# Patient Record
Sex: Male | Born: 1940 | Race: White | Hispanic: No | Smoking: Former smoker
Health system: Southern US, Community
[De-identification: ages and names within clinical notes are randomized; demographics above are authoritative.]

## PROBLEM LIST (undated history)

## (undated) DIAGNOSIS — I4510 Unspecified right bundle-branch block: Secondary | ICD-10-CM

## (undated) DIAGNOSIS — N200 Calculus of kidney: Secondary | ICD-10-CM

## (undated) DIAGNOSIS — I480 Paroxysmal atrial fibrillation: Secondary | ICD-10-CM

## (undated) DIAGNOSIS — F0390 Unspecified dementia without behavioral disturbance: Secondary | ICD-10-CM

## (undated) DIAGNOSIS — N183 Chronic kidney disease, stage 3 unspecified: Secondary | ICD-10-CM

## (undated) DIAGNOSIS — I615 Nontraumatic intracerebral hemorrhage, intraventricular: Secondary | ICD-10-CM

## (undated) DIAGNOSIS — I10 Essential (primary) hypertension: Secondary | ICD-10-CM

## (undated) DIAGNOSIS — R296 Repeated falls: Secondary | ICD-10-CM

## (undated) DIAGNOSIS — E119 Type 2 diabetes mellitus without complications: Secondary | ICD-10-CM

## (undated) DIAGNOSIS — N400 Enlarged prostate without lower urinary tract symptoms: Secondary | ICD-10-CM

## (undated) DIAGNOSIS — A419 Sepsis, unspecified organism: Secondary | ICD-10-CM

## (undated) DIAGNOSIS — E785 Hyperlipidemia, unspecified: Secondary | ICD-10-CM

## (undated) DIAGNOSIS — R011 Cardiac murmur, unspecified: Secondary | ICD-10-CM

## (undated) DIAGNOSIS — I2510 Atherosclerotic heart disease of native coronary artery without angina pectoris: Secondary | ICD-10-CM

## (undated) DIAGNOSIS — J45909 Unspecified asthma, uncomplicated: Secondary | ICD-10-CM

## (undated) DIAGNOSIS — N189 Chronic kidney disease, unspecified: Secondary | ICD-10-CM

## (undated) DIAGNOSIS — I219 Acute myocardial infarction, unspecified: Secondary | ICD-10-CM

## (undated) DIAGNOSIS — D860 Sarcoidosis of lung: Secondary | ICD-10-CM

## (undated) DIAGNOSIS — I5032 Chronic diastolic (congestive) heart failure: Secondary | ICD-10-CM

## (undated) HISTORY — PX: CORONARY ARTERY BYPASS GRAFT: SHX141

---

## 2004-07-30 ENCOUNTER — Emergency Department: Payer: Self-pay | Admitting: Emergency Medicine

## 2005-04-26 ENCOUNTER — Other Ambulatory Visit: Payer: Self-pay

## 2005-04-26 ENCOUNTER — Emergency Department: Payer: Self-pay | Admitting: Emergency Medicine

## 2005-04-27 ENCOUNTER — Other Ambulatory Visit: Payer: Self-pay

## 2005-04-27 ENCOUNTER — Inpatient Hospital Stay: Payer: Self-pay | Admitting: Internal Medicine

## 2005-04-28 ENCOUNTER — Other Ambulatory Visit: Payer: Self-pay

## 2005-04-29 ENCOUNTER — Other Ambulatory Visit: Payer: Self-pay

## 2006-03-04 ENCOUNTER — Emergency Department: Payer: Self-pay | Admitting: Unknown Physician Specialty

## 2006-04-15 ENCOUNTER — Other Ambulatory Visit: Payer: Self-pay

## 2006-04-15 ENCOUNTER — Inpatient Hospital Stay: Payer: Self-pay | Admitting: Internal Medicine

## 2006-10-21 ENCOUNTER — Emergency Department: Payer: Self-pay | Admitting: General Practice

## 2006-12-03 ENCOUNTER — Emergency Department: Payer: Self-pay | Admitting: Emergency Medicine

## 2006-12-03 ENCOUNTER — Other Ambulatory Visit: Payer: Self-pay

## 2006-12-06 ENCOUNTER — Ambulatory Visit: Payer: Self-pay | Admitting: Urology

## 2006-12-07 ENCOUNTER — Ambulatory Visit: Payer: Self-pay | Admitting: Urology

## 2006-12-18 ENCOUNTER — Ambulatory Visit: Payer: Self-pay | Admitting: Oncology

## 2006-12-28 ENCOUNTER — Ambulatory Visit: Payer: Self-pay | Admitting: Urology

## 2007-01-10 ENCOUNTER — Ambulatory Visit: Payer: Self-pay | Admitting: Oncology

## 2007-01-18 ENCOUNTER — Ambulatory Visit: Payer: Self-pay | Admitting: Oncology

## 2007-02-01 ENCOUNTER — Ambulatory Visit: Payer: Self-pay | Admitting: Oncology

## 2007-02-18 ENCOUNTER — Ambulatory Visit: Payer: Self-pay | Admitting: Oncology

## 2007-02-28 ENCOUNTER — Ambulatory Visit: Payer: Self-pay | Admitting: General Surgery

## 2007-03-06 ENCOUNTER — Inpatient Hospital Stay: Payer: Self-pay | Admitting: General Surgery

## 2007-03-20 ENCOUNTER — Ambulatory Visit: Payer: Self-pay | Admitting: Oncology

## 2007-05-12 ENCOUNTER — Emergency Department: Payer: Self-pay | Admitting: Internal Medicine

## 2008-08-03 ENCOUNTER — Ambulatory Visit: Payer: Self-pay | Admitting: Internal Medicine

## 2010-02-06 ENCOUNTER — Inpatient Hospital Stay: Payer: Self-pay | Admitting: Internal Medicine

## 2010-02-17 ENCOUNTER — Ambulatory Visit: Payer: Self-pay | Admitting: Internal Medicine

## 2010-02-18 ENCOUNTER — Ambulatory Visit: Payer: Self-pay | Admitting: Internal Medicine

## 2010-05-24 ENCOUNTER — Ambulatory Visit: Payer: Self-pay | Admitting: Internal Medicine

## 2011-01-26 ENCOUNTER — Emergency Department: Payer: Self-pay | Admitting: Emergency Medicine

## 2012-01-29 ENCOUNTER — Ambulatory Visit: Payer: Self-pay | Admitting: Internal Medicine

## 2012-02-03 ENCOUNTER — Emergency Department: Payer: Self-pay | Admitting: Emergency Medicine

## 2012-07-25 ENCOUNTER — Ambulatory Visit: Payer: Self-pay | Admitting: Internal Medicine

## 2013-09-26 ENCOUNTER — Observation Stay: Payer: Self-pay | Admitting: Student

## 2013-09-26 LAB — BASIC METABOLIC PANEL
Anion Gap: 4 — ABNORMAL LOW (ref 7–16)
BUN: 24 mg/dL — ABNORMAL HIGH (ref 7–18)
Calcium, Total: 9.1 mg/dL (ref 8.5–10.1)
Chloride: 102 mmol/L (ref 98–107)
Co2: 26 mmol/L (ref 21–32)
Creatinine: 1.2 mg/dL (ref 0.60–1.30)
EGFR (African American): >60
EGFR (Non-African Amer.): 60 — ABNORMAL LOW
Glucose: 450 mg/dL — ABNORMAL HIGH (ref 65–99)
Osmolality: 288 (ref 275–301)
Potassium: 4.3 mmol/L (ref 3.5–5.1)
Sodium: 132 mmol/L — ABNORMAL LOW (ref 136–145)

## 2013-09-26 LAB — CK TOTAL AND CKMB (NOT AT ARMC)
CK, Total: 85 U/L
CK, Total: 68 U/L
CK, Total: 58 U/L
CK-MB: 2.3 ng/mL (ref 0.5–3.6)
CK-MB: 2 ng/mL (ref 0.5–3.6)
CK-MB: 1.5 ng/mL (ref 0.5–3.6)

## 2013-09-26 LAB — CBC
HCT: 43.2 % (ref 40.0–52.0)
HGB: 14.7 g/dL (ref 13.0–18.0)
MCH: 28.1 pg (ref 26.0–34.0)
MCHC: 34 g/dL (ref 32.0–36.0)
MCV: 83 fL (ref 80–100)
Platelet: 121 10*3/uL — ABNORMAL LOW (ref 150–440)
RBC: 5.21 10*6/uL (ref 4.40–5.90)
RDW: 15.1 % — ABNORMAL HIGH (ref 11.5–14.5)
WBC: 6.1 10*3/uL (ref 3.8–10.6)

## 2013-09-26 LAB — TROPONIN I
Troponin-I: <0.02 ng/mL
Troponin-I: <0.02 ng/mL
Troponin-I: <0.02 ng/mL

## 2013-09-27 LAB — BASIC METABOLIC PANEL
Anion Gap: 3 — ABNORMAL LOW (ref 7–16)
BUN: 20 mg/dL — ABNORMAL HIGH (ref 7–18)
Calcium, Total: 9.6 mg/dL (ref 8.5–10.1)
Chloride: 102 mmol/L (ref 98–107)
Co2: 30 mmol/L (ref 21–32)
Creatinine: 1.08 mg/dL (ref 0.60–1.30)
EGFR (African American): >60
EGFR (Non-African Amer.): >60
Glucose: 211 mg/dL — ABNORMAL HIGH (ref 65–99)
Osmolality: 279 (ref 275–301)
Potassium: 5 mmol/L (ref 3.5–5.1)
Sodium: 135 mmol/L — ABNORMAL LOW (ref 136–145)

## 2013-09-27 LAB — CBC WITH DIFFERENTIAL/PLATELET
Basophil #: 0.1 10*3/uL (ref 0.0–0.1)
Basophil %: 0.9 %
Eosinophil #: 0.2 10*3/uL (ref 0.0–0.7)
Eosinophil %: 3.6 %
HCT: 42.8 % (ref 40.0–52.0)
HGB: 14.4 g/dL (ref 13.0–18.0)
Lymphocyte #: 0.8 10*3/uL — ABNORMAL LOW (ref 1.0–3.6)
Lymphocyte %: 13.6 %
MCH: 28.2 pg (ref 26.0–34.0)
MCHC: 33.7 g/dL (ref 32.0–36.0)
MCV: 84 fL (ref 80–100)
Monocyte #: 0.6 x10 3/mm (ref 0.2–1.0)
Monocyte %: 10.4 %
Neutrophil #: 4 10*3/uL (ref 1.4–6.5)
Neutrophil %: 71.5 %
Platelet: 118 10*3/uL — ABNORMAL LOW (ref 150–440)
RBC: 5.13 10*6/uL (ref 4.40–5.90)
RDW: 15.2 % — ABNORMAL HIGH (ref 11.5–14.5)
WBC: 5.6 10*3/uL (ref 3.8–10.6)

## 2013-09-27 LAB — LIPID PANEL
Cholesterol: 131 mg/dL (ref 0–200)
HDL Cholesterol: 22 mg/dL — ABNORMAL LOW (ref 40–60)
Ldl Cholesterol, Calc: 51 mg/dL (ref 0–100)
Triglycerides: 291 mg/dL — ABNORMAL HIGH (ref 0–200)
VLDL Cholesterol, Calc: 58 mg/dL — ABNORMAL HIGH (ref 5–40)

## 2013-09-27 LAB — HEMOGLOBIN A1C: Hemoglobin A1C: 8.4 % — ABNORMAL HIGH (ref 4.2–6.3)

## 2013-09-27 LAB — TSH: Thyroid Stimulating Horm: 3.84 u[IU]/mL

## 2013-09-27 LAB — MAGNESIUM: Magnesium: 1.8 mg/dL

## 2013-09-29 LAB — CBC WITH DIFFERENTIAL/PLATELET
Basophil #: 0.1 10*3/uL (ref 0.0–0.1)
Basophil %: 0.7 %
Eosinophil #: 0.2 10*3/uL (ref 0.0–0.7)
Eosinophil %: 2.1 %
HCT: 42.9 % (ref 40.0–52.0)
HGB: 15.1 g/dL (ref 13.0–18.0)
Lymphocyte #: 1 10*3/uL (ref 1.0–3.6)
Lymphocyte %: 14 %
MCH: 29 pg (ref 26.0–34.0)
MCHC: 35.1 g/dL (ref 32.0–36.0)
MCV: 83 fL (ref 80–100)
Monocyte #: 0.8 x10 3/mm (ref 0.2–1.0)
Monocyte %: 10.6 %
Neutrophil #: 5.4 10*3/uL (ref 1.4–6.5)
Neutrophil %: 72.6 %
Platelet: 127 10*3/uL — ABNORMAL LOW (ref 150–440)
RBC: 5.2 10*6/uL (ref 4.40–5.90)
RDW: 15.2 % — ABNORMAL HIGH (ref 11.5–14.5)
WBC: 7.4 10*3/uL (ref 3.8–10.6)

## 2013-09-29 LAB — BASIC METABOLIC PANEL
Anion Gap: 4 — ABNORMAL LOW (ref 7–16)
BUN: 19 mg/dL — ABNORMAL HIGH (ref 7–18)
Calcium, Total: 9.2 mg/dL (ref 8.5–10.1)
Chloride: 101 mmol/L (ref 98–107)
Co2: 30 mmol/L (ref 21–32)
Creatinine: 1.19 mg/dL (ref 0.60–1.30)
EGFR (African American): >60
EGFR (Non-African Amer.): >60
Glucose: 201 mg/dL — ABNORMAL HIGH (ref 65–99)
Osmolality: 278 (ref 275–301)
Potassium: 4.7 mmol/L (ref 3.5–5.1)
Sodium: 135 mmol/L — ABNORMAL LOW (ref 136–145)

## 2014-02-06 ENCOUNTER — Ambulatory Visit: Payer: Self-pay | Admitting: Internal Medicine

## 2014-05-21 ENCOUNTER — Ambulatory Visit: Payer: Self-pay | Admitting: Specialist

## 2014-10-10 NOTE — Discharge Summary (Signed)
PATIENT NAME:  Peter Robertson, Peter Robertson MR#:  [id number] DATE OF BIRTH:  [birth year]  DATE OF ADMISSION:  09/26/2013 DATE OF DISCHARGE: 09/29/2013.   CONSULTANTS: Dr. Nehemiah Massed from cardiology.   PRIMARY CARE PHYSICIAN: Dr. Benita Stabile.   CHIEF COMPLAINT: Chest pain.   DISCHARGE DIAGNOSES: 1. Chest pain with coronary artery disease but now appears to be noncardiac, resolved.  2. Uncontrolled diabetes.  3. Hypertension.  4. History of coronary artery disease.  5. History of myocardial infarction with stent placement in the past.  6. Hyperlipidemia.  7. Gastroesophageal reflux disease.  8. Benign prostatic hypertrophy.   DISCHARGE MEDICATIONS: Imdur 30 mg once a day, metoprolol for heart rate 25 mg 1/2 tab 2 times a day, atorvastatin 20 mg daily, aspirin 81 mg daily, metformin 500  mg 2 tabs 2 times a day, please start in 48 hours, glipizide 2.5 mg once a day after a meal in the mornings.   DIET: Low-sodium, low-fat, low-cholesterol ADA diet.   ACTIVITY: As tolerated.   FOLLOWUP: Please follow with PCP within 1 to 2 weeks. Please follow with cardiology within a week, an appointment will be made for you.   DISPOSITION: Home.   SIGNIFICANT LABS AND IMAGING: Troponins were negative x 3. CK-MB within normal limits x 3. Initial sugars were 450. Hemoglobin A1c of 8.4, triglycerides of 291. TSH 3.84. Echocardiogram showing ejection fraction of 55% to 60%.   HISTORY OF PRESENT ILLNESS AND HOSPITAL COURSE: For full details of H and P, please see the dictation on 04/10 by Dr. Bridgette Habermann, but briefly this is a pleasant 74 year old with history of coronary artery disease status post myocardial infarction and CABG in the past, who was sent in by Dr. Clayborn Bigness for further evaluation to the ER after he had complained of some chest pain off and on for the last couple of days prior to admission. He had occasional dizziness and shortness of breath with this pain. He was admitted to telemetry and was ruled out for acute  coronary syndrome. There was an atypical element to his chest pain as he was tender to palpation around the heart area without evidence for cellulitis, trauma and the pain was reproducible, but given the history of coronary artery disease, myocardial infarction and CABG and uncontrolled diabetes he underwent a cardiac catheter on 04/13. Although no official cath report is up,  per Dr. Alveria Apley note, ejection fraction appears to be about 55% and patent LIMA to LAD and no significant stenosis of RCA or circ. Graft to marginal artery appears to be occluded but likely not a need to do anything for it due to a patent left circumflex artery per his notes. He will be followed as an outpatient with Dr. Nehemiah Massed. Would discharge him on Imdur and aspirin. In regards to his uncontrolled diabetes, he will be discharged on glipizide as he had cath he was instructed not to take metformin for 48 hours. I suspect that there might be an element of musculoskeletal component given the reproducibility of this chest pain, but I do not think it is cardiac at this point.   PHYSICAL EXAMINATION: VITAL SIGNS: On the day of discharge he did not have any shortness of breath or pains in the chest. His vitals are as follows: Temperature 98.3, pulse rate 80, respiratory rate 16, blood pressure 133/78, oxygen saturation 97%.  GENERAL: The patient well-developed male lying in bed, no obvious distress. Normal S1, S2.  LUNGS: Clear.  ABDOMEN: Soft, nontender.  EXTREMITIES: He does not appear to  have any lower extremity edema.   ____________________________ Vivien Presto, MD sa:sg D: 09/29/2013 11:15:00 ET T: 09/29/2013 11:47:01 ET JOB#: [id number]  cc: Vivien Presto, MD, <Dictator> Leona Carry. Hall Busing, MD Dwayne D. Clayborn Bigness, MD Corey Skains, MD  Vivien Presto MD ELECTRONICALLY SIGNED 10/16/2013 14:24

## 2014-10-10 NOTE — H&P (Signed)
PATIENT NAME:  Peter Robertson, Peter Robertson MR#:  [id number] DATE OF BIRTH:  06/01/1941  DATE OF ADMISSION:  09/26/2013  REFERRING PHYSICIAN: Dr. Joni Fears  PRIMARY CARDIOLOGIST: Dr. Nehemiah Massed  PRIMARY CARE PHYSICIAN: Dr. Benita Stabile  CHIEF COMPLAINT: Chest pain.   HISTORY OF PRESENT ILLNESS: The patient is a pleasant 74 year old male with history of CAD status post CABG, previous MI in the past, who has been having chest pain off and on for the last couple of days. Was seen by Dr. Clayborn Bigness today who referred the patient to come here for further evaluation. The patient states the pain has no significant radiation and it is around the chest area. He has occasional dizziness and shortness of breath with this pain. Sitting makes the patient's pain worse. Currently the pain is improved. He has had episodes of diaphoresis as well. Here in the ER he had initial work-up showing glucose of 450 and a troponin being negative and hospitalist services were contacted for further evaluation and management.   PAST MEDICAL HISTORY: CAD status post MI and stent placement in the past and also CABG a few years ago, hyperlipidemia, hypertension, diabetes, GERD, BPH.  ALLERGIES: No known drug allergies, but he has allergy to TAPE.  SOCIAL HISTORY: Remote tobacco abuse history in the past. No alcohol or drug use. He is married.   FAMILY HISTORY: Diabetes and CAD running in the family.   OUTPATIENT MEDICATIONS: He is on 5 medications per him and the patient medications have not been reconciled, but there is a list from 2011 and he states that the medications are exactly the same as before. At that time he was on Plavix 75 mg daily, omeprazole 20 mg b.i.d., Toprol-XL 50 mg daily, Zestril 5 mg daily, Imdur 30 mg daily, Lipitor 20 mg daily. He does take a baby aspirin as well. At that time he was on Actos plus metformin. He thinks he is on Metformin, does not know about Actos component at this time.   REVIEW OF SYSTEMS: CONSTITUTIONAL:  No fever, fatigue, weight changes.  EYES: No blurry vision or double vision.  ENT: No tinnitus or hearing loss.  RESPIRATORY: No cough, wheezing, or painful respirations. No COPD. CARDIOVASCULAR: Chest pain as above. No orthopnea. He does have chronic dyspnea on exertion. No arrhythmia. GASTROINTESTINAL: No nausea, vomiting, diarrhea, abdominal pain, black or tarry stools.  GENITOURINARY: Denies dysuria or hematuria.  HEMATOLOGIC AND LYMPHATIC: No anemia or easy bruising.  SKIN: No rashes.  MUSCULOSKELETAL: He does have left-sided chest pain on palpation, per him. No trauma or falls.  NEUROLOGIC: No focal weakness or numbness.  PSYCHIATRIC: No anxiety or depression.   PHYSICAL EXAMINATION: VITAL SIGNS: Temperature 98.1, pulse rate 87, respiratory rate 18, blood pressure 144/70, O2 sat 96% on room air.  GENERAL: The patient is a well-developed male lying in bed, no obvious distress.  HEENT: Normocephalic, atraumatic. Pupils are equal and reactive. Anicteric sclerae. Moist mucous membranes.  NECK: Supple. No thyroid tenderness. No cervical lymphadenopathy.  HEART: S1 and S2 regular. No significant murmurs are appreciated. He has healed mid sternal scar from previous surgery. He does have significant tenderness to palpation on left anterior chest. There is no evidence for redness, drainage, evidence of cellulitis.  ABDOMEN: Soft, nontender, nondistended. Positive bowel sounds in all quadrants.  EXTREMITIES: No pitting edema.  NEUROLOGIC: Cranial nerves II through XII grossly intact. Strength is 5/5 in all extremities. Sensation is intact to light touch.  PSYCHIATRIC: Awake, alert and oriented x3.   DIAGNOSTIC DATA:  Glucose 450, BUN 24, creatinine 1.2, sodium 132, potassium 4.3. Troponin negative. White count 6.1, hemoglobin 14.7, platelets 121,000.   EKG: Normal sinus rhythm, rate is 84. There is a right bundle branch block. No acute ST elevations or depressions compared to EKG in 2011. There  are some T wave inversions in V1 to V3 that appear similar to prior.   X-ray of the chest, PA and lateral, is showing chronic changes of sarcoid. No superimposed acute abnormalities.   ASSESSMENT AND PLAN: We have a pleasant 74 year old with history of coronary artery disease status post myocardial infarction and CABG, diabetes, hypertension, and gastroesophageal reflux disease who comes in with off-and-on chest pain with typical and atypical features. He has some dizziness, shortness of breath and episodes of diaphoresis with this; however, when I press on the anterolateral chest on the left, corresponding to the area of chest pain, he says this is exactly the same chest pain. As this is reproducible, it is pretty much atypical and not compatible with acute coronary syndrome; however, the patient does have episodes of dizziness, shortness of breath and diaphoresis per him, and he was referred here by Dr. Clayborn Bigness. At this point would admit him to the hospital and rule out acute coronary syndrome. Would start him on his aspirin, Plavix, beta blocker, statin. We would get a full med rec from his pharmacy. Would put on a nitro patch as well as morphine and obtain an echocardiogram as there is no recent echocardiogram on file. Would also obtain a cardiology consult. He will be admitted to telemetry with cyclic troponins, TSH, as well as hemoglobin A1c and lipid profile. In regards to his diabetes medications, I would put him on a sliding scale insulin for now, check a Hemoglobin A1c.  I will also put him on heparin for deep vein thrombosis prophylaxis and proton pump inhibitor for gastrointestinal prophylaxis.   CODE STATUS: FULL.  TOTAL TIME SPENT: 45 minutes.   ____________________________ Vivien Presto, MD sa:sb D: 09/26/2013 16:14:29 ET T: 09/26/2013 16:51:00 ET JOB#: [id number]  cc: Vivien Presto, MD, <Dictator> Leona Carry. Hall Busing, MD Corey Skains, MD Vivien Presto MD ELECTRONICALLY SIGNED  10/16/2013 14:24

## 2014-10-10 NOTE — Consult Note (Signed)
PATIENT NAME:  Peter Robertson, Peter Robertson MR#:  [id number] DATE OF BIRTH:  Jul 07, 1940  DATE OF CONSULTATION:  09/27/2013  CONSULTING PHYSICIAN:  Corey Skains, MD  REASON FOR CONSULTATION: Unstable angina with known coronary artery disease, diabetes, hypertension, and hyperlipidemia.   CHIEF COMPLAINT: "I chest pain."   HISTORY OF PRESENT ILLNESS:  74 year old male with known CAD, status post coronary artery bypass graft, diabetes, hypertension and hyperlipidemia on appropriate medication management with acute onset of  left upper chest discomfort consistent with softball type pressure. This is associated with shortness of breath and physical activity, relieved by rest. This is slowly and significantly increasing in frequency and/or intensity at this time over the last several weeks. He was seen in the Emergency Room with continued chest pain but no evidence of new EKG changes. The patient did have a normal troponin and CK-MB. Currently, he is still having some mild amount of chest discomfort.   REMAINDER OF REVIEW OF SYSTEMS:  Negative for vision change, ringing in the ears, hearing loss, cough, congestion, heartburn, nausea, vomiting, diarrhea, bloody stools, stomach pain, extremity pain, leg weakness, cramping of the buttocks, known blood clots, headaches, blackouts and dizziness, nosebleeds, congestion, trouble swallowing, frequent urination, urination at night, muscle weakness, numbness, anxiety, depression, skin lesions or skin rashes.   PAST MEDICAL HISTORY:  1.  Diabetes.  2.  Hypertension.  3.  Hyperlipidemia.   FAMILY HISTORY: No family members with early onset of cardiovascular disease or hypertension.   SOCIAL HISTORY: Currently denies alcohol or tobacco use.   ALLERGIES: As listed.   MEDICATIONS: As listed.   PHYSICAL EXAMINATION:  VITAL SIGNS: Blood pressure 126/68 bilaterally, heart rate 72 upright, reclining, and regular.  GENERAL: He is a well-appearing male in no acute distress.   HEENT: No icterus, thyromegaly, ulcers, hemorrhage, or xanthelasma.  CARDIOVASCULAR: Regular rate and rhythm. Normal S1 and S2 without murmur, gallop, or rub. PMI is normal size and placement. Carotid upstroke normal without bruit. Jugular venous pressure is normal.  LUNGS: Have few basilar crackles with normal respirations.  ABDOMEN: Soft, nontender, without hepatosplenomegaly or masses. Abdominal aorta is normal size without bruit.  EXTREMITIES: 2+ bilateral pulses in dorsal, pedal, radial and femoral arteries without lower extremity edema, signs of clubbing, or ulcers.  NEUROLOGIC: He is oriented to time, place, and person, with normal mood and affect.   ASSESSMENT: 74 year old male with known CAD, hypertension, hyperlipidemia with acute unstable angina without evidence of myocardial infarction or EKG changes.   RECOMMENDATIONS:  1.  Continue serial ECG and enzymes to assess for possible myocardial infarction.  2.  Continue nitrates and possible convert to isosorbide for chest discomfort.  3.  Continue current medical regimen for risk factors for risk reduction in cardiovascular disease including diabetic medication management, and hypertension management and lipid management.  4.  Ambulation and follow for any further significant symptoms and possible discharge to home with possible cardiac catheterization as outpatient versus if the patient continues to have chest discomfort would proceed to cardiac catheterization to assess coronary anatomy. The patient understands the risks and benefits of cardiac catheterization. This includes the possibility of death, stroke, heart attack, infection, bleeding, or blood clot. He is at low risk for conscious sedation.     ____________________________ Corey Skains, MD bjk:tc D: 09/27/2013 11:03:25 ET T: 09/27/2013 11:28:10 ET JOB#: [id number]  cc: Corey Skains, MD, <Dictator> Corey Skains MD ELECTRONICALLY SIGNED 09/30/2013 7:50

## 2014-12-02 ENCOUNTER — Encounter: Payer: Self-pay | Admitting: *Deleted

## 2014-12-03 ENCOUNTER — Ambulatory Visit: Payer: Medicare PPO | Admitting: Anesthesiology

## 2014-12-03 ENCOUNTER — Encounter
Admission: RE | Disposition: A | Discharge status: Home / Self Care | Payer: Self-pay | Source: Ambulatory Visit | Attending: Unknown Physician Specialty

## 2014-12-03 ENCOUNTER — Ambulatory Visit
Admission: RE | Admit: 2014-12-03 | Discharge: 2014-12-03 | Disposition: A | Discharge status: Home / Self Care | Payer: Medicare PPO | Source: Ambulatory Visit | Attending: Unknown Physician Specialty | Admitting: Unknown Physician Specialty

## 2014-12-03 ENCOUNTER — Encounter: Payer: Self-pay | Admitting: *Deleted

## 2014-12-03 DIAGNOSIS — I2510 Atherosclerotic heart disease of native coronary artery without angina pectoris: Secondary | ICD-10-CM | POA: Diagnosis not present

## 2014-12-03 DIAGNOSIS — Z1211 Encounter for screening for malignant neoplasm of colon: Secondary | ICD-10-CM | POA: Diagnosis not present

## 2014-12-03 DIAGNOSIS — E119 Type 2 diabetes mellitus without complications: Secondary | ICD-10-CM | POA: Diagnosis not present

## 2014-12-03 DIAGNOSIS — I129 Hypertensive chronic kidney disease with stage 1 through stage 4 chronic kidney disease, or unspecified chronic kidney disease: Secondary | ICD-10-CM | POA: Diagnosis not present

## 2014-12-03 DIAGNOSIS — Z79899 Other long term (current) drug therapy: Secondary | ICD-10-CM | POA: Insufficient documentation

## 2014-12-03 DIAGNOSIS — Z955 Presence of coronary angioplasty implant and graft: Secondary | ICD-10-CM | POA: Insufficient documentation

## 2014-12-03 DIAGNOSIS — N400 Enlarged prostate without lower urinary tract symptoms: Secondary | ICD-10-CM | POA: Diagnosis not present

## 2014-12-03 DIAGNOSIS — I252 Old myocardial infarction: Secondary | ICD-10-CM | POA: Diagnosis not present

## 2014-12-03 DIAGNOSIS — D125 Benign neoplasm of sigmoid colon: Secondary | ICD-10-CM | POA: Diagnosis not present

## 2014-12-03 DIAGNOSIS — Z87442 Personal history of urinary calculi: Secondary | ICD-10-CM | POA: Diagnosis not present

## 2014-12-03 DIAGNOSIS — D124 Benign neoplasm of descending colon: Secondary | ICD-10-CM | POA: Diagnosis not present

## 2014-12-03 DIAGNOSIS — D123 Benign neoplasm of transverse colon: Secondary | ICD-10-CM | POA: Diagnosis not present

## 2014-12-03 DIAGNOSIS — J45909 Unspecified asthma, uncomplicated: Secondary | ICD-10-CM | POA: Insufficient documentation

## 2014-12-03 DIAGNOSIS — Z7982 Long term (current) use of aspirin: Secondary | ICD-10-CM | POA: Insufficient documentation

## 2014-12-03 DIAGNOSIS — Z87891 Personal history of nicotine dependence: Secondary | ICD-10-CM | POA: Diagnosis not present

## 2014-12-03 DIAGNOSIS — E785 Hyperlipidemia, unspecified: Secondary | ICD-10-CM | POA: Diagnosis not present

## 2014-12-03 DIAGNOSIS — N189 Chronic kidney disease, unspecified: Secondary | ICD-10-CM | POA: Insufficient documentation

## 2014-12-03 HISTORY — DX: Acute myocardial infarction, unspecified: I21.9

## 2014-12-03 HISTORY — DX: Benign prostatic hyperplasia without lower urinary tract symptoms: N40.0

## 2014-12-03 HISTORY — DX: Atherosclerotic heart disease of native coronary artery without angina pectoris: I25.10

## 2014-12-03 HISTORY — PX: COLONOSCOPY WITH PROPOFOL: SHX5780

## 2014-12-03 HISTORY — DX: Essential (primary) hypertension: I10

## 2014-12-03 HISTORY — DX: Unspecified asthma, uncomplicated: J45.909

## 2014-12-03 HISTORY — DX: Hyperlipidemia, unspecified: E78.5

## 2014-12-03 HISTORY — DX: Type 2 diabetes mellitus without complications: E11.9

## 2014-12-03 HISTORY — DX: Cardiac murmur, unspecified: R01.1

## 2014-12-03 HISTORY — DX: Chronic kidney disease, unspecified: N18.9

## 2014-12-03 LAB — GLUCOSE, CAPILLARY: Glucose-Capillary: 282 mg/dL — ABNORMAL HIGH (ref 65–99)

## 2014-12-03 SURGERY — COLONOSCOPY WITH PROPOFOL
Anesthesia: General

## 2014-12-03 MED ORDER — SODIUM CHLORIDE 0.9 % IV SOLN
INTRAVENOUS | Status: DC
  Administered 2014-12-03: 09:00:00 via INTRAVENOUS

## 2014-12-03 MED ORDER — PROPOFOL INFUSION 10 MG/ML OPTIME
INTRAVENOUS | Status: DC | PRN
  Administered 2014-12-03: 75 ug/kg/min via INTRAVENOUS

## 2014-12-03 MED ORDER — PROPOFOL 10 MG/ML IV BOLUS
INTRAVENOUS | Status: DC | PRN
  Administered 2014-12-03: 50 mg via INTRAVENOUS

## 2014-12-03 MED ORDER — LACTATED RINGERS IV SOLN
INTRAVENOUS | Status: DC | PRN
Start: 2014-12-03 — End: 2014-12-03
  Administered 2014-12-03: 10:00:00 via INTRAVENOUS

## 2014-12-03 NOTE — H&P (Signed)
Primary Care Physician:  Albina Billet, MD Primary Gastroenterologist:  Dr. Vira Agar  Pre-Procedure History & Physical: HPI:  Peter Robertson is a 74 y.o. male is here for an colonoscopy.   Past Medical History  Diagnosis Date  . Hypertension   . Diabetes mellitus without complication   . Hyperlipidemia   . Coronary artery disease   . Asthma   . Heart murmur   . Myocardial infarction   . BPH (benign prostatic hyperplasia)   . Chronic kidney disease     Kidney Stones    Past Surgical History  Procedure Laterality Date  . Coronary artery bypass graft      Prior to Admission medications   Medication Sig Start Date End Date Taking? Authorizing Provider  acetaminophen (TYLENOL) 325 MG tablet Take 650 mg by mouth every 6 (six) hours as needed for headache.   Yes Historical Provider, MD  aspirin 81 MG tablet Take 81 mg by mouth daily.   Yes Historical Provider, MD  atorvastatin (LIPITOR) 20 MG tablet Take 20 mg by mouth daily.   Yes Historical Provider, MD  glimepiride (AMARYL) 2 MG tablet Take 2 mg by mouth daily with breakfast.   Yes Historical Provider, MD  isosorbide mononitrate (IMDUR) 30 MG 24 hr tablet Take 30 mg by mouth daily.   Yes Historical Provider, MD  lisinopril (PRINIVIL,ZESTRIL) 5 MG tablet Take 5 mg by mouth daily.   Yes Historical Provider, MD  metFORMIN (GLUCOPHAGE) 500 MG tablet Take 1,000 mg by mouth 2 (two) times daily with a meal.   Yes Historical Provider, MD  metoprolol tartrate (LOPRESSOR) 25 MG tablet Take 12.5 mg by mouth 2 (two) times daily.   Yes Historical Provider, MD  albuterol (PROVENTIL HFA;VENTOLIN HFA) 108 (90 BASE) MCG/ACT inhaler Inhale 2 puffs into the lungs every 6 (six) hours as needed for wheezing or shortness of breath.    Historical Provider, MD    Allergies as of 10/21/2014  . (Not on File)    History reviewed. No pertinent family history.  History   Social History  . Marital Status: Married    Spouse Name: N/A  . Number of  Children: N/A  . Years of Education: N/A   Occupational History  . Not on file.   Social History Main Topics  . Smoking status: Former Smoker    Quit date: 06/19/1984  . Smokeless tobacco: Never Used  . Alcohol Use: No  . Drug Use: No  . Sexual Activity: Not on file   Other Topics Concern  . Not on file   Social History Narrative    Review of Systems: See HPI, otherwise negative ROS  Physical Exam: BP 100/80 mmHg  Pulse 82  Temp(Src) 97.9 F (36.6 C) (Tympanic)  Resp 16  Ht 5\' 6"  (1.676 m)  Wt 104.327 kg (230 lb)  BMI 37.14 kg/m2  SpO2 97% General:   Alert,  pleasant and cooperative in NAD Head:  Normocephalic and atraumatic. Neck:  Supple; no masses or thyromegaly. Lungs:  Clear throughout to auscultation.    Heart:  Regular rate and rhythm. Abdomen:  Soft, nontender and nondistended. Normal bowel sounds, without guarding, and without rebound.   Neurologic:  Alert and  oriented x4;  grossly normal neurologically.  Impression/Plan: Peter Robertson is here for an colonoscopy to be performed for screening colonoscopy  Risks, benefits, limitations, and alternatives regarding  colonoscopy have been reviewed with the patient.  Questions have been answered.  All parties agreeable.  Gaylyn Cheers, MD  12/03/2014, 10:14 AM   Primary Care Physician:  Albina Billet, MD Primary Gastroenterologist:  Dr. Vira Agar  Pre-Procedure History & Physical: HPI:  Peter Robertson is a 74 y.o. male is here for an colonoscopy.   Past Medical History  Diagnosis Date  . Hypertension   . Diabetes mellitus without complication   . Hyperlipidemia   . Coronary artery disease   . Asthma   . Heart murmur   . Myocardial infarction   . BPH (benign prostatic hyperplasia)   . Chronic kidney disease     Kidney Stones    Past Surgical History  Procedure Laterality Date  . Coronary artery bypass graft      Prior to Admission medications   Medication Sig Start Date End Date Taking?  Authorizing Provider  acetaminophen (TYLENOL) 325 MG tablet Take 650 mg by mouth every 6 (six) hours as needed for headache.   Yes Historical Provider, MD  aspirin 81 MG tablet Take 81 mg by mouth daily.   Yes Historical Provider, MD  atorvastatin (LIPITOR) 20 MG tablet Take 20 mg by mouth daily.   Yes Historical Provider, MD  glimepiride (AMARYL) 2 MG tablet Take 2 mg by mouth daily with breakfast.   Yes Historical Provider, MD  isosorbide mononitrate (IMDUR) 30 MG 24 hr tablet Take 30 mg by mouth daily.   Yes Historical Provider, MD  lisinopril (PRINIVIL,ZESTRIL) 5 MG tablet Take 5 mg by mouth daily.   Yes Historical Provider, MD  metFORMIN (GLUCOPHAGE) 500 MG tablet Take 1,000 mg by mouth 2 (two) times daily with a meal.   Yes Historical Provider, MD  metoprolol tartrate (LOPRESSOR) 25 MG tablet Take 12.5 mg by mouth 2 (two) times daily.   Yes Historical Provider, MD  albuterol (PROVENTIL HFA;VENTOLIN HFA) 108 (90 BASE) MCG/ACT inhaler Inhale 2 puffs into the lungs every 6 (six) hours as needed for wheezing or shortness of breath.    Historical Provider, MD    Allergies as of 10/21/2014  . (Not on File)    History reviewed. No pertinent family history.  History   Social History  . Marital Status: Married    Spouse Name: N/A  . Number of Children: N/A  . Years of Education: N/A   Occupational History  . Not on file.   Social History Main Topics  . Smoking status: Former Smoker    Quit date: 06/19/1984  . Smokeless tobacco: Never Used  . Alcohol Use: No  . Drug Use: No  . Sexual Activity: Not on file   Other Topics Concern  . Not on file   Social History Narrative    Review of Systems: See HPI, otherwise negative ROS  Physical Exam: BP 100/80 mmHg  Pulse 82  Temp(Src) 97.9 F (36.6 C) (Tympanic)  Resp 16  Ht 5\' 6"  (1.676 m)  Wt 104.327 kg (230 lb)  BMI 37.14 kg/m2  SpO2 97% General:   Alert,  pleasant and cooperative in NAD Head:  Normocephalic and  atraumatic. Neck:  Supple; no masses or thyromegaly. Lungs:  Clear throughout to auscultation.    Heart:  Regular rate and rhythm. Abdomen:  Soft, nontender and nondistended. Normal bowel sounds, without guarding, and without rebound.   Neurologic:  Alert and  oriented x4;  grossly normal neurologically.  Impression/Plan: Peter Robertson is here for an colonoscopy to be performed for screening  Risks, benefits, limitations, and alternatives regarding  colonoscopy have been reviewed with the patient.  Questions have  been answered.  All parties agreeable.   Gaylyn Cheers, MD  12/03/2014, 10:14 AM

## 2014-12-03 NOTE — Anesthesia Preprocedure Evaluation (Addendum)
Anesthesia Evaluation  Patient identified by MRN, date of birth, ID band Patient awake    Reviewed: Allergy & Precautions, NPO status , Patient's Chart, lab work & pertinent test results, reviewed documented beta blocker date and time   Airway Mallampati: II  TM Distance: >3 FB     Dental  (+) Partial Lower, Chipped, Poor Dentition   Pulmonary asthma , former smoker,          Cardiovascular hypertension, + CAD and + Past MI + Valvular Problems/Murmurs     Neuro/Psych    GI/Hepatic   Endo/Other  diabetes  Renal/GU Renal InsufficiencyRenal disease     Musculoskeletal   Abdominal   Peds  Hematology   Anesthesia Other Findings Only 4 teeth on top. CABG.  Reproductive/Obstetrics                            Anesthesia Physical Anesthesia Plan  ASA: III  Anesthesia Plan: General   Post-op Pain Management:    Induction: Intravenous  Airway Management Planned: Nasal Cannula  Additional Equipment:   Intra-op Plan:   Post-operative Plan:   Informed Consent: I have reviewed the patients History and Physical, chart, labs and discussed the procedure including the risks, benefits and alternatives for the proposed anesthesia with the patient or authorized representative who has indicated his/her understanding and acceptance.     Plan Discussed with: CRNA  Anesthesia Plan Comments:         Anesthesia Quick Evaluation

## 2014-12-03 NOTE — Transfer of Care (Signed)
Immediate Anesthesia Transfer of Care Note  Patient: Peter Robertson  Procedure(s) Performed: Procedure(s): COLONOSCOPY WITH PROPOFOL (N/A)  Patient Location: PACU and Endoscopy Unit  Anesthesia Type:General  Level of Consciousness: awake  Airway & Oxygen Therapy: Patient Spontanous Breathing and Patient connected to nasal cannula oxygen  Post-op Assessment: Report given to RN  Post vital signs: stable  Last Vitals:  Filed Vitals:   12/03/14 0829  BP: 100/80  Pulse: 82  Temp: 36.6 C  Resp: 16    Complications: No apparent anesthesia complications

## 2014-12-03 NOTE — Anesthesia Postprocedure Evaluation (Incomplete)
  Anesthesia Post-op Note  Patient: Peter Robertson  Procedure(s) Performed: Procedure(s): COLONOSCOPY WITH PROPOFOL (N/A)  Anesthesia type:General  Patient location: PACU  Post pain: Pain level controlled  Post assessment: Post-op Vital signs reviewed, Patient's Cardiovascular Status Stable, Respiratory Function Stable, Patent Airway and No signs of Nausea or vomiting  Post vital signs: Reviewed and stable  Last Vitals:  Filed Vitals:   12/03/14 0829  BP: 100/80  Pulse: 82  Temp: 36.6 C  Resp: 16    Level of consciousness: awake, alert  and patient cooperative  Complications: No apparent anesthesia complications

## 2014-12-03 NOTE — Anesthesia Postprocedure Evaluation (Signed)
  Anesthesia Post-op Note  Patient: Peter Robertson  Procedure(s) Performed: Procedure(s): COLONOSCOPY WITH PROPOFOL (N/A)  Anesthesia type:General  Patient location: PACU  Post pain: Pain level controlled  Post assessment: Post-op Vital signs reviewed, Patient's Cardiovascular Status Stable, Respiratory Function Stable, Patent Airway and No signs of Nausea or vomiting  Post vital signs: Reviewed and stable  Last Vitals:  Filed Vitals:   12/03/14 1140  BP: 139/76  Pulse: 67  Temp:   Resp:     Level of consciousness: awake, alert  and patient cooperative  Complications: No apparent anesthesia complications

## 2014-12-03 NOTE — Op Note (Signed)
[facility] Patient Name: Peter Robertson Procedure Date: 12/03/2014 10:12 AM MRN: [id number] Account #: [id number] Date of Birth: 07-24-1940 Admit Type: Outpatient Age: 74 Room: [facility] ENDO ROOM 1 Gender: Male Note Status: Finalized Procedure:         Colonoscopy Indications:       Screening for colorectal malignant neoplasm Providers:         Manya Silvas, MD Referring MD:      Leona Carry. Hall Busing, MD (Referring MD) Medicines:         Propofol per Anesthesia Complications:     No immediate complications. Procedure:         Pre-Anesthesia Assessment:                    - After reviewing the risks and benefits, the patient was                     deemed in satisfactory condition to undergo the procedure.                    After obtaining informed consent, the colonoscope was                     passed under direct vision. Throughout the procedure, the                     patient's blood pressure, pulse, and oxygen saturations                     were monitored continuously. The Olympus PCF-160AL                     colonoscope (S#. [id number]) was introduced through the anus                     and advanced to the the cecum, identified by appendiceal                     orifice and ileocecal valve. The colonoscopy was performed                     without difficulty. The patient tolerated the procedure                     well. The quality of the bowel preparation was adequate to                     identify polyps. Findings:      A small polyp was found in the recto-sigmoid colon. The polyp was       sessile. The polyp was removed with a cold snare. Resection and       retrieval were complete.      Four sessile polyps were found in the transverse colon. The polyps were       diminutive in size. These polyps were removed with a jumbo cold forceps.       Resection and retrieval were complete.      A diminutive polyp was found in the descending colon. The  polyp was       sessile. The polyp was removed with a jumbo cold forceps. Resection and       retrieval were complete.      A small polyp was found in the sigmoid colon. The polyp was sessile. The  polyp was removed with a jumbo cold forceps. Resection and retrieval       were complete. Impression:        - One small polyp at the recto-sigmoid colon. Resected and                     retrieved.                    - Four diminutive polyps in the transverse colon. Resected                     and retrieved.                    - One diminutive polyp in the descending colon. Resected                     and retrieved.                    - One small polyp in the sigmoid colon. Resected and                     retrieved. Recommendation:    - Await pathology results. Manya Silvas, MD 12/03/2014 10:54:31 AM This report has been signed electronically. Number of Addenda: 0 Note Initiated On: 12/03/2014 10:12 AM Scope Withdrawal Time: 0 hours 13 minutes 59 seconds  Total Procedure Duration: 0 hours 26 minutes 1 second       [facility]

## 2014-12-04 LAB — SURGICAL PATHOLOGY

## 2014-12-14 ENCOUNTER — Encounter: Payer: Self-pay | Admitting: Unknown Physician Specialty

## 2014-12-15 NOTE — Addendum Note (Signed)
Addendum  created 12/15/14 1615 by Gunnar Bulla, MD   Modules edited: Anesthesia Attestations

## 2015-12-01 ENCOUNTER — Encounter: Payer: Self-pay | Admitting: *Deleted

## 2015-12-01 ENCOUNTER — Emergency Department
Admission: EM | Admit: 2015-12-01 | Discharge: 2015-12-01 | Disposition: A | Discharge status: Home / Self Care | Payer: Medicare Other | Attending: Emergency Medicine | Admitting: Emergency Medicine

## 2015-12-01 ENCOUNTER — Emergency Department: Payer: Medicare Other

## 2015-12-01 DIAGNOSIS — E119 Type 2 diabetes mellitus without complications: Secondary | ICD-10-CM | POA: Diagnosis not present

## 2015-12-01 DIAGNOSIS — Z87891 Personal history of nicotine dependence: Secondary | ICD-10-CM | POA: Diagnosis not present

## 2015-12-01 DIAGNOSIS — Z7984 Long term (current) use of oral hypoglycemic drugs: Secondary | ICD-10-CM | POA: Insufficient documentation

## 2015-12-01 DIAGNOSIS — R1032 Left lower quadrant pain: Secondary | ICD-10-CM

## 2015-12-01 DIAGNOSIS — I129 Hypertensive chronic kidney disease with stage 1 through stage 4 chronic kidney disease, or unspecified chronic kidney disease: Secondary | ICD-10-CM | POA: Diagnosis not present

## 2015-12-01 DIAGNOSIS — K862 Cyst of pancreas: Secondary | ICD-10-CM | POA: Diagnosis not present

## 2015-12-01 DIAGNOSIS — Z79899 Other long term (current) drug therapy: Secondary | ICD-10-CM | POA: Insufficient documentation

## 2015-12-01 DIAGNOSIS — J45909 Unspecified asthma, uncomplicated: Secondary | ICD-10-CM | POA: Diagnosis not present

## 2015-12-01 DIAGNOSIS — I2510 Atherosclerotic heart disease of native coronary artery without angina pectoris: Secondary | ICD-10-CM | POA: Insufficient documentation

## 2015-12-01 DIAGNOSIS — R590 Localized enlarged lymph nodes: Secondary | ICD-10-CM | POA: Insufficient documentation

## 2015-12-01 DIAGNOSIS — N189 Chronic kidney disease, unspecified: Secondary | ICD-10-CM | POA: Diagnosis not present

## 2015-12-01 DIAGNOSIS — I252 Old myocardial infarction: Secondary | ICD-10-CM | POA: Insufficient documentation

## 2015-12-01 DIAGNOSIS — Z7982 Long term (current) use of aspirin: Secondary | ICD-10-CM | POA: Diagnosis not present

## 2015-12-01 LAB — GLUCOSE, CAPILLARY: Glucose-Capillary: 320 mg/dL — ABNORMAL HIGH (ref 65–99)

## 2015-12-01 LAB — CBC
HCT: 38.2 % — ABNORMAL LOW (ref 40.0–52.0)
Hemoglobin: 13.4 g/dL (ref 13.0–18.0)
MCH: 29.4 pg (ref 26.0–34.0)
MCHC: 35 g/dL (ref 32.0–36.0)
MCV: 84 fL (ref 80.0–100.0)
Platelets: 165 10*3/uL (ref 150–440)
RBC: 4.55 MIL/uL (ref 4.40–5.90)
RDW: 15.1 % — ABNORMAL HIGH (ref 11.5–14.5)
WBC: 7.7 10*3/uL (ref 3.8–10.6)

## 2015-12-01 LAB — BASIC METABOLIC PANEL
Anion gap: 10 (ref 5–15)
BUN: 29 mg/dL — ABNORMAL HIGH (ref 6–20)
CO2: 21 mmol/L — ABNORMAL LOW (ref 22–32)
Calcium: 9.2 mg/dL (ref 8.9–10.3)
Chloride: 101 mmol/L (ref 101–111)
Creatinine, Ser: 1.41 mg/dL — ABNORMAL HIGH (ref 0.61–1.24)
GFR calc Af Amer: 54 mL/min — ABNORMAL LOW (ref >60)
GFR calc non Af Amer: 47 mL/min — ABNORMAL LOW (ref >60)
Glucose, Bld: 408 mg/dL — ABNORMAL HIGH (ref 65–99)
Potassium: 4.5 mmol/L (ref 3.5–5.1)
Sodium: 132 mmol/L — ABNORMAL LOW (ref 135–145)

## 2015-12-01 LAB — URINALYSIS COMPLETE WITH MICROSCOPIC (ARMC ONLY)
Bacteria, UA: NONE SEEN
Bilirubin Urine: NEGATIVE
Glucose, UA: >500 mg/dL — AB
Ketones, ur: NEGATIVE mg/dL
Leukocytes, UA: NEGATIVE
Nitrite: NEGATIVE
Protein, ur: NEGATIVE mg/dL
Specific Gravity, Urine: 1.022 (ref 1.005–1.030)
Squamous Epithelial / LPF: NONE SEEN
pH: 5 (ref 5.0–8.0)

## 2015-12-01 MED ORDER — ONDANSETRON HCL 4 MG/2ML IJ SOLN
4.0000 mg | Freq: Once | INTRAMUSCULAR | Status: AC
  Administered 2015-12-01: 4 mg via INTRAVENOUS
  Filled 2015-12-01: qty 2

## 2015-12-01 MED ORDER — MORPHINE SULFATE (PF) 4 MG/ML IV SOLN
4.0000 mg | Freq: Once | INTRAVENOUS | Status: AC
  Administered 2015-12-01: 4 mg via INTRAVENOUS
  Filled 2015-12-01: qty 1

## 2015-12-01 MED ORDER — SODIUM CHLORIDE 0.9 % IV BOLUS (SEPSIS)
1000.0000 mL | Freq: Once | INTRAVENOUS | Status: AC
  Administered 2015-12-01: 1000 mL via INTRAVENOUS

## 2015-12-01 MED ORDER — TRAMADOL HCL 50 MG PO TABS
ORAL_TABLET | ORAL | Status: AC
  Administered 2015-12-01: 50 mg via ORAL
  Filled 2015-12-01: qty 1

## 2015-12-01 MED ORDER — IOPAMIDOL (ISOVUE-300) INJECTION 61%
80.0000 mL | Freq: Once | INTRAVENOUS | Status: AC | PRN
  Administered 2015-12-01: 80 mL via INTRAVENOUS
  Filled 2015-12-01: qty 90

## 2015-12-01 MED ORDER — DIATRIZOATE MEGLUMINE & SODIUM 66-10 % PO SOLN
15.0000 mL | Freq: Once | ORAL | Status: AC
  Administered 2015-12-01: 15 mL via ORAL

## 2015-12-01 MED ORDER — TRAMADOL HCL 50 MG PO TABS
50.0000 mg | ORAL_TABLET | ORAL | Status: AC
  Administered 2015-12-01: 50 mg via ORAL

## 2015-12-01 MED ORDER — TRAMADOL HCL 50 MG PO TABS: 50.0000 mg | ORAL_TABLET | Freq: Four times a day (QID) | ORAL | Status: DC | PRN

## 2015-12-01 NOTE — ED Notes (Signed)
States left flank pain for 2 days, denies any blood in his urine but hx of kidney stones

## 2015-12-01 NOTE — ED Notes (Signed)
Patient presents to ED with c/o left flank pain radiating to left groin for 2 weeks. Patient reports was seen by PCP to evaluate "knot on left chest." Patient states "I feel like the knot pain has moved down to my groin." Patient reports hx of kidney stones. Denies N/V/D, denies urinary symptoms. Pt alert and oriented x 4, respirations even and unlabored. Skin warm and dry.

## 2015-12-01 NOTE — ED Notes (Signed)

## 2015-12-01 NOTE — ED Notes (Addendum)
Pt states he has finished with his bottle of contrast. CT aware.

## 2015-12-01 NOTE — Discharge Instructions (Signed)
You were seen in the emergency room for abdominal pain. It is important that you follow up closely with your primary care doctor (Dr. Hall Busing). Please call his office in the morning to set up close follow-up. You will need further evaluation for your abdominal pain's cause and "enlarged lymph nodes" in the abdomen as well as a "cyst" noted on the pancreas today.  Please return to the emergency room right away if you are to develop a fever, severe nausea, your pain becomes severe or worsens, you are unable to keep food down, begin vomiting any dark or bloody fluid, you develop any dark or bloody stools, feel dehydrated, or other new concerns or symptoms arise.   Abdominal Pain, Adult Many things can cause abdominal pain. Usually, abdominal pain is not caused by a disease and will improve without treatment. It can often be observed and treated at home. Your health care provider will do a physical exam and possibly order blood tests and X-rays to help determine the seriousness of your pain. However, in many cases, more time must pass before a clear cause of the pain can be found. Before that point, your health care provider may not know if you need more testing or further treatment. HOME CARE INSTRUCTIONS Monitor your abdominal pain for any changes. The following actions may help to alleviate any discomfort you are experiencing:  Only take over-the-counter or prescription medicines as directed by your health care provider.  Do not take laxatives unless directed to do so by your health care provider.  Try a clear liquid diet (broth, tea, or water) as directed by your health care provider. Slowly move to a bland diet as tolerated. SEEK MEDICAL CARE IF:  You have unexplained abdominal pain.  You have abdominal pain associated with nausea or diarrhea.  You have pain when you urinate or have a bowel movement.  You experience abdominal pain that wakes you in the night.  You have abdominal pain that is  worsened or improved by eating food.  You have abdominal pain that is worsened with eating fatty foods.  You have a fever. SEEK IMMEDIATE MEDICAL CARE IF:  Your pain does not go away within 2 hours.  You keep throwing up (vomiting).  Your pain is felt only in portions of the abdomen, such as the right side or the left lower portion of the abdomen.  You pass bloody or black tarry stools. MAKE SURE YOU:  Understand these instructions.  Will watch your condition.  Will get help right away if you are not doing well or get worse.   This information is not intended to replace advice given to you by your health care provider. Make sure you discuss any questions you have with your health care provider.   Document Released: 03/15/2005 Document Revised: 02/24/2015 Document Reviewed: 02/12/2013 Elsevier Interactive Patient Education [employer].

## 2015-12-01 NOTE — ED Notes (Signed)
MD at bedside. 

## 2015-12-01 NOTE — ED Notes (Signed)
Patient transported to CT via stretcher.

## 2015-12-01 NOTE — ED Provider Notes (Signed)
[facility] Emergency Department Provider Note  ____________________________________________  Time seen: Approximately 8:15 PM  I have reviewed the triage vital signs and the nursing notes.   HISTORY  Chief Complaint Flank Pain    HPI NYCHOLAS PEOT is a 75 y.o. male presents for evaluation of left lower abdominal pain. Patient reports he has pain in his left lower abdomen for about the last week, it radiates towards his left lower back. He also reports that he feels like he needs to urinate frequently, and has a history of kidney stones that caused a partial blockage on the left in the past.  No fevers or chills. Some mild nausea but no vomiting. Normal bowel movement today. Does not feel constipated or swollen.  Denies chest pain, states that a little over a week ago he was having a little discomfort in the left chest but saw and was cleared by Dr. Clayborn Bigness his cardiologist. Not had any further pain in his chest.  He reports that he did not take his diabetes medicines this morning, some days he just doesn't do it.   Past Medical History  Diagnosis Date  . Hypertension   . Diabetes mellitus without complication ([place])   . Hyperlipidemia   . Coronary artery disease   . Asthma   . Heart murmur   . Myocardial infarction ([place])   . BPH (benign prostatic hyperplasia)   . Chronic kidney disease     Kidney Stones    There are no active problems to display for this patient.   Past Surgical History  Procedure Laterality Date  . Coronary artery bypass graft    . Colonoscopy with propofol N/A 12/03/2014    Procedure: COLONOSCOPY WITH PROPOFOL;  Surgeon: Manya Silvas, MD;  Location: [facility] ENDOSCOPY;  Service: Endoscopy;  Laterality: N/A;    Current Outpatient Rx  Name  Route  Sig  Dispense  Refill  . acetaminophen (TYLENOL) 325 MG tablet   Oral   Take 650 mg by mouth every 6 (six) hours as needed for headache.         . albuterol (PROVENTIL  HFA;VENTOLIN HFA) 108 (90 BASE) MCG/ACT inhaler   Inhalation   Inhale 2 puffs into the lungs every 6 (six) hours as needed for wheezing or shortness of breath.         Marland Kitchen aspirin 81 MG tablet   Oral   Take 81 mg by mouth daily.         Marland Kitchen atorvastatin (LIPITOR) 20 MG tablet   Oral   Take 20 mg by mouth daily.         Marland Kitchen glimepiride (AMARYL) 2 MG tablet   Oral   Take 2 mg by mouth daily with breakfast.         . isosorbide mononitrate (IMDUR) 30 MG 24 hr tablet   Oral   Take 30 mg by mouth daily.         Marland Kitchen lisinopril (PRINIVIL,ZESTRIL) 5 MG tablet   Oral   Take 5 mg by mouth daily.         . metFORMIN (GLUCOPHAGE) 500 MG tablet   Oral   Take 1,000 mg by mouth 2 (two) times daily with a meal.         . metoprolol tartrate (LOPRESSOR) 25 MG tablet   Oral   Take 12.5 mg by mouth 2 (two) times daily.         . traMADol (ULTRAM) 50 MG tablet  Oral   Take 1 tablet (50 mg total) by mouth every 6 (six) hours as needed.   20 tablet   0     Allergies Review of patient's allergies indicates no known allergies.  History reviewed. No pertinent family history.  Social History Social History  Substance Use Topics  . Smoking status: Former Smoker    Quit date: 06/19/1984  . Smokeless tobacco: Never Used  . Alcohol Use: No    Review of Systems Constitutional: No fever/chills Eyes: No visual changes. ENT: No sore throat. Cardiovascular: Denies chest pain. Respiratory: Denies shortness of breath. Gastrointestinal: No vomiting.  No diarrhea.  No constipation. Genitourinary: Negative for dysuria. Musculoskeletal: Negative for back pain. Skin: Negative for rash. Neurological: Negative for headaches, focal weakness or numbness.  10-point ROS otherwise negative.  ____________________________________________   PHYSICAL EXAM:  VITAL SIGNS: ED Triage Vitals  Enc Vitals Group     BP 12/01/15 1853 100/64 mmHg     Pulse Rate 12/01/15 1853 86     Resp  12/01/15 1853 18     Temp 12/01/15 1853 98.1 F (36.7 C)     Temp Source 12/01/15 1853 Oral     SpO2 12/01/15 1853 97 %     Weight 12/01/15 1853 210 lb (95.255 kg)     Height 12/01/15 1853 5\' 6"  (1.676 m)     Head Cir --      Peak Flow --      Pain Score 12/01/15 1854 9     Pain Loc --      Pain Edu? --      Excl. in [place]? --    Constitutional: Alert and oriented. Well appearing and in no acute distress. Eyes: Conjunctivae are normal. PERRL. EOMI. Head: Atraumatic. Nose: No congestion/rhinnorhea. Mouth/Throat: Mucous membranes are moist.  Oropharynx non-erythematous. Neck: No stridor.   Cardiovascular: Normal rate, regular rhythm. Grossly normal heart sounds.  Good peripheral circulation. Respiratory: Normal respiratory effort.  No retractions. Lungs CTAB. Gastrointestinal: Soft and nontender on the right, but the patient has moderate tenderness with some mild rebound peritonitis noted over the left lower quadrant. He also has moderate to severe left lower CVA tenderness. No CVA tenderness. Musculoskeletal: No lower extremity tenderness nor edema.  No joint effusions. Neurologic:  Normal speech and language. No gross focal neurologic deficits are appreciated. No gait instability. Skin:  Skin is warm, dry and intact. No rash noted. Psychiatric: Mood and affect are normal. Speech and behavior are normal.  ____________________________________________   LABS (all labs ordered are listed, but only abnormal results are displayed)  Labs Reviewed  URINALYSIS COMPLETEWITH MICROSCOPIC ([facility] ONLY) - Abnormal; Notable for the following:    Color, Urine YELLOW (*)    APPearance CLEAR (*)    Glucose, UA >500 (*)    Hgb urine dipstick 1+ (*)    All other components within normal limits  CBC - Abnormal; Notable for the following:    HCT 38.2 (*)    RDW 15.1 (*)    All other components within normal limits  BASIC METABOLIC PANEL - Abnormal; Notable for the following:    Sodium 132 (*)     CO2 21 (*)    Glucose, Bld 408 (*)    BUN 29 (*)    Creatinine, Ser 1.41 (*)    GFR calc non Af Amer 47 (*)    GFR calc Af Amer 54 (*)    All other components within normal limits  GLUCOSE, CAPILLARY - Abnormal; Notable  for the following:    Glucose-Capillary 320 (*)    All other components within normal limits  CBG MONITORING, ED   ____________________________________________  EKG   ____________________________________________  RADIOLOGY   CT Abdomen Pelvis W Contrast (Final result) Result time: 12/01/15 21:57:50   Final result by Rad Results In Interface (12/01/15 21:57:50)   Narrative:   CLINICAL DATA: Left lower quadrant left flank pain for 2 days. Personal history of kidney stones.  EXAM: CT ABDOMEN AND PELVIS WITH CONTRAST  TECHNIQUE: Multidetector CT imaging of the abdomen and pelvis was performed using the standard protocol following bolus administration of intravenous contrast.  CONTRAST: 44mL ISOVUE-300 IOPAMIDOL (ISOVUE-300) INJECTION 61%  COMPARISON: CT abdomen pelvis 12/03/2006.  FINDINGS: Lower chest: Scarring is present at the lung bases. Extensive pleural calcifications are again noted. There is a calcified nodule along the right major fissure. No other discrete nodule is present. The heart size is normal. No significant pleural or pericardial effusion is present.  Hepatobiliary: The liver, common bile duct, and gallbladder are normal.  Pancreas: A 10 mm cystic lesion with calcification is noted in the midbody of the pancreas. This is best seen on the coronal images (image 67 of series 5) an additional more proximal calcification is present as well. There is mild dilation of the duct at the pancreatic head.  Spleen: The spleen is mildly enlarged. No discrete mass lesion is present.  Adrenals/Urinary Tract: Adrenal glands are within normal limits bilaterally. A 2.2 cm simple cyst is present in the posterior medial aspect of the right  kidney. A 10 mm nonobstructing stone is present at the lower pole of the left kidney. There is mild dilation of the renal pelvis bilaterally. There is no hydronephrosis. Ureter is of normal size bilaterally be on the UPJ the urinary bladder is within normal limits.  Stomach/Bowel: Stomach and duodenum are within normal limits. The small bowel is within normal limits. The distal small bowel is relatively collapsed. The appendix is visualized and normal. The cecum and ascending colon are within normal limits. The transverse and descending colon are unremarkable. There is minimal diverticular change within the sigmoid colon without focal inflammation. The rectum is within normal limits.  Vascular/Lymphatic: Extensive vascular calcifications are present. Sub cm para-aortic lymph nodes are similar to the prior study.  Reproductive: Prostate is unremarkable.  Other: Fat herniates into the inguinal canals bilaterally. No significant free fluid is present.  Musculoskeletal: Bone windows demonstrate no focal lytic or blastic lesions. The soft tissues are otherwise unremarkable.  IMPRESSION: 1. No acute or focal lesion to explain the patient's left lower quadrant abdominal pain. 2. Minimal diverticulosis without diverticulitis. 3. 10 mm nonobstructing stone at the lower pole of the left kidney. 4. Mild dilation of the renal pelvis bilaterally without significant stenosis. 5. 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 6. Multiple small para-aortic retro peritoneal lymph nodes. This may reflect treated malignancy or lymphoproliferative disease. Splenomegaly also suggests lymphoproliferative disease. 7. 10 mm cystic area within the body of the pancreas. Recommend MRI of the abdomen without and with contrast. MRCP is also recommended. 8. 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   Electronically Signed By: San Morelle M.D. On: 12/01/2015 21:57        ____________________________________________   PROCEDURES  Procedure(s) performed: None  Critical Care performed: No  ____________________________________________   INITIAL IMPRESSION / ASSESSMENT AND PLAN / ED COURSE  Pertinent labs & imaging results that were available during my care of the patient were reviewed by me and considered  in my medical decision making (see chart for details).  Differential diagnosis includes but is not limited to, abdominal perforation, aortic dissection, cholecystitis, appendicitis, diverticulitis, colitis, esophagitis/gastritis, kidney stone, pyelonephritis, urinary tract infection, aortic aneurysm. All are considered in decision and treatment plan. Based upon the patient's presentation and risk factors, we'll proceed with CT with contrast. I am concerned about the possibility of diverticulitis, kidney stone, or felt less likely other etiology such as dissection/aneurysm. Based on the patient's mild peritonitis noted in the left lower quadrant, no proceed with imaging. He is overall nontoxic appearing, stable hemodynamics though borderline hypotension for age.  In addition the patient's glucose is elevated, we will hydrate and reassess his blood sugar. He does report noncompliance with his diabetes medications today.  ----------------------------------------- 10:14 PM on 12/01/2015 -----------------------------------------  Patient reports feeling improved. After hydration his blood sugar has improved. Discussed with the patient, and also a friend who is with him his CT findings. In particular we discussed that I do not see any cause for antibiotic, or emergent concern on his CT tonight however I am concerned that he has several enlarged lymph nodes, and he will need close follow-up with his primary care doctor, and also have given him follow-up with gastroentrology for further evaluation. Also discussed that he has an enlarged area within part of his  pancreas, and he will need close follow-up with gastroenterology. I suggested that he needs to follow up with his primary care to help coordinate these follow-ups with we further evaluate.  The patient and his friend are agreeable. He is awake alert in no distress. We did discuss the importance of continuing his Amaryl, and also that he must wait 48 hours before resuming his metformin. The patient is agreeable. I gave him very careful with abdominal pain return precautions for which he is agreeable.  Patient tells me that he will go to Dr. Juanell Fairly office tomorrow, and personally deliver discharge papers with recommendations for follow-up.  Believe the patient is stable for outpatient evaluation. He is awake alert, no distress. Discussed risks and benefits of mild pain medication including tramadol as needed. Patient is agreeable to this and close return precautions and follow-up with Dr. Hall Busing and gastroenterology.  Patient's friend and neighbor's driving him home. Patient agrees not to drive while taking tramadol. ____________________________________________   FINAL CLINICAL IMPRESSION(S) / ED DIAGNOSES  Final diagnoses:  Pancreas cyst  Retroperitoneal lymphadenopathy  Abdominal pain, left lower quadrant      Delman Kitten, MD 12/01/15 2251

## 2015-12-02 ENCOUNTER — Ambulatory Visit (INDEPENDENT_AMBULATORY_CARE_PROVIDER_SITE_OTHER): Payer: Medicare Other | Admitting: Family Medicine

## 2015-12-02 VITALS — BP 106/60 | HR 76 | Temp 98.0°F | Resp 16 | Wt 210.0 lb

## 2015-12-02 DIAGNOSIS — E119 Type 2 diabetes mellitus without complications: Secondary | ICD-10-CM

## 2015-12-02 DIAGNOSIS — R935 Abnormal findings on diagnostic imaging of other abdominal regions, including retroperitoneum: Secondary | ICD-10-CM

## 2015-12-02 DIAGNOSIS — K862 Cyst of pancreas: Secondary | ICD-10-CM

## 2015-12-02 DIAGNOSIS — N200 Calculus of kidney: Secondary | ICD-10-CM | POA: Diagnosis not present

## 2015-12-02 DIAGNOSIS — R590 Localized enlarged lymph nodes: Secondary | ICD-10-CM | POA: Diagnosis not present

## 2015-12-02 DIAGNOSIS — R1032 Left lower quadrant pain: Secondary | ICD-10-CM | POA: Diagnosis not present

## 2015-12-02 LAB — GLUCOSE, POCT (MANUAL RESULT ENTRY): POC Glucose: 349 mg/dl — AB (ref 70–99)

## 2015-12-02 MED ORDER — HYDROCODONE-ACETAMINOPHEN 5-325 MG PO TABS: 1.0000 | ORAL_TABLET | Freq: Four times a day (QID) | ORAL | Status: DC | PRN

## 2015-12-02 NOTE — Progress Notes (Signed)
Patient ID: Peter Robertson, male   DOB: [birth year], 75 y.o.   MRN: [id number]   ISAIHA DRECHSLER  MRN: [id number] DOB: [birth year]  Subjective:  HPI   The patient is a 75 year old patient of Dr. Sharlet Salina Tate's.  He presents today while Dr. Hall Busing is out of town to follow up on his ER visit from yesterday.  He went to the ER with left lower quadrant pain.  He had CT done and it revealed a 10 mm cystic area within the body of the pancreas that they recommended getting an MRI and referral to GI.  They also found a non obstructing 10 mm stone in the lower pole of the left kidney.  At the time of the visit to the ER the patient had blood sugars of 400 and 500.  He states yesterday that he had not taken his diabetic medication.  He states that when he checked it this morning it was 220 fasting.  The patient states that he is off balance when he walks because of the pain and dizziness.   There are no active problems to display for this patient.   Past Medical History  Diagnosis Date  . Hypertension   . Diabetes mellitus without complication ([place])   . Hyperlipidemia   . Coronary artery disease   . Asthma   . Heart murmur   . Myocardial infarction ([place])   . BPH (benign prostatic hyperplasia)   . Chronic kidney disease     Kidney Stones    Social History   Social History  . Marital Status: Married    Spouse Name: N/A  . Number of Children: N/A  . Years of Education: N/A   Occupational History  . Not on file.   Social History Main Topics  . Smoking status: Former Smoker    Quit date: 06/19/1984  . Smokeless tobacco: Never Used  . Alcohol Use: No  . Drug Use: No  . Sexual Activity: Not on file   Other Topics Concern  . Not on file   Social History Narrative    Outpatient Prescriptions Prior to Visit  Medication Sig Dispense Refill  . acetaminophen (TYLENOL) 325 MG tablet Take 650 mg by mouth every 6 (six) hours as needed for headache.    . albuterol (PROVENTIL HFA;VENTOLIN HFA) 108  (90 BASE) MCG/ACT inhaler Inhale 2 puffs into the lungs every 6 (six) hours as needed for wheezing or shortness of breath.    Marland Kitchen aspirin 81 MG tablet Take 81 mg by mouth daily.    Marland Kitchen atorvastatin (LIPITOR) 20 MG tablet Take 20 mg by mouth daily.    Marland Kitchen glimepiride (AMARYL) 2 MG tablet Take 2 mg by mouth daily with breakfast.    . isosorbide mononitrate (IMDUR) 30 MG 24 hr tablet Take 30 mg by mouth daily.    Marland Kitchen lisinopril (PRINIVIL,ZESTRIL) 5 MG tablet Take 5 mg by mouth daily.    . metFORMIN (GLUCOPHAGE) 500 MG tablet Take 1,000 mg by mouth 2 (two) times daily with a meal.    . metoprolol tartrate (LOPRESSOR) 25 MG tablet Take 12.5 mg by mouth 2 (two) times daily.    . traMADol (ULTRAM) 50 MG tablet Take 1 tablet (50 mg total) by mouth every 6 (six) hours as needed. 20 tablet 0   No facility-administered medications prior to visit.    Allergies  Allergen Reactions  . Tape     Review of Systems  Constitutional: Positive for malaise/fatigue. Negative for fever  and chills.  Respiratory: Positive for cough and sputum production. Negative for hemoptysis, shortness of breath and wheezing.   Cardiovascular: Negative for chest pain, palpitations, orthopnea, claudication and leg swelling.  Neurological: Positive for dizziness, weakness and headaches.   Objective:  BP 106/60 mmHg  Pulse 76  Temp(Src) 98 F (36.7 C) (Oral)  Resp 16  Wt 210 lb (95.255 kg)  Physical Exam  Constitutional: He is oriented to person, place, and time and well-developed, well-nourished, and in no distress.  HENT:  Head: Normocephalic and atraumatic.  Right Ear: External ear normal.  Left Ear: External ear normal.  Nose: Nose normal.  Eyes: Conjunctivae are normal. Pupils are equal, round, and reactive to light.  Neck: Normal range of motion. Neck supple.  Cardiovascular: Normal rate, regular rhythm and normal heart sounds.   Pulmonary/Chest: Effort normal and breath sounds normal.  Abdominal: Soft. Bowel sounds  are normal. He exhibits no distension and no mass. There is tenderness. There is no rebound and no guarding.  Left lower quadrant tenderness  Neurological: He is alert and oriented to person, place, and time.  Skin: Skin is warm and dry.  Psychiatric: Mood, memory, affect and judgment normal.  Left upper quadrant tenderness on exam today. Again,very mild,certainly no guarding, rebound, or mass effect. No skin rash noted. Neurologic exam is grossly nonfocal today.   Assessment and Plan :   1. Type 2 diabetes mellitus without complication, without long-term current use of insulin (HCC) Patient instructed to take regular medications - POCT Glucose (CBG)--224 this afternoon. Patient to see Dr. Hall Busing when he is back in town in a couple of weeks. He knows that we are available to see him if he feels worse in the meantime. 2. Left lower quadrant pain Mild tenderness today is actually in the left upper quadrant - Ambulatory referral to Gastroenterology - MR Entero Abd W/O W/Cm; Future - MR Pelvis W Wo Contrast; Future - HYDROcodone-acetaminophen (NORCO/VICODIN) 5-325 MG tablet; Take 1 tablet by mouth every 6 (six) hours as needed for moderate pain.  Dispense: 35 tablet; Refill: 0  3. Kidney stone Clinically this seems to be the source of his discomfort today. The discomfort is radiating down the left flank into the left groin. He has tramadol that they gave him in the ED for the kidney stone. I have written him for some Vicodin and just in case the stone breaks loose  causes severe pain. - MR Pelvis W Wo Contrast; Future - HYDROcodone-acetaminophen (NORCO/VICODIN) 5-325 MG tablet; Take 1 tablet by mouth every 6 (six) hours as needed for moderate pain.  Dispense: 35 tablet; Refill: 0  4. Abnormal CT of the abdomen More than 50% of this visit is spent in counseling regarding these issues. - Ambulatory referral to Gastroenterology - MR Entero Abd W/O W/Cm; Future - MR Pelvis W Wo Contrast;  Future - HYDROcodone-acetaminophen (NORCO/VICODIN) 5-325 MG tablet; Take 1 tablet by mouth every 6 (six) hours as needed for moderate pain.  Dispense: 35 tablet; Refill: 0  5. Retroperitoneal lymphadenopathy May eventually require tissue diagnosis. - Ambulatory referral to Gastroenterology - MR Entero Abd W/O W/Cm; Future - MR Pelvis W Wo Contrast; Future - HYDROcodone-acetaminophen (NORCO/VICODIN) 5-325 MG tablet; Take 1 tablet by mouth every 6 (six) hours as needed for moderate pain.  Dispense: 35 tablet; Refill: 0  6. Pancreatic cyst MRI ordered and  GI referral obtained. - Ambulatory referral to Gastroenterology - MR Entero Abd W/O W/Cm; Future - MR Pelvis W Wo Contrast; Future -  HYDROcodone-acetaminophen (NORCO/VICODIN) 5-325 MG tablet; Take 1 tablet by mouth every 6 (six) hours as needed for moderate pain.  Dispense: 35 tablet; Refill: 0 7. CAD Risk factors treated in this does not appear to be a cardiac issue at all. I have done the exam and reviewed the above chart and it is accurate to the best of my knowledge.  Patient was seen and examined by Dr. Delfino Lovett L. [place] Mon and the note was scribed by Althea Charon, RMA.   Miguel Aschoff MD [facility] 12/02/2015 3:47 PM

## 2015-12-02 NOTE — Patient Instructions (Signed)
For treatment of pain start with the prescription of Tramadol.  If this doesn ot help the pain get the prescription for Hydrocodone filled.   Be sure to take all diabetic medication every day unless vomiting.  Awaiting appointments for MRI and Dr. Vira Agar

## 2015-12-06 ENCOUNTER — Other Ambulatory Visit: Payer: Self-pay | Admitting: Emergency Medicine

## 2015-12-06 DIAGNOSIS — K862 Cyst of pancreas: Secondary | ICD-10-CM

## 2015-12-06 DIAGNOSIS — R1032 Left lower quadrant pain: Secondary | ICD-10-CM

## 2015-12-06 DIAGNOSIS — R935 Abnormal findings on diagnostic imaging of other abdominal regions, including retroperitoneum: Secondary | ICD-10-CM

## 2015-12-07 ENCOUNTER — Telehealth: Payer: Self-pay | Admitting: Family Medicine

## 2015-12-07 ENCOUNTER — Encounter: Payer: Self-pay | Admitting: Emergency Medicine

## 2015-12-07 ENCOUNTER — Other Ambulatory Visit: Payer: Self-pay | Admitting: Emergency Medicine

## 2015-12-07 DIAGNOSIS — K862 Cyst of pancreas: Secondary | ICD-10-CM

## 2015-12-07 DIAGNOSIS — R935 Abnormal findings on diagnostic imaging of other abdominal regions, including retroperitoneum: Secondary | ICD-10-CM

## 2015-12-07 DIAGNOSIS — R1032 Left lower quadrant pain: Secondary | ICD-10-CM

## 2015-12-07 NOTE — Telephone Encounter (Signed)
MRI pending,not CT.

## 2015-12-07 NOTE — Telephone Encounter (Signed)
Spoke with pt. He seems very confused about all the appointments that he has. He says he has a lot on him and is getting confused. He request an appointment reminder be sent to his home so that he does not forget about the appointment, I will do so. But FYI,  He states " I am about to give up on it all, I getting tired. I don't want to give up though." I don't know if he's getting depressed or overwhelmed causing the confusing and forgetfulness and if he needed to be seen by [place] or Dr. Hall Busing for these feelings.

## 2015-12-07 NOTE — Telephone Encounter (Signed)
Info transmitted to Dr Sondra Come office.

## 2015-12-07 NOTE — Telephone Encounter (Signed)
It looks like CT results were discussed with patient that's why we saw him for an office visit. Maybe im missing something?-aa

## 2015-12-07 NOTE — Telephone Encounter (Signed)
I have to see Dr Sondra Come nurses today or tomorrow-- I will ask them to call.

## 2015-12-07 NOTE — Telephone Encounter (Signed)
Pt states that he has not gotten results back from CT.Can someone please call him,Thanks

## 2015-12-08 NOTE — Telephone Encounter (Signed)
Thanks

## 2015-12-28 ENCOUNTER — Ambulatory Visit
Admission: RE | Admit: 2015-12-28 | Discharge: 2015-12-28 | Disposition: A | Discharge status: Home / Self Care | Payer: Medicare Other | Source: Ambulatory Visit | Attending: Family Medicine | Admitting: Family Medicine

## 2015-12-28 DIAGNOSIS — R935 Abnormal findings on diagnostic imaging of other abdominal regions, including retroperitoneum: Secondary | ICD-10-CM | POA: Diagnosis present

## 2015-12-28 DIAGNOSIS — R590 Localized enlarged lymph nodes: Secondary | ICD-10-CM | POA: Insufficient documentation

## 2015-12-28 DIAGNOSIS — R1032 Left lower quadrant pain: Secondary | ICD-10-CM | POA: Diagnosis present

## 2015-12-28 DIAGNOSIS — N200 Calculus of kidney: Secondary | ICD-10-CM

## 2015-12-28 DIAGNOSIS — K862 Cyst of pancreas: Secondary | ICD-10-CM | POA: Diagnosis present

## 2015-12-28 MED ORDER — GADOBENATE DIMEGLUMINE 529 MG/ML IV SOLN
20.0000 mL | Freq: Once | INTRAVENOUS | Status: AC | PRN
  Administered 2015-12-28: 20 mL via INTRAVENOUS

## 2016-02-07 ENCOUNTER — Emergency Department
Admission: EM | Admit: 2016-02-07 | Discharge: 2016-02-07 | Disposition: A | Discharge status: Home / Self Care | Payer: Medicare Other | Attending: Emergency Medicine | Admitting: Emergency Medicine

## 2016-02-07 ENCOUNTER — Emergency Department: Payer: Medicare Other

## 2016-02-07 DIAGNOSIS — J45909 Unspecified asthma, uncomplicated: Secondary | ICD-10-CM | POA: Diagnosis not present

## 2016-02-07 DIAGNOSIS — I129 Hypertensive chronic kidney disease with stage 1 through stage 4 chronic kidney disease, or unspecified chronic kidney disease: Secondary | ICD-10-CM | POA: Diagnosis not present

## 2016-02-07 DIAGNOSIS — M79605 Pain in left leg: Secondary | ICD-10-CM | POA: Diagnosis not present

## 2016-02-07 DIAGNOSIS — E1122 Type 2 diabetes mellitus with diabetic chronic kidney disease: Secondary | ICD-10-CM | POA: Insufficient documentation

## 2016-02-07 DIAGNOSIS — I2510 Atherosclerotic heart disease of native coronary artery without angina pectoris: Secondary | ICD-10-CM | POA: Insufficient documentation

## 2016-02-07 DIAGNOSIS — Z7984 Long term (current) use of oral hypoglycemic drugs: Secondary | ICD-10-CM | POA: Diagnosis not present

## 2016-02-07 DIAGNOSIS — Z79899 Other long term (current) drug therapy: Secondary | ICD-10-CM | POA: Diagnosis not present

## 2016-02-07 DIAGNOSIS — I252 Old myocardial infarction: Secondary | ICD-10-CM | POA: Insufficient documentation

## 2016-02-07 DIAGNOSIS — Z7982 Long term (current) use of aspirin: Secondary | ICD-10-CM | POA: Diagnosis not present

## 2016-02-07 DIAGNOSIS — N189 Chronic kidney disease, unspecified: Secondary | ICD-10-CM | POA: Insufficient documentation

## 2016-02-07 DIAGNOSIS — Z87891 Personal history of nicotine dependence: Secondary | ICD-10-CM | POA: Diagnosis not present

## 2016-02-07 LAB — CBC WITH DIFFERENTIAL/PLATELET
Basophils Absolute: 0.1 10*3/uL (ref 0–0.1)
Basophils Relative: 1 %
Eosinophils Absolute: 0.1 10*3/uL (ref 0–0.7)
Eosinophils Relative: 2 %
HCT: 42.6 % (ref 40.0–52.0)
Hemoglobin: 15.1 g/dL (ref 13.0–18.0)
Lymphocytes Relative: 10 %
Lymphs Abs: 0.7 10*3/uL — ABNORMAL LOW (ref 1.0–3.6)
MCH: 28.8 pg (ref 26.0–34.0)
MCHC: 35.4 g/dL (ref 32.0–36.0)
MCV: 81.4 fL (ref 80.0–100.0)
Monocytes Absolute: 0.6 10*3/uL (ref 0.2–1.0)
Monocytes Relative: 9 %
Neutro Abs: 5.2 10*3/uL (ref 1.4–6.5)
Neutrophils Relative %: 78 %
Platelets: 117 10*3/uL — ABNORMAL LOW (ref 150–440)
RBC: 5.23 MIL/uL (ref 4.40–5.90)
RDW: 14.4 % (ref 11.5–14.5)
WBC: 6.7 10*3/uL (ref 3.8–10.6)

## 2016-02-07 LAB — URINALYSIS COMPLETE WITH MICROSCOPIC (ARMC ONLY)
Bacteria, UA: NONE SEEN
Bilirubin Urine: NEGATIVE
Glucose, UA: >500 mg/dL — AB
Leukocytes, UA: NEGATIVE
Nitrite: NEGATIVE
Protein, ur: NEGATIVE mg/dL
Specific Gravity, Urine: 1.022 (ref 1.005–1.030)
Squamous Epithelial / LPF: NONE SEEN
pH: 5 (ref 5.0–8.0)

## 2016-02-07 LAB — TROPONIN I: Troponin I: <0.03 ng/mL (ref <0.03)

## 2016-02-07 LAB — BASIC METABOLIC PANEL
Anion gap: 7 (ref 5–15)
BUN: 34 mg/dL — ABNORMAL HIGH (ref 6–20)
CO2: 24 mmol/L (ref 22–32)
Calcium: 9.5 mg/dL (ref 8.9–10.3)
Chloride: 101 mmol/L (ref 101–111)
Creatinine, Ser: 1.42 mg/dL — ABNORMAL HIGH (ref 0.61–1.24)
GFR calc Af Amer: 54 mL/min — ABNORMAL LOW (ref >60)
GFR calc non Af Amer: 46 mL/min — ABNORMAL LOW (ref >60)
Glucose, Bld: 350 mg/dL — ABNORMAL HIGH (ref 65–99)
Potassium: 4.4 mmol/L (ref 3.5–5.1)
Sodium: 132 mmol/L — ABNORMAL LOW (ref 135–145)

## 2016-02-07 MED ORDER — GABAPENTIN 100 MG PO CAPS: 100.0000 mg | ORAL_CAPSULE | Freq: Three times a day (TID) | ORAL | 0 refills | Status: DC

## 2016-02-07 MED ORDER — SODIUM CHLORIDE 0.9 % IV BOLUS (SEPSIS)
1000.0000 mL | Freq: Once | INTRAVENOUS | Status: AC
  Administered 2016-02-07: 1000 mL via INTRAVENOUS

## 2016-02-07 MED ORDER — GABAPENTIN 100 MG PO CAPS
100.0000 mg | ORAL_CAPSULE | Freq: Once | ORAL | Status: AC
  Administered 2016-02-07: 100 mg via ORAL
  Filled 2016-02-07: qty 1

## 2016-02-07 NOTE — ED Notes (Signed)
Patient transported to Ultrasound 

## 2016-02-07 NOTE — ED Triage Notes (Signed)
Pt arrived via EMS from home. Pt states having burning pain in left leg started 2 weeks ago, worsened the past 3 days. Pt also complains of foul smelling urine. Hx of HTN and DM. Blood pressure per EMS was 156/88, CBG per EMS was 260.

## 2016-02-07 NOTE — Discharge Instructions (Signed)
Take gabapentin 100 mg 3 times a day as prescribed. Follow up with your doctor in 1-2 days for reevaluation. Return to the emergency department if you have abdominal pain, back pain, chest pain, if you notice any changes in the color of your leg, if you have numbness or weakness of your leg, or any other symptoms that are concerning to you.

## 2016-02-07 NOTE — ED Provider Notes (Signed)
[facility] Emergency Department Provider Note  ____________________________________________  Time seen: Approximately 10:19 AM  I have reviewed the triage vital signs and the nursing notes.   HISTORY  Chief Complaint Leg Pain and Dysuria   HPI Peter Robertson is a 75 y.o. male the history of BPH, CAD, diabetes, hypertension, hyperlipidemia who presents for evaluation of left lower extremity pain. Patient reports 3 weeks of constant burning pain located in his left leg from his groin all the way to his knee. He denies history of DVT or any similar pain in the past, denies any trauma, denies back pain, saddle anesthesia, numbness or weakness of his bilateral lower extremities, abdominal pain, chest pain, shortness of breath. Patient reports that his leg has been giving out multiple times over the course of the last 3 weeks however he sustained no falls. Also endorses for the last 24 hours that he has had foul-smelling urine. He denies fever, flank pain. Patient reports he hasn't talked to his doctor about this issue however reports that for the last 2 days the pain has becoming more intense which prompted his visit to the emergency department.  Past Medical History:  Diagnosis Date  . Asthma   . BPH (benign prostatic hyperplasia)   . Chronic kidney disease    Kidney Stones  . Coronary artery disease   . Diabetes mellitus without complication ([place])   . Heart murmur   . Hyperlipidemia   . Hypertension   . Myocardial infarction ([place])     There are no active problems to display for this patient.   Past Surgical History:  Procedure Laterality Date  . COLONOSCOPY WITH PROPOFOL N/A 12/03/2014   Procedure: COLONOSCOPY WITH PROPOFOL;  Surgeon: Manya Silvas, MD;  [facility] ENDOSCOPY;  Service: Endoscopy;  Laterality: N/A;  . CORONARY ARTERY BYPASS GRAFT      Prior to Admission medications   Medication Sig Start Date End Date Taking? Authorizing Provider   acetaminophen (TYLENOL) 325 MG tablet Take 650 mg by mouth every 6 (six) hours as needed for headache.   Yes Historical Provider, MD  aspirin EC 81 MG tablet Take 81 mg by mouth every evening.   Yes Historical Provider, MD  atorvastatin (LIPITOR) 20 MG tablet Take 20 mg by mouth every evening.    Yes Historical Provider, MD  glimepiride (AMARYL) 2 MG tablet Take 2 mg by mouth every evening.    Yes Historical Provider, MD  isosorbide mononitrate (IMDUR) 30 MG 24 hr tablet Take 30 mg by mouth every evening.    Yes Historical Provider, MD  lisinopril (PRINIVIL,ZESTRIL) 5 MG tablet Take 5 mg by mouth every evening.    Yes Historical Provider, MD  metFORMIN (GLUCOPHAGE) 500 MG tablet Take 1,000 mg by mouth 2 (two) times daily with a meal.   Yes Historical Provider, MD  metoprolol tartrate (LOPRESSOR) 25 MG tablet Take 12.5 mg by mouth 2 (two) times daily.   Yes Historical Provider, MD  tamsulosin (FLOMAX) 0.4 MG CAPS capsule Take 0.4 mg by mouth every evening.   Yes Historical Provider, MD  gabapentin (NEURONTIN) 100 MG capsule Take 1 capsule (100 mg total) by mouth 3 (three) times daily. 02/07/16 03/08/16  Rudene Re, MD  HYDROcodone-acetaminophen (NORCO/VICODIN) 5-325 MG tablet Take 1 tablet by mouth every 6 (six) hours as needed for moderate pain. Patient not taking: Reported on 02/07/2016 12/02/15   Jerrol Banana., MD  traMADol (ULTRAM) 50 MG tablet Take 1 tablet (50 mg  total) by mouth every 6 (six) hours as needed. Patient not taking: Reported on 02/07/2016 12/01/15   Delman Kitten, MD    Allergies Tape  No family history on file.  Social History Social History  Substance Use Topics  . Smoking status: Former Smoker    Quit date: 06/19/1984  . Smokeless tobacco: Never Used  . Alcohol use No    Review of Systems  Constitutional: Negative for fever. Eyes: Negative for visual changes. ENT: Negative for sore throat. Cardiovascular: Negative for chest pain. Respiratory: Negative  for shortness of breath. Gastrointestinal: Negative for abdominal pain, vomiting or diarrhea. Genitourinary: Negative for dysuria. + foul smelling urine Musculoskeletal: Negative for back pain. + LLE pain Skin: Negative for rash. Neurological: Negative for headaches, weakness or numbness.  ____________________________________________   PHYSICAL EXAM:  VITAL SIGNS: ED Triage Vitals  Enc Vitals Group     BP 02/07/16 0859 (!) 161/76     Pulse Rate 02/07/16 0859 95     Resp 02/07/16 0859 20     Temp 02/07/16 0859 97.9 F (36.6 C)     Temp Source 02/07/16 0859 Oral     SpO2 02/07/16 0859 97 %     Weight 02/07/16 0900 200 lb 4.8 oz (90.9 kg)     Height 02/07/16 0900 5\' 7"  (1.702 m)     Head Circumference --      Peak Flow --      Pain Score 02/07/16 0901 8     Pain Loc --      Pain Edu? --      Excl. in [place]? --     Constitutional: Alert and oriented. Well appearing and in no apparent distress. HEENT:      Head: Normocephalic and atraumatic.         Eyes: Conjunctivae are normal. Sclera is non-icteric. EOMI. PERRL      Mouth/Throat: Mucous membranes are moist.       Neck: Supple with no signs of meningismus. Cardiovascular: Regular rate and rhythm. No murmurs, gallops, or rubs. 2+ symmetrical distal pulses are present in all extremities. No JVD. strong distal pulses with brisk capillary refill both extremities Respiratory: Normal respiratory effort. Lungs are clear to auscultation bilaterally. No wheezes, crackles, or rhonchi.  Gastrointestinal: Soft, non tender, and non distended with positive bowel sounds. No rebound or guarding. No pulsatile mass Genitourinary: No CVA tenderness.Descended bilateral testicles, no swelling or erythema of the scrotum, no testicular tenderness, no evidence of hernia Musculoskeletal: Nontender with normal range of motion in all extremities, no deformities or pain reproducible on palpation. full painless range of motion of bilateral hips. No edema,  cyanosis, or erythema of extremities. No midline CT and L-spine tenderness Neurologic: Normal speech and language. Face is symmetric. Moving all extremities. No gross focal neurologic deficits are appreciated. full strength and sensation in bilateral lower extremities. 1+ reflexes on bilateral lower extremities Skin: Skin is warm, dry and intact. No rash noted. Psychiatric: Mood and affect are normal. Speech and behavior are normal.  ____________________________________________   LABS (all labs ordered are listed, but only abnormal results are displayed)  Labs Reviewed  CBC WITH DIFFERENTIAL/PLATELET - Abnormal; Notable for the following:       Result Value   Platelets 117 (*)    Lymphs Abs 0.7 (*)    All other components within normal limits  BASIC METABOLIC PANEL - Abnormal; Notable for the following:    Sodium 132 (*)    Glucose, Bld 350 (*)  BUN 34 (*)    Creatinine, Ser 1.42 (*)    GFR calc non Af Amer 46 (*)    GFR calc Af Amer 54 (*)    All other components within normal limits  URINALYSIS COMPLETEWITH MICROSCOPIC ([facility] ONLY) - Abnormal; Notable for the following:    Color, Urine YELLOW (*)    APPearance CLEAR (*)    Glucose, UA >500 (*)    Ketones, ur TRACE (*)    Hgb urine dipstick 2+ (*)    All other components within normal limits  TROPONIN I   ____________________________________________  EKG  ED ECG REPORT I, Rudene Re, the attending physician, personally viewed and interpreted this ECG.  Sinus rhythm, rate of 92, right bundle branch block, normal QTC, right axis deviation, no ST elevations or depressions. Unchanged from prior.  ____________________________________________  RADIOLOGY  Doppler:  Negative ____________________________________________   PROCEDURES  Procedure(s) performed: None Procedures Critical Care performed:  None ____________________________________________   INITIAL IMPRESSION / ASSESSMENT AND PLAN / ED COURSE  75  y.o. male the history of BPH, CAD, diabetes, hypertension, hyperlipidemia who presents for evaluation of 3 weeks of left lower extremity burning pain. On exam patient is well-appearing, in no distress, has full strength and sensation in bilateral lower extremities, has strong distal pulses with brisk capillary refill both extremities, there is no deformities or pain reproducible on palpation, there is no swelling or erythema, full painless range of motion of bilateral hips. No midline CT and L-spine tenderness. His got 1+ reflexes on bilateral lower extremities. His abdomen is soft and nontender. GU exam within normal limits with no evidence of hernia or testicular pain or swelling. Differential diagnoses including neuropathic pain versus DVT versus possible urinary tract infection. Patient has no neurological deficits on that leg to make me concern for dissection. We'll check labs, UA, and have Doppler of that extremity.  Clinical Course  Comment By Time  UA negative for UTI. Glucose 350, CO2 normal, trace ketones in urine. Creatinine mildly elevated at 1.42. IVF ordered. Dopplers pending. Rudene Re, MD 08/21 1113  Ultrasound negative for DVT. Patient remains neurologically intact with full strength and sensation of the leg, no rash, no deformities, no tenderness to palpation, strong distal pulses. He has no chest pain or shortness of breath, no abdominal pain or back pain. I explained to the patient that this could be neuropathic pain and I will start him on gabapentin 900 mg 3 times a day. Recommended that he follow-up with his doctor in 1-2 days. Also recommended that he return to the emergency department if he noticed changes in the color of the leg, weakness or numbness, back pain, abdominal pain, or chest pain. Rudene Re, MD 08/21 1241    Pertinent labs & imaging results that were available during my care of the patient were reviewed by me and considered in my medical decision making  (see chart for details).    ____________________________________________   FINAL CLINICAL IMPRESSION(S) / ED DIAGNOSES  Final diagnoses:  Left leg pain      NEW MEDICATIONS STARTED DURING THIS VISIT:  New Prescriptions   GABAPENTIN (NEURONTIN) 100 MG CAPSULE    Take 1 capsule (100 mg total) by mouth 3 (three) times daily.     Note:  This document was prepared using Dragon voice recognition software and may include unintentional dictation errors.    Rudene Re, MD 02/07/16 1245

## 2016-03-01 ENCOUNTER — Other Ambulatory Visit: Payer: Self-pay | Admitting: Unknown Physician Specialty

## 2016-03-01 DIAGNOSIS — M25562 Pain in left knee: Secondary | ICD-10-CM

## 2016-03-08 ENCOUNTER — Other Ambulatory Visit: Payer: Self-pay | Admitting: Unknown Physician Specialty

## 2016-03-08 DIAGNOSIS — M79605 Pain in left leg: Secondary | ICD-10-CM

## 2016-03-09 ENCOUNTER — Ambulatory Visit
Admission: RE | Admit: 2016-03-09 | Discharge: 2016-03-09 | Disposition: A | Discharge status: Home / Self Care | Payer: Medicare Other | Source: Ambulatory Visit | Attending: Unknown Physician Specialty | Admitting: Unknown Physician Specialty

## 2016-03-09 DIAGNOSIS — M79605 Pain in left leg: Secondary | ICD-10-CM | POA: Diagnosis not present

## 2016-03-09 DIAGNOSIS — R600 Localized edema: Secondary | ICD-10-CM | POA: Diagnosis not present

## 2016-03-13 ENCOUNTER — Ambulatory Visit: Payer: Medicare PPO

## 2016-06-16 ENCOUNTER — Other Ambulatory Visit: Payer: Self-pay | Admitting: Pharmacist

## 2016-06-16 NOTE — Patient Outreach (Signed)
Called Dr. Etta Quill office to inquire about [name] atorvastatin. Chart review revealed that Dr. Clayborn Bigness saw [name] on 05/25/2016 and documented that [name] was to continue atorvastatin therapy. Spoke with a front office staff person who said that Dr. Clayborn Bigness would be out until next Tuesday 06/20/2016 but that she would leave a message with one of the other nurses.  It was explained to her that [facility] was going to be sending their office [facility] refill request. She said she would let the nurses know to be on the lookout for the refill request.  Harlow Asa, PharmD, [place] [facility] [phone]

## 2016-06-16 NOTE — Patient Outreach (Signed)
[facility]ed [facility] [place] to inquire about atorvastatin for Peter Robertson. [facility] confirmed the last fill date was 01/20/16 for a 90 day supply of atorvastatin 20 mg tablets.   The pharmacist also confirmed that the prescription was out of refills and was most recently written by Dr. Clayborn Bigness (Cardiology) [facility]. The pharmacist was asked to send a refill request to Dr. Etta Quill office and she said that she would do it right away.  Dr. Etta Quill office will be called to request a refill authorization.  Harlow Asa, PharmD, [place] [facility] [phone]

## 2016-06-16 NOTE — Patient Outreach (Signed)
Outreach call regarding atorvastatin adherence. Called and spoke with patient. HIPAA identifiers verified and verbal consent received.    Peter Robertson reports that he is taking atorvastatin but he does not remember when it was last filled. He also said that it "may have been stopped or that the pharmacy was unable to get it refilled".  [facility] [place] [place] will be called to verify the availability of refills.  Peter Robertson, PharmD, [place] [facility] [phone]

## 2017-01-10 ENCOUNTER — Encounter: Payer: Self-pay | Admitting: Emergency Medicine

## 2017-01-10 ENCOUNTER — Emergency Department
Admission: EM | Admit: 2017-01-10 | Discharge: 2017-01-10 | Disposition: A | Discharge status: Home / Self Care | Payer: Medicare Other | Attending: Student in an Organized Health Care Education/Training Program | Admitting: Student in an Organized Health Care Education/Training Program

## 2017-01-10 DIAGNOSIS — Z87891 Personal history of nicotine dependence: Secondary | ICD-10-CM | POA: Diagnosis not present

## 2017-01-10 DIAGNOSIS — I129 Hypertensive chronic kidney disease with stage 1 through stage 4 chronic kidney disease, or unspecified chronic kidney disease: Secondary | ICD-10-CM | POA: Insufficient documentation

## 2017-01-10 DIAGNOSIS — Z8679 Personal history of other diseases of the circulatory system: Secondary | ICD-10-CM | POA: Diagnosis not present

## 2017-01-10 DIAGNOSIS — E1165 Type 2 diabetes mellitus with hyperglycemia: Secondary | ICD-10-CM | POA: Diagnosis not present

## 2017-01-10 DIAGNOSIS — N189 Chronic kidney disease, unspecified: Secondary | ICD-10-CM | POA: Insufficient documentation

## 2017-01-10 DIAGNOSIS — E1122 Type 2 diabetes mellitus with diabetic chronic kidney disease: Secondary | ICD-10-CM | POA: Diagnosis not present

## 2017-01-10 DIAGNOSIS — Z7982 Long term (current) use of aspirin: Secondary | ICD-10-CM | POA: Diagnosis not present

## 2017-01-10 DIAGNOSIS — Z951 Presence of aortocoronary bypass graft: Secondary | ICD-10-CM | POA: Insufficient documentation

## 2017-01-10 DIAGNOSIS — Z794 Long term (current) use of insulin: Secondary | ICD-10-CM | POA: Insufficient documentation

## 2017-01-10 DIAGNOSIS — R739 Hyperglycemia, unspecified: Secondary | ICD-10-CM

## 2017-01-10 DIAGNOSIS — Z7984 Long term (current) use of oral hypoglycemic drugs: Secondary | ICD-10-CM | POA: Diagnosis not present

## 2017-01-10 DIAGNOSIS — Z7902 Long term (current) use of antithrombotics/antiplatelets: Secondary | ICD-10-CM | POA: Insufficient documentation

## 2017-01-10 DIAGNOSIS — Z79899 Other long term (current) drug therapy: Secondary | ICD-10-CM | POA: Insufficient documentation

## 2017-01-10 DIAGNOSIS — J45909 Unspecified asthma, uncomplicated: Secondary | ICD-10-CM | POA: Insufficient documentation

## 2017-01-10 DIAGNOSIS — I259 Chronic ischemic heart disease, unspecified: Secondary | ICD-10-CM | POA: Insufficient documentation

## 2017-01-10 LAB — BASIC METABOLIC PANEL
Anion gap: 10 (ref 5–15)
BUN: 28 mg/dL — ABNORMAL HIGH (ref 6–20)
CO2: 24 mmol/L (ref 22–32)
Calcium: 10.1 mg/dL (ref 8.9–10.3)
Chloride: 101 mmol/L (ref 101–111)
Creatinine, Ser: 1.25 mg/dL — ABNORMAL HIGH (ref 0.61–1.24)
GFR calc Af Amer: >60 mL/min (ref >60)
GFR calc non Af Amer: 54 mL/min — ABNORMAL LOW (ref >60)
Glucose, Bld: 414 mg/dL — ABNORMAL HIGH (ref 65–99)
Potassium: 4.3 mmol/L (ref 3.5–5.1)
Sodium: 135 mmol/L (ref 135–145)

## 2017-01-10 LAB — CBC
HCT: 40.6 % (ref 40.0–52.0)
Hemoglobin: 14.1 g/dL (ref 13.0–18.0)
MCH: 28.3 pg (ref 26.0–34.0)
MCHC: 34.7 g/dL (ref 32.0–36.0)
MCV: 81.5 fL (ref 80.0–100.0)
Platelets: 144 10*3/uL — ABNORMAL LOW (ref 150–440)
RBC: 4.98 MIL/uL (ref 4.40–5.90)
RDW: 14.4 % (ref 11.5–14.5)
WBC: 8.4 10*3/uL (ref 3.8–10.6)

## 2017-01-10 LAB — PROTIME-INR
INR: 1.01
Prothrombin Time: 13.3 seconds (ref 11.4–15.2)

## 2017-01-10 LAB — GLUCOSE, CAPILLARY
Glucose-Capillary: 394 mg/dL — ABNORMAL HIGH (ref 65–99)
Glucose-Capillary: 236 mg/dL — ABNORMAL HIGH (ref 65–99)

## 2017-01-10 MED ORDER — INSULIN ASPART 100 UNIT/ML ~~LOC~~ SOLN
5.0000 [IU] | Freq: Once | SUBCUTANEOUS | Status: AC
  Administered 2017-01-10: 5 [IU] via INTRAVENOUS
  Filled 2017-01-10: qty 1

## 2017-01-10 MED ORDER — SODIUM CHLORIDE 0.9 % IV BOLUS (SEPSIS)
500.0000 mL | Freq: Once | INTRAVENOUS | Status: AC
  Administered 2017-01-10: 500 mL via INTRAVENOUS

## 2017-01-10 NOTE — ED Triage Notes (Signed)
Pt with CBG 476 at home. Pt reports taking 15u of long acting insulin but family suspects not, pt has dementia.

## 2017-01-10 NOTE — ED Provider Notes (Signed)
[facility] Emergency Department Provider Note    First MD Initiated Contact with Patient 01/10/17 1947     (approximate)  I have reviewed the triage vital signs and the nursing notes.   HISTORY  Chief Complaint Hyperglycemia    HPI Peter Robertson is a 76 y.o. male history of diabetes on 15 units of long-acting insulin nightly presents with elevated blood sugar after eating pimento cheese sandwich and tomato and may assume much for lunch. Patient denies any nausea or vomiting. States he checked his blood sugar and became concerned. He called his primary physician's office and the nurse instructed EMS to come evaluate the patient. They rechecked his blood sugar there was 490. At that point he was directed to come to the ER for further evaluation. He denies any other symptoms at this time.   Past Medical History:  Diagnosis Date  . Asthma   . BPH (benign prostatic hyperplasia)   . Chronic kidney disease    Kidney Stones  . Coronary artery disease   . Diabetes mellitus without complication ([place])   . Heart murmur   . Hyperlipidemia   . Hypertension   . Myocardial infarction [facility])    No family history on file. Past Surgical History:  Procedure Laterality Date  . COLONOSCOPY WITH PROPOFOL N/A 12/03/2014   Procedure: COLONOSCOPY WITH PROPOFOL;  Surgeon: Manya Silvas, MD;  Location: [facility] ENDOSCOPY;  Service: Endoscopy;  Laterality: N/A;  . CORONARY ARTERY BYPASS GRAFT     There are no active problems to display for this patient.     Prior to Admission medications   Medication Sig Start Date End Date Taking? Authorizing Provider  acetaminophen (TYLENOL) 325 MG tablet Take 650 mg by mouth every 6 (six) hours as needed for headache.    [provider]  aspirin EC 81 MG tablet Take 81 mg by mouth every evening.    [provider]  atorvastatin (LIPITOR) 20 MG tablet Take 20 mg by mouth every evening.     [provider]    gabapentin (NEURONTIN) 100 MG capsule Take 1 capsule (100 mg total) by mouth 3 (three) times daily. 02/07/16 03/08/16  Rudene Re, MD  glimepiride (AMARYL) 2 MG tablet Take 2 mg by mouth every evening.     [provider]  HYDROcodone-acetaminophen (NORCO/VICODIN) 5-325 MG tablet Take 1 tablet by mouth every 6 (six) hours as needed for moderate pain. Patient not taking: Reported on 02/07/2016 12/02/15   Jerrol Banana., MD  isosorbide mononitrate (IMDUR) 30 MG 24 hr tablet Take 30 mg by mouth every evening.     [provider]  lisinopril (PRINIVIL,ZESTRIL) 5 MG tablet Take 5 mg by mouth every evening.     [provider]  metFORMIN (GLUCOPHAGE) 500 MG tablet Take 1,000 mg by mouth 2 (two) times daily with a meal.    [provider]  metoprolol tartrate (LOPRESSOR) 25 MG tablet Take 12.5 mg by mouth 2 (two) times daily.    [provider]  tamsulosin (FLOMAX) 0.4 MG CAPS capsule Take 0.4 mg by mouth every evening.    [provider]  traMADol (ULTRAM) 50 MG tablet Take 1 tablet (50 mg total) by mouth every 6 (six) hours as needed. Patient not taking: Reported on 02/07/2016 12/01/15   Delman Kitten, MD    Allergies Tape    Social History Social History  Substance Use Topics  . Smoking status: Former Audiological scientist  date: 06/19/1984  . Smokeless tobacco: Never Used  . Alcohol use No    Review of Systems Patient denies headaches, rhinorrhea, blurry vision, numbness, shortness of breath, chest pain, edema, cough, abdominal pain, nausea, vomiting, diarrhea, dysuria, fevers, rashes or hallucinations unless otherwise stated above in HPI. ____________________________________________   PHYSICAL EXAM:  VITAL SIGNS: Vitals:   01/10/17 1825  BP: 139/75  Pulse: 99  Resp: 16    Constitutional: Alert and oriented. Well appearing and in no acute distress. Eyes: Conjunctivae are normal.  Head: Atraumatic. Nose: No  congestion/rhinnorhea. Mouth/Throat: Mucous membranes are moist.   Neck: No stridor. Painless ROM.  Cardiovascular: Normal rate, regular rhythm. Grossly normal heart sounds.  Good peripheral circulation. Respiratory: Normal respiratory effort.  No retractions. Lungs CTAB. Gastrointestinal: Soft and nontender. No distention. No abdominal bruits. No CVA tenderness. Genitourinary:  Musculoskeletal: No lower extremity tenderness nor edema.  No joint effusions. Neurologic:  Normal speech and language. No gross focal neurologic deficits are appreciated. No facial droop Skin:  Skin is warm, dry and intact. No rash noted. Psychiatric: Mood and affect are normal. Speech and behavior are normal.  ____________________________________________   LABS (all labs ordered are listed, but only abnormal results are displayed)  Results for orders placed or performed during the hospital encounter of 01/10/17 (from the past 24 hour(s))  Glucose, capillary     Status: Abnormal   Collection Time: 01/10/17  6:32 PM  Result Value Ref Range   Glucose-Capillary 394 (H) 65 - 99 mg/dL  Basic metabolic panel     Status: Abnormal   Collection Time: 01/10/17  6:33 PM  Result Value Ref Range   Sodium 135 135 - 145 mmol/L   Potassium 4.3 3.5 - 5.1 mmol/L   Chloride 101 101 - 111 mmol/L   CO2 24 22 - 32 mmol/L   Glucose, Bld 414 (H) 65 - 99 mg/dL   BUN 28 (H) 6 - 20 mg/dL   Creatinine, Ser 1.25 (H) 0.61 - 1.24 mg/dL   Calcium 10.1 8.9 - 10.3 mg/dL   GFR calc non Af Amer 54 (L) >60 mL/min   GFR calc Af Amer >60 >60 mL/min   Anion gap 10 5 - 15  CBC     Status: Abnormal   Collection Time: 01/10/17  6:33 PM  Result Value Ref Range   WBC 8.4 3.8 - 10.6 K/uL   RBC 4.98 4.40 - 5.90 MIL/uL   Hemoglobin 14.1 13.0 - 18.0 g/dL   HCT 40.6 40.0 - 52.0 %   MCV 81.5 80.0 - 100.0 fL   MCH 28.3 26.0 - 34.0 pg   MCHC 34.7 32.0 - 36.0 g/dL   RDW 14.4 11.5 - 14.5 %   Platelets 144 (L) 150 - 440 K/uL  Protime-INR      Status: None   Collection Time: 01/10/17  6:33 PM  Result Value Ref Range   Prothrombin Time 13.3 11.4 - 15.2 seconds   INR 1.01    ____________________________________________ ____________________________________________  RADIOLOGY   ____________________________________________   PROCEDURES  Procedure(s) performed:  Procedures    Critical Care performed: no ____________________________________________   INITIAL IMPRESSION / ASSESSMENT AND PLAN / ED COURSE  Pertinent labs & imaging results that were available during my care of the patient were reviewed by me and considered in my medical decision making (see chart for details).  DDX: hyperglycemia, dka,   Peter Robertson is a 76 y.o. who presents to the ED with hyperglycemia. Patient afebrile and otherwise well  appearing. Blood work is reassuring and shows no evidence of metabolic acidosis. His blood sugar improved with IV fluids and IV insulin. He has follow-up with PCP. Have his body for the patient is stable for discharge for further workup as an outpatient.  Have discussed with the patient and available family all diagnostics and treatments performed thus far and all questions were answered to the best of my ability. The patient demonstrates understanding and agreement with plan.       ____________________________________________   FINAL CLINICAL IMPRESSION(S) / ED DIAGNOSES  Final diagnoses:  Hyperglycemia      NEW MEDICATIONS STARTED DURING THIS VISIT:  New Prescriptions   No medications on file     Note:  This document was prepared using Dragon voice recognition software and may include unintentional dictation errors.    Merlyn Lot, MD 01/10/17 2121

## 2017-01-10 NOTE — ED Notes (Signed)
Pt ambulatory at time of discharge and in NAD. Pt denies having questions but verbalized understanding of home care.

## 2017-01-30 ENCOUNTER — Observation Stay
Admission: EM | Admit: 2017-01-30 | Discharge: 2017-01-30 | Disposition: A | Discharge status: Other Acute Inpatient Hospital | Payer: Medicare Other | Attending: Emergency Medicine | Admitting: Emergency Medicine

## 2017-01-30 ENCOUNTER — Emergency Department: Payer: Medicare Other

## 2017-01-30 DIAGNOSIS — R079 Chest pain, unspecified: Secondary | ICD-10-CM | POA: Diagnosis present

## 2017-01-30 DIAGNOSIS — E785 Hyperlipidemia, unspecified: Secondary | ICD-10-CM | POA: Insufficient documentation

## 2017-01-30 DIAGNOSIS — I25110 Atherosclerotic heart disease of native coronary artery with unstable angina pectoris: Principal | ICD-10-CM | POA: Insufficient documentation

## 2017-01-30 DIAGNOSIS — I10 Essential (primary) hypertension: Secondary | ICD-10-CM | POA: Insufficient documentation

## 2017-01-30 DIAGNOSIS — Z7984 Long term (current) use of oral hypoglycemic drugs: Secondary | ICD-10-CM | POA: Insufficient documentation

## 2017-01-30 DIAGNOSIS — Z79899 Other long term (current) drug therapy: Secondary | ICD-10-CM | POA: Insufficient documentation

## 2017-01-30 DIAGNOSIS — Z951 Presence of aortocoronary bypass graft: Secondary | ICD-10-CM | POA: Diagnosis not present

## 2017-01-30 DIAGNOSIS — Z7982 Long term (current) use of aspirin: Secondary | ICD-10-CM | POA: Diagnosis not present

## 2017-01-30 DIAGNOSIS — Z91048 Other nonmedicinal substance allergy status: Secondary | ICD-10-CM | POA: Diagnosis not present

## 2017-01-30 DIAGNOSIS — I200 Unstable angina: Secondary | ICD-10-CM

## 2017-01-30 DIAGNOSIS — Z87891 Personal history of nicotine dependence: Secondary | ICD-10-CM | POA: Insufficient documentation

## 2017-01-30 DIAGNOSIS — E119 Type 2 diabetes mellitus without complications: Secondary | ICD-10-CM | POA: Diagnosis not present

## 2017-01-30 DIAGNOSIS — I252 Old myocardial infarction: Secondary | ICD-10-CM | POA: Diagnosis not present

## 2017-01-30 LAB — CBC
HCT: 38.9 % — ABNORMAL LOW (ref 40.0–52.0)
Hemoglobin: 13.5 g/dL (ref 13.0–18.0)
MCH: 28.2 pg (ref 26.0–34.0)
MCHC: 34.8 g/dL (ref 32.0–36.0)
MCV: 81.1 fL (ref 80.0–100.0)
Platelets: 167 10*3/uL (ref 150–440)
RBC: 4.79 MIL/uL (ref 4.40–5.90)
RDW: 15.1 % — ABNORMAL HIGH (ref 11.5–14.5)
WBC: 6.5 10*3/uL (ref 3.8–10.6)

## 2017-01-30 LAB — BASIC METABOLIC PANEL
Anion gap: 9 (ref 5–15)
BUN: 20 mg/dL (ref 6–20)
CO2: 17 mmol/L — ABNORMAL LOW (ref 22–32)
Calcium: 9.6 mg/dL (ref 8.9–10.3)
Chloride: 110 mmol/L (ref 101–111)
Creatinine, Ser: 1.06 mg/dL (ref 0.61–1.24)
GFR calc Af Amer: >60 mL/min (ref >60)
GFR calc non Af Amer: >60 mL/min (ref >60)
Glucose, Bld: 177 mg/dL — ABNORMAL HIGH (ref 65–99)
Potassium: 4.4 mmol/L (ref 3.5–5.1)
Sodium: 136 mmol/L (ref 135–145)

## 2017-01-30 LAB — GLUCOSE, CAPILLARY: Glucose-Capillary: 226 mg/dL — ABNORMAL HIGH (ref 65–99)

## 2017-01-30 LAB — TROPONIN I
Troponin I: <0.03 ng/mL (ref <0.03)
Troponin I: <0.03 ng/mL (ref <0.03)

## 2017-01-30 MED ORDER — SODIUM CHLORIDE 0.9 % IV BOLUS (SEPSIS): 1000.0000 mL | Freq: Once | INTRAVENOUS | Status: DC

## 2017-01-30 MED ORDER — NITROGLYCERIN 0.4 MG SL SUBL
0.4000 mg | SUBLINGUAL_TABLET | SUBLINGUAL | Status: DC | PRN
  Administered 2017-01-30: 0.4 mg via SUBLINGUAL
  Filled 2017-01-30: qty 1

## 2017-01-30 MED ORDER — ASPIRIN 81 MG PO CHEW
324.0000 mg | CHEWABLE_TABLET | Freq: Once | ORAL | Status: AC
  Administered 2017-01-30: 324 mg via ORAL
  Filled 2017-01-30: qty 4

## 2017-01-30 NOTE — ED Notes (Signed)
Went into room to check on patient and he stated "I'm getting ready to leave here." When asked where pt was going he said "I'm going to [place]" then clarified [facility]. Dr. Estanislado Pandy and Dr. Alfred Levins informed.

## 2017-01-30 NOTE — ED Provider Notes (Addendum)
[facility] Emergency Department Provider Note  ____________________________________________  Time seen: Approximately 4:21 PM  I have reviewed the triage vital signs and the nursing notes.   HISTORY  Chief Complaint Chest Pain   HPI Peter Robertson is a 76 y.o. male with the history of CAD s/p CABGwho presents for evaluation of CP. Patient reports that is his second episode of CP in the last week. Both episodes at rest. This one lasted longer and more intense than the one a week ago. Pain is burning, located in the left side of his chest, radiating down his L arm, severe, associated with shortness of breath. No N/V, h/o Gerd, dizziness. Currently 9/10 pain. Pain improved with nitroglycerin that he received per EMS however has recurred. Pain is not pleuritic. No personal or family history of blood clot, no leg pain, no hemoptysis, no exogenous hormones.  Past Medical History:  Diagnosis Date  . Asthma   . BPH (benign prostatic hyperplasia)   . Chronic kidney disease    Kidney Stones  . Coronary artery disease   . Diabetes mellitus without complication ([employer])   . Heart murmur   . Hyperlipidemia   . Hypertension   . Myocardial infarction ([place])     There are no active problems to display for this patient.   Past Surgical History:  Procedure Laterality Date  . COLONOSCOPY WITH PROPOFOL N/A 12/03/2014   Procedure: COLONOSCOPY WITH PROPOFOL;  Surgeon: Manya Silvas, MD;  Location[facility] ENDOSCOPY;  Service: Endoscopy;  Laterality: N/A;  . CORONARY ARTERY BYPASS GRAFT      Prior to Admission medications   Medication Sig Start Date End Date Taking? Authorizing Provider  acetaminophen (TYLENOL) 325 MG tablet Take 650 mg by mouth every 6 (six) hours as needed for headache.    [provider]  aspirin EC 81 MG tablet Take 81 mg by mouth every evening.    [provider]  atorvastatin (LIPITOR) 20 MG tablet Take 20 mg by mouth every  evening.     [provider]  gabapentin (NEURONTIN) 100 MG capsule Take 1 capsule (100 mg total) by mouth 3 (three) times daily. 02/07/16 03/08/16  Rudene Re, MD  glimepiride (AMARYL) 2 MG tablet Take 2 mg by mouth every evening.     [provider]  HYDROcodone-acetaminophen (NORCO/VICODIN) 5-325 MG tablet Take 1 tablet by mouth every 6 (six) hours as needed for moderate pain. Patient not taking: Reported on 02/07/2016 12/02/15   Jerrol Banana., MD  isosorbide mononitrate (IMDUR) 30 MG 24 hr tablet Take 30 mg by mouth every evening.     [provider]  lisinopril (PRINIVIL,ZESTRIL) 5 MG tablet Take 5 mg by mouth every evening.     [provider]  metFORMIN (GLUCOPHAGE) 500 MG tablet Take 1,000 mg by mouth 2 (two) times daily with a meal.    [provider]  metoprolol tartrate (LOPRESSOR) 25 MG tablet Take 12.5 mg by mouth 2 (two) times daily.    [provider]  tamsulosin (FLOMAX) 0.4 MG CAPS capsule Take 0.4 mg by mouth every evening.    [provider]  traMADol (ULTRAM) 50 MG tablet Take 1 tablet (50 mg total) by mouth every 6 (six) hours as needed. Patient not taking: Reported on 02/07/2016 12/01/15   Delman Kitten, MD    Allergies Tape  History reviewed. No pertinent family history.  Social History Social History  Substance Use Topics  . Smoking status: Former  Smoker    Quit date: 06/19/1984  . Smokeless tobacco: Never Used  . Alcohol use No    Review of Systems  Constitutional: Negative for fever. Eyes: Negative for visual changes. ENT: Negative for sore throat. Neck: No neck pain  Cardiovascular: + chest pain. Respiratory: + shortness of breath. Gastrointestinal: Negative for abdominal pain, vomiting or diarrhea. Genitourinary: Negative for dysuria. Musculoskeletal: Negative for back pain. Skin: Negative for rash. Neurological: Negative for headaches, weakness or numbness. Psych: No SI or  HI  ____________________________________________   PHYSICAL EXAM:  VITAL SIGNS: ED Triage Vitals  Enc Vitals Group     BP 01/30/17 1514 119/77     Pulse Rate 01/30/17 1514 80     Resp 01/30/17 1514 (!) 27     Temp 01/30/17 1514 98.3 F (36.8 C)     Temp Source 01/30/17 1514 Oral     SpO2 01/30/17 1514 98 %     Weight 01/30/17 1514 210 lb (95.3 kg)     Height 01/30/17 1514 5\' 6"  (1.676 m)     Head Circumference --      Peak Flow --      Pain Score 01/30/17 1512 9     Pain Loc --      Pain Edu? --      Excl. in [place]? --     Constitutional: Alert and oriented. Well appearing and in no apparent distress. HEENT:      Head: Normocephalic and atraumatic.         Eyes: Conjunctivae are normal. Sclera is non-icteric.       Mouth/Throat: Mucous membranes are moist.       Neck: Supple with no signs of meningismus. Cardiovascular: Regular rate and rhythm. No murmurs, gallops, or rubs. 2+ symmetrical distal pulses are present in all extremities. No JVD. Respiratory: Normal respiratory effort. Lungs are clear to auscultation bilaterally. No wheezes, crackles, or rhonchi.  Gastrointestinal: Soft, non tender, and non distended with positive bowel sounds. No rebound or guarding. Musculoskeletal: trace edema on bilateral lower extremities Neurologic: Normal speech and language. Face is symmetric. Moving all extremities. No gross focal neurologic deficits are appreciated. Skin: Skin is warm, dry and intact. No rash noted. Psychiatric: Mood and affect are normal. Speech and behavior are normal.  ____________________________________________   LABS (all labs ordered are listed, but only abnormal results are displayed)  Labs Reviewed  BASIC METABOLIC PANEL - Abnormal; Notable for the following:       Result Value   CO2 17 (*)    Glucose, Bld 177 (*)    All other components within normal limits  CBC - Abnormal; Notable for the following:    HCT 38.9 (*)    RDW 15.1 (*)    All other  components within normal limits  TROPONIN I   ____________________________________________  EKG  ED ECG REPORT I, Rudene Re, the attending physician, personally viewed and interpreted this ECG.  Normal sinus rhythm, rate of 86, right bundle branch block, normal QTC, right axis deviation, no ST elevations or depressions. Unchanged from prior.  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 No definite superimposed acute cardiopulmonary process. ____________________________________________   PROCEDURES  Procedure(s) performed: None Procedures Critical Care performed:  None ____________________________________________   INITIAL IMPRESSION / ASSESSMENT AND PLAN / ED COURSE  76 y.o. male with the history of CAD s/p CABGwho presents for evaluation of CP concerning for ACS. EKG unchanged from prior. Troponin x 1 negative. Patien's  pain fully after one nitro. Will admit to Hospitalist for further evaluation     _________________________ 8:58 PM on 01/30/2017 -----------------------------------------  Patient evaluated by the hospitalist for admission. Patient's family arrived and requested patient be transferred to [facility]. Discussed with Cardiology fellow at [facility] who requested 2nd troponin to determine best service to admit patient. 2nd troponin negative. Patient remains pain free. We have paged Cardiology at [facility] 3 times and still to hear from them if patient will be accepted or not for transfer. Family has been updated.   Pertinent labs & imaging results that were available during my care of the patient were reviewed by me and considered in my medical decision making (see chart for details).    ____________________________________________   FINAL CLINICAL IMPRESSION(S) / ED DIAGNOSES  Final diagnoses:  Unstable angina (HCC)      NEW MEDICATIONS STARTED DURING THIS VISIT:  New Prescriptions   No  medications on file     Note:  This document was prepared using Dragon voice recognition software and may include unintentional dictation errors.    Rudene Re, MD 01/30/17 [place], [place], MD 01/30/17 2100

## 2017-01-30 NOTE — ED Notes (Signed)
Dr. Clearnce Hasten stated that pt could take his own Lantus, wife went to get it from car.

## 2017-01-30 NOTE — ED Notes (Signed)
Lorinda Creed [phone] Daughter

## 2017-01-30 NOTE — ED Notes (Signed)
Pt informed that Dr. Alfred Levins called [facility] back once troponin resulted, informed waiting to hear back from [facility]

## 2017-01-30 NOTE — ED Provider Notes (Signed)
Patient in no acute distress at this time. Ambulance here to transfer patient to [facility].   Orbie Pyo, MD 01/30/17 [phone]

## 2017-01-30 NOTE — ED Notes (Signed)
Informed pt and family that [facility] wanted a second troponin drawn and then [facility] will call [facility] back with results.

## 2017-01-30 NOTE — ED Notes (Signed)
[employer] form checked for completion 

## 2017-01-30 NOTE — ED Notes (Signed)
Family at bedside. 

## 2017-01-30 NOTE — ED Notes (Signed)
Pt given meal tray, ok per Dr. Alfred Levins

## 2017-01-30 NOTE — ED Notes (Signed)
Pt informed of bed at [facility] and awaiting transfer at this time. Pt verbalized understanding. Resting on stretcher with side rails up. Blanket covering pt, lights dimmed for comfort with TV on.

## 2017-01-30 NOTE — ED Notes (Signed)
Pt still refusing to be admitted to [facility]. Wants to go to [facility]. Informed family members that we are waiting to hear back from [facility].

## 2017-01-30 NOTE — ED Triage Notes (Signed)
Pt arrives to ED via [facility] from home. Home health nurse from [facility] was visiting pt. EMS reports pt has been c/o of L sided CP that radiates down L arm with SOB x 3 days. Pt states he can't take a deep breath. Pt has hx of MI with multiple stents that was "years ago" pt arrives with personal cardiac monitor on from his cardiologist. Pt denies N&V&D, denies diaphoresis, denies dizziness or weakness. Pt appears tachypenic. Pt hx DM, CBG 180 with EMS. Pt takes insulin. Pt is alert and oriented. States pain is burning sensation. EMS gave 2 nitro sprays.

## 2017-01-30 NOTE — ED Notes (Signed)
Pt taken to xray via stretcher  

## 2017-06-29 ENCOUNTER — Emergency Department: Payer: Medicare Other

## 2017-06-29 ENCOUNTER — Other Ambulatory Visit: Payer: Self-pay

## 2017-06-29 ENCOUNTER — Emergency Department
Admission: EM | Admit: 2017-06-29 | Discharge: 2017-06-29 | Disposition: A | Discharge status: Home / Self Care | Payer: Medicare Other | Attending: Emergency Medicine | Admitting: Emergency Medicine

## 2017-06-29 ENCOUNTER — Encounter: Payer: Self-pay | Admitting: Emergency Medicine

## 2017-06-29 DIAGNOSIS — I5020 Unspecified systolic (congestive) heart failure: Secondary | ICD-10-CM | POA: Insufficient documentation

## 2017-06-29 DIAGNOSIS — S20302A Unspecified superficial injuries of left front wall of thorax, initial encounter: Secondary | ICD-10-CM | POA: Insufficient documentation

## 2017-06-29 DIAGNOSIS — R4182 Altered mental status, unspecified: Secondary | ICD-10-CM | POA: Diagnosis not present

## 2017-06-29 DIAGNOSIS — Y929 Unspecified place or not applicable: Secondary | ICD-10-CM | POA: Insufficient documentation

## 2017-06-29 DIAGNOSIS — J45909 Unspecified asthma, uncomplicated: Secondary | ICD-10-CM | POA: Diagnosis not present

## 2017-06-29 DIAGNOSIS — I2510 Atherosclerotic heart disease of native coronary artery without angina pectoris: Secondary | ICD-10-CM | POA: Diagnosis not present

## 2017-06-29 DIAGNOSIS — I110 Hypertensive heart disease with heart failure: Secondary | ICD-10-CM | POA: Insufficient documentation

## 2017-06-29 DIAGNOSIS — W19XXXA Unspecified fall, initial encounter: Secondary | ICD-10-CM

## 2017-06-29 DIAGNOSIS — Z7982 Long term (current) use of aspirin: Secondary | ICD-10-CM | POA: Diagnosis not present

## 2017-06-29 DIAGNOSIS — Y999 Unspecified external cause status: Secondary | ICD-10-CM | POA: Insufficient documentation

## 2017-06-29 DIAGNOSIS — Y939 Activity, unspecified: Secondary | ICD-10-CM | POA: Insufficient documentation

## 2017-06-29 DIAGNOSIS — S299XXA Unspecified injury of thorax, initial encounter: Secondary | ICD-10-CM | POA: Diagnosis present

## 2017-06-29 DIAGNOSIS — Z951 Presence of aortocoronary bypass graft: Secondary | ICD-10-CM | POA: Insufficient documentation

## 2017-06-29 DIAGNOSIS — W01198A Fall on same level from slipping, tripping and stumbling with subsequent striking against other object, initial encounter: Secondary | ICD-10-CM | POA: Insufficient documentation

## 2017-06-29 DIAGNOSIS — E119 Type 2 diabetes mellitus without complications: Secondary | ICD-10-CM | POA: Diagnosis not present

## 2017-06-29 DIAGNOSIS — I252 Old myocardial infarction: Secondary | ICD-10-CM | POA: Insufficient documentation

## 2017-06-29 LAB — URINALYSIS, COMPLETE (UACMP) WITH MICROSCOPIC
Bacteria, UA: NONE SEEN
Bilirubin Urine: NEGATIVE
Glucose, UA: >=500 mg/dL — AB
Hgb urine dipstick: NEGATIVE
Ketones, ur: NEGATIVE mg/dL
Leukocytes, UA: NEGATIVE
Nitrite: NEGATIVE
Protein, ur: NEGATIVE mg/dL
Specific Gravity, Urine: 1.012 (ref 1.005–1.030)
Squamous Epithelial / LPF: NONE SEEN
WBC, UA: NONE SEEN WBC/hpf (ref 0–5)
pH: 6 (ref 5.0–8.0)

## 2017-06-29 LAB — CBC WITH DIFFERENTIAL/PLATELET
Basophils Absolute: 0.1 10*3/uL (ref 0–0.1)
Basophils Relative: 1 %
Eosinophils Absolute: 0.2 10*3/uL (ref 0–0.7)
Eosinophils Relative: 3 %
HCT: 38.7 % — ABNORMAL LOW (ref 40.0–52.0)
Hemoglobin: 13.2 g/dL (ref 13.0–18.0)
Lymphocytes Relative: 11 %
Lymphs Abs: 0.6 10*3/uL — ABNORMAL LOW (ref 1.0–3.6)
MCH: 28.3 pg (ref 26.0–34.0)
MCHC: 34 g/dL (ref 32.0–36.0)
MCV: 83.3 fL (ref 80.0–100.0)
Monocytes Absolute: 0.6 10*3/uL (ref 0.2–1.0)
Monocytes Relative: 11 %
Neutro Abs: 4.6 10*3/uL (ref 1.4–6.5)
Neutrophils Relative %: 76 %
Platelets: 105 10*3/uL — ABNORMAL LOW (ref 150–440)
RBC: 4.65 MIL/uL (ref 4.40–5.90)
RDW: 14.6 % — ABNORMAL HIGH (ref 11.5–14.5)
WBC: 6.1 10*3/uL (ref 3.8–10.6)

## 2017-06-29 LAB — COMPREHENSIVE METABOLIC PANEL
ALT: 19 U/L (ref 17–63)
AST: 23 U/L (ref 15–41)
Albumin: 4.3 g/dL (ref 3.5–5.0)
Alkaline Phosphatase: 61 U/L (ref 38–126)
Anion gap: 9 (ref 5–15)
BUN: 23 mg/dL — ABNORMAL HIGH (ref 6–20)
CO2: 25 mmol/L (ref 22–32)
Calcium: 9.3 mg/dL (ref 8.9–10.3)
Chloride: 104 mmol/L (ref 101–111)
Creatinine, Ser: 1.2 mg/dL (ref 0.61–1.24)
GFR calc Af Amer: >60 mL/min (ref >60)
GFR calc non Af Amer: 57 mL/min — ABNORMAL LOW (ref >60)
Glucose, Bld: 232 mg/dL — ABNORMAL HIGH (ref 65–99)
Potassium: 3.8 mmol/L (ref 3.5–5.1)
Sodium: 138 mmol/L (ref 135–145)
Total Bilirubin: 1.1 mg/dL (ref 0.3–1.2)
Total Protein: 7.2 g/dL (ref 6.5–8.1)

## 2017-06-29 LAB — BRAIN NATRIURETIC PEPTIDE: B Natriuretic Peptide: 203 pg/mL — ABNORMAL HIGH (ref 0.0–100.0)

## 2017-06-29 LAB — TROPONIN I: Troponin I: <0.03 ng/mL (ref <0.03)

## 2017-06-29 MED ORDER — FUROSEMIDE 20 MG PO TABS: 20.0000 mg | ORAL_TABLET | Freq: Every day | ORAL | 11 refills | Status: DC

## 2017-06-29 MED ORDER — FUROSEMIDE 10 MG/ML IJ SOLN
20.0000 mg | Freq: Once | INTRAMUSCULAR | Status: AC
  Administered 2017-06-29: 20 mg via INTRAVENOUS
  Filled 2017-06-29: qty 4

## 2017-06-29 NOTE — ED Notes (Addendum)
MRI called to ask screening questions, but pt was on his way to CT. Informed them this RN would call MRI back upon return from CT.

## 2017-06-29 NOTE — ED Triage Notes (Addendum)
Pt arrives via [facility] from home s/p trip and fall against door frame. Pt reports difficulty taking a deep breath with a sharp burning sensation in left chest area. CBG 273 per EMS. Hx diabetes, HTN, MI. Hx open heart sx for CABG.

## 2017-06-29 NOTE — Discharge Instructions (Signed)
please return for any worse pain or numbness or weakness. Try the Lasix one pill every other day. I would like you to see Dr. Ubaldo Glassing the cardiologist. Monday. He said to show up in the office in the late morning and they will work nightly. He will check on the heart function. See your regular doctor as well on Monday or Tuesday for follow-up. Keep your feet up if you're not walking around.

## 2017-06-29 NOTE — ED Notes (Signed)
Pt ambulatory to wheel chair upon discharge. Verbalized understanding of discharge instructions, follow-up care and prescription. VSS. Skin warm and dry. A&O x4.  

## 2017-06-29 NOTE — ED Notes (Signed)
Pt on phone with MRI answering screening questions. 

## 2017-06-29 NOTE — ED Provider Notes (Addendum)
[facility] Emergency Department Provider Note   ____________________________________________   First MD Initiated Contact with Patient 06/29/17 1440     (approximate)  I have reviewed the triage vital signs and the nursing notes.   HISTORY  Chief Complaint Fall   HPI Peter Robertson is a 77 y.o. male He reports he's been falling a lot for the last several months. He thinks maybe he is losing his balance. he says is just falling. He is not particularly weak or unsteady. He fell today and hit his left side of the chest against the door frame. He complains of a burning pain coming down into his left shoulder and down the front of his arm and also down the lateral side of his left chest.he has full range of motion of his arm and strength. He reports he had shingles in his eye and his pupils somewhat dilated chronic.   Past Medical History:  Diagnosis Date  . Asthma   . BPH (benign prostatic hyperplasia)   . Chronic kidney disease    Kidney Stones  . Coronary artery disease   . Diabetes mellitus without complication ([place])   . Heart murmur   . Hyperlipidemia   . Hypertension   . Myocardial infarction [facility])     Patient Active Problem List   Diagnosis Date Noted  . Chest pain 01/30/2017    Past Surgical History:  Procedure Laterality Date  . COLONOSCOPY WITH PROPOFOL N/A 12/03/2014   Procedure: COLONOSCOPY WITH PROPOFOL;  Surgeon: Manya Silvas, MD;  Location: [facility] ENDOSCOPY;  Service: Endoscopy;  Laterality: N/A;  . CORONARY ARTERY BYPASS GRAFT      Prior to Admission medications   Medication Sig Start Date End Date Taking? Authorizing Provider  acetaminophen (TYLENOL) 325 MG tablet Take 650 mg by mouth every 6 (six) hours as needed for headache.    [provider]  aspirin EC 81 MG tablet Take 81 mg by mouth every evening.    [provider]  atorvastatin (LIPITOR) 20 MG tablet Take 20 mg by mouth every evening.     [provider]  furosemide (LASIX) 20 MG tablet Take 1 tablet (20 mg total) by mouth daily. do not take the Lasix every day take 1 pill every other day. 06/29/17 06/29/18  Nena Polio, MD  gabapentin (NEURONTIN) 100 MG capsule Take 1 capsule (100 mg total) by mouth 3 (three) times daily. Patient not taking: Reported on 01/30/2017 02/07/16 03/08/16  Rudene Re, MD  HYDROcodone-acetaminophen (NORCO/VICODIN) 5-325 MG tablet Take 1 tablet by mouth every 6 (six) hours as needed for moderate pain. Patient not taking: Reported on 02/07/2016 12/02/15   Jerrol Banana., MD  insulin glargine (LANTUS) 100 UNIT/ML injection Inject 15 Units into the skin at bedtime.    [provider]  lisinopril (PRINIVIL,ZESTRIL) 5 MG tablet Take 5 mg by mouth every evening.     [provider]  metFORMIN (GLUCOPHAGE) 500 MG tablet Take 1,000 mg by mouth 2 (two) times daily with a meal.    [provider]  sertraline (ZOLOFT) 50 MG tablet Take 50 mg by mouth daily.    [provider]  traMADol (ULTRAM) 50 MG tablet Take 1 tablet (50 mg total) by mouth every 6 (six) hours as needed. Patient not taking: Reported on 02/07/2016 12/01/15   Delman Kitten, MD    Allergies Tape  No family history on file.  Social History Social History   Tobacco Use  .  Smoking status: Former Smoker    Last attempt to quit: 06/19/1984    Years since quitting: 33.0  . Smokeless tobacco: Never Used  Substance Use Topics  . Alcohol use: No  . Drug use: No    Review of Systems  Constitutional: No fever/chills Eyes: No visual changes. ENT: No sore throat. Cardiovascular: Denies chest pain. Respiratory: Denies shortness of breath. Gastrointestinal: No abdominal pain.  No nausea, no vomiting.  No diarrhea.  No constipation. Genitourinary: Negative for dysuria. Musculoskeletal: Negative for back pain. Skin: Negative for rash. Neurological: Negative for headaches, focal weakness or numbness  except as noted in history of present illness   ____________________________________________   PHYSICAL EXAM:  VITAL SIGNS: ED Triage Vitals [06/29/17 1437]  Enc Vitals Group     BP      Pulse      Resp      Temp      Temp src      SpO2      Weight      Height      Head Circumference      Peak Flow      Pain Score 9     Pain Loc      Pain Edu?      Excl. in [place]?     Constitutional: Alert and oriented. Well appearing and in no acute distress. Eyes: Conjunctivae are normal.  Head: Atraumatic. Nose: No congestion/rhinnorhea. Mouth/Throat: Mucous membranes are moist.  Oropharynx non-erythematous. Neck: No stridor.  No cervical spine tenderness to palpation Cardiovascular: Normal rate, regular rhythm. Grossly normal heart sounds.  Good peripheral circulation. Respiratory: Normal respiratory effort.  No retractions. Lungs CTAB. Gastrointestinal: Soft and nontender. No distention. No abdominal bruits. No CVA tenderness. Musculoskeletal: No lower extremity tenderness 1+ bilateral edema.  No joint effusions.there is the burning pain on the anterior part of the left arm and also in the left chest on palpation. There is a bruise on the lateral side left chest posterior axillary line.Neurologic:  Normal speech and language. radial nerves II through XII are intact except for the people as noted in history of present illness cerebellar finger-nose rapid alternating movements and heel-to-shin are all normal motor strength is equal bilateral sensation is intact except for the numbnessand burning pain as described previously Skin: see description musculoskeletal section Psychiatric: Mood and affect are normal. Speech and behavior are normal.  ____________________________________________   LABS (all labs ordered are listed, but only abnormal results are displayed)  Labs Reviewed  COMPREHENSIVE METABOLIC PANEL - Abnormal; Notable for the following components:      Result Value   Glucose,  Bld 232 (*)    BUN 23 (*)    GFR calc non Af Amer 57 (*)    All other components within normal limits  BRAIN NATRIURETIC PEPTIDE - Abnormal; Notable for the following components:   B Natriuretic Peptide 203.0 (*)    All other components within normal limits  CBC WITH DIFFERENTIAL/PLATELET - Abnormal; Notable for the following components:   HCT 38.7 (*)    RDW 14.6 (*)    Platelets 105 (*)    Lymphs Abs 0.6 (*)    All other components within normal limits  URINALYSIS, COMPLETE (UACMP) WITH MICROSCOPIC - Abnormal; Notable for the following components:   Color, Urine YELLOW (*)    APPearance CLEAR (*)    Glucose, UA >=500 (*)    All other components within normal limits  TROPONIN I   ____________________________________________  EKG  EKG read and interpreted by me shows normal sinus rhythm rate of 77 normal axis right bundle branch block supraventricular bigeminy no acute ST-T wave changes computer is reading borderline ST elevation in lateral leads I do not see it. _ repeat EKG shows sinus bradycardia rate of 55 Normal axis no acute ST-T wave changes right bundle branch block___________________________________________  RADIOLOGY please see MRI result below. IMPRESSION: 1. Cervical spondylosis and degenerative disc disease, causing moderate impingement at C4-5 and C5-6; and mild impingement at C3-4, C6-7, and C7-T1. 2. There is an effusion of the articulation between the odontoid and the left lateral mass of C1, without associated marrow edema.   Electronically Signed   By: Van Clines M.D.   On: 06/29/2017 17:20 CT of the head is read as mild atrophy no acute abnormalities. ____________________________________________   PROCEDURES  Procedure(s) performed:   Procedures  Critical Care performed:   ____________________________________________   INITIAL IMPRESSION / ASSESSMENT AND PLAN / ED COURSE  discussed MRI results with the radiologist. He feels  that this is a chronic effusion with no marrow edema he does not feel that there is any associated bony injury.    discussed with Dr. Ubaldo Glassing  he will follow the patient up Monday in the office for his congestive heart failure   ____________________________________________   FINAL CLINICAL IMPRESSION(S) / ED DIAGNOSES  Final diagnoses:  Fall, initial encounter  Systolic congestive heart failure, unspecified HF chronicity [facility])     ED Discharge Orders        Ordered    furosemide (LASIX) 20 MG tablet  Daily     06/29/17 1816       Note:  This document was prepared using Dragon voice recognition software and may include unintentional dictation errors.    Nena Polio, MD 06/29/17 1834 patient's family asked for 20 of IV Lasix as before they leave. He does not live far away she feels he will make it home without trouble. We'll send him with urinal anyway.   Nena Polio, MD 06/29/17 [phone]

## 2017-09-11 ENCOUNTER — Emergency Department: Payer: Medicare Other

## 2017-09-11 ENCOUNTER — Emergency Department
Admission: EM | Admit: 2017-09-11 | Discharge: 2017-09-12 | Disposition: A | Discharge status: Home / Self Care | Payer: Medicare Other | Attending: Emergency Medicine | Admitting: Emergency Medicine

## 2017-09-11 ENCOUNTER — Encounter: Payer: Self-pay | Admitting: Emergency Medicine

## 2017-09-11 ENCOUNTER — Other Ambulatory Visit: Payer: Self-pay

## 2017-09-11 DIAGNOSIS — Z7982 Long term (current) use of aspirin: Secondary | ICD-10-CM | POA: Diagnosis not present

## 2017-09-11 DIAGNOSIS — I2510 Atherosclerotic heart disease of native coronary artery without angina pectoris: Secondary | ICD-10-CM | POA: Diagnosis not present

## 2017-09-11 DIAGNOSIS — E1122 Type 2 diabetes mellitus with diabetic chronic kidney disease: Secondary | ICD-10-CM | POA: Insufficient documentation

## 2017-09-11 DIAGNOSIS — W01190A Fall on same level from slipping, tripping and stumbling with subsequent striking against furniture, initial encounter: Secondary | ICD-10-CM | POA: Diagnosis not present

## 2017-09-11 DIAGNOSIS — Y9301 Activity, walking, marching and hiking: Secondary | ICD-10-CM | POA: Diagnosis not present

## 2017-09-11 DIAGNOSIS — N189 Chronic kidney disease, unspecified: Secondary | ICD-10-CM | POA: Insufficient documentation

## 2017-09-11 DIAGNOSIS — Z79899 Other long term (current) drug therapy: Secondary | ICD-10-CM | POA: Insufficient documentation

## 2017-09-11 DIAGNOSIS — I129 Hypertensive chronic kidney disease with stage 1 through stage 4 chronic kidney disease, or unspecified chronic kidney disease: Secondary | ICD-10-CM | POA: Insufficient documentation

## 2017-09-11 DIAGNOSIS — Y998 Other external cause status: Secondary | ICD-10-CM | POA: Diagnosis not present

## 2017-09-11 DIAGNOSIS — J45909 Unspecified asthma, uncomplicated: Secondary | ICD-10-CM | POA: Insufficient documentation

## 2017-09-11 DIAGNOSIS — Z87891 Personal history of nicotine dependence: Secondary | ICD-10-CM | POA: Insufficient documentation

## 2017-09-11 DIAGNOSIS — Y92007 Garden or yard of unspecified non-institutional (private) residence as the place of occurrence of the external cause: Secondary | ICD-10-CM | POA: Insufficient documentation

## 2017-09-11 DIAGNOSIS — S32010A Wedge compression fracture of first lumbar vertebra, initial encounter for closed fracture: Secondary | ICD-10-CM | POA: Diagnosis not present

## 2017-09-11 DIAGNOSIS — W19XXXA Unspecified fall, initial encounter: Secondary | ICD-10-CM

## 2017-09-11 DIAGNOSIS — Z794 Long term (current) use of insulin: Secondary | ICD-10-CM | POA: Diagnosis not present

## 2017-09-11 DIAGNOSIS — S3992XA Unspecified injury of lower back, initial encounter: Secondary | ICD-10-CM | POA: Diagnosis present

## 2017-09-11 LAB — URINALYSIS, COMPLETE (UACMP) WITH MICROSCOPIC
Bacteria, UA: NONE SEEN
Bilirubin Urine: NEGATIVE
Glucose, UA: NEGATIVE mg/dL
Hgb urine dipstick: NEGATIVE
Ketones, ur: NEGATIVE mg/dL
Leukocytes, UA: NEGATIVE
Nitrite: NEGATIVE
Protein, ur: NEGATIVE mg/dL
Specific Gravity, Urine: 1.009 (ref 1.005–1.030)
Squamous Epithelial / LPF: NONE SEEN
pH: 5 (ref 5.0–8.0)

## 2017-09-11 LAB — BASIC METABOLIC PANEL
Anion gap: 9 (ref 5–15)
BUN: 40 mg/dL — ABNORMAL HIGH (ref 6–20)
CO2: 19 mmol/L — ABNORMAL LOW (ref 22–32)
Calcium: 9.1 mg/dL (ref 8.9–10.3)
Chloride: 107 mmol/L (ref 101–111)
Creatinine, Ser: 1.89 mg/dL — ABNORMAL HIGH (ref 0.61–1.24)
GFR calc Af Amer: 38 mL/min — ABNORMAL LOW (ref >60)
GFR calc non Af Amer: 33 mL/min — ABNORMAL LOW (ref >60)
Glucose, Bld: 90 mg/dL (ref 65–99)
Potassium: 5.4 mmol/L — ABNORMAL HIGH (ref 3.5–5.1)
Sodium: 135 mmol/L (ref 135–145)

## 2017-09-11 LAB — CBC
HCT: 33.3 % — ABNORMAL LOW (ref 40.0–52.0)
Hemoglobin: 11.2 g/dL — ABNORMAL LOW (ref 13.0–18.0)
MCH: 28.5 pg (ref 26.0–34.0)
MCHC: 33.5 g/dL (ref 32.0–36.0)
MCV: 85.1 fL (ref 80.0–100.0)
Platelets: 134 10*3/uL — ABNORMAL LOW (ref 150–440)
RBC: 3.91 MIL/uL — ABNORMAL LOW (ref 4.40–5.90)
RDW: 17.4 % — ABNORMAL HIGH (ref 11.5–14.5)
WBC: 6.1 10*3/uL (ref 3.8–10.6)

## 2017-09-11 MED ORDER — ACETAMINOPHEN 500 MG PO TABS
1000.0000 mg | ORAL_TABLET | Freq: Once | ORAL | Status: AC
Start: 2017-09-11 — End: 2017-09-11
  Administered 2017-09-11: 1000 mg via ORAL
  Filled 2017-09-11: qty 2

## 2017-09-11 MED ORDER — FENTANYL CITRATE (PF) 100 MCG/2ML IJ SOLN
100.0000 ug | Freq: Once | INTRAMUSCULAR | Status: AC
  Administered 2017-09-11: 100 ug via INTRAVENOUS
  Filled 2017-09-11: qty 2

## 2017-09-11 NOTE — ED Provider Notes (Signed)
[facility] Emergency Department Provider Note  ____________________________________________  Time seen: Approximately 8:06 PM  I have reviewed the triage vital signs and the nursing notes.   HISTORY  Chief Complaint Fall   HPI Peter Robertson is a 77 y.o. male with a history of CAD, diabetes, CKD, hypertension, hyperlipidemia who presents for evaluation of back pain status post mechanical fall.  Patient reports that his legs have been giving out for over a year now.  He has had a few falls in the past.  He tries to avoid getting out of the house.  Today he went to his mailbox and as he was walking back his legs gave out and he fell on top of a chair on the [place].  He reports that he fell backwards onto the ground.  He did not hit his head on the ground but denies LOC.  He does not take any blood thinners.  He denies headache or neck pain.  He is complaining of severe constant sharp pain across his lower back worse with any movement.  He denies any prior history of back pain before the fall.  He denies chest pain, dizziness, headache, shortness of breath both preceding or after the fall.   Past Medical History:  Diagnosis Date  . Asthma   . BPH (benign prostatic hyperplasia)   . Chronic kidney disease    Kidney Stones  . Coronary artery disease   . Diabetes mellitus without complication ([place])   . Heart murmur   . Hyperlipidemia   . Hypertension   . Myocardial infarction [facility])     Patient Active Problem List   Diagnosis Date Noted  . Chest pain 01/30/2017    Past Surgical History:  Procedure Laterality Date  . COLONOSCOPY WITH PROPOFOL N/A 12/03/2014   Procedure: COLONOSCOPY WITH PROPOFOL;  Surgeon: Manya Silvas, MD;  Location: [facility] ENDOSCOPY;  Service: Endoscopy;  Laterality: N/A;  . CORONARY ARTERY BYPASS GRAFT      Prior to Admission medications   Medication Sig Start Date End Date Taking? Authorizing Provider  acetaminophen (TYLENOL) 325  MG tablet Take 650 mg by mouth every 6 (six) hours as needed for headache.    [provider]  aspirin EC 81 MG tablet Take 81 mg by mouth every evening.    [provider]  atorvastatin (LIPITOR) 20 MG tablet Take 20 mg by mouth every evening.     [provider]  furosemide (LASIX) 20 MG tablet Take 1 tablet (20 mg total) by mouth daily. do not take the Lasix every day take 1 pill every other day. 06/29/17 06/29/18  Nena Polio, MD  gabapentin (NEURONTIN) 100 MG capsule Take 1 capsule (100 mg total) by mouth 3 (three) times daily. Patient not taking: Reported on 01/30/2017 02/07/16 03/08/16  Rudene Re, MD  HYDROcodone-acetaminophen (NORCO/VICODIN) 5-325 MG tablet Take 1 tablet by mouth every 6 (six) hours as needed for moderate pain. Patient not taking: Reported on 02/07/2016 12/02/15   Jerrol Banana., MD  insulin glargine (LANTUS) 100 UNIT/ML injection Inject 15 Units into the skin at bedtime.    [provider]  lisinopril (PRINIVIL,ZESTRIL) 5 MG tablet Take 5 mg by mouth every evening.     [provider]  metFORMIN (GLUCOPHAGE) 500 MG tablet Take 1,000 mg by mouth 2 (two) times daily with a meal.    [provider]  sertraline (ZOLOFT) 50 MG tablet Take 50 mg by mouth daily.  [provider]  traMADol (ULTRAM) 50 MG tablet Take 1 tablet (50 mg total) by mouth every 6 (six) hours as needed. Patient not taking: Reported on 02/07/2016 12/01/15   Delman Kitten, MD    Allergies Tape  History reviewed. No pertinent family history.  Social History Social History   Tobacco Use  . Smoking status: Former Smoker    Last attempt to quit: 06/19/1984    Years since quitting: 33.2  . Smokeless tobacco: Never Used  Substance Use Topics  . Alcohol use: No  . Drug use: No    Review of Systems Constitutional: Negative for fever. Eyes: Negative for visual changes. ENT: Negative for facial injury or neck  injury Cardiovascular: Negative for chest injury. Respiratory: Negative for shortness of breath. Negative for chest wall injury. Gastrointestinal: Negative for abdominal pain or injury. Genitourinary: Negative for dysuria. Musculoskeletal: + back injury, negative for arm or leg pain. Skin: Negative for laceration/abrasions. Neurological: + head injury.   ____________________________________________   PHYSICAL EXAM:  VITAL SIGNS: ED Triage Vitals  Enc Vitals Group     BP 09/11/17 1908 126/72     Pulse Rate 09/11/17 1908 67     Resp 09/11/17 1908 18     Temp 09/11/17 1908 98 F (36.7 C)     Temp Source 09/11/17 1908 Oral     SpO2 09/11/17 1908 98 %     Weight 09/11/17 1909 175 lb (79.4 kg)     Height 09/11/17 1909 5\' 6"  (1.676 m)     Head Circumference --      Peak Flow --      Pain Score 09/11/17 1909 10     Pain Loc --      Pain Edu? --      Excl. in [place]? --    Full spinal precautions maintained throughout the trauma exam. Constitutional: Alert and oriented. No acute distress. Does not appear intoxicated. HEENT Head: Normocephalic and atraumatic. Face: No facial bony tenderness. Stable midface Ears: No hemotympanum bilaterally. No Battle sign Eyes: No eye injury. R pupil is 1 mm and L pupils is 4 mm (chronic per patient). No raccoon eyes Nose: Nontender. No epistaxis. No rhinorrhea Mouth/Throat: Mucous membranes are moist. No oropharyngeal blood. No dental injury. Airway patent without stridor. Normal voice. Neck: no C-collar in place. No midline c-spine tenderness.  Cardiovascular: Normal rate, regular rhythm. Normal and symmetric distal pulses are present in all extremities. Pulmonary/Chest: Chest wall is stable and nontender to palpation/compression. Normal respiratory effort. Breath sounds are normal. No crepitus.  Abdominal: Soft, nontender, non distended. Musculoskeletal: tender to palpation over the posterior pelvis bilaterally and midline lumbra spine, with no  stepoffs or bruises. Nontender with normal full range of motion in all extremities. No deformities. No thoracic midline spinal tenderness. Pelvis is stable. Skin: Skin is warm, dry and intact. No abrasions or contutions. Psychiatric: Speech and behavior are appropriate. Neurological: Normal speech and language. Moves all extremities to command. No gross focal neurologic deficits are appreciated.  Glascow Coma Score: 4 - Opens eyes on own 6 - Follows simple motor commands 5 - Alert and oriented GCS: 15   ____________________________________________   LABS (all labs ordered are listed, but only abnormal results are displayed)  Labs Reviewed  BASIC METABOLIC PANEL  CBC  URINALYSIS, COMPLETE (UACMP) WITH MICROSCOPIC  CBG MONITORING, ED   ____________________________________________  EKG  ED ECG REPORT I, Rudene Re, the attending physician, personally viewed and interpreted this ECG.  Normal sinus rhythm, rate  of 70, right bundle branch block, normal QTC, normal axis, no ST elevations or depressions.  Unchanged from prior from January 2019 ____________________________________________  RADIOLOGY  I have personally reviewed the images performed during this visit and I agree with the Radiologist's read.   Interpretation by Radiologist:  Ct Head Wo Contrast  Result Date: 09/11/2017 CLINICAL DATA:  Fall EXAM: CT HEAD WITHOUT CONTRAST CT CERVICAL SPINE WITHOUT CONTRAST TECHNIQUE: Multidetector CT imaging of the head and cervical spine was performed following the standard protocol without intravenous contrast. Multiplanar CT image reconstructions of the cervical spine were also generated. COMPARISON:  CT 06/29/2017, MRI 06/29/2017, CT spine 02/03/2012 FINDINGS: CT HEAD FINDINGS Brain: No acute territorial infarction, hemorrhage or intracranial mass is visualized. Mild small vessel ischemic changes of the white matter. Mild to moderate atrophy. Stable ventricle size. Vascular: No  hyperdense vessels.  Carotid vascular calcification Skull: No fracture Sinuses/Orbits: Mucosal thickening in the maxillary and ethmoid sinuses. No acute orbital abnormality Other: Mild posterior scalp soft tissue swelling CT CERVICAL SPINE FINDINGS Alignment: Straightening of the cervical spine. No subluxation. Facet alignment within normal limits. Skull base and vertebrae: No acute fracture. No primary bone lesion or focal pathologic process. Soft tissues and spinal canal: No prevertebral fluid or swelling. No visible canal hematoma. Disc levels: Moderate-to-marked degenerative changes C5-C6 and C6-C7. Mild degenerative changes at C4-C5. Upper chest: Scarring and mild emphysematous disease at the apices. Other: None IMPRESSION: 1. No CT evidence for acute intracranial abnormality. Atrophy and minimal small vessel ischemic changes of the white matter 2. Straightening of the cervical spine with degenerative changes. No definite acute osseous abnormality. Electronically Signed   By: Donavan Foil M.D.   On: 09/11/2017 20:50   Ct Cervical Spine Wo Contrast  Result Date: 09/11/2017 CLINICAL DATA:  Fall EXAM: CT HEAD WITHOUT CONTRAST CT CERVICAL SPINE WITHOUT CONTRAST TECHNIQUE: Multidetector CT imaging of the head and cervical spine was performed following the standard protocol without intravenous contrast. Multiplanar CT image reconstructions of the cervical spine were also generated. COMPARISON:  CT 06/29/2017, MRI 06/29/2017, CT spine 02/03/2012 FINDINGS: CT HEAD FINDINGS Brain: No acute territorial infarction, hemorrhage or intracranial mass is visualized. Mild small vessel ischemic changes of the white matter. Mild to moderate atrophy. Stable ventricle size. Vascular: No hyperdense vessels.  Carotid vascular calcification Skull: No fracture Sinuses/Orbits: Mucosal thickening in the maxillary and ethmoid sinuses. No acute orbital abnormality Other: Mild posterior scalp soft tissue swelling CT CERVICAL SPINE  FINDINGS Alignment: Straightening of the cervical spine. No subluxation. Facet alignment within normal limits. Skull base and vertebrae: No acute fracture. No primary bone lesion or focal pathologic process. Soft tissues and spinal canal: No prevertebral fluid or swelling. No visible canal hematoma. Disc levels: Moderate-to-marked degenerative changes C5-C6 and C6-C7. Mild degenerative changes at C4-C5. Upper chest: Scarring and mild emphysematous disease at the apices. Other: None IMPRESSION: 1. No CT evidence for acute intracranial abnormality. Atrophy and minimal small vessel ischemic changes of the white matter 2. Straightening of the cervical spine with degenerative changes. No definite acute osseous abnormality. Electronically Signed   By: Donavan Foil M.D.   On: 09/11/2017 20:50    ____________________________________________   PROCEDURES  Procedure(s) performed: None Procedures Critical Care performed:  None ____________________________________________   INITIAL IMPRESSION / ASSESSMENT AND PLAN / ED COURSE   77 y.o. male with a history of CAD, diabetes, CKD, hypertension, hyperlipidemia who presents for evaluation of back pain status post mechanical fall.  Patient is well-appearing, looks uncomfortable when moved  around but otherwise in no distress, has normal vital signs, he has severe tenderness to palpation on the midline lumbar spine and also posterior pelvis bilaterally.  No obvious bruising or deformity.  No other injuries on exam.  Patient did not hit his head on the ground.  He does have asymmetric pupils however this is his baseline according to him. Will get CT head and cspine, XR of pelvis and lumbar spine. Will check basic labs to rule out dehydration, anemia, or electrolyte abnormalities as possible cause of fall.  EKG with no dysrhythmias or ischemia.  Will check a UA to rule out UTI.  Will give 100 mcg of fentanyl and 1000 mg of Tylenol for pain control.     _________________________ 8:52 PM on 09/11/2017 -----------------------------------------  Labs and imaging pending. Care transferred to Dr. Archie Balboa.   As part of my medical decision making, I reviewed the following data within the [place] notes reviewed and incorporated, Labs reviewed , EKG interpreted , Old EKG reviewed, Notes from prior ED visits and [place] Controlled Substance Database    Pertinent labs & imaging results that were available during my care of the patient were reviewed by me and considered in my medical decision making (see chart for details).    ____________________________________________   FINAL CLINICAL IMPRESSION(S) / ED DIAGNOSES  Final diagnoses:  Fall, initial encounter      NEW MEDICATIONS STARTED DURING THIS VISIT:  ED Discharge Orders    None       Note:  This document was prepared using Dragon voice recognition software and may include unintentional dictation errors.    Rudene Re, MD 09/11/17 2056

## 2017-09-11 NOTE — ED Triage Notes (Addendum)
Pt presents to ED via [facility] c/o lower back pain following fall today while walking outside. Pt states he felt his legs give out from under him. Denies LOC, denies hitting head. EMS report CBG 85.

## 2017-09-12 MED ORDER — TRAMADOL HCL 50 MG PO TABS: 50.0000 mg | ORAL_TABLET | Freq: Four times a day (QID) | ORAL | 0 refills | Status: DC | PRN

## 2017-09-12 NOTE — ED Provider Notes (Signed)
Patient's imaging shows a L1 compression fracture.  Patient had a LSO brace fitted and did feel better with the.  He was able to stand up.  Will discharge with some tramadol.   Nance Pear, MD 09/12/17 [phone]

## 2017-09-12 NOTE — Discharge Instructions (Addendum)
Please seek medical attention for any high fevers, chest pain, shortness of breath, change in behavior, persistent vomiting, bloody stool or any other new or concerning symptoms.  

## 2017-10-22 ENCOUNTER — Emergency Department
Admission: EM | Admit: 2017-10-22 | Discharge: 2017-10-22 | Disposition: A | Discharge status: Home / Self Care | Payer: Medicare Other | Attending: Emergency Medicine | Admitting: Emergency Medicine

## 2017-10-22 ENCOUNTER — Emergency Department: Payer: Medicare Other

## 2017-10-22 ENCOUNTER — Other Ambulatory Visit: Payer: Self-pay

## 2017-10-22 DIAGNOSIS — E1122 Type 2 diabetes mellitus with diabetic chronic kidney disease: Secondary | ICD-10-CM | POA: Insufficient documentation

## 2017-10-22 DIAGNOSIS — I252 Old myocardial infarction: Secondary | ICD-10-CM | POA: Diagnosis not present

## 2017-10-22 DIAGNOSIS — Y999 Unspecified external cause status: Secondary | ICD-10-CM | POA: Insufficient documentation

## 2017-10-22 DIAGNOSIS — N189 Chronic kidney disease, unspecified: Secondary | ICD-10-CM | POA: Insufficient documentation

## 2017-10-22 DIAGNOSIS — Y929 Unspecified place or not applicable: Secondary | ICD-10-CM | POA: Insufficient documentation

## 2017-10-22 DIAGNOSIS — J45909 Unspecified asthma, uncomplicated: Secondary | ICD-10-CM | POA: Insufficient documentation

## 2017-10-22 DIAGNOSIS — W0110XA Fall on same level from slipping, tripping and stumbling with subsequent striking against unspecified object, initial encounter: Secondary | ICD-10-CM | POA: Insufficient documentation

## 2017-10-22 DIAGNOSIS — Z7982 Long term (current) use of aspirin: Secondary | ICD-10-CM | POA: Diagnosis not present

## 2017-10-22 DIAGNOSIS — W19XXXA Unspecified fall, initial encounter: Secondary | ICD-10-CM

## 2017-10-22 DIAGNOSIS — Y9301 Activity, walking, marching and hiking: Secondary | ICD-10-CM | POA: Insufficient documentation

## 2017-10-22 DIAGNOSIS — Z794 Long term (current) use of insulin: Secondary | ICD-10-CM | POA: Insufficient documentation

## 2017-10-22 DIAGNOSIS — R531 Weakness: Secondary | ICD-10-CM | POA: Diagnosis present

## 2017-10-22 DIAGNOSIS — I2510 Atherosclerotic heart disease of native coronary artery without angina pectoris: Secondary | ICD-10-CM | POA: Insufficient documentation

## 2017-10-22 DIAGNOSIS — Z87891 Personal history of nicotine dependence: Secondary | ICD-10-CM | POA: Insufficient documentation

## 2017-10-22 DIAGNOSIS — I129 Hypertensive chronic kidney disease with stage 1 through stage 4 chronic kidney disease, or unspecified chronic kidney disease: Secondary | ICD-10-CM | POA: Insufficient documentation

## 2017-10-22 LAB — BASIC METABOLIC PANEL
Anion gap: 7 (ref 5–15)
BUN: 41 mg/dL — ABNORMAL HIGH (ref 6–20)
CO2: 21 mmol/L — ABNORMAL LOW (ref 22–32)
Calcium: 9.2 mg/dL (ref 8.9–10.3)
Chloride: 108 mmol/L (ref 101–111)
Creatinine, Ser: 1.35 mg/dL — ABNORMAL HIGH (ref 0.61–1.24)
GFR calc Af Amer: 56 mL/min — ABNORMAL LOW (ref >60)
GFR calc non Af Amer: 49 mL/min — ABNORMAL LOW (ref >60)
Glucose, Bld: 174 mg/dL — ABNORMAL HIGH (ref 65–99)
Potassium: 5.4 mmol/L — ABNORMAL HIGH (ref 3.5–5.1)
Sodium: 136 mmol/L (ref 135–145)

## 2017-10-22 LAB — URINALYSIS, COMPLETE (UACMP) WITH MICROSCOPIC
Bacteria, UA: NONE SEEN
Bilirubin Urine: NEGATIVE
Glucose, UA: NEGATIVE mg/dL
Hgb urine dipstick: NEGATIVE
Ketones, ur: NEGATIVE mg/dL
Leukocytes, UA: NEGATIVE
Nitrite: NEGATIVE
Protein, ur: NEGATIVE mg/dL
Specific Gravity, Urine: 1.016 (ref 1.005–1.030)
Squamous Epithelial / LPF: NONE SEEN (ref 0–5)
pH: 5 (ref 5.0–8.0)

## 2017-10-22 LAB — CBC
HCT: 37.3 % — ABNORMAL LOW (ref 40.0–52.0)
Hemoglobin: 12.9 g/dL — ABNORMAL LOW (ref 13.0–18.0)
MCH: 29.1 pg (ref 26.0–34.0)
MCHC: 34.6 g/dL (ref 32.0–36.0)
MCV: 84.2 fL (ref 80.0–100.0)
Platelets: 129 10*3/uL — ABNORMAL LOW (ref 150–440)
RBC: 4.43 MIL/uL (ref 4.40–5.90)
RDW: 15 % — ABNORMAL HIGH (ref 11.5–14.5)
WBC: 8.2 10*3/uL (ref 3.8–10.6)

## 2017-10-22 NOTE — ED Provider Notes (Addendum)
[facility] Emergency Department Provider Note  Time seen: 12:47 PM  I have reviewed the triage vital signs and the nursing notes.   HISTORY  Chief Complaint Fall    HPI Peter Robertson is a 77 y.o. male with a past medical history of CKD, CAD, diabetes, hypertension, hyperlipidemia, presents to the emergency department after a fall.  According to the patient for the past 1 to 2 years he has been having intermittent falls in which he states his legs just give out.  Today he was walking when his legs got shaking gave out he landed on both of his knees.  Denies hitting his head denies loss of consciousness.  Patient states pain in both of his knees and weakness in his legs has not been able to ambulate since the fall presented via EMS.  Describes his knee pain is moderate dull pain worse with movement.  States weakness in his legs bilaterally.   Past Medical History:  Diagnosis Date  . Asthma   . BPH (benign prostatic hyperplasia)   . Chronic kidney disease    Kidney Stones  . Coronary artery disease   . Diabetes mellitus without complication ([place])   . Heart murmur   . Hyperlipidemia   . Hypertension   . Myocardial infarction [facility])     Patient Active Problem List   Diagnosis Date Noted  . Chest pain 01/30/2017    Past Surgical History:  Procedure Laterality Date  . COLONOSCOPY WITH PROPOFOL N/A 12/03/2014   Procedure: COLONOSCOPY WITH PROPOFOL;  Surgeon: Manya Silvas, MD;  Location: [facility] ENDOSCOPY;  Service: Endoscopy;  Laterality: N/A;  . CORONARY ARTERY BYPASS GRAFT      Prior to Admission medications   Medication Sig Start Date End Date Taking? Authorizing Provider  acetaminophen (TYLENOL) 325 MG tablet Take 650 mg by mouth every 6 (six) hours as needed for headache.    [provider]  aspirin EC 81 MG tablet Take 81 mg by mouth every evening.    [provider]  atorvastatin (LIPITOR) 20 MG tablet Take 20 mg by mouth every  evening.     [provider]  furosemide (LASIX) 20 MG tablet Take 1 tablet (20 mg total) by mouth daily. do not take the Lasix every day take 1 pill every other day. 06/29/17 06/29/18  Nena Polio, MD  gabapentin (NEURONTIN) 100 MG capsule Take 1 capsule (100 mg total) by mouth 3 (three) times daily. Patient not taking: Reported on 01/30/2017 02/07/16 03/08/16  Rudene Re, MD  HYDROcodone-acetaminophen (NORCO/VICODIN) 5-325 MG tablet Take 1 tablet by mouth every 6 (six) hours as needed for moderate pain. Patient not taking: Reported on 02/07/2016 12/02/15   Jerrol Banana., MD  insulin glargine (LANTUS) 100 UNIT/ML injection Inject 15 Units into the skin at bedtime.    [provider]  lisinopril (PRINIVIL,ZESTRIL) 5 MG tablet Take 5 mg by mouth every evening.     [provider]  metFORMIN (GLUCOPHAGE) 500 MG tablet Take 1,000 mg by mouth 2 (two) times daily with a meal.    [provider]  sertraline (ZOLOFT) 50 MG tablet Take 50 mg by mouth daily.    [provider]  traMADol (ULTRAM) 50 MG tablet Take 1 tablet (50 mg total) by mouth every 6 (six) hours as needed. Patient not taking: Reported on 02/07/2016 12/01/15   Delman Kitten, MD  traMADol (ULTRAM) 50 MG tablet Take 1 tablet (50 mg total) by mouth  every 6 (six) hours as needed. 09/12/17 09/12/18  Nance Pear, MD    Allergies  Allergen Reactions  . Tape Rash    blisters    No family history on file.  Social History Social History   Tobacco Use  . Smoking status: Former Smoker    Last attempt to quit: 06/19/1984    Years since quitting: 33.3  . Smokeless tobacco: Never Used  Substance Use Topics  . Alcohol use: No  . Drug use: No    Review of Systems Constitutional: Negative for fever.  Positive for lower extremity weakness bilaterally. Eyes: Negative for visual complaints ENT: Negative for recent illness/congestion Cardiovascular: Negative for chest  pain. Respiratory: Negative for shortness of breath. Gastrointestinal: Negative for abdominal pain, vomiting Genitourinary: Negative for urinary compaints Musculoskeletal: Bilateral knee pain Skin: Negative for skin complaints  Neurological: Negative for headache positive for lower extremity weakness bilaterally. All other ROS negative  ____________________________________________   PHYSICAL EXAM:  VITAL SIGNS: ED Triage Vitals [10/22/17 1215]  Enc Vitals Group     BP (!) 141/112     Pulse Rate 76     Resp 16     Temp 98.2 F (36.8 C)     Temp Source Oral     SpO2 97 %     Weight 150 lb (68 kg)     Height 5\' 6"  (1.676 m)     Head Circumference      Peak Flow      Pain Score 9     Pain Loc      Pain Edu?      Excl. in [place]?    Constitutional: Alert and oriented. Well appearing and in no distress. Eyes: Normal exam ENT   Head: Normocephalic and atraumatic.   Mouth/Throat: Mucous membranes are moist. Cardiovascular: Normal rate, regular rhythm. No murmur Respiratory: Normal respiratory effort without tachypnea nor retractions. Breath sounds are clear  Gastrointestinal: Soft and nontender. No distention.   Musculoskeletal: Mild knee tenderness bilaterally.  States mild pain with range of motion of both knees. Neurologic:  Normal speech and language. No gross focal neurologic deficits  Skin:  Skin is warm, dry and intact.  Psychiatric: Mood and affect are normal.   ____________________________________________    RADIOLOGY  X-rays are negative  ____________________________________________   INITIAL IMPRESSION / ASSESSMENT AND PLAN / ED COURSE  Pertinent labs & imaging results that were available during my care of the patient were reviewed by me and considered in my medical decision making (see chart for details).  She states he was walking today when his legs got weak and gave out from below.  Patient states this has been an ongoing issue over the past 2  years.  Last occurred approximately 3 weeks ago.  In reviewing the patient's records he has been to the emergency department multiple times for similar complaints.  Given the acute fall we will check labs, urinalysis, x-rays of both of his knees and continue to closely monitor.  Patient agreeable to this plan of care.  X-rays are negative.  Overall patient appears well this appears to be a recurrent issue for the patient.  Lab work is largely unchanged from baseline, potassium is slightly elevated although largely unchanged from last check and the sample is hemolyzed likely representing a false elevation.  Overall the patient appears well, answering questions and following commands very well.  We will ensure the patient is able to ambulate if so anticipate discharge home with PCP follow-up.  I discussed this plan of care with the patient.  He is agreeable.  EKG reviewed and interpreted by myself shows normal sinus rhythm at 74 bpm with a widened QRS, normal axis, largely normal intervals, nonspecific ST changes.  ____________________________________________   FINAL CLINICAL IMPRESSION(S) / ED DIAGNOSES  Fall Lower extremity weakness    Harvest Dark, MD 10/22/17 1514    Harvest Dark, MD 10/30/17 [phone]

## 2017-10-22 NOTE — ED Notes (Signed)
Pt discharged home after verbalizing understanding of discharge instructions; nad noted. 

## 2017-10-22 NOTE — ED Triage Notes (Signed)
Pt via ems from home after suffering a fall. 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 Denies LOC or hitting his head. Pt states he has been seen for the same with no resolution or diagnosis. Pt alert & oriented with NAD noted.

## 2017-10-22 NOTE — ED Notes (Signed)
Pt ambulated with walker; no difficulties noted.

## 2017-10-22 NOTE — Discharge Instructions (Addendum)
Please follow-up with your doctor within the next 1 to 2 days for recheck/reevaluation.  Please drink plenty of fluids and use a walker when ambulating.

## 2018-05-20 ENCOUNTER — Encounter: Payer: Self-pay | Admitting: Emergency Medicine

## 2018-05-20 ENCOUNTER — Emergency Department: Payer: Medicare Other

## 2018-05-20 ENCOUNTER — Other Ambulatory Visit: Payer: Self-pay

## 2018-05-20 ENCOUNTER — Inpatient Hospital Stay
Admission: EM | Admit: 2018-05-20 | Discharge: 2018-05-22 | DRG: 287 | Disposition: A | Discharge status: Home Health | Payer: Medicare Other | Attending: Internal Medicine | Admitting: Internal Medicine

## 2018-05-20 DIAGNOSIS — Z794 Long term (current) use of insulin: Secondary | ICD-10-CM | POA: Diagnosis not present

## 2018-05-20 DIAGNOSIS — Z833 Family history of diabetes mellitus: Secondary | ICD-10-CM | POA: Diagnosis not present

## 2018-05-20 DIAGNOSIS — D696 Thrombocytopenia, unspecified: Secondary | ICD-10-CM | POA: Diagnosis present

## 2018-05-20 DIAGNOSIS — J449 Chronic obstructive pulmonary disease, unspecified: Secondary | ICD-10-CM | POA: Diagnosis present

## 2018-05-20 DIAGNOSIS — I25110 Atherosclerotic heart disease of native coronary artery with unstable angina pectoris: Principal | ICD-10-CM | POA: Diagnosis present

## 2018-05-20 DIAGNOSIS — N400 Enlarged prostate without lower urinary tract symptoms: Secondary | ICD-10-CM | POA: Diagnosis present

## 2018-05-20 DIAGNOSIS — I4510 Unspecified right bundle-branch block: Secondary | ICD-10-CM | POA: Diagnosis present

## 2018-05-20 DIAGNOSIS — N183 Chronic kidney disease, stage 3 (moderate): Secondary | ICD-10-CM | POA: Diagnosis present

## 2018-05-20 DIAGNOSIS — Z7982 Long term (current) use of aspirin: Secondary | ICD-10-CM

## 2018-05-20 DIAGNOSIS — R072 Precordial pain: Secondary | ICD-10-CM

## 2018-05-20 DIAGNOSIS — Z79899 Other long term (current) drug therapy: Secondary | ICD-10-CM

## 2018-05-20 DIAGNOSIS — I129 Hypertensive chronic kidney disease with stage 1 through stage 4 chronic kidney disease, or unspecified chronic kidney disease: Secondary | ICD-10-CM | POA: Diagnosis present

## 2018-05-20 DIAGNOSIS — Z951 Presence of aortocoronary bypass graft: Secondary | ICD-10-CM | POA: Diagnosis not present

## 2018-05-20 DIAGNOSIS — Z87891 Personal history of nicotine dependence: Secondary | ICD-10-CM

## 2018-05-20 DIAGNOSIS — I252 Old myocardial infarction: Secondary | ICD-10-CM

## 2018-05-20 DIAGNOSIS — E785 Hyperlipidemia, unspecified: Secondary | ICD-10-CM | POA: Diagnosis present

## 2018-05-20 DIAGNOSIS — I25810 Atherosclerosis of coronary artery bypass graft(s) without angina pectoris: Secondary | ICD-10-CM | POA: Diagnosis present

## 2018-05-20 DIAGNOSIS — Z87442 Personal history of urinary calculi: Secondary | ICD-10-CM

## 2018-05-20 DIAGNOSIS — Z91048 Other nonmedicinal substance allergy status: Secondary | ICD-10-CM

## 2018-05-20 DIAGNOSIS — E1122 Type 2 diabetes mellitus with diabetic chronic kidney disease: Secondary | ICD-10-CM | POA: Diagnosis present

## 2018-05-20 DIAGNOSIS — I200 Unstable angina: Secondary | ICD-10-CM | POA: Diagnosis not present

## 2018-05-20 DIAGNOSIS — R079 Chest pain, unspecified: Secondary | ICD-10-CM | POA: Diagnosis not present

## 2018-05-20 LAB — CBC
HCT: 38.3 % — ABNORMAL LOW (ref 39.0–52.0)
Hemoglobin: 13 g/dL (ref 13.0–17.0)
MCH: 28.2 pg (ref 26.0–34.0)
MCHC: 33.9 g/dL (ref 30.0–36.0)
MCV: 83.1 fL (ref 80.0–100.0)
Platelets: 105 10*3/uL — ABNORMAL LOW (ref 150–400)
RBC: 4.61 MIL/uL (ref 4.22–5.81)
RDW: 14.3 % (ref 11.5–15.5)
WBC: 6.8 10*3/uL (ref 4.0–10.5)

## 2018-05-20 LAB — PROTIME-INR
INR: 1.07
Prothrombin Time: 13.8 seconds (ref 11.4–15.2)

## 2018-05-20 LAB — GLUCOSE, CAPILLARY
Glucose-Capillary: 165 mg/dL — ABNORMAL HIGH (ref 70–99)
Glucose-Capillary: 300 mg/dL — ABNORMAL HIGH (ref 70–99)
Glucose-Capillary: 334 mg/dL — ABNORMAL HIGH (ref 70–99)

## 2018-05-20 LAB — COMPREHENSIVE METABOLIC PANEL
ALT: 23 U/L (ref 0–44)
AST: 17 U/L (ref 15–41)
Albumin: 4.3 g/dL (ref 3.5–5.0)
Alkaline Phosphatase: 66 U/L (ref 38–126)
Anion gap: 8 (ref 5–15)
BUN: 33 mg/dL — ABNORMAL HIGH (ref 8–23)
CO2: 25 mmol/L (ref 22–32)
Calcium: 9.3 mg/dL (ref 8.9–10.3)
Chloride: 102 mmol/L (ref 98–111)
Creatinine, Ser: 1.26 mg/dL — ABNORMAL HIGH (ref 0.61–1.24)
GFR calc Af Amer: >60 mL/min (ref >60)
GFR calc non Af Amer: 54 mL/min — ABNORMAL LOW (ref >60)
Glucose, Bld: 344 mg/dL — ABNORMAL HIGH (ref 70–99)
Potassium: 4.8 mmol/L (ref 3.5–5.1)
Sodium: 135 mmol/L (ref 135–145)
Total Bilirubin: 0.9 mg/dL (ref 0.3–1.2)
Total Protein: 6.9 g/dL (ref 6.5–8.1)

## 2018-05-20 LAB — TROPONIN I
Troponin I: <0.03 ng/mL (ref <0.03)
Troponin I: <0.03 ng/mL (ref <0.03)

## 2018-05-20 LAB — APTT: aPTT: 31 seconds (ref 24–36)

## 2018-05-20 MED ORDER — ASPIRIN 300 MG RE SUPP: 300.0000 mg | RECTAL | Status: AC

## 2018-05-20 MED ORDER — INSULIN ASPART 100 UNIT/ML ~~LOC~~ SOLN
0.0000 [IU] | Freq: Three times a day (TID) | SUBCUTANEOUS | Status: DC
  Administered 2018-05-20: 7 [IU] via SUBCUTANEOUS
  Administered 2018-05-21: 2 [IU] via SUBCUTANEOUS
  Administered 2018-05-21: 1 [IU] via SUBCUTANEOUS
  Administered 2018-05-21: 2 [IU] via SUBCUTANEOUS
  Administered 2018-05-22: 1 [IU] via SUBCUTANEOUS
  Filled 2018-05-20 (×4): qty 1

## 2018-05-20 MED ORDER — INSULIN GLARGINE 100 UNIT/ML ~~LOC~~ SOLN
24.0000 [IU] | Freq: Every day | SUBCUTANEOUS | Status: DC
  Administered 2018-05-21: 24 [IU] via SUBCUTANEOUS
  Filled 2018-05-20 (×3): qty 0.24

## 2018-05-20 MED ORDER — INSULIN ASPART 100 UNIT/ML ~~LOC~~ SOLN
SUBCUTANEOUS | Status: AC
  Administered 2018-05-20: 7 [IU] via SUBCUTANEOUS
  Filled 2018-05-20: qty 1

## 2018-05-20 MED ORDER — CLOPIDOGREL BISULFATE 75 MG PO TABS
75.0000 mg | ORAL_TABLET | Freq: Every day | ORAL | Status: DC
  Administered 2018-05-22: 75 mg via ORAL
  Filled 2018-05-20: qty 1

## 2018-05-20 MED ORDER — ASPIRIN 81 MG PO CHEW: 324.0000 mg | CHEWABLE_TABLET | ORAL | Status: AC

## 2018-05-20 MED ORDER — ATORVASTATIN CALCIUM 20 MG PO TABS
80.0000 mg | ORAL_TABLET | Freq: Every day | ORAL | Status: DC
  Administered 2018-05-20 – 2018-05-21 (×2): 80 mg via ORAL
  Filled 2018-05-20 (×2): qty 4

## 2018-05-20 MED ORDER — ACETAMINOPHEN 325 MG PO TABS
650.0000 mg | ORAL_TABLET | ORAL | Status: DC | PRN
  Administered 2018-05-20 – 2018-05-21 (×3): 650 mg via ORAL
  Filled 2018-05-20 (×3): qty 2

## 2018-05-20 MED ORDER — ASPIRIN EC 81 MG PO TBEC: 81.0000 mg | DELAYED_RELEASE_TABLET | Freq: Every day | ORAL | Status: DC

## 2018-05-20 MED ORDER — ZOLPIDEM TARTRATE 5 MG PO TABS: 5.0000 mg | ORAL_TABLET | Freq: Every evening | ORAL | Status: DC | PRN

## 2018-05-20 MED ORDER — INSULIN ASPART 100 UNIT/ML ~~LOC~~ SOLN: 0.0000 [IU] | Freq: Every day | SUBCUTANEOUS | Status: DC

## 2018-05-20 MED ORDER — NITROGLYCERIN 0.4 MG SL SUBL
0.4000 mg | SUBLINGUAL_TABLET | SUBLINGUAL | Status: DC | PRN
Start: 2018-05-20 — End: 2018-05-22
  Administered 2018-05-20 (×3): 0.4 mg via SUBLINGUAL
  Filled 2018-05-20: qty 1

## 2018-05-20 MED ORDER — ALPRAZOLAM 0.25 MG PO TABS: 0.2500 mg | ORAL_TABLET | Freq: Two times a day (BID) | ORAL | Status: DC | PRN

## 2018-05-20 MED ORDER — TAMSULOSIN HCL 0.4 MG PO CAPS
0.4000 mg | ORAL_CAPSULE | Freq: Every day | ORAL | Status: DC
  Administered 2018-05-20 – 2018-05-21 (×2): 0.4 mg via ORAL
  Filled 2018-05-20 (×2): qty 1

## 2018-05-20 MED ORDER — ONDANSETRON HCL 4 MG/2ML IJ SOLN: 4.0000 mg | Freq: Four times a day (QID) | INTRAMUSCULAR | Status: DC | PRN

## 2018-05-20 MED ORDER — SERTRALINE HCL 50 MG PO TABS
150.0000 mg | ORAL_TABLET | Freq: Every day | ORAL | Status: DC
  Administered 2018-05-20 – 2018-05-21 (×2): 150 mg via ORAL
  Filled 2018-05-20 (×2): qty 3

## 2018-05-20 MED ORDER — NITROGLYCERIN IN D5W 200-5 MCG/ML-% IV SOLN
0.0000 ug/min | INTRAVENOUS | Status: DC
  Administered 2018-05-20: 30 ug/min via INTRAVENOUS
  Filled 2018-05-20: qty 250

## 2018-05-20 MED ORDER — VITAMIN D3 25 MCG (1000 UNIT) PO TABS
1000.0000 [IU] | ORAL_TABLET | Freq: Two times a day (BID) | ORAL | Status: DC
  Administered 2018-05-21: 1000 [IU] via ORAL
  Filled 2018-05-20 (×5): qty 1

## 2018-05-20 MED ORDER — NITROGLYCERIN 0.4 MG SL SUBL: 0.4000 mg | SUBLINGUAL_TABLET | SUBLINGUAL | Status: DC | PRN

## 2018-05-20 MED ORDER — HEPARIN BOLUS VIA INFUSION
4000.0000 [IU] | Freq: Once | INTRAVENOUS | Status: AC
  Administered 2018-05-20: 4000 [IU] via INTRAVENOUS
  Filled 2018-05-20: qty 4000

## 2018-05-20 MED ORDER — HEPARIN (PORCINE) 25000 UT/250ML-% IV SOLN
1300.0000 [IU]/h | INTRAVENOUS | Status: DC
  Administered 2018-05-20: 1100 [IU]/h via INTRAVENOUS
  Administered 2018-05-21: 1200 [IU]/h via INTRAVENOUS
  Administered 2018-05-22: 1300 [IU]/h via INTRAVENOUS
  Filled 2018-05-20 (×4): qty 250

## 2018-05-20 MED ORDER — LISINOPRIL 5 MG PO TABS
5.0000 mg | ORAL_TABLET | Freq: Every day | ORAL | Status: DC
  Administered 2018-05-21: 5 mg via ORAL
  Filled 2018-05-20 (×2): qty 1

## 2018-05-20 MED ORDER — SODIUM CHLORIDE 0.9 % IV SOLN
INTRAVENOUS | Status: DC
  Administered 2018-05-21 (×2): via INTRAVENOUS

## 2018-05-20 MED ORDER — METOPROLOL SUCCINATE ER 50 MG PO TB24
50.0000 mg | ORAL_TABLET | Freq: Every day | ORAL | Status: DC
  Administered 2018-05-21: 50 mg via ORAL
  Filled 2018-05-20 (×2): qty 1

## 2018-05-20 NOTE — ED Provider Notes (Signed)
[facility] Emergency Department Provider Note  ____________________________________________  Time seen: Approximately 2:55 PM  I have reviewed the triage vital signs and the nursing notes.   HISTORY  Chief Complaint Chest Pain    HPI Peter Robertson is a 77 y.o. male with a history of CKD diabetes hypertension known multivessel CAD who complains of chest pain that woke him up at 7 AM this morning.  It feels like previous episodes of angina and that he has had in the past that normally resolve with taking nitroglycerin.  Today he took nitroglycerin, and it did temporarily improve the pain but it has worsened again after a few minutes and had been constant.  Radiates to the left arm.  Associate with shortness of breath.  No aggravating or alleviating factors.  No vomiting or diaphoresis.  No dizziness or syncope.  Feels like a "knot" in the middle of the chest.      Past Medical History:  Diagnosis Date  . Asthma   . BPH (benign prostatic hyperplasia)   . Chronic kidney disease    Kidney Stones  . Coronary artery disease   . Diabetes mellitus without complication ([place])   . Heart murmur   . Hyperlipidemia   . Hypertension   . Myocardial infarction [facility])      Patient Active Problem List   Diagnosis Date Noted  . Chest pain 01/30/2017     Past Surgical History:  Procedure Laterality Date  . COLONOSCOPY WITH PROPOFOL N/A 12/03/2014   Procedure: COLONOSCOPY WITH PROPOFOL;  Surgeon: Manya Silvas, MD;  Location[facility] ENDOSCOPY;  Service: Endoscopy;  Laterality: N/A;  . CORONARY ARTERY BYPASS GRAFT       Prior to Admission medications   Medication Sig Start Date End Date Taking? Authorizing Provider  acetaminophen (TYLENOL) 325 MG tablet Take 650 mg by mouth every 6 (six) hours as needed for headache.    [provider]  aspirin EC 81 MG tablet Take 81 mg by mouth every evening.    [provider]  atorvastatin (LIPITOR) 20 MG  tablet Take 20 mg by mouth every evening.     [provider]  furosemide (LASIX) 20 MG tablet Take 1 tablet (20 mg total) by mouth daily. do not take the Lasix every day take 1 pill every other day. 06/29/17 06/29/18  Nena Polio, MD  gabapentin (NEURONTIN) 100 MG capsule Take 1 capsule (100 mg total) by mouth 3 (three) times daily. Patient not taking: Reported on 01/30/2017 02/07/16 03/08/16  Rudene Re, MD  HYDROcodone-acetaminophen (NORCO/VICODIN) 5-325 MG tablet Take 1 tablet by mouth every 6 (six) hours as needed for moderate pain. Patient not taking: Reported on 02/07/2016 12/02/15   Jerrol Banana., MD  insulin glargine (LANTUS) 100 UNIT/ML injection Inject 15 Units into the skin at bedtime.    [provider]  lisinopril (PRINIVIL,ZESTRIL) 5 MG tablet Take 5 mg by mouth every evening.     [provider]  metFORMIN (GLUCOPHAGE) 500 MG tablet Take 1,000 mg by mouth 2 (two) times daily with a meal.    [provider]  sertraline (ZOLOFT) 50 MG tablet Take 50 mg by mouth daily.    [provider]  traMADol (ULTRAM) 50 MG tablet Take 1 tablet (50 mg total) by mouth every 6 (six) hours as needed. Patient not taking: Reported on 02/07/2016 12/01/15   Delman Kitten, MD  traMADol (ULTRAM) 50 MG tablet Take 1 tablet (50 mg total) by  mouth every 6 (six) hours as needed. 09/12/17 09/12/18  Nance Pear, MD     Allergies Tape   No family history on file.  Social History Social History   Tobacco Use  . Smoking status: Former Smoker    Last attempt to quit: 06/19/1984    Years since quitting: 33.9  . Smokeless tobacco: Never Used  Substance Use Topics  . Alcohol use: No  . Drug use: No    Review of Systems  Constitutional:   No fever or chills.  ENT:   No sore throat. No rhinorrhea. Cardiovascular:   Positive as above chest pain without syncope. Respiratory:   No dyspnea or cough. Gastrointestinal:   Negative for abdominal pain,  vomiting and diarrhea.  Musculoskeletal:   Negative for focal pain or swelling All other systems reviewed and are negative except as documented above in ROS and HPI.  ____________________________________________   PHYSICAL EXAM:  VITAL SIGNS: ED Triage Vitals  Enc Vitals Group     BP 05/20/18 1246 (!) 154/97     Pulse Rate 05/20/18 1246 72     Resp 05/20/18 1246 18     Temp 05/20/18 1246 97.9 F (36.6 C)     Temp Source 05/20/18 1246 Oral     SpO2 05/20/18 1246 99 %     Weight 05/20/18 1247 198 lb (89.8 kg)     Height 05/20/18 1247 5\' 6"  (1.676 m)     Head Circumference --      Peak Flow --      Pain Score 05/20/18 1247 9     Pain Loc --      Pain Edu? --      Excl. in [place]? --     Vital signs reviewed, nursing assessments reviewed.   Constitutional:   Alert and oriented. Non-toxic appearance. Eyes:   Conjunctivae are normal. EOMI. PERRL. ENT      Head:   Normocephalic and atraumatic.      Nose:   No congestion/rhinnorhea.       Mouth/Throat:   MMM, no pharyngeal erythema. No peritonsillar mass.       Neck:   No meningismus. Full ROM. Hematological/Lymphatic/Immunilogical:   No cervical lymphadenopathy. Cardiovascular:   RRR. Symmetric bilateral radial and DP pulses.  No murmurs. Cap refill less than 2 seconds. Respiratory:   Normal respiratory effort without tachypnea/retractions. Breath sounds are clear and equal bilaterally. No wheezes/rales/rhonchi. Gastrointestinal:   Soft and nontender. Non distended. There is no CVA tenderness.  No rebound, rigidity, or guarding.  Musculoskeletal:   Normal range of motion in all extremities. No joint effusions.  No lower extremity tenderness.  No edema. Neurologic:   Normal speech and language.  Motor grossly intact. No acute focal neurologic deficits are appreciated.  Skin:    Skin is warm, dry and intact. No rash noted.  No petechiae, purpura, or bullae.  ____________________________________________    LABS (pertinent  positives/negatives) (all labs ordered are listed, but only abnormal results are displayed) Labs Reviewed  CBC - Abnormal; Notable for the following components:      Result Value   HCT 38.3 (*)    Platelets 105 (*)    All other components within normal limits  COMPREHENSIVE METABOLIC PANEL - Abnormal; Notable for the following components:   Glucose, Bld 344 (*)    BUN 33 (*)    Creatinine, Ser 1.26 (*)    GFR calc non Af Amer 54 (*)    All other components within normal  limits  TROPONIN I  PROTIME-INR   ____________________________________________   EKG  Interpreted by me Sinus rhythm rate of 70.  Right axis, right bundle branch block.  Normal ST segments and T waves.  No acute ischemic changes.  ____________________________________________    ZOXWRUEAV  Dg Chest Portable 1 View  Result Date: 05/20/2018 CLINICAL DATA:  Left-sided chest pain beginning this morning. EXAM: PORTABLE CHEST 1 VIEW COMPARISON:  06/29/2017, 01/30/2017 and CT 05/21/2014 FINDINGS: Sternotomy wires unchanged. Lungs are adequately inflated and demonstrate patchy bilateral nodular opacification and calcification improved from the previous exams and compatible with known sarcoidosis. No acute changes. Cardiomediastinal silhouette and remainder the exam is unchanged. IMPRESSION: No acute findings. Patchy bilateral nodular opacification and calcification improved and compatible with known sarcoidosis Electronically Signed   By: Marin Olp M.D.   On: 05/20/2018 13:13    ____________________________________________   PROCEDURES .Critical Care Performed by: Carrie Mew, MD Authorized by: Carrie Mew, MD   Critical care provider statement:    Critical care time (minutes):  35   Critical care time was exclusive of:  Separately billable procedures and treating other patients   Critical care was necessary to treat or prevent imminent or life-threatening deterioration of the following conditions:   Cardiac failure   Critical care was time spent personally by me on the following activities:  Development of treatment plan with patient or surrogate, discussions with consultants, evaluation of patient's response to treatment, examination of patient, obtaining history from patient or surrogate, ordering and performing treatments and interventions, ordering and review of laboratory studies, ordering and review of radiographic studies, pulse oximetry, re-evaluation of patient's condition and review of old charts    ____________________________________________  DIFFERENTIAL DIAGNOSIS   Non-STEMI, unstable angina, pneumonia.  Unlikely aortic dissection, carditis, pericardial effusion, or pneumothorax or PE  CLINICAL IMPRESSION / ASSESSMENT AND PLAN / ED COURSE  Pertinent labs & imaging results that were available during my care of the patient were reviewed by me and considered in my medical decision making (see chart for details).    Patient presents with precordial chest pain concerning for unstable angina.  Labs are unremarkable.  Vital signs are unremarkable.  Initial nitroglycerin in the ED showed some minimal improvement of pain but also decreased blood pressure substantially while only improving his pain from 9/10 to 8/10.  The symptoms combined with his known multivessel CAD are concerning for unstable angina, start heparin and nitroglycerin drips, plan to admit for further cardiac work-up.      ____________________________________________   FINAL CLINICAL IMPRESSION(S) / ED DIAGNOSES    Final diagnoses:  Precordial pain  Unstable angina [facility])     ED Discharge Orders    None      Portions of this note were generated with dragon dictation software. Dictation errors may occur despite best attempts at proofreading.    Carrie Mew, MD 05/20/18 1459

## 2018-05-20 NOTE — Progress Notes (Addendum)
Patient requested transfer to [facility] and has been accepted, ED physician has coordinated transfer, will d/c admission order.  Update - Patient waiting for stepdown bed at [facility], admit order re-entered until patient can get bed assigned and be transferred.  Jacqulyn Bath [facility] 05/20/2018, 11:26 PM

## 2018-05-20 NOTE — ED Provider Notes (Addendum)
Family asked to go to [facility] as his cardiologist is there.  [facility] is contacted.  Cardiology is currently in her procedure will not be done for over an hour.  [facility] is also full and the patient will be placed on a wait list pending discharges.  This will probably not happen until morning.  We will get the patient upstairs.   Nena Polio, MD 05/20/18 2108 I informed the patient and family of this.   Nena Polio, MD 05/20/18 2108

## 2018-05-20 NOTE — Progress Notes (Signed)
Advanced Care Plan.  Purpose of Encounter: CODE STATUS. Parties in Attendance: The patient and me. Patient's Decisional Capacity: Yes. Medical Story: Peter Robertson  is a 77 y.o. male with a known history of asthma, BPH, CKD, CAD, hypertension, diabetes, hyperlipidemia and MI.    The patient is being admitted for unstable angina, on heparin drip.  I discussed with patient about patient current condition, prognosis and CODE STATUS.  The patient does want to be resuscitated and intubated if he has cardiopulmonary arrest. Plan:  Code Status: Full code. Time spent discussing advance care planning:17 minutes.

## 2018-05-20 NOTE — ED Triage Notes (Signed)
Awoke about 7 am with L sided chest pain, continues.

## 2018-05-20 NOTE — Consult Note (Signed)
Cardiology Consultation:   Patient ID: Peter Robertson MRN: [id number]; DOB: 31-Jul-1940  Admit date: 05/20/2018 Date of Consult: 05/20/2018  Primary Care Provider: System, Pcp Not In Primary Cardiologist: [facility] Primary Electrophysiologist:  None    Patient Profile:   Peter Robertson is a 77 y.o. male with a hx of coronary artery disease status post PCI and CABG who is being seen today for the evaluation of unstable angina at the request of Dr. Bridgett Larsson.  History of Present Illness:   Peter Robertson is a 77 year old male with known history of coronary artery disease status post PCI and CABG in 2011 with LIMA to LAD and SVG to OM 2, type 2 diabetes, hypertension, chronic kidney disease and COPD. The patient had previous cardiac catheterization done in May 2018 at [facility] which showed 50% distal left main stenosis, 80% proximal ramus stenosis, occluded LAD with patent LIMA to LAD and occluded SVG to OM1.  The patient was treated medically. He used to follow-up with Dr. Clayborn Bigness in the past but most recently has been following up at [facility].  He was seen recently by cardiology for increased angina and underwent a nuclear stress test which showed evidence of anterior wall ischemia.  The patient was scheduled for cardiac catheterization to be done in few weeks.  He reports having exertional chest pain over the last 3 weeks that typically responds to rest or sublingual nitroglycerin.  However, he had an episode today at rest that only partially responded to nitroglycerin and continued and thus he came for evaluation.  The pain is substernal with radiation to left arm and shoulder.  No other associated symptoms.  EKG shows right bundle branch block with no ischemic changes.  Troponin is negative so far.  Past Medical History:  Diagnosis Date  . Asthma   . BPH (benign prostatic hyperplasia)   . Chronic kidney disease    Kidney Stones  . Coronary artery disease   . Diabetes mellitus without complication ([place])   .  Heart murmur   . Hyperlipidemia   . Hypertension   . Myocardial infarction [facility])     Past Surgical History:  Procedure Laterality Date  . COLONOSCOPY WITH PROPOFOL N/A 12/03/2014   Procedure: COLONOSCOPY WITH PROPOFOL;  Surgeon: Manya Silvas, MD;  [facility];  Service: Endoscopy;  Laterality: N/A;  . CORONARY ARTERY BYPASS GRAFT       Home Medications:  Prior to Admission medications   Medication Sig Start Date End Date Taking? Authorizing Provider  aspirin EC 81 MG tablet Take 81 mg by mouth at bedtime.    Yes [provider]  atorvastatin (LIPITOR) 80 MG tablet Take 80 mg by mouth at bedtime.   Yes [provider]  cholecalciferol (VITAMIN D3) 25 MCG (1000 UT) tablet Take 1,000 Units by mouth 2 (two) times daily.   Yes [provider]  furosemide (LASIX) 20 MG tablet Take 1 tablet (20 mg total) by mouth daily. do not take the Lasix every day take 1 pill every other day. Patient taking differently: Take 20 mg by mouth every other day.  06/29/17 06/29/18 Yes Nena Polio, MD  insulin glargine (LANTUS) 100 UNIT/ML injection Inject 24 Units into the skin at bedtime.    Yes [provider]  lisinopril (PRINIVIL,ZESTRIL) 5 MG tablet Take 5 mg by mouth at bedtime.    Yes [provider]  metFORMIN (GLUCOPHAGE) 1000 MG tablet Take 1,000 mg by mouth 2 (two) times daily with a meal.  Yes [provider]  metoprolol succinate (TOPROL-XL) 50 MG 24 hr tablet Take 50 mg by mouth at bedtime.   Yes [provider]  nitroGLYCERIN (NITROSTAT) 0.4 MG SL tablet Place 0.4 mg under the tongue every 5 (five) minutes as needed for chest pain.    Yes [provider]  sertraline (ZOLOFT) 100 MG tablet Take 150 mg by mouth at bedtime.   Yes [provider]  tamsulosin (FLOMAX) 0.4 MG CAPS capsule Take 0.4 mg by mouth at bedtime.    Yes [provider]    Inpatient Medications: Scheduled  Meds:  Continuous Infusions: . heparin 1,100 Units/hr (05/20/18 1551)  . nitroGLYCERIN 30 mcg/min (05/20/18 1707)   PRN Meds: nitroGLYCERIN  Allergies:    Allergies  Allergen Reactions  . Tape Rash    blisters    Social History:   Social History   Socioeconomic History  . Marital status: Married    Spouse name: Not on file  . Number of children: Not on file  . Years of education: Not on file  . Highest education level: Not on file  Occupational History  . Occupation: retired  Scientific laboratory technician  . Financial resource strain: Not on file  . Food insecurity:    Worry: Not on file    Inability: Not on file  . Transportation needs:    Medical: Not on file    Non-medical: Not on file  Tobacco Use  . Smoking status: Former Smoker    Last attempt to quit: 06/19/1984    Years since quitting: 33.9  . Smokeless tobacco: Never Used  Substance and Sexual Activity  . Alcohol use: No  . Drug use: No  . Sexual activity: Not on file  Lifestyle  . Physical activity:    Days per week: Not on file    Minutes per session: Not on file  . Stress: Not on file  Relationships  . Social connections:    Talks on phone: Not on file    Gets together: Not on file    Attends religious service: Not on file    Active member of club or organization: Not on file    Attends meetings of clubs or organizations: Not on file    Relationship status: Not on file  . Intimate partner violence:    Fear of current or ex partner: Not on file    Emotionally abused: Not on file    Physically abused: Not on file    Forced sexual activity: Not on file  Other Topics Concern  . Not on file  Social History Narrative  . Not on file    Family History:    Family History  Problem Relation Age of Onset  . Diabetes Mother   . Cancer Mother   . Hypertension Father   . Hypertension Brother   . Diabetes Brother      ROS:  Please see the history of present illness.   All other ROS reviewed and negative.      Physical Exam/Data:   Vitals:   05/20/18 1615 05/20/18 1630 05/20/18 1645 05/20/18 1700  BP: 121/76 110/74 109/72 93/72  Pulse: (!) 58 (!) 57 (!) 57 (!) 59  Resp: 19 18 20 18   Temp:      TempSrc:      SpO2: 97% 96% 99% 99%  Weight:      Height:       No intake or output data in the 24 hours ending 05/20/18 1716 Filed  Weights   05/20/18 1247  Weight: 89.8 kg   Body mass index is 31.96 kg/m.  General:  Well nourished, well developed, in no acute distress HEENT: normal Lymph: no adenopathy Neck: no JVD Endocrine:  No thryomegaly Vascular: No carotid bruits; FA pulses 2+ bilaterally without bruits  Cardiac:  normal S1, S2; RRR; no murmur  Lungs:  clear to auscultation bilaterally, no wheezing, rhonchi or rales  Abd: soft, nontender, no hepatomegaly  Ext: no edema Musculoskeletal:  No deformities, BUE and BLE strength normal and equal Skin: warm and dry  Neuro:  CNs 2-12 intact, no focal abnormalities noted Psych:  Normal affect   EKG:  The EKG was personally reviewed and demonstrates: Normal sinus rhythm with right bundle branch block. Telemetry:  Telemetry was personally reviewed and demonstrates:    Relevant CV Studies:   Laboratory Data:  Chemistry Recent Labs  Lab 05/20/18 1248  NA 135  K 4.8  CL 102  CO2 25  GLUCOSE 344*  BUN 33*  CREATININE 1.26*  CALCIUM 9.3  GFRNONAA 54*  GFRAA >60  ANIONGAP 8    Recent Labs  Lab 05/20/18 1248  PROT 6.9  ALBUMIN 4.3  AST 17  ALT 23  ALKPHOS 66  BILITOT 0.9   Hematology Recent Labs  Lab 05/20/18 1248  WBC 6.8  RBC 4.61  HGB 13.0  HCT 38.3*  MCV 83.1  MCH 28.2  MCHC 33.9  RDW 14.3  PLT 105*   Cardiac Enzymes Recent Labs  Lab 05/20/18 1248  TROPONINI <0.03   No results for input(s): TROPIPOC in the last 168 hours.  BNPNo results for input(s): BNP, PROBNP in the last 168 hours.  DDimer No results for input(s): DDIMER in the last 168 hours.  Radiology/Studies:  Dg Chest Portable 1  View  Result Date: 05/20/2018 CLINICAL DATA:  Left-sided chest pain beginning this morning. EXAM: PORTABLE CHEST 1 VIEW COMPARISON:  06/29/2017, 01/30/2017 and CT 05/21/2014 FINDINGS: Sternotomy wires unchanged. Lungs are adequately inflated and demonstrate patchy bilateral nodular opacification and calcification improved from the previous exams and compatible with known sarcoidosis. No acute changes. Cardiomediastinal silhouette and remainder the exam is unchanged. IMPRESSION: No acute findings. Patchy bilateral nodular opacification and calcification improved and compatible with known sarcoidosis Electronically Signed   By: Marin Olp M.D.   On: 05/20/2018 13:13    Assessment and Plan:   1. Unstable angina: Patient has known history of coronary artery disease with previous CABG.  He has been evaluated recently at [facility] for increased anginal symptoms with abnormal nuclear stress test which showed anterior wall ischemia.  The patient has already been scheduled for left heart catheterization in few weeks.  However, he now presents with chest pain at rest only partially responsive to nitroglycerin highly suggestive of unstable angina.  I agree with unfractionated heparin and nitroglycerin drip.  Due to his continued symptoms, I recommend proceeding with left heart catheterization possible PCI tomorrow to be done by Dr. Saunders Revel.  I discussed the procedure in details as well as risks and benefits.  I especially discussed with him the risk of contrast-induced nephropathy given underlying chronic kidney disease.  I will plan to start hydration tonight.  I requested an echocardiogram.  Further recommendations to follow after cardiac cath. 2. Essential hypertension: Blood pressure is reasonably controlled. 3. Hyperlipidemia: Continue atorvastatin. 4. Chronic kidney disease: Creatinine today appears to be close to baseline. 5. Thrombocytopenia: It appears that this was first noted in August 2019.  The count is  still  above 100,000.  Nonetheless, consider hematology evaluation as an outpatient if not already done.      For questions or updates, please contact [place] Please consult [email or web address] for contact info under     Signed, Kathlyn Sacramento, MD  05/20/2018 5:16 PM

## 2018-05-20 NOTE — Consult Note (Signed)
ANTICOAGULATION CONSULT NOTE - Initial Consult  Pharmacy Consult for Heparin Indication: chest pain/ACS  Allergies  Allergen Reactions  . Tape Rash    blisters    Patient Measurements: Height: 5\' 6"  (167.6 cm) Weight: 198 lb (89.8 kg) IBW/kg (Calculated) : 63.8 Heparin Dosing Weight: 82.8  Vital Signs: Temp: 97.9 F (36.6 C) (12/02 1246) Temp Source: Oral (12/02 1246) BP: 127/84 (12/02 1506) Pulse Rate: 59 (12/02 1506)  Labs: Recent Labs    05/20/18 1248  HGB 13.0  HCT 38.3*  PLT 105*  LABPROT 13.8  INR 1.07  CREATININE 1.26*  TROPONINI <0.03    Estimated Creatinine Clearance: 50.7 mL/min (A) (by C-G formula based on SCr of 1.26 mg/dL (H)).   Medical History: Past Medical History:  Diagnosis Date  . Asthma   . BPH (benign prostatic hyperplasia)   . Chronic kidney disease    Kidney Stones  . Coronary artery disease   . Diabetes mellitus without complication ([place])   . Heart murmur   . Hyperlipidemia   . Hypertension   . Myocardial infarction [facility])     Medications:  Metoprolol, ASA 81mg  (no pta anti-coagulant)  Assessment: Pharmacy has been consulted for heparin dosing for unstable angina (ACS/STEMI)   Goal of Therapy:  Heparin level 0.3-0.7 units/ml Monitor platelets by anticoagulation protocol: Yes   Plan:  Give 4000 units bolus x 1, followed by 1100 units/hour - will follow HL @ 2330 12/2 Continue to follow CBC's daily (current platelet count is 105)   Lu Duffel, PharmD Clinical Pharmacist 05/20/2018 3:26 PM

## 2018-05-20 NOTE — H&P (Signed)
[place] [facility] [place] NAME: Peter Robertson    MR#:  [id number]  DATE OF BIRTH:  Jun 26, 1940  DATE OF ADMISSION:  05/20/2018  PRIMARY CARE PHYSICIAN: System, Pcp Not In   REQUESTING/REFERRING PHYSICIAN: Dr. Janeece Fitting  CHIEF COMPLAINT:   Chief Complaint  Patient presents with  . Chest Pain   Chest pain since this morning. HISTORY OF PRESENT ILLNESS:  [name]  is a 77 y.o. male with a known history of asthma, BPH, CKD, CAD, hypertension, diabetes, hyperlipidemia and MI.  The patient presents the ED with above chief complaints.  He started to have a chest pain today, which is in substernal area, sharp, 8 out of 10, constant with radiation to left arm and shoulder.  He took nitroglycerin without improvement.  He denies any nausea or diaphoresis or palpitation or shortness of breath.  He has a history of chest pain but not like this.  The patient said he is planned to get some cardiac surgery in [facility] this week.  The first troponin level is normal. EKG: Sinus rhythm rate of 70.  Right axis, right bundle branch block.  Normal ST segments and T waves.  No acute ischemic changes.  Dr. Joni Fears start heparin drip for unstable angina. PAST MEDICAL HISTORY:   Past Medical History:  Diagnosis Date  . Asthma   . BPH (benign prostatic hyperplasia)   . Chronic kidney disease    Kidney Stones  . Coronary artery disease   . Diabetes mellitus without complication ([place])   . Heart murmur   . Hyperlipidemia   . Hypertension   . Myocardial infarction [facility])     PAST SURGICAL HISTORY:   Past Surgical History:  Procedure Laterality Date  . COLONOSCOPY WITH PROPOFOL N/A 12/03/2014   Procedure: COLONOSCOPY WITH PROPOFOL;  Surgeon: Manya Silvas, MD;  Location: [facility] ENDOSCOPY;  Service: Endoscopy;  Laterality: N/A;  . CORONARY ARTERY BYPASS GRAFT      SOCIAL HISTORY:   Social History   Tobacco Use  . Smoking status: Former Smoker    Last attempt to quit: 06/19/1984    Years since quitting: 33.9  . Smokeless tobacco: Never Used  Substance Use Topics  . Alcohol use: No    FAMILY HISTORY:   Family History  Problem Relation Age of Onset  . Diabetes Mother   . Cancer Mother   . Hypertension Father   . Hypertension Brother   . Diabetes Brother      DRUG ALLERGIES:   Allergies  Allergen Reactions  . Tape Rash    blisters    REVIEW OF SYSTEMS:   Review of Systems  Constitutional: Negative for chills, fever and malaise/fatigue.  HENT: Negative for sore throat.   Eyes: Negative for blurred vision and double vision.  Respiratory: Negative for cough, hemoptysis, shortness of breath, wheezing and stridor.   Cardiovascular: Positive for chest pain. Negative for palpitations, orthopnea and leg swelling.  Gastrointestinal: Negative for abdominal pain, blood in stool, diarrhea, melena, nausea and vomiting.  Genitourinary: Negative for dysuria, flank pain and hematuria.  Musculoskeletal: Negative for back pain and joint pain.  Skin: Negative for rash.  Neurological: Negative for dizziness, sensory change, focal weakness, seizures, loss of consciousness, weakness and headaches.  Endo/Heme/Allergies: Negative for polydipsia.  Psychiatric/Behavioral: Negative for depression. The patient is not nervous/anxious.     MEDICATIONS AT HOME:   Prior to Admission medications   Medication Sig Start Date End Date Taking?  Authorizing Provider  aspirin EC 81 MG tablet Take 81 mg by mouth at bedtime.    Yes [provider]  atorvastatin (LIPITOR) 80 MG tablet Take 80 mg by mouth at bedtime.   Yes [provider]  cholecalciferol (VITAMIN D3) 25 MCG (1000 UT) tablet Take 1,000 Units by mouth 2 (two) times daily.   Yes [provider]  furosemide (LASIX) 20 MG tablet Take 1 tablet (20 mg total) by mouth daily. do not take the Lasix every day take 1 pill every other day. Patient taking differently: Take  20 mg by mouth every other day.  06/29/17 06/29/18 Yes Nena Polio, MD  insulin glargine (LANTUS) 100 UNIT/ML injection Inject 24 Units into the skin at bedtime.    Yes [provider]  lisinopril (PRINIVIL,ZESTRIL) 5 MG tablet Take 5 mg by mouth at bedtime.    Yes [provider]  metFORMIN (GLUCOPHAGE) 1000 MG tablet Take 1,000 mg by mouth 2 (two) times daily with a meal.   Yes [provider]  metoprolol succinate (TOPROL-XL) 50 MG 24 hr tablet Take 50 mg by mouth at bedtime.   Yes [provider]  nitroGLYCERIN (NITROSTAT) 0.4 MG SL tablet Place 0.4 mg under the tongue every 5 (five) minutes as needed for chest pain.    Yes [provider]  sertraline (ZOLOFT) 100 MG tablet Take 150 mg by mouth at bedtime.   Yes [provider]  tamsulosin (FLOMAX) 0.4 MG CAPS capsule Take 0.4 mg by mouth at bedtime.    Yes [provider]      VITAL SIGNS:  Blood pressure 108/76, pulse (!) 57, temperature 97.9 F (36.6 C), temperature source Oral, resp. rate 18, height 5\' 6"  (1.676 m), weight 89.8 kg, SpO2 97 %.  PHYSICAL EXAMINATION:  Physical Exam  GENERAL:  77 y.o.-year-old patient lying in the bed with no acute distress.  EYES: Pupils equal, round, reactive to light and accommodation. No scleral icterus. Extraocular muscles intact.  HEENT: Head atraumatic, normocephalic. Oropharynx and nasopharynx clear.  NECK:  Supple, no jugular venous distention. No thyroid enlargement, no tenderness.  LUNGS: Normal breath sounds bilaterally, no wheezing, rales,rhonchi or crepitation. No use of accessory muscles of respiration.  CARDIOVASCULAR: S1, S2 normal. No murmurs, rubs, or gallops.  ABDOMEN: Soft, nontender, nondistended. Bowel sounds present. No organomegaly or mass.  EXTREMITIES: No pedal edema, cyanosis, or clubbing.  NEUROLOGIC: Cranial nerves II through XII are intact. Muscle strength 5/5 in all extremities. Sensation intact. Gait not  checked.  PSYCHIATRIC: The patient is alert and oriented x 3.  SKIN: No obvious rash, lesion, or ulcer.   LABORATORY PANEL:   CBC Recent Labs  Lab 05/20/18 1248  WBC 6.8  HGB 13.0  HCT 38.3*  PLT 105*   ------------------------------------------------------------------------------------------------------------------  Chemistries  Recent Labs  Lab 05/20/18 1248  NA 135  K 4.8  CL 102  CO2 25  GLUCOSE 344*  BUN 33*  CREATININE 1.26*  CALCIUM 9.3  AST 17  ALT 23  ALKPHOS 66  BILITOT 0.9   ------------------------------------------------------------------------------------------------------------------  Cardiac Enzymes Recent Labs  Lab 05/20/18 1248  TROPONINI <0.03   ------------------------------------------------------------------------------------------------------------------  RADIOLOGY:  Dg Chest Portable 1 View  Result Date: 05/20/2018 CLINICAL DATA:  Left-sided chest pain beginning this morning. EXAM: PORTABLE CHEST 1 VIEW COMPARISON:  06/29/2017, 01/30/2017 and CT 05/21/2014 FINDINGS: Sternotomy wires unchanged. Lungs are adequately inflated and demonstrate patchy bilateral nodular opacification and calcification improved from the previous exams and compatible with known  sarcoidosis. No acute changes. Cardiomediastinal silhouette and remainder the exam is unchanged. IMPRESSION: No acute findings. Patchy bilateral nodular opacification and calcification improved and compatible with known sarcoidosis Electronically Signed   By: Marin Olp M.D.   On: 05/20/2018 13:13      IMPRESSION AND PLAN:   Unstable angina with history of CAD, s/p CABG. The patient will be admitted to telemetry floor. Continue heparin drip, nitroglycerin drip, aspirin, Plavix and Lipitor.  Follow-up troponin level and cardiology consult.  Hypertension.  Continue home hypertension medication. Diabetes.  Continue Lantus and start sliding scale. CKD stage III.  Stable.  I discussed  with Dr. Fletcher Anon, possible cardiac cath tomorrow. All the records are reviewed and case discussed with ED provider. Management plans discussed with the patient, family and they are in agreement.  CODE STATUS: Full code.  TOTAL TIME TAKING CARE OF THIS PATIENT: 37 minutes.    Demetrios Loll M.D on 05/20/2018 at 4:17 PM  Between 7am to 6pm - Pager - [phone]  After 6pm go to [email or web address] - Proofreader  [facility]  Office  [phone]  CC: Primary care physician; System, Pcp Not In   Note: This dictation was prepared with Dragon dictation along with smaller phrase technology. Any transcriptional errors that result from this process are unin

## 2018-05-20 NOTE — ED Notes (Signed)
Patient denies pain and is resting comfortably.  

## 2018-05-21 ENCOUNTER — Inpatient Hospital Stay: Admit: 2018-05-21 | Payer: Medicare Other

## 2018-05-21 ENCOUNTER — Inpatient Hospital Stay (HOSPITAL_COMMUNITY)
Admit: 2018-05-21 | Discharge: 2018-05-21 | Disposition: A | Discharge status: Home / Self Care | Payer: Medicare Other | Attending: Cardiovascular Disease | Admitting: Cardiovascular Disease

## 2018-05-21 ENCOUNTER — Encounter
Admission: EM | Disposition: A | Discharge status: Home Health | Payer: Self-pay | Source: Home / Self Care | Attending: Internal Medicine

## 2018-05-21 DIAGNOSIS — R079 Chest pain, unspecified: Secondary | ICD-10-CM

## 2018-05-21 LAB — GLUCOSE, CAPILLARY
Glucose-Capillary: 169 mg/dL — ABNORMAL HIGH (ref 70–99)
Glucose-Capillary: 143 mg/dL — ABNORMAL HIGH (ref 70–99)
Glucose-Capillary: 158 mg/dL — ABNORMAL HIGH (ref 70–99)
Glucose-Capillary: 130 mg/dL — ABNORMAL HIGH (ref 70–99)

## 2018-05-21 LAB — ECHOCARDIOGRAM COMPLETE
Height: 66 in
Weight: 3044.11 oz

## 2018-05-21 LAB — CBC
HCT: 33.7 % — ABNORMAL LOW (ref 39.0–52.0)
Hemoglobin: 11.5 g/dL — ABNORMAL LOW (ref 13.0–17.0)
MCH: 28.2 pg (ref 26.0–34.0)
MCHC: 34.1 g/dL (ref 30.0–36.0)
MCV: 82.6 fL (ref 80.0–100.0)
Platelets: 111 10*3/uL — ABNORMAL LOW (ref 150–400)
RBC: 4.08 MIL/uL — ABNORMAL LOW (ref 4.22–5.81)
RDW: 14.3 % (ref 11.5–15.5)
WBC: 7.9 10*3/uL (ref 4.0–10.5)
nRBC: 0 % (ref 0.0–0.2)

## 2018-05-21 LAB — HEPARIN LEVEL (UNFRACTIONATED)
Heparin Unfractionated: 0.2 IU/mL — ABNORMAL LOW (ref 0.30–0.70)
Heparin Unfractionated: 0.28 IU/mL — ABNORMAL LOW (ref 0.30–0.70)
Heparin Unfractionated: 0.35 IU/mL (ref 0.30–0.70)

## 2018-05-21 LAB — TROPONIN I: Troponin I: <0.03 ng/mL (ref <0.03)

## 2018-05-21 SURGERY — LEFT HEART CATH AND CORONARY ANGIOGRAPHY
Anesthesia: Moderate Sedation

## 2018-05-21 MED ORDER — SODIUM CHLORIDE 0.9 % IV SOLN
INTRAVENOUS | Status: DC
  Administered 2018-05-22: 09:00:00 via INTRAVENOUS

## 2018-05-21 MED ORDER — SODIUM CHLORIDE 0.9% FLUSH
3.0000 mL | Freq: Two times a day (BID) | INTRAVENOUS | Status: DC
  Administered 2018-05-21 – 2018-05-22 (×2): 3 mL via INTRAVENOUS

## 2018-05-21 MED ORDER — NITROGLYCERIN IN D5W 200-5 MCG/ML-% IV SOLN
5.0000 ug/min | INTRAVENOUS | Status: DC
  Filled 2018-05-21: qty 250

## 2018-05-21 MED ORDER — ASPIRIN 81 MG PO CHEW
81.0000 mg | CHEWABLE_TABLET | ORAL | Status: AC
  Administered 2018-05-22: 81 mg via ORAL
  Filled 2018-05-21: qty 1

## 2018-05-21 MED ORDER — HEPARIN BOLUS VIA INFUSION
1250.0000 [IU] | Freq: Once | INTRAVENOUS | Status: AC
  Administered 2018-05-21: 1250 [IU] via INTRAVENOUS
  Filled 2018-05-21: qty 1250

## 2018-05-21 MED ORDER — HEPARIN BOLUS VIA INFUSION
1000.0000 [IU] | Freq: Once | INTRAVENOUS | Status: AC
  Administered 2018-05-21: 1000 [IU] via INTRAVENOUS
  Filled 2018-05-21: qty 1000

## 2018-05-21 MED ORDER — SODIUM CHLORIDE 0.9 % WEIGHT BASED INFUSION: 1.0000 mL/kg/h | INTRAVENOUS | Status: DC

## 2018-05-21 MED ORDER — SODIUM CHLORIDE 0.9% FLUSH: 3.0000 mL | INTRAVENOUS | Status: DC | PRN

## 2018-05-21 MED ORDER — PERFLUTREN LIPID MICROSPHERE
1.0000 mL | INTRAVENOUS | Status: AC | PRN
  Administered 2018-05-21: 2 mL via INTRAVENOUS
  Filled 2018-05-21: qty 10

## 2018-05-21 MED ORDER — SODIUM CHLORIDE 0.9 % IV SOLN: 250.0000 mL | INTRAVENOUS | Status: DC | PRN

## 2018-05-21 NOTE — Consult Note (Signed)
ANTICOAGULATION CONSULT NOTE - Initial Consult  Pharmacy Consult for Heparin Indication: chest pain/ACS  Allergies  Allergen Reactions  . Tape Rash    blisters    Patient Measurements: Height: 5\' 6"  (167.6 cm) Weight: 190 lb 4.1 oz (86.3 kg) IBW/kg (Calculated) : 63.8 Heparin Dosing Weight: 82.8  Vital Signs: Temp: 97.9 F (36.6 C) (12/02 2348) Temp Source: Oral (12/02 2348) BP: 113/94 (12/02 2348) Pulse Rate: 67 (12/02 2348)  Labs: Recent Labs    05/20/18 1248 05/20/18 2057 05/20/18 2356  HGB 13.0  --   --   HCT 38.3*  --   --   PLT 105*  --   --   APTT 31  --   --   LABPROT 13.8  --   --   INR 1.07  --   --   HEPARINUNFRC  --   --  0.20*  CREATININE 1.26*  --   --   TROPONINI <0.03 <0.03  --     Estimated Creatinine Clearance: 49.8 mL/min (A) (by C-G formula based on SCr of 1.26 mg/dL (H)).   Medical History: Past Medical History:  Diagnosis Date  . Asthma   . BPH (benign prostatic hyperplasia)   . Chronic kidney disease    Kidney Stones  . Coronary artery disease   . Diabetes mellitus without complication ([place])   . Heart murmur   . Hyperlipidemia   . Hypertension   . Myocardial infarction [facility])     Medications:  Metoprolol, ASA 81mg  (no pta anti-coagulant)  Assessment: Pharmacy has been consulted for heparin dosing for unstable angina (ACS/STEMI)   Goal of Therapy:  Heparin level 0.3-0.7 units/ml Monitor platelets by anticoagulation protocol: Yes   Plan:  12/2 @ 2300 HL 0.20 subtherapeutic. Will rebolus w/ heparin 1250 units IV x 1 and increase rate to 1200 units/hr and will recheck HL @ 0700. Will monitor CBC w/ am labs.  Tobie Lords, PharmD Clinical Pharmacist 05/21/2018 12:53 AM

## 2018-05-21 NOTE — Consult Note (Signed)
ANTICOAGULATION CONSULT NOTE - Initial Consult  Pharmacy Consult for Heparin Indication: chest pain/ACS  Allergies  Allergen Reactions  . Tape Rash    blisters    Patient Measurements: Height: 5\' 6"  (167.6 cm) Weight: 190 lb 4.1 oz (86.3 kg) IBW/kg (Calculated) : 63.8 Heparin Dosing Weight: 82.8  Vital Signs: Temp: 97.5 F (36.4 C) (12/03 0910) Temp Source: Oral (12/03 0910) BP: 114/63 (12/03 0910) Pulse Rate: 63 (12/03 0910)  Labs: Recent Labs    05/20/18 1248 05/20/18 2057 05/20/18 2356 05/21/18 0235 05/21/18 0711 05/21/18 1501  HGB 13.0  --   --  11.5*  --   --   HCT 38.3*  --   --  33.7*  --   --   PLT 105*  --   --  111*  --   --   APTT 31  --   --   --   --   --   LABPROT 13.8  --   --   --   --   --   INR 1.07  --   --   --   --   --   HEPARINUNFRC  --   --  0.20*  --  0.35 0.28*  CREATININE 1.26*  --   --   --   --   --   TROPONINI <0.03 <0.03  --  <0.03  --   --     Estimated Creatinine Clearance: 49.8 mL/min (A) (by C-G formula based on SCr of 1.26 mg/dL (H)).   Medical History: Past Medical History:  Diagnosis Date  . Asthma   . BPH (benign prostatic hyperplasia)   . Chronic kidney disease    Kidney Stones  . Coronary artery disease   . Diabetes mellitus without complication ([place])   . Heart murmur   . Hyperlipidemia   . Hypertension   . Myocardial infarction [facility])     Assessment: 77 year old male admitted with chest pain. No anticoagulation PTA. Pharmacy has been consulted for heparin dosing for unstable angina (ACS/STEMI).  Plan for cath tomorrow.  Goal of Therapy:  Heparin level 0.3-0.7 units/ml Monitor platelets by anticoagulation protocol: Yes   Plan:  12/3 1500 HL 0.28 subtherapeutic. Will give 1000 unit bolus and increase drip to 1300 units/hr. Heparin level ordered for 12/4 at 0000. CBC with morning labs.  Pharmacy to continue to follow patient.  Tawnya Crook, PharmD Pharmacy Resident  05/21/2018 3:34 PM

## 2018-05-21 NOTE — Progress Notes (Signed)
*  PRELIMINARY RESULTS* Echocardiogram 2D Echocardiogram has been performed. Definity image enhancer was adminstered.  Wallie Char Jaima Janney 05/21/2018, 3:06 PM

## 2018-05-21 NOTE — Consult Note (Signed)
ANTICOAGULATION CONSULT NOTE - Initial Consult  Pharmacy Consult for Heparin Indication: chest pain/ACS  Allergies  Allergen Reactions  . Tape Rash    blisters    Patient Measurements: Height: 5\' 6"  (167.6 cm) Weight: 190 lb 4.1 oz (86.3 kg) IBW/kg (Calculated) : 63.8 Heparin Dosing Weight: 82.8  Vital Signs: Temp: 97.9 F (36.6 C) (12/02 2348) Temp Source: Oral (12/02 2348) BP: 117/59 (12/03 0217) Pulse Rate: 63 (12/03 0217)  Labs: Recent Labs    05/20/18 1248 05/20/18 2057 05/20/18 2356 05/21/18 0235 05/21/18 0711  HGB 13.0  --   --  11.5*  --   HCT 38.3*  --   --  33.7*  --   PLT 105*  --   --  111*  --   APTT 31  --   --   --   --   LABPROT 13.8  --   --   --   --   INR 1.07  --   --   --   --   HEPARINUNFRC  --   --  0.20*  --  0.35  CREATININE 1.26*  --   --   --   --   TROPONINI <0.03 <0.03  --  <0.03  --     Estimated Creatinine Clearance: 49.8 mL/min (A) (by C-G formula based on SCr of 1.26 mg/dL (H)).   Medical History: Past Medical History:  Diagnosis Date  . Asthma   . BPH (benign prostatic hyperplasia)   . Chronic kidney disease    Kidney Stones  . Coronary artery disease   . Diabetes mellitus without complication ([place])   . Heart murmur   . Hyperlipidemia   . Hypertension   . Myocardial infarction [facility])     Assessment: 77 year old male admitted with chest pain. No anticoagulation PTA. Pharmacy has been consulted for heparin dosing for unstable angina (ACS/STEMI).  Plan for patient to transfer to [facility] when bed available.  Goal of Therapy:  Heparin level 0.3-0.7 units/ml Monitor platelets by anticoagulation protocol: Yes   Plan:  12/3 0700 HL 0.35 therapeutic. Will continue heparin drip at 1200 units/hr. Heparin level ordered for 1500 to confirm. CBC with morning labs.  Pharmacy to continue to follow patient.  Tawnya Crook, PharmD Pharmacy Resident  05/21/2018 8:59 AM

## 2018-05-21 NOTE — Progress Notes (Addendum)
Pt to be transported to [facility] when transportation becomes available.

## 2018-05-21 NOTE — Progress Notes (Signed)
Progress Note  Patient Name: Peter Robertson Date of Encounter: 05/21/2018  Primary Cardiologist: [facility]  Subjective   No chest pain. Ruled out. Echo pending. Overnight, the patient and his family requested transfer to [facility]. We are currently awaiting bed assignment at [facility] at this time. Patient now states he is willing to have the cath done at [facility], though there is no family here at this time to discuss this further.   Inpatient Medications    Scheduled Meds: . aspirin  324 mg Oral NOW   Or  . aspirin  300 mg Rectal NOW  . aspirin EC  81 mg Oral Daily  . atorvastatin  80 mg Oral QHS  . cholecalciferol  1,000 Units Oral BID  . clopidogrel  75 mg Oral Q breakfast  . insulin aspart  0-5 Units Subcutaneous QHS  . insulin aspart  0-9 Units Subcutaneous TID WC  . insulin glargine  24 Units Subcutaneous QHS  . lisinopril  5 mg Oral QHS  . metoprolol succinate  50 mg Oral QHS  . sertraline  150 mg Oral QHS  . tamsulosin  0.4 mg Oral QHS   Continuous Infusions: . sodium chloride 75 mL/hr at 05/21/18 0047  . heparin 1,200 Units/hr (05/21/18 1002)  . nitroGLYCERIN 5 mcg/min (05/21/18 0607)   PRN Meds: acetaminophen, ALPRAZolam, nitroGLYCERIN, nitroGLYCERIN, ondansetron (ZOFRAN) IV, zolpidem   Vital Signs    Vitals:   05/20/18 2348 05/20/18 2353 05/21/18 0217 05/21/18 0910  BP: (!) 113/94  (!) 117/59 114/63  Pulse: 67  63 63  Resp: 19     Temp: 97.9 F (36.6 C)   (!) 97.5 F (36.4 C)  TempSrc: Oral   Oral  SpO2: 97%   96%  Weight:  86.3 kg    Height:        Intake/Output Summary (Last 24 hours) at 05/21/2018 1124 Last data filed at 05/21/2018 0700 Gross per 24 hour  Intake 684.04 ml  Output 300 ml  Net 384.04 ml   Filed Weights   05/20/18 1247 05/20/18 2353  Weight: 89.8 kg 86.3 kg    Telemetry    NSR - Personally Reviewed  ECG    n/a - Personally Reviewed  Physical Exam   GEN: No acute distress.   Neck: No JVD. Cardiac: RRR, no murmurs, rubs, or gallops.    Respiratory: Clear to auscultation bilaterally.  GI: Soft, nontender, non-distended.   MS: No edema; No deformity. Neuro:  Alert and oriented x 3; Nonfocal.  Psych: Normal affect.  Labs    Chemistry Recent Labs  Lab 05/20/18 1248  NA 135  K 4.8  CL 102  CO2 25  GLUCOSE 344*  BUN 33*  CREATININE 1.26*  CALCIUM 9.3  PROT 6.9  ALBUMIN 4.3  AST 17  ALT 23  ALKPHOS 66  BILITOT 0.9  GFRNONAA 54*  GFRAA >60  ANIONGAP 8     Hematology Recent Labs  Lab 05/20/18 1248 05/21/18 0235  WBC 6.8 7.9  RBC 4.61 4.08*  HGB 13.0 11.5*  HCT 38.3* 33.7*  MCV 83.1 82.6  MCH 28.2 28.2  MCHC 33.9 34.1  RDW 14.3 14.3  PLT 105* 111*    Cardiac Enzymes Recent Labs  Lab 05/20/18 1248 05/20/18 2057 05/21/18 0235  TROPONINI <0.03 <0.03 <0.03   No results for input(s): TROPIPOC in the last 168 hours.   BNPNo results for input(s): BNP, PROBNP in the last 168 hours.   DDimer No results for input(s): DDIMER in  the last 168 hours.   Radiology    Dg Chest Portable 1 View  Result Date: 05/20/2018 IMPRESSION: No acute findings. Patchy bilateral nodular opacification and calcification improved and compatible with known sarcoidosis Electronically Signed   By: Marin Olp M.D.   On: 05/20/2018 13:13    Cardiac Studies   [facility]HC 01/2017: Findings: 1. Coronary artery disease including a distal left main 50% stenosis,  proximal ramus 80% stenosis, and occluded ostial LAD. 2. Prior coronary bypass surgery with patent LIMA and occluded SVG to OM.  3. Decreased left ventricular filling pressures (LVEDP = 5 mm Hg). 4. Normal left ventricular systolic function (estimated LVEF = 55 %).  Recommendations: 1. Medical management. 2. Follow up with primary cardiologist.  Complications:  None  Patient Profile     77 y.o. male with history of CAD s/p 2-vessel CABG in 2011 with LIMA to LAD and SVG to )M with cath in 01/2017 showing 50% distal LM stenosis, 80% proximal ramus stenosis,  occluded LAD with a patent LIMA to LAD and an occluded SVG to OM1. He also has history of DM2, CKD, HTN, and COPD. We are asked to evaluate the patient for unstable angina.   Assessment & Plan    1. CAD s/p CABG with unstable angina: -Ruled out -Chest pain free -Patient and his family initially requested transfer to [facility] overnight heading into 12/3. He now states he is ok with having his cath at [facility], however, there is no family present for further discussion -Cath board is currently full -Dr. Saunders Revel will come by and talk with the patient further regarding cath today vs 12/4 vs continued pursuit of transfer to [facility] -Continue heparin gtt, ASA, Plavix, Lipitor, Toprol XL -Taper off nitro gtt -Can add Imdur if needed -Currently NPO, if cath is deferred to [facility] or 12/4, he will need a diet order placed   2. HTN: -Well controlled -Monitor with tapering of nitro gtt  3. HLD: -Recent LDL of 27 from 01/2018 -Lipitor  4. CKD stage II: -Monitor post cath -Appears close to baseline  5. Thrombocytopenia: -Stable  For questions or updates, please contact [place] Please consult [email or web address] for contact info under Cardiology/STEMI.    Signed, Christell Faith, PA-C [place] Pager: [phone] 05/21/2018, 11:24 AM

## 2018-05-21 NOTE — Progress Notes (Signed)
[place] [facility] [place] NAME: Peter Robertson    MR#:  [id number]  DATE OF BIRTH:  [birth year]  SUBJECTIVE:  CHIEF COMPLAINT:   Chief Complaint  Patient presents with  . Chest Pain  still debating on if he would consider cath here or still waiting for [facility] transfer (I recommend he gets it done here and is considering to stay here now) REVIEW OF SYSTEMS:  Review of Systems  Constitutional: Negative for chills, fever and weight loss.  HENT: Negative for nosebleeds and sore throat.   Eyes: Negative for blurred vision.  Respiratory: Negative for cough, shortness of breath and wheezing.   Cardiovascular: Positive for chest pain. Negative for orthopnea, leg swelling and PND.  Gastrointestinal: Negative for abdominal pain, constipation, diarrhea, heartburn, nausea and vomiting.  Genitourinary: Negative for dysuria and urgency.  Musculoskeletal: Negative for back pain.  Skin: Negative for rash.  Neurological: Negative for dizziness, speech change, focal weakness and headaches.  Endo/Heme/Allergies: Does not bruise/bleed easily.  Psychiatric/Behavioral: Negative for depression.   DRUG ALLERGIES:   Allergies  Allergen Reactions  . Tape Rash    blisters   VITALS:  Blood pressure 114/63, pulse 63, temperature (!) 97.5 F (36.4 C), temperature source Oral, resp. rate 19, height 5\' 6"  (1.676 m), weight 86.3 kg, SpO2 96 %. PHYSICAL EXAMINATION:  Physical Exam  Constitutional: He is oriented to person, place, and time.  HENT:  Head: Normocephalic and atraumatic.  Eyes: Pupils are equal, round, and reactive to light. Conjunctivae and EOM are normal.  Neck: Normal range of motion. Neck supple. No tracheal deviation present. No thyromegaly present.  Cardiovascular: Normal rate, regular rhythm and normal heart sounds.  Pulmonary/Chest: Effort normal and breath sounds normal. No respiratory distress. He has no wheezes. He exhibits no tenderness.    Abdominal: Soft. Bowel sounds are normal. He exhibits no distension. There is no tenderness.  Musculoskeletal: Normal range of motion.  Neurological: He is alert and oriented to person, place, and time. No cranial nerve deficit.  Skin: Skin is warm and dry. No rash noted.   LABORATORY PANEL:  Male CBC Recent Labs  Lab 05/21/18 0235  WBC 7.9  HGB 11.5*  HCT 33.7*  PLT 111*   ------------------------------------------------------------------------------------------------------------------ Chemistries  Recent Labs  Lab 05/20/18 1248  NA 135  K 4.8  CL 102  CO2 25  GLUCOSE 344*  BUN 33*  CREATININE 1.26*  CALCIUM 9.3  AST 17  ALT 23  ALKPHOS 66  BILITOT 0.9   RADIOLOGY:  No results found. ASSESSMENT AND PLAN:  77 y.o. male with history of CAD s/p 2-vessel CABG in 2011 with LIMA to LAD and SVG to )M with cath in 01/2017 showing 50% distal LM stenosis, 80% proximal ramus stenosis, occluded LAD with a patent LIMA to LAD and an occluded SVG to OM1. He also has history of DM2, CKD, HTN, and COPD  * Unstable angina with history of CAD, s/p CABG. - Now after waiting for decision for cath here vs [facility] - he's lost his scheduled slot and will be done in morning tomorrow if he desires -Continue heparin gtt, ASA, Plavix, Lipitor, Toprol XL -Taper off nitro gtt  * HTN: -Well controlled  * DM - continue Lantus and SSI  * CKD stage II: - at baseline      All the records are reviewed and case discussed with Care Management/Social Worker. Management plans discussed with the patient, Thurmond Butts Personnel officer) and they are in  agreement.  CODE STATUS: Full Code  TOTAL TIME TAKING CARE OF THIS PATIENT: 35 minutes.   More than 50% of the time was spent in counseling/coordination of care: YES  POSSIBLE D/C IN 1-2 DAYS, DEPENDING ON CLINICAL CONDITION.   Max Sane M.D on 05/21/2018 at 1:23 PM  Between 7am to 6pm - Pager - [phone]  After 6pm go to [email or web address] - Solic[employer]  [facility]  Office  [phone]  CC: Primary care physician; System, Pcp Not In  Note: This dictation was prepared with Dragon dictation along with smaller phrase technology. Any transcriptional errors that result from this process are unintentional.

## 2018-05-22 ENCOUNTER — Encounter: Payer: Self-pay | Admitting: Cardiovascular Disease

## 2018-05-22 ENCOUNTER — Encounter
Admission: EM | Disposition: A | Discharge status: Home Health | Payer: Self-pay | Source: Home / Self Care | Attending: Internal Medicine

## 2018-05-22 DIAGNOSIS — I25810 Atherosclerosis of coronary artery bypass graft(s) without angina pectoris: Secondary | ICD-10-CM

## 2018-05-22 HISTORY — PX: LEFT HEART CATH AND CORS/GRAFTS ANGIOGRAPHY: CATH118250

## 2018-05-22 LAB — GLUCOSE, CAPILLARY
Glucose-Capillary: 148 mg/dL — ABNORMAL HIGH (ref 70–99)
Glucose-Capillary: 142 mg/dL — ABNORMAL HIGH (ref 70–99)
Glucose-Capillary: 164 mg/dL — ABNORMAL HIGH (ref 70–99)

## 2018-05-22 LAB — CBC
HCT: 34.8 % — ABNORMAL LOW (ref 39.0–52.0)
Hemoglobin: 11.7 g/dL — ABNORMAL LOW (ref 13.0–17.0)
MCH: 27.9 pg (ref 26.0–34.0)
MCHC: 33.6 g/dL (ref 30.0–36.0)
MCV: 83.1 fL (ref 80.0–100.0)
Platelets: 96 10*3/uL — ABNORMAL LOW (ref 150–400)
RBC: 4.19 MIL/uL — ABNORMAL LOW (ref 4.22–5.81)
RDW: 14 % (ref 11.5–15.5)
WBC: 6.1 10*3/uL (ref 4.0–10.5)
nRBC: 0 % (ref 0.0–0.2)

## 2018-05-22 LAB — BASIC METABOLIC PANEL
Anion gap: 5 (ref 5–15)
BUN: 22 mg/dL (ref 8–23)
CO2: 24 mmol/L (ref 22–32)
Calcium: 8.8 mg/dL — ABNORMAL LOW (ref 8.9–10.3)
Chloride: 107 mmol/L (ref 98–111)
Creatinine, Ser: 1.05 mg/dL (ref 0.61–1.24)
GFR calc Af Amer: >60 mL/min (ref >60)
GFR calc non Af Amer: >60 mL/min (ref >60)
Glucose, Bld: 157 mg/dL — ABNORMAL HIGH (ref 70–99)
Potassium: 3.9 mmol/L (ref 3.5–5.1)
Sodium: 136 mmol/L (ref 135–145)

## 2018-05-22 LAB — HEPARIN LEVEL (UNFRACTIONATED)
Heparin Unfractionated: 0.21 IU/mL — ABNORMAL LOW (ref 0.30–0.70)
Heparin Unfractionated: 0.33 IU/mL (ref 0.30–0.70)

## 2018-05-22 LAB — CARDIAC CATHETERIZATION: Cath EF Quantitative: 60 %

## 2018-05-22 SURGERY — LEFT HEART CATH AND CORS/GRAFTS ANGIOGRAPHY
Anesthesia: Moderate Sedation

## 2018-05-22 MED ORDER — MIDAZOLAM HCL 2 MG/2ML IJ SOLN
INTRAMUSCULAR | Status: AC
  Filled 2018-05-22: qty 2

## 2018-05-22 MED ORDER — METFORMIN HCL 1000 MG PO TABS: 1000.0000 mg | ORAL_TABLET | Freq: Two times a day (BID) | ORAL | Status: DC

## 2018-05-22 MED ORDER — MIDAZOLAM HCL 2 MG/2ML IJ SOLN
INTRAMUSCULAR | Status: DC | PRN
  Administered 2018-05-22: 1 mg via INTRAVENOUS

## 2018-05-22 MED ORDER — IOPAMIDOL (ISOVUE-300) INJECTION 61%
INTRAVENOUS | Status: DC | PRN
  Administered 2018-05-22: 175 mL via INTRA_ARTERIAL

## 2018-05-22 MED ORDER — FENTANYL CITRATE (PF) 100 MCG/2ML IJ SOLN
INTRAMUSCULAR | Status: AC
  Filled 2018-05-22: qty 2

## 2018-05-22 MED ORDER — SODIUM CHLORIDE 0.9 % WEIGHT BASED INFUSION: 1.0000 mL/kg/h | INTRAVENOUS | Status: DC

## 2018-05-22 MED ORDER — HEPARIN (PORCINE) IN NACL 1000-0.9 UT/500ML-% IV SOLN
INTRAVENOUS | Status: AC
  Filled 2018-05-22: qty 1000

## 2018-05-22 MED ORDER — FENTANYL CITRATE (PF) 100 MCG/2ML IJ SOLN
INTRAMUSCULAR | Status: DC | PRN
  Administered 2018-05-22: 25 ug via INTRAVENOUS

## 2018-05-22 MED ORDER — ASPIRIN 81 MG PO CHEW: 81.0000 mg | CHEWABLE_TABLET | ORAL | Status: DC

## 2018-05-22 MED ORDER — SODIUM CHLORIDE 0.9 % WEIGHT BASED INFUSION
3.0000 mL/kg/h | INTRAVENOUS | Status: DC
  Administered 2018-05-22: 3 mL/kg/h via INTRAVENOUS

## 2018-05-22 SURGICAL SUPPLY — 12 items
CATH INFINITI 5 FR IM (CATHETERS) ×2 IMPLANT
CATH INFINITI 5FR ANG PIGTAIL (CATHETERS) ×2 IMPLANT
CATH INFINITI 5FR JL4 (CATHETERS) ×2 IMPLANT
CATH INFINITI JR4 5F (CATHETERS) ×2 IMPLANT
DEVICE CLOSURE MYNXGRIP 5F (Vascular Products) ×2 IMPLANT
KIT MANI 3VAL PERCEP (MISCELLANEOUS) ×2 IMPLANT
NEEDLE PERC 18GX7CM (NEEDLE) ×2 IMPLANT
NEEDLE SMART REG 18GX2-3/4 (NEEDLE) ×2 IMPLANT
PACK CARDIAC CATH (CUSTOM PROCEDURE TRAY) ×2 IMPLANT
SHEATH AVANTI 5FR X 11CM (SHEATH) ×2 IMPLANT
WIRE EMERALD 3MM-J .035X260CM (WIRE) ×2 IMPLANT
WIRE GUIDERIGHT .035X150 (WIRE) ×2 IMPLANT

## 2018-05-22 NOTE — Progress Notes (Signed)
Cardiac catheterization  Final Conclusions:  Known ostial occluded LAD and occluded SVG to OM1/ramus based on catheterization 2018 Moderate distal left main disease, perhaps slightly worse, medical management Patient LIMA to the RCA,  No significant change from cardiac cath in 2018 at [facility]  Recommendations:  Medical management recommended Continue asa statin, b-blocker He is established with cardiology at [facility], needs f/u post cath  Signed, Esmond Plants, MD, Ph.D [facility]

## 2018-05-22 NOTE — Progress Notes (Signed)
Patient given discharge instructions. IV's taken out and tele monitor off. Patient verbalized understanding with no further questions or concerns. Patient's friend picking him up.

## 2018-05-22 NOTE — Discharge Instructions (Signed)
Coronary Artery Disease, Male  Coronary artery disease (CAD) is a condition in which the arteries that lead to the heart (coronary arteries) become narrow or blocked. The narrowing or blockage can lead to decreased blood flow to the heart. Prolonged reduced blood flow can cause a heart attack (myocardial infarction or MI). This condition may also be called coronary heart disease.  Because CAD is the leading cause of death in men, it is important to understand what causes this condition and how it is treated.  What are the causes?  CAD is most often caused by atherosclerosis. This is the buildup of fat and cholesterol (plaque) on the inside of the arteries. Over time, the plaque may narrow or block the artery, reducing blood flow to the heart. Plaque can also become weak and break off within a coronary artery and cause a sudden blockage. Other less common causes of CAD include:   An embolism or blood clot in a coronary artery.   A tearing of the artery (spontaneous coronary artery dissection).   An aneurysm.   Inflammation (vasculitis) in the artery wall.    What increases the risk?  The following factors may make you more likely to develop this condition:   Age. Men over age 45 are at a greater risk of CAD.   Family history of CAD.   Gender. Men often develop CAD earlier in life than women.   High blood pressure (hypertension).   Diabetes.   High cholesterol levels.   Tobacco use.   Excessive alcohol use.   Lack of exercise.   A diet high in saturated and trans fats, such as fried food and processed meat.    Other possible risk factors include:   High stress levels.   Depression.   Obesity.   Sleep apnea.    What are the signs or symptoms?  Many people do not have any symptoms during the early stages of CAD. As the condition progresses, symptoms may include:   Chest pain (angina). The pain can:  ? Feel like a crushing or squeezing, or a tightness, pressure, fullness, or heaviness in the  chest.  ? Last more than a few minutes or can stop and recur. The pain tends to get worse with exercise or stress and to fade with rest.   Pain in the arms, neck, jaw, or back.   Unexplained heartburn or indigestion.   Shortness of breath.   Nausea or vomiting.   Sudden light-headedness.   Sudden cold sweats.   Fluttering or fast heartbeat (palpitations).    How is this diagnosed?  This condition is diagnosed based on:   Your family and medical history.   A physical exam.   Tests, including:  ? A test to check the electrical signals in your heart (electrocardiogram).  ? Exercise stress test. This looks for signs of blockage when the heart is stressed with exercise, such as running on a treadmill.  ? Pharmacologic stress test. This test looks for signs of blockage when the heart is being stressed with a medicine.  ? Blood tests.  ? Coronary angiogram. This is a procedure to look at the coronary arteries to see if there is any blockage. During this test, a dye is injected into your arteries so they appear on an X-ray.  ? A test that uses sound waves to take a picture of your heart (echocardiogram).  ? Chest X-ray.    How is this treated?  This condition may be treated by:     Healthy lifestyle changes to reduce risk factors.   Medicines such as:  ? Antiplatelet medicines and blood-thinning medicines, such as aspirin. These help to prevent blood clots.  ? Nitroglycerin.  ? Blood pressure medicines.  ? Cholesterol-lowering medicine.   Coronary angioplasty and stenting. During this procedure, a thin, flexible tube is inserted through a blood vessel and into a blocked artery. A balloon or similar device on the end of the tube is inflated to open up the artery. In some cases, a small, mesh tube (stent) is inserted into the artery to keep it open.   Coronary artery bypass surgery. During this surgery, veins or arteries from other parts of the body are used to create a bypass around the blockage and allow blood  to reach your heart.    Follow these instructions at home:  Medicines   Take over-the-counter and prescription medicines only as told by your health care provider.   Do not take the following medicines unless your health care provider approves:  ? NSAIDs, such as ibuprofen, naproxen, or celecoxib.  ? Vitamin supplements that contain vitamin A, vitamin E, or both.  Lifestyle   Follow an exercise program approved by your health care provider. Aim for 150 minutes of moderate exercise or 75 minutes of vigorous exercise each week.   Maintain a healthy weight or lose weight as approved by your health care provider.   Rest when you are tired.   Learn to manage stress or try to limit your stress. Ask your health care provider for suggestions if you need help.   Get screened for depression and seek treatment, if needed.   Do not use any products that contain nicotine or tobacco, such as cigarettes and e-cigarettes. If you need help quitting, ask your health care provider.   Do not use illegal drugs.  Eating and drinking   Follow a heart-healthy diet. A dietitian can help educate you about healthy food options and changes. In general, eat plenty of fruits and vegetables, lean meats, and whole grains.   Avoid foods high in:  ? Sugar.  ? Salt (sodium).  ? Saturated fat, such as processed or fatty meat.  ? Trans fat, such as fried foods.   Use healthy cooking methods such as roasting, grilling, broiling, baking, poaching, steaming, or stir-frying.   If you drink alcohol, and your health care provider approves, limit your alcohol intake to no more than 2 drinks per day. One drink equals 12 ounces of beer, 5 ounces of wine, or 1 ounces of hard liquor.  General instructions   Manage any other health conditions, such as hypertension and diabetes. These conditions affect your heart.   Your health care provider may ask you to monitor your blood pressure. Ideally, your blood pressure should be below 130/80.   Keep all  follow-up visits as told by your health care provider. This is important.  Get help right away if:   You have pain in your chest, neck, arm, jaw, stomach, or back that:  ? Lasts more than a few minutes.  ? Is recurring.  ? Is not relieved by taking medicine under your tongue (sublingualnitroglycerin).   You have too much (profuse) sweating without cause.   You have unexplained:  ? Heartburn or indigestion.  ? Shortness of breath or difficulty breathing.  ? Fluttering or fast heartbeat (palpitations).  ? Nausea or vomiting.  ? Fatigue.  ? Feelings of nervousness or anxiety.  ? Weakness.  ? Diarrhea.   You   have sudden light-headedness or dizziness.   You faint.   You feel like hurting yourself or think about taking your own life.  These symptoms may represent a serious problem that is an emergency. Do not wait to see if the symptoms will go away. Get medical help right away. Call your local emergency services (911 in the [place] Do not drive yourself to the hospital.  Summary   Coronary artery disease (CAD) is a process in which the arteries that lead to the heart (coronary arteries) become narrow or blocked. The narrowing or blockage can lead to a heart attack.   Many people do not have any symptoms during the early stages of CAD. This is called "silent CAD."   CAD can be treated with lifestyle changes, medicines, surgery, or a combination of these treatments.  This information is not intended to replace advice given to you by your health care provider. Make sure you discuss any questions you have with your health care provider.  Document Released: 12/31/2013 Document Revised: 05/26/2016 Document Reviewed: 05/26/2016  Elsevier Interactive Patient Education  2018 [employer]

## 2018-05-22 NOTE — Progress Notes (Signed)
Progress Note  Patient Name: Peter Robertson Date of Encounter: 05/22/2018  Primary Cardiologist: [facility]  Subjective   Concerned about having procedure done at [facility] No beds available at [facility] After further discussion will have procedure done at [facility] Left-sided chest pain, Also discussed recent findings on stress test, small region of ischemia, known coronary artery disease, bypass, already has occluded graft to the OM  Inpatient Medications    Scheduled Meds: . [START ON 05/23/2018] aspirin  81 mg Oral Pre-Cath  . [MAR Hold] aspirin EC  81 mg Oral Daily  . [MAR Hold] atorvastatin  80 mg Oral QHS  . [MAR Hold] cholecalciferol  1,000 Units Oral BID  . [MAR Hold] clopidogrel  75 mg Oral Q breakfast  . [MAR Hold] insulin aspart  0-5 Units Subcutaneous QHS  . [MAR Hold] insulin aspart  0-9 Units Subcutaneous TID WC  . [MAR Hold] insulin glargine  24 Units Subcutaneous QHS  . [MAR Hold] lisinopril  5 mg Oral QHS  . [MAR Hold] metoprolol succinate  50 mg Oral QHS  . [MAR Hold] sertraline  150 mg Oral QHS  . sodium chloride flush  3 mL Intravenous Q12H  . [MAR Hold] tamsulosin  0.4 mg Oral QHS   Continuous Infusions: . sodium chloride    . sodium chloride 50 mL/hr at 05/22/18 0849  . [START ON 05/23/2018] sodium chloride 3 mL/kg/hr (05/22/18 0920)   Followed by  . [START ON 05/23/2018] sodium chloride    . heparin Stopped (05/22/18 0908)  . nitroGLYCERIN 5 mcg/min (05/21/18 0607)   PRN Meds: sodium chloride, [MAR Hold] acetaminophen, [MAR Hold] ALPRAZolam, [MAR Hold] nitroGLYCERIN, [MAR Hold] nitroGLYCERIN, [MAR Hold] ondansetron (ZOFRAN) IV, sodium chloride flush, [MAR Hold] zolpidem   Vital Signs    Vitals:   05/22/18 0825 05/22/18 0910 05/22/18 1115 05/22/18 1130  BP: 111/62 135/73 107/65 113/64  Pulse: (!) 40 72 67 64  Resp:  18 16 14   Temp: 98.7 F (37.1 C) 97.9 F (36.6 C)    TempSrc: Oral Oral    SpO2: 93% 96% 94% 96%  Weight:      Height:        Intake/Output  Summary (Last 24 hours) at 05/22/2018 1140 Last data filed at 05/22/2018 0847 Gross per 24 hour  Intake 1943.98 ml  Output 1525 ml  Net 418.98 ml   Filed Weights   05/20/18 1247 05/20/18 2353 05/22/18 0416  Weight: 89.8 kg 86.3 kg 85.5 kg    Telemetry    Normal sinus rhythm- Personally Reviewed  ECG    n/a - Personally Reviewed  Physical Exam   Constitutional:  oriented to person, place, and time. No distress.  HENT:  Head: Grossly normal Eyes:  no discharge. No scleral icterus.  Neck: No JVD, no carotid bruits  Cardiovascular: Regular rate and rhythm, no murmurs appreciated Pulmonary/Chest: Clear to auscultation bilaterally, no wheezes or rails Abdominal: Soft.  no distension.  no tenderness.  Musculoskeletal: Normal range of motion Neurological:  normal muscle tone. Coordination normal. No atrophy Skin: Skin warm and dry Psychiatric: normal affect, pleasant   Labs    Chemistry Recent Labs  Lab 05/20/18 1248 05/22/18 0455  NA 135 136  K 4.8 3.9  CL 102 107  CO2 25 24  GLUCOSE 344* 157*  BUN 33* 22  CREATININE 1.26* 1.05  CALCIUM 9.3 8.8*  PROT 6.9  --   ALBUMIN 4.3  --   AST 17  --   ALT 23  --  ALKPHOS 66  --   BILITOT 0.9  --   GFRNONAA 54* >60  GFRAA >60 >60  ANIONGAP 8 5     Hematology Recent Labs  Lab 05/20/18 1248 05/21/18 0235 05/22/18 0455  WBC 6.8 7.9 6.1  RBC 4.61 4.08* 4.19*  HGB 13.0 11.5* 11.7*  HCT 38.3* 33.7* 34.8*  MCV 83.1 82.6 83.1  MCH 28.2 28.2 27.9  MCHC 33.9 34.1 33.6  RDW 14.3 14.3 14.0  PLT 105* 111* 96*    Cardiac Enzymes Recent Labs  Lab 05/20/18 1248 05/20/18 2057 05/21/18 0235  TROPONINI <0.03 <0.03 <0.03   No results for input(s): TROPIPOC in the last 168 hours.   BNPNo results for input(s): BNP, PROBNP in the last 168 hours.   DDimer No results for input(s): DDIMER in the last 168 hours.   Radiology    Dg Chest Portable 1 View  Result Date: 05/20/2018 IMPRESSION: No acute findings. Patchy  bilateral nodular opacification and calcification improved and compatible with known sarcoidosis Electronically Signed   By: Marin Olp M.D.   On: 05/20/2018 13:13    Cardiac Studies   LHC 01/2017: Findings: 1. Coronary artery disease including a distal left main 50% stenosis,  proximal ramus 80% stenosis, and occluded ostial LAD. 2. Prior coronary bypass surgery with patent LIMA and occluded SVG to OM.  3. Decreased left ventricular filling pressures (LVEDP = 5 mm Hg). 4. Normal left ventricular systolic function (estimated LVEF = 55 %).  Recommendations: 1. Medical management. 2. Follow up with primary cardiologist.  Complications:  None  Patient Profile     77 y.o. male with history of CAD s/p 2-vessel CABG in 2011 with LIMA to LAD and SVG to )M with cath in 01/2017 showing 50% distal LM stenosis, 80% proximal ramus stenosis, occluded LAD with a patent LIMA to LAD and an occluded SVG to OM1. He also has history of DM2, CKD, HTN, and COPD. We are asked to evaluate the patient for unstable angina.   Assessment & Plan    1. CAD s/p CABG with unstable angina: Cardiac enzymes negative, Long discussion with him concerning timing of cardiac catheterization Whether to perform at [facility] at a later date or this admission at this facility After discussion he prefers to have cardiac catheterization at this hospital on this admission I have reviewed the risks, indications, and alternatives to cardiac catheterization, possible angioplasty, and stenting with the patient. Risks include but are not limited to bleeding, infection, vascular injury, stroke, myocardial infection, arrhythmia, kidney injury, radiation-related injury in the case of prolonged fluoroscopy use, emergency cardiac surgery, and death. The patient understands the risks of serious complication is 1-2 in 2992 with diagnostic cardiac cath and 1-2% or less with angioplasty/stenting.  --He has been placed on the schedule for this  morning Currently n.p.o.  2. HTN: Blood pressure well controlled, no changes to his medications  3. HLD: -Recent LDL of 27 from 01/2018 Continue Lipitor, at goal  4. CKD stage II: Recommend fluids following procedure later this morning  5. Thrombocytopenia: -Stable   Total encounter time more than 25 minutes  Greater than 50% was spent in counseling and coordination of care with the patient   For questions or updates, please contact [place] Please consult [email or web address] for contact info under Cardiology/STEMI.    Signed, Esmond Plants, MD, Ph.D [facility]

## 2018-05-22 NOTE — Plan of Care (Signed)
  Problem: Education: Goal: Knowledge of General Education information will improve Description: Including pain rating scale, medication(s)/side effects and non-pharmacologic comfort measures Outcome: Adequate for Discharge   Problem: Health Behavior/Discharge Planning: Goal: Ability to manage health-related needs will improve Outcome: Adequate for Discharge   Problem: Clinical Measurements: Goal: Ability to maintain clinical measurements within normal limits will improve Outcome: Adequate for Discharge Goal: Will remain free from infection Outcome: Adequate for Discharge Goal: Diagnostic test results will improve Outcome: Adequate for Discharge Goal: Respiratory complications will improve Outcome: Adequate for Discharge Goal: Cardiovascular complication will be avoided Outcome: Adequate for Discharge   Problem: Activity: Goal: Risk for activity intolerance will decrease Outcome: Adequate for Discharge   Problem: Nutrition: Goal: Adequate nutrition will be maintained Outcome: Adequate for Discharge   Problem: Coping: Goal: Level of anxiety will decrease Outcome: Adequate for Discharge   Problem: Elimination: Goal: Will not experience complications related to bowel motility Outcome: Adequate for Discharge Goal: Will not experience complications related to urinary retention Outcome: Adequate for Discharge   Problem: Pain Managment: Goal: General experience of comfort will improve Outcome: Adequate for Discharge   Problem: Safety: Goal: Ability to remain free from injury will improve Outcome: Adequate for Discharge   Problem: Skin Integrity: Goal: Risk for impaired skin integrity will decrease Outcome: Adequate for Discharge   Problem: Education: Goal: Understanding of cardiac disease, CV risk reduction, and recovery process will improve Outcome: Adequate for Discharge Goal: Individualized Educational Video(s) Outcome: Adequate for Discharge   Problem:  Activity: Goal: Ability to tolerate increased activity will improve Outcome: Adequate for Discharge   Problem: Cardiac: Goal: Ability to achieve and maintain adequate cardiovascular perfusion will improve Outcome: Adequate for Discharge   Problem: Health Behavior/Discharge Planning: Goal: Ability to safely manage health-related needs after discharge will improve Outcome: Adequate for Discharge   Problem: Education: Goal: Understanding of CV disease, CV risk reduction, and recovery process will improve Outcome: Adequate for Discharge Goal: Individualized Educational Video(s) Outcome: Adequate for Discharge   Problem: Activity: Goal: Ability to return to baseline activity level will improve Outcome: Adequate for Discharge   Problem: Cardiovascular: Goal: Ability to achieve and maintain adequate cardiovascular perfusion will improve Outcome: Adequate for Discharge Goal: Vascular access site(s) Level 0-1 will be maintained Outcome: Adequate for Discharge   Problem: Health Behavior/Discharge Planning: Goal: Ability to safely manage health-related needs after discharge will improve Outcome: Adequate for Discharge   

## 2018-05-22 NOTE — Care Management (Addendum)
Patient discharging home and agreeable to home health services.  Agency preference is [facility] Services ordered RN PT Aide.  He is followed by  " a foreign lady doctor" at [facility].  Says he can not pronounce her name.  Says if he needs rides to appointments, his neighbor helps him.  Lives alone- wife died three years ago.  Has access to a walker in the home. Confirmed contact information

## 2018-05-22 NOTE — Consult Note (Signed)
ANTICOAGULATION CONSULT NOTE - Initial Consult  Pharmacy Consult for Heparin Indication: chest pain/ACS  Allergies  Allergen Reactions  . Tape Rash    blisters    Patient Measurements: Height: 5\' 6"  (167.6 cm) Weight: 190 lb 4.1 oz (86.3 kg) IBW/kg (Calculated) : 63.8 Heparin Dosing Weight: 82.8  Vital Signs: Temp: 98.3 F (36.8 C) (12/03 2143) Temp Source: Oral (12/03 2143) BP: 146/74 (12/03 2143) Pulse Rate: 69 (12/03 2143)  Labs: Recent Labs    05/20/18 1248 05/20/18 2057  05/21/18 0235 05/21/18 0711 05/21/18 1501 05/22/18 0001  HGB 13.0  --   --  11.5*  --   --   --   HCT 38.3*  --   --  33.7*  --   --   --   PLT 105*  --   --  111*  --   --   --   APTT 31  --   --   --   --   --   --   LABPROT 13.8  --   --   --   --   --   --   INR 1.07  --   --   --   --   --   --   HEPARINUNFRC  --   --    < >  --  0.35 0.28* 0.33  CREATININE 1.26*  --   --   --   --   --   --   TROPONINI <0.03 <0.03  --  <0.03  --   --   --    < > = values in this interval not displayed.    Estimated Creatinine Clearance: 49.8 mL/min (A) (by C-G formula based on SCr of 1.26 mg/dL (H)).   Medical History: Past Medical History:  Diagnosis Date  . Asthma   . BPH (benign prostatic hyperplasia)   . Chronic kidney disease    Kidney Stones  . Coronary artery disease   . Diabetes mellitus without complication ([place])   . Heart murmur   . Hyperlipidemia   . Hypertension   . Myocardial infarction [facility])     Assessment: 77 year old male admitted with chest pain. No anticoagulation PTA. Pharmacy has been consulted for heparin dosing for unstable angina (ACS/STEMI).  Plan for cath tomorrow.  Goal of Therapy:  Heparin level 0.3-0.7 units/ml Monitor platelets by anticoagulation protocol: Yes   Plan:  12/04 @ 0000 HL 0.33 therapeutic. Will continue current rate and will recheck next HL @ 0800, hgb has trended down will continue to monitor.  Tobie Lords, PharmD, BCPS Clinical  Pharmacist 05/22/2018

## 2018-05-23 NOTE — Discharge Summary (Signed)
[place] [facility] [place] NAME: Peter Robertson    MR#:  [id number]  DATE OF BIRTH:  October 09, 1940  DATE OF ADMISSION:  05/20/2018   ADMITTING PHYSICIAN: Demetrios Loll, MD  DATE OF DISCHARGE: 05/22/2018  3:08 PM  PRIMARY CARE PHYSICIAN: Danae Orleans, MD  ADMISSION DIAGNOSIS:  Precordial pain [R07.2] Unstable angina ([place]) [I20.0] DISCHARGE DIAGNOSIS:  Active Problems:   Unstable angina ([place])  SECONDARY DIAGNOSIS:   Past Medical History:  Diagnosis Date  . Asthma   . BPH (benign prostatic hyperplasia)   . Chronic kidney disease    Kidney Stones  . Coronary artery disease   . Diabetes mellitus without complication ([place])   . Heart murmur   . Hyperlipidemia   . Hypertension   . Myocardial infarction [facility])    HOSPITAL COURSE:  77 y.o. male with history of CAD s/p 2-vessel CABG in 2011 with LIMA to LAD and SVG to )M with cath in 01/2017 showing 50% distal LM stenosis, 80% proximal ramus stenosis, occluded LAD with a patent LIMA to LAD and an occluded SVG to OM1. He also has history of DM2, CKD, HTN, and COPD. We are asked to evaluate the patient for unstable angina.   1. CAD s/p CABG with unstable angina: - s/p cath on 12/4 shows Known ostial occluded LAD and occluded SVG to OM1/ramus based on catheterization 2018.  Moderate distal left main disease, perhaps slightly worse, medical management Patient LIMA to the RCA, No significant change from cardiac cath in 2018 at [facility] f/up - Medical management recommended - Continue asa statin, b-blocker  2. HTN: Blood pressure well controlled, no changes to his medications  3. HLD: -Recent LDL of 27 from 01/2018 Continue Lipitor, at goal  4. CKD stage II: -at Baseline  5. Thrombocytopenia: - Stable, consider outpatient evaluation with hematology if continues to be problem  6.  COPD -Stable, outpatient follow-up with PCP DISCHARGE CONDITIONS:  stable CONSULTS OBTAINED:    Treatment Team:  Wellington Hampshire, MD End, Harrell Gave, MD DRUG ALLERGIES:   Allergies  Allergen Reactions  . Tape Rash    blisters   DISCHARGE MEDICATIONS:   Allergies as of 05/22/2018      Reactions   Tape Rash   blisters      Medication List    TAKE these medications   aspirin EC 81 MG tablet Take 81 mg by mouth at bedtime.   atorvastatin 80 MG tablet Commonly known as:  LIPITOR Take 80 mg by mouth at bedtime.   cholecalciferol 25 MCG (1000 UT) tablet Commonly known as:  VITAMIN D3 Take 1,000 Units by mouth 2 (two) times daily.   furosemide 20 MG tablet Commonly known as:  LASIX Take 1 tablet (20 mg total) by mouth daily. do not take the Lasix every day take 1 pill every other day. What changed:    when to take this  additional instructions   insulin glargine 100 UNIT/ML injection Commonly known as:  LANTUS Inject 24 Units into the skin at bedtime.   lisinopril 5 MG tablet Commonly known as:  PRINIVIL,ZESTRIL Take 5 mg by mouth at bedtime.   metFORMIN 1000 MG tablet Commonly known as:  GLUCOPHAGE Take 1 tablet (1,000 mg total) by mouth 2 (two) times daily with a meal. Hold it for 48 hrs due to cath/dye load and resume it on 05/24/2018 What changed:  additional instructions   metoprolol succinate 50 MG 24 hr tablet  Commonly known as:  TOPROL-XL Take 50 mg by mouth at bedtime.   nitroGLYCERIN 0.4 MG SL tablet Commonly known as:  NITROSTAT Place 0.4 mg under the tongue every 5 (five) minutes as needed for chest pain.   sertraline 100 MG tablet Commonly known as:  ZOLOFT Take 150 mg by mouth at bedtime.   tamsulosin 0.4 MG Caps capsule Commonly known as:  FLOMAX Take 0.4 mg by mouth at bedtime.        DISCHARGE INSTRUCTIONS:   DIET:  Cardiac diet DISCHARGE CONDITION:  Good ACTIVITY:  Activity as tolerated OXYGEN:  Home Oxygen: No.  Oxygen Delivery: room air DISCHARGE LOCATION:  home with home health, PT, RN  If you experience  worsening of your admission symptoms, develop shortness of breath, life threatening emergency, suicidal or homicidal thoughts you must seek medical attention immediately by calling 911 or calling your MD immediately  if symptoms less severe.  You Must read complete instructions/literature along with all the possible adverse reactions/side effects for all the Medicines you take and that have been prescribed to you. Take any new Medicines after you have completely understood and accpet all the possible adverse reactions/side effects.   Please note  You were cared for by a hospitalist during your hospital stay. If you have any questions about your discharge medications or the care you received while you were in the hospital after you are discharged, you can call the unit and asked to speak with the hospitalist on call if the hospitalist that took care of you is not available. Once you are discharged, your primary care physician will handle any further medical issues. Please note that NO REFILLS for any discharge medications will be authorized once you are discharged, as it is imperative that you return to your primary care physician (or establish a relationship with a primary care physician if you do not have one) for your aftercare needs so that they can reassess your need for medications and monitor your lab values.    On the day of Discharge:  VITAL SIGNS:  Blood pressure 113/64, pulse 64, temperature 97.9 F (36.6 C), temperature source Oral, resp. rate 14, height 5\' 6"  (1.676 m), weight 85.5 kg, SpO2 96 %. PHYSICAL EXAMINATION:  GENERAL:  77 y.o.-year-old patient lying in the bed with no acute distress.  EYES: Pupils equal, round, reactive to light and accommodation. No scleral icterus. Extraocular muscles intact.  HEENT: Head atraumatic, normocephalic. Oropharynx and nasopharynx clear.  NECK:  Supple, no jugular venous distention. No thyroid enlargement, no tenderness.  LUNGS: Normal breath  sounds bilaterally, no wheezing, rales,rhonchi or crepitation. No use of accessory muscles of respiration.  CARDIOVASCULAR: S1, S2 normal. No murmurs, rubs, or gallops.  ABDOMEN: Soft, non-tender, non-distended. Bowel sounds present. No organomegaly or mass.  EXTREMITIES: No pedal edema, cyanosis, or clubbing.  NEUROLOGIC: Cranial nerves II through XII are intact. Muscle strength 5/5 in all extremities. Sensation intact. Gait not checked.  PSYCHIATRIC: The patient is alert and oriented x 3.  SKIN: No obvious rash, lesion, or ulcer.  DATA REVIEW:   CBC Recent Labs  Lab 05/22/18 0455  WBC 6.1  HGB 11.7*  HCT 34.8*  PLT 96*    Chemistries  Recent Labs  Lab 05/20/18 1248 05/22/18 0455  NA 135 136  K 4.8 3.9  CL 102 107  CO2 25 24  GLUCOSE 344* 157*  BUN 33* 22  CREATININE 1.26* 1.05  CALCIUM 9.3 8.8*  AST 17  --   ALT  71  --   ALKPHOS 66  --   BILITOT 0.9  --      Follow-up Information    Danae Orleans, MD. Schedule an appointment as soon as possible for a visit on 06/04/2018.   Specialty:  Specialist Why:  Appointment Time: @ 3:00pm Contact information: [place] [address] [phone]        Lars Masson, NP. Schedule an appointment as soon as possible for a visit in 1 week.   Why:  Appointment Time: Pt will have to call and make appt Contact information: [place] [id number] [phone]            Management plans discussed with the patient, Cardio and they are in agreement.  CODE STATUS: Prior   TOTAL TIME TAKING CARE OF THIS PATIENT: 35 minutes.    Max Sane M.D on 05/23/2018 at 1:40 PM  Between 7am to 6pm - Pager - [phone]  After 6pm go to [email or web address] - password [email or web address] [facility]  [facility]  Office  [phone]  CC: Primary care physician; Danae Orleans  Note: This dictation was prepared with Dragon dictation along with smaller phrase technology. Any  transcriptional errors that result from this process are unintentional.

## 2018-08-18 ENCOUNTER — Other Ambulatory Visit: Payer: Self-pay

## 2018-08-18 ENCOUNTER — Emergency Department
Admission: EM | Admit: 2018-08-18 | Discharge: 2018-08-19 | Disposition: A | Discharge status: Home / Self Care | Payer: Medicare Other | Attending: Emergency Medicine | Admitting: Emergency Medicine

## 2018-08-18 DIAGNOSIS — Z87891 Personal history of nicotine dependence: Secondary | ICD-10-CM | POA: Insufficient documentation

## 2018-08-18 DIAGNOSIS — Z79899 Other long term (current) drug therapy: Secondary | ICD-10-CM | POA: Diagnosis not present

## 2018-08-18 DIAGNOSIS — Z7982 Long term (current) use of aspirin: Secondary | ICD-10-CM | POA: Diagnosis not present

## 2018-08-18 DIAGNOSIS — I10 Essential (primary) hypertension: Secondary | ICD-10-CM | POA: Diagnosis not present

## 2018-08-18 DIAGNOSIS — R739 Hyperglycemia, unspecified: Secondary | ICD-10-CM

## 2018-08-18 DIAGNOSIS — Z794 Long term (current) use of insulin: Secondary | ICD-10-CM | POA: Insufficient documentation

## 2018-08-18 DIAGNOSIS — E1165 Type 2 diabetes mellitus with hyperglycemia: Secondary | ICD-10-CM | POA: Insufficient documentation

## 2018-08-18 LAB — COMPREHENSIVE METABOLIC PANEL
ALT: 13 U/L (ref 0–44)
AST: 17 U/L (ref 15–41)
Albumin: 4.1 g/dL (ref 3.5–5.0)
Alkaline Phosphatase: 62 U/L (ref 38–126)
Anion gap: 9 (ref 5–15)
BUN: 35 mg/dL — ABNORMAL HIGH (ref 8–23)
CO2: 24 mmol/L (ref 22–32)
Calcium: 8.8 mg/dL — ABNORMAL LOW (ref 8.9–10.3)
Chloride: 98 mmol/L (ref 98–111)
Creatinine, Ser: 1.4 mg/dL — ABNORMAL HIGH (ref 0.61–1.24)
GFR calc Af Amer: 55 mL/min — ABNORMAL LOW (ref >60)
GFR calc non Af Amer: 48 mL/min — ABNORMAL LOW (ref >60)
Glucose, Bld: 552 mg/dL (ref 70–99)
Potassium: 4.3 mmol/L (ref 3.5–5.1)
Sodium: 131 mmol/L — ABNORMAL LOW (ref 135–145)
Total Bilirubin: 1 mg/dL (ref 0.3–1.2)
Total Protein: 6.7 g/dL (ref 6.5–8.1)

## 2018-08-18 LAB — URINALYSIS, COMPLETE (UACMP) WITH MICROSCOPIC
Bacteria, UA: NONE SEEN
Bilirubin Urine: NEGATIVE
Glucose, UA: >=500 mg/dL — AB
Hgb urine dipstick: NEGATIVE
Ketones, ur: NEGATIVE mg/dL
Leukocytes,Ua: NEGATIVE
Nitrite: NEGATIVE
Protein, ur: NEGATIVE mg/dL
Specific Gravity, Urine: 1.018 (ref 1.005–1.030)
Squamous Epithelial / HPF: NONE SEEN (ref 0–5)
pH: 6 (ref 5.0–8.0)

## 2018-08-18 LAB — CBC
HCT: 36.9 % — ABNORMAL LOW (ref 39.0–52.0)
Hemoglobin: 12.6 g/dL — ABNORMAL LOW (ref 13.0–17.0)
MCH: 28.1 pg (ref 26.0–34.0)
MCHC: 34.1 g/dL (ref 30.0–36.0)
MCV: 82.4 fL (ref 80.0–100.0)
Platelets: 106 10*3/uL — ABNORMAL LOW (ref 150–400)
RBC: 4.48 MIL/uL (ref 4.22–5.81)
RDW: 14.5 % (ref 11.5–15.5)
WBC: 5 10*3/uL (ref 4.0–10.5)
nRBC: 0 % (ref 0.0–0.2)

## 2018-08-18 LAB — GLUCOSE, CAPILLARY
Glucose-Capillary: 511 mg/dL (ref 70–99)
Glucose-Capillary: 402 mg/dL — ABNORMAL HIGH (ref 70–99)

## 2018-08-18 MED ORDER — SODIUM CHLORIDE 0.9 % IV BOLUS
1000.0000 mL | Freq: Once | INTRAVENOUS | Status: AC
  Administered 2018-08-18: 1000 mL via INTRAVENOUS

## 2018-08-18 MED ORDER — ACETAMINOPHEN 500 MG PO TABS
1000.0000 mg | ORAL_TABLET | Freq: Once | ORAL | Status: AC
  Administered 2018-08-18: 1000 mg via ORAL
  Filled 2018-08-18: qty 2

## 2018-08-18 MED ORDER — INSULIN ASPART 100 UNIT/ML ~~LOC~~ SOLN
4.0000 [IU] | Freq: Once | SUBCUTANEOUS | Status: AC
  Administered 2018-08-18: 4 [IU] via INTRAVENOUS
  Filled 2018-08-18: qty 1

## 2018-08-18 NOTE — ED Triage Notes (Signed)
Pt arrived from home with hyperglycemia. CBG was 501 on ems arrival. Pt has not had any insulin today. Pt has had eye surgery last week.

## 2018-08-18 NOTE — Discharge Instructions (Signed)
Please drink plenty of non-sugary fluids.  Please take your home medications including insulin as prescribed by your doctor.  Please follow-up with your doctor in the next 24 to 48 hours for recheck/reevaluation.  Return to the emergency department for any symptoms personally concerning to yourself.

## 2018-08-18 NOTE — ED Provider Notes (Signed)
[facility] Emergency Department Provider Note  Time seen: 8:22 PM  I have reviewed the triage vital signs and the nursing notes.   HISTORY  Chief Complaint Hyperglycemia   HPI Peter Robertson is a 78 y.o. male with a past medical history of BPH, CKD, CAD, diabetes, hypertension, hyperlipidemia, MI, presents to the emergency department for hyperglycemia.  According to the patient he has been taking his medications as prescribed over the past few days his blood glucose has continued to elevate.  Today it elevated to 500 so he called EMS.  Patient denies any recent illnesses cough congestion fever nausea vomiting, diarrhea or dysuria.  Largely negative review of systems.  Patient did have a recent right eye lens replacement but is not on any oral steroids.   Past Medical History:  Diagnosis Date  . Asthma   . BPH (benign prostatic hyperplasia)   . Chronic kidney disease    Kidney Stones  . Coronary artery disease   . Diabetes mellitus without complication ([place])   . Heart murmur   . Hyperlipidemia   . Hypertension   . Myocardial infarction [facility])     Patient Active Problem List   Diagnosis Date Noted  . Unstable angina ([place]) 05/20/2018  . Chest pain 01/30/2017    Past Surgical History:  Procedure Laterality Date  . COLONOSCOPY WITH PROPOFOL N/A 12/03/2014   Procedure: COLONOSCOPY WITH PROPOFOL;  Surgeon: Manya Silvas, MD;  Location: [facility] ENDOSCOPY;  Service: Endoscopy;  Laterality: N/A;  . CORONARY ARTERY BYPASS GRAFT    . LEFT HEART CATH AND CORS/GRAFTS ANGIOGRAPHY N/A 05/22/2018   Procedure: LEFT HEART CATH AND CORS/GRAFTS ANGIOGRAPHY poss PCI;  Surgeon: Minna Merritts, MD;  Location: [facility];  Service: Cardiovascular;  Laterality: N/A;    Prior to Admission medications   Medication Sig Start Date End Date Taking? Authorizing Provider  aspirin EC 81 MG tablet Take 81 mg by mouth at bedtime.     [provider]  atorvastatin  (LIPITOR) 80 MG tablet Take 80 mg by mouth at bedtime.    [provider]  cholecalciferol (VITAMIN D3) 25 MCG (1000 UT) tablet Take 1,000 Units by mouth 2 (two) times daily.    [provider]  furosemide (LASIX) 20 MG tablet Take 1 tablet (20 mg total) by mouth daily. do not take the Lasix every day take 1 pill every other day. Patient taking differently: Take 20 mg by mouth every other day.  06/29/17 06/29/18  Nena Polio, MD  insulin glargine (LANTUS) 100 UNIT/ML injection Inject 24 Units into the skin at bedtime.     [provider]  lisinopril (PRINIVIL,ZESTRIL) 5 MG tablet Take 5 mg by mouth at bedtime.     [provider]  metFORMIN (GLUCOPHAGE) 1000 MG tablet Take 1 tablet (1,000 mg total) by mouth 2 (two) times daily with a meal. Hold it for 48 hrs due to cath/dye load and resume it on 05/24/2018 05/22/18   Max Sane, MD  metoprolol succinate (TOPROL-XL) 50 MG 24 hr tablet Take 50 mg by mouth at bedtime.    [provider]  nitroGLYCERIN (NITROSTAT) 0.4 MG SL tablet Place 0.4 mg under the tongue every 5 (five) minutes as needed for chest pain.     [provider]  sertraline (ZOLOFT) 100 MG tablet Take 150 mg by mouth at bedtime.    [provider]  tamsulosin (FLOMAX) 0.4 MG CAPS capsule Take 0.4 mg by mouth  at bedtime.     [provider]    Allergies  Allergen Reactions  . Tape Rash    blisters    Family History  Problem Relation Age of Onset  . Diabetes Mother   . Cancer Mother   . Hypertension Father   . Hypertension Brother   . Diabetes Brother     Social History Social History   Tobacco Use  . Smoking status: Former Smoker    Last attempt to quit: 06/19/1984    Years since quitting: 34.1  . Smokeless tobacco: Never Used  Substance Use Topics  . Alcohol use: No  . Drug use: No    Review of Systems Constitutional: Negative for fever. Cardiovascular: Negative for chest pain. Respiratory:  Negative for shortness of breath. Gastrointestinal: Negative for abdominal pain, vomiting and diarrhea. Genitourinary: Positive for urinary frequency but he states this is chronic. Musculoskeletal: Negative for musculoskeletal complaints Skin: Negative for skin complaints  Neurological: Negative for headache All other ROS negative  ____________________________________________   PHYSICAL EXAM:  Constitutional: Alert and oriented. Well appearing and in no distress. Eyes: Normal exam ENT   Head: Normocephalic and atraumatic.   Mouth/Throat: Mucous membranes are moist. Cardiovascular: Normal rate, regular rhythm.  Respiratory: Normal respiratory effort without tachypnea nor retractions. Breath sounds are clear Gastrointestinal: Soft and nontender. No distention.   Musculoskeletal: Nontender with normal range of motion in all extremities.  Neurologic:  Normal speech and language. No gross focal neurologic deficits Skin:  Skin is warm, dry and intact.  Psychiatric: Mood and affect are normal.   ____________________________________________   INITIAL IMPRESSION / ASSESSMENT AND PLAN / ED COURSE  Pertinent labs & imaging results that were available during my care of the patient were reviewed by me and considered in my medical decision making (see chart for details).  Patient presents to the emergency department for elevated blood sugar.  Denies any symptoms at this time.  Blood sugar greater than 500 per EMS.  We will check labs, begin IV hydration and continue to closely monitor.  Patient agreeable to plan of care.  Patient's labs have resulted with elevated glucose 552.  Normal anion gap.  Otherwise normal labs including normal urinalysis.  We will treat with insulin and fluids and continue to closely monitor.  Patient's recheck of his blood glucose is 400.  We will dose 4 units of IV insulin and an additional bag of fluids.  As long as the patient's blood glucose is able to  decrease we will discharge patient home and he will follow-up with his doctor.  Patient agreeable to plan of care.  Patient care signed out to oncoming physician.  ____________________________________________   FINAL CLINICAL IMPRESSION(S) / ED DIAGNOSES  Hyperglycemia   Harvest Dark, MD 08/18/18 [id number]

## 2018-08-19 LAB — GLUCOSE, CAPILLARY: Glucose-Capillary: 344 mg/dL — ABNORMAL HIGH (ref 70–99)

## 2018-08-19 NOTE — ED Notes (Signed)
Pt waiting on neighbor to pick up. Pt is elderly and at high fall risk. Will keep pt in hallway till ride in AM.

## 2018-08-19 NOTE — ED Provider Notes (Signed)
-----------------------------------------   1:29 AM on 08/19/2018 -----------------------------------------  Blood sugar decreased to 344.  Patient feels fine and is eager for discharge home.  Strict return precautions given.  Patient verbalizes understanding and agrees with plan of care.   Paulette Blanch, MD 08/19/18 [phone]

## 2019-04-07 ENCOUNTER — Emergency Department: Payer: Medicare Other

## 2019-04-07 ENCOUNTER — Encounter: Payer: Self-pay | Admitting: Emergency Medicine

## 2019-04-07 ENCOUNTER — Emergency Department
Admission: EM | Admit: 2019-04-07 | Discharge: 2019-04-07 | Disposition: A | Discharge status: Home / Self Care | Payer: Medicare Other | Attending: Emergency Medicine | Admitting: Emergency Medicine

## 2019-04-07 ENCOUNTER — Other Ambulatory Visit: Payer: Self-pay

## 2019-04-07 DIAGNOSIS — Y999 Unspecified external cause status: Secondary | ICD-10-CM | POA: Diagnosis not present

## 2019-04-07 DIAGNOSIS — Z87891 Personal history of nicotine dependence: Secondary | ICD-10-CM | POA: Diagnosis not present

## 2019-04-07 DIAGNOSIS — I10 Essential (primary) hypertension: Secondary | ICD-10-CM | POA: Insufficient documentation

## 2019-04-07 DIAGNOSIS — E119 Type 2 diabetes mellitus without complications: Secondary | ICD-10-CM | POA: Diagnosis not present

## 2019-04-07 DIAGNOSIS — J45909 Unspecified asthma, uncomplicated: Secondary | ICD-10-CM | POA: Diagnosis not present

## 2019-04-07 DIAGNOSIS — I252 Old myocardial infarction: Secondary | ICD-10-CM | POA: Insufficient documentation

## 2019-04-07 DIAGNOSIS — Z79899 Other long term (current) drug therapy: Secondary | ICD-10-CM | POA: Insufficient documentation

## 2019-04-07 DIAGNOSIS — W010XXA Fall on same level from slipping, tripping and stumbling without subsequent striking against object, initial encounter: Secondary | ICD-10-CM | POA: Diagnosis not present

## 2019-04-07 DIAGNOSIS — Z951 Presence of aortocoronary bypass graft: Secondary | ICD-10-CM | POA: Diagnosis not present

## 2019-04-07 DIAGNOSIS — I2510 Atherosclerotic heart disease of native coronary artery without angina pectoris: Secondary | ICD-10-CM | POA: Diagnosis not present

## 2019-04-07 DIAGNOSIS — W19XXXA Unspecified fall, initial encounter: Secondary | ICD-10-CM

## 2019-04-07 DIAGNOSIS — Z794 Long term (current) use of insulin: Secondary | ICD-10-CM | POA: Insufficient documentation

## 2019-04-07 DIAGNOSIS — Z7982 Long term (current) use of aspirin: Secondary | ICD-10-CM | POA: Insufficient documentation

## 2019-04-07 DIAGNOSIS — S0033XA Contusion of nose, initial encounter: Secondary | ICD-10-CM | POA: Diagnosis not present

## 2019-04-07 DIAGNOSIS — Y9301 Activity, walking, marching and hiking: Secondary | ICD-10-CM | POA: Insufficient documentation

## 2019-04-07 DIAGNOSIS — Y929 Unspecified place or not applicable: Secondary | ICD-10-CM | POA: Insufficient documentation

## 2019-04-07 DIAGNOSIS — R040 Epistaxis: Secondary | ICD-10-CM

## 2019-04-07 DIAGNOSIS — S0990XA Unspecified injury of head, initial encounter: Secondary | ICD-10-CM | POA: Diagnosis present

## 2019-04-07 LAB — BASIC METABOLIC PANEL
Anion gap: 12 (ref 5–15)
BUN: 34 mg/dL — ABNORMAL HIGH (ref 8–23)
CO2: 24 mmol/L (ref 22–32)
Calcium: 9.4 mg/dL (ref 8.9–10.3)
Chloride: 103 mmol/L (ref 98–111)
Creatinine, Ser: 1.56 mg/dL — ABNORMAL HIGH (ref 0.61–1.24)
GFR calc Af Amer: 48 mL/min — ABNORMAL LOW (ref >60)
GFR calc non Af Amer: 42 mL/min — ABNORMAL LOW (ref >60)
Glucose, Bld: 206 mg/dL — ABNORMAL HIGH (ref 70–99)
Potassium: 4.2 mmol/L (ref 3.5–5.1)
Sodium: 139 mmol/L (ref 135–145)

## 2019-04-07 LAB — CBC WITH DIFFERENTIAL/PLATELET
Abs Immature Granulocytes: 0.04 10*3/uL (ref 0.00–0.07)
Basophils Absolute: 0 10*3/uL (ref 0.0–0.1)
Basophils Relative: 1 %
Eosinophils Absolute: 0.1 10*3/uL (ref 0.0–0.5)
Eosinophils Relative: 1 %
HCT: 36.2 % — ABNORMAL LOW (ref 39.0–52.0)
Hemoglobin: 12.3 g/dL — ABNORMAL LOW (ref 13.0–17.0)
Immature Granulocytes: 1 %
Lymphocytes Relative: 7 %
Lymphs Abs: 0.5 10*3/uL — ABNORMAL LOW (ref 0.7–4.0)
MCH: 27.8 pg (ref 26.0–34.0)
MCHC: 34 g/dL (ref 30.0–36.0)
MCV: 81.7 fL (ref 80.0–100.0)
Monocytes Absolute: 0.6 10*3/uL (ref 0.1–1.0)
Monocytes Relative: 8 %
Neutro Abs: 6.1 10*3/uL (ref 1.7–7.7)
Neutrophils Relative %: 82 %
Platelets: 123 10*3/uL — ABNORMAL LOW (ref 150–400)
RBC: 4.43 MIL/uL (ref 4.22–5.81)
RDW: 14.3 % (ref 11.5–15.5)
WBC: 7.5 10*3/uL (ref 4.0–10.5)
nRBC: 0 % (ref 0.0–0.2)

## 2019-04-07 LAB — URINALYSIS, COMPLETE (UACMP) WITH MICROSCOPIC
Bacteria, UA: NONE SEEN
Bilirubin Urine: NEGATIVE
Glucose, UA: NEGATIVE mg/dL
Ketones, ur: NEGATIVE mg/dL
Leukocytes,Ua: NEGATIVE
Nitrite: NEGATIVE
Protein, ur: NEGATIVE mg/dL
Specific Gravity, Urine: 1.008 (ref 1.005–1.030)
Squamous Epithelial / HPF: NONE SEEN (ref 0–5)
WBC, UA: NONE SEEN WBC/hpf (ref 0–5)
pH: 5 (ref 5.0–8.0)

## 2019-04-07 MED ORDER — OXYMETAZOLINE HCL 0.05 % NA SOLN
2.0000 | Freq: Once | NASAL | Status: DC
  Filled 2019-04-07: qty 30

## 2019-04-07 NOTE — Discharge Instructions (Addendum)
If your nose bleeds again: ° °Step 1: Blow your nose gently to remove any blood clots ° °Step 2: Apply FIRM, DIRECT PRESSURE to your NASAL BRIDGE (this is the flat part of your nose above your nostrils) for 5 minutes straight. Lean your head forward to help prevent blood from going down your throat. ° °Step 3: Remove pressure and see if your nose is still bleeding.  If it is, you can gently blow your nose again to remove any clots. ° °Step 4: Use the Afrin/oxymetazoline nasal spray that I have given you.  Spray 2 sprays into both nostrils.  Then, apply FIRM, DIRECT PRESSURE to your nasal bridge again for 5 minutes. ° °Step 5: If bleeding is not controlled, continue holding pressure and seek medical attention ° ° °Use a small amount of vaseline in your nostrils to help moisturize. You can also use over-the-counter SALINE SPRAY to help prevent your nose bleeds. ° °

## 2019-04-07 NOTE — ED Triage Notes (Signed)
Pt from home via [facility]. Per EMS for unwitnessed fall; pt walking with cane to see a neighbor when suddenly legs "gave out" and fell,landed face first, possible "broken nose" bleeding controlled by nose clamp placed by [facility].pt denies LOC/dizziness pt c/o headache. [facility] reports fall 2 weeks ago, pt st "my legs gave out", per [facility] compression fracture from previous fall. Pt presents with left ankle swelling, sking tear noted on left hand.  EDP Issacs at bedside.

## 2019-04-07 NOTE — ED Provider Notes (Signed)
[facility] Emergency Department Provider Note  ____________________________________________   First MD Initiated Contact with Patient 04/07/19 1519     (approximate)  I have reviewed the triage vital signs and the nursing notes.   HISTORY  Chief Complaint Fall    HPI Peter Robertson is a 78 y.o. male  Here with fall, weakness. Pt reports he has a h/o chronic, recurrent falls in which his legs just "give out." Fell 2-3 weeks ago and had a lumbar compression fx. States he has felt fine, was feeling fine this AM and left his house to go visit his neighbor. He reports that at the time, he began walking. He states that while walking, he felt his legs "give out" on him. He fell forward and struck his nose, which began bleeding immediately. He reports aching, sharp nose pain worse w/ movement. EMS arrived and applied pressure w/ resolution of bleeding. Reports no other new areas of pain, though he has some mild left shoulder pain that worsened after fall. As mentioned, states he felt fine prior to fall today. No recent fever, chills. No recent medications changes. No other complaints. No neck pain. No dental pain.        Past Medical History:  Diagnosis Date  . Asthma   . BPH (benign prostatic hyperplasia)   . Chronic kidney disease    Kidney Stones  . Coronary artery disease   . Diabetes mellitus without complication ([place])   . Heart murmur   . Hyperlipidemia   . Hypertension   . Myocardial infarction [facility])     Patient Active Problem List   Diagnosis Date Noted  . Unstable angina ([place]) 05/20/2018  . Chest pain 01/30/2017    Past Surgical History:  Procedure Laterality Date  . COLONOSCOPY WITH PROPOFOL N/A 12/03/2014   Procedure: COLONOSCOPY WITH PROPOFOL;  Surgeon: Manya Silvas, MD;  Location: [facility] ENDOSCOPY;  Service: Endoscopy;  Laterality: N/A;  . CORONARY ARTERY BYPASS GRAFT    . LEFT HEART CATH AND CORS/GRAFTS ANGIOGRAPHY N/A 05/22/2018    Procedure: LEFT HEART CATH AND CORS/GRAFTS ANGIOGRAPHY poss PCI;  Surgeon: Minna Merritts, MD;  Location: [place] CV LAB;  Service: Cardiovascular;  Laterality: N/A;    Prior to Admission medications   Medication Sig Start Date End Date Taking? Authorizing Provider  aspirin EC 81 MG tablet Take 81 mg by mouth at bedtime.     [provider]  atorvastatin (LIPITOR) 80 MG tablet Take 80 mg by mouth at bedtime.    [provider]  cholecalciferol (VITAMIN D3) 25 MCG (1000 UT) tablet Take 1,000 Units by mouth 2 (two) times daily.    [provider]  furosemide (LASIX) 20 MG tablet Take 1 tablet (20 mg total) by mouth daily. do not take the Lasix every day take 1 pill every other day. Patient taking differently: Take 20 mg by mouth every other day.  06/29/17 06/29/18  Nena Polio, MD  insulin glargine (LANTUS) 100 UNIT/ML injection Inject 24 Units into the skin at bedtime.     [provider]  isosorbide mononitrate (IMDUR) 60 MG 24 hr tablet Take 60 mg by mouth daily. 07/03/18   [provider]  ketorolac (ACULAR) 0.5 % ophthalmic solution Place 1 drop into the right eye 4 (four) times daily. 08/14/18   [provider]  lisinopril (PRINIVIL,ZESTRIL) 5 MG tablet Take 5 mg by mouth at bedtime.     [provider]  metFORMIN (GLUCOPHAGE) 1000  MG tablet Take 1 tablet (1,000 mg total) by mouth 2 (two) times daily with a meal. Hold it for 48 hrs due to cath/dye load and resume it on 05/24/2018 05/22/18   Max Sane, MD  metoprolol succinate (TOPROL-XL) 50 MG 24 hr tablet Take 50 mg by mouth at bedtime.    [provider]  moxifloxacin (VIGAMOX) 0.5 % ophthalmic solution Place 1 drop into the right eye 4 (four) times daily. 08/14/18   [provider]  nitroGLYCERIN (NITROSTAT) 0.4 MG SL tablet Place 0.4 mg under the tongue every 5 (five) minutes as needed for chest pain.     [provider]  prednisoLONE acetate  (PRED FORTE) 1 % ophthalmic suspension Place 1 drop into the right eye 4 (four) times daily. 08/14/18   [provider]  sertraline (ZOLOFT) 100 MG tablet Take 150 mg by mouth at bedtime.    [provider]  tamsulosin (FLOMAX) 0.4 MG CAPS capsule Take 0.4 mg by mouth at bedtime.     [provider]    Allergies Sulfamethoxazole-trimethoprim and Tape  Family History  Problem Relation Age of Onset  . Diabetes Mother   . Cancer Mother   . Hypertension Father   . Hypertension Brother   . Diabetes Brother     Social History Social History   Tobacco Use  . Smoking status: Former Smoker    Quit date: 06/19/1984    Years since quitting: 34.8  . Smokeless tobacco: Never Used  Substance Use Topics  . Alcohol use: No  . Drug use: No    Review of Systems  Review of Systems  Constitutional: Negative for chills, fatigue and fever.  HENT: Positive for facial swelling and nosebleeds. Negative for sore throat.   Respiratory: Negative for shortness of breath.   Cardiovascular: Negative for chest pain.  Gastrointestinal: Negative for abdominal pain.  Genitourinary: Negative for flank pain.  Musculoskeletal: Negative for neck pain.  Skin: Negative for rash and wound.  Allergic/Immunologic: Negative for immunocompromised state.  Neurological: Negative for weakness and numbness.  Hematological: Does not bruise/bleed easily.  All other systems reviewed and are negative.    ____________________________________________  PHYSICAL EXAM:      VITAL SIGNS: ED Triage Vitals  Enc Vitals Group     BP      Pulse      Resp      Temp      Temp src      SpO2      Weight      Height      Head Circumference      Peak Flow      Pain Score      Pain Loc      Pain Edu?      Excl. in [place]?      Physical Exam Vitals signs and nursing note reviewed.  Constitutional:      General: He is not in acute distress.    Appearance: He is well-developed.  HENT:     Head:  Normocephalic and atraumatic.     Comments: Moderate edema of nasal bridge with dried epistaxis bilaterally, left greater than right.  No active bleeding.  No midface instability or deformity.  Poor dentition,But no apparent dental trauma.  No oropharyngeal blood. Eyes:     Conjunctiva/sclera: Conjunctivae normal.  Neck:     Musculoskeletal: Neck supple.  Cardiovascular:     Rate and Rhythm: Normal rate and regular rhythm.     Heart sounds:  Normal heart sounds. No murmur. No friction rub.  Pulmonary:     Effort: Pulmonary effort is normal. No respiratory distress.     Breath sounds: Normal breath sounds. No wheezing or rales.  Abdominal:     General: There is no distension.     Palpations: Abdomen is soft.     Tenderness: There is no abdominal tenderness.  Skin:    General: Skin is warm.     Capillary Refill: Capillary refill takes less than 2 seconds.  Neurological:     Mental Status: He is alert and oriented to person, place, and time.     Motor: No abnormal muscle tone.       ____________________________________________   LABS (all labs ordered are listed, but only abnormal results are displayed)  Labs Reviewed  CBC WITH DIFFERENTIAL/PLATELET - Abnormal; Notable for the following components:      Result Value   Hemoglobin 12.3 (*)    HCT 36.2 (*)    Platelets 123 (*)    Lymphs Abs 0.5 (*)    All other components within normal limits  BASIC METABOLIC PANEL - Abnormal; Notable for the following components:   Glucose, Bld 206 (*)    BUN 34 (*)    Creatinine, Ser 1.56 (*)    GFR calc non Af Amer 42 (*)    GFR calc Af Amer 48 (*)    All other components within normal limits  URINALYSIS, COMPLETE (UACMP) WITH MICROSCOPIC - Abnormal; Notable for the following components:   Color, Urine STRAW (*)    APPearance CLEAR (*)    Hgb urine dipstick SMALL (*)    All other components within normal limits    ____________________________________________  EKG: Normal sinus  rhythm, ventricular rate 68.  QRS 132, QTc 443.  Right bundle branch block, no ischemic changes. ________________________________________  RADIOLOGY All imaging, including plain films, CT scans, and ultrasounds, independently reviewed by me, and interpretations confirmed via formal radiology reads.  ED MD interpretation:   CT head: Negative CT face: Negative CT C-spine negative Chest x-ray: Clear  Official radiology report(s): Dg Lumbar Spine Complete  Result Date: 04/07/2019 CLINICAL DATA:  Recent compression fracture. Follow-up. EXAM: LUMBAR SPINE - COMPLETE 4+ VIEW COMPARISON:  09/11/2017. FINDINGS: Five non-rib-bearing lumbar vertebrae. The previously demonstrated approximately 20% L1 superior endplate compression deformity is slightly greater,, currently estimated at 33%. No acute fracture lines or bony retropulsion seen. Mild to moderate anterior and lateral spur formation at multiple levels. Facet degenerative changes throughout the lumbar spine. No pars defects or subluxations. IMPRESSION: 1. The previously demonstrated approximately 20% L1 superior endplate compression deformity is slightly greater, currently estimated at 33%. 2. No acute fracture lines or bony retropulsion seen. 3. Multilevel degenerative changes and facet degenerative changes throughout the lumbar spine. Electronically Signed   By: Claudie Revering M.D.   On: 04/07/2019 16:19   Ct Head Wo Contrast  Result Date: 04/07/2019 CLINICAL DATA:  Facial trauma. Un witnessed fall. EXAM: CT HEAD WITHOUT CONTRAST CT MAXILLOFACIAL WITHOUT CONTRAST CT CERVICAL SPINE WITHOUT CONTRAST TECHNIQUE: Multidetector CT imaging of the head, cervical spine, and maxillofacial structures were performed using the standard protocol without intravenous contrast. Multiplanar CT image reconstructions of the cervical spine and maxillofacial structures were also generated. COMPARISON:  09/12/2017 FINDINGS: CT HEAD FINDINGS Brain: No evidence of acute  infarction, hemorrhage, hydrocephalus, extra-axial collection or mass lesion/mass effect. Prominence of the sulci and ventricles are noted compatible with brain atrophy. Vascular: No hyperdense vessel or unexpected calcification. Skull:  Normal. Negative for fracture or focal lesion. Other: None. CT MAXILLOFACIAL FINDINGS Osseous: No fracture or mandibular dislocation. No destructive process. Orbits: Negative. No traumatic or inflammatory finding. Sinuses: Mild mucosal thickening is noted involving the left frontal sinus and maxillary sinuses. No sinus fluid levels identified. Mastoid air cells are clear. Soft tissues: Negative. CT CERVICAL SPINE FINDINGS Alignment: Normal. Skull base and vertebrae: No acute fracture. No primary bone lesion or focal pathologic process. Soft tissues and spinal canal: No prevertebral fluid or swelling. Disc levels: Multi level disc space narrowing and ventral endplate spurring identified. Most advanced at C5-6 and C6-7. 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 Other: None IMPRESSION: 1. No acute intracranial abnormalities. 2. Chronic small vessel ischemic change and brain atrophy. 3. No evidence for facial bone fracture. 4. No evidence for cervical spine fracture. 5. Cervical degenerative disc disease. Electronically Signed   By: Kerby Moors M.D.   On: 04/07/2019 16:14   Ct Cervical Spine Wo Contrast  Result Date: 04/07/2019 CLINICAL DATA:  Facial trauma. Un witnessed fall. EXAM: CT HEAD WITHOUT CONTRAST CT MAXILLOFACIAL WITHOUT CONTRAST CT CERVICAL SPINE WITHOUT CONTRAST TECHNIQUE: Multidetector CT imaging of the head, cervical spine, and maxillofacial structures were performed using the standard protocol without intravenous contrast. Multiplanar CT image reconstructions of the cervical spine and maxillofacial structures were also generated. COMPARISON:  09/12/2017  FINDINGS: CT HEAD FINDINGS Brain: No evidence of acute infarction, hemorrhage, hydrocephalus, extra-axial collection or mass lesion/mass effect. Prominence of the sulci and ventricles are noted compatible with brain atrophy. Vascular: No hyperdense vessel or unexpected calcification. Skull: Normal. Negative for fracture or focal lesion. Other: None. CT MAXILLOFACIAL FINDINGS Osseous: No fracture or mandibular dislocation. No destructive process. Orbits: Negative. No traumatic or inflammatory finding. Sinuses: Mild mucosal thickening is noted involving the left frontal sinus and maxillary sinuses. No sinus fluid levels identified. Mastoid air cells are clear. Soft tissues: Negative. CT CERVICAL SPINE FINDINGS Alignment: Normal. Skull base and vertebrae: No acute fracture. No primary bone lesion or focal pathologic process. Soft tissues and spinal canal: No prevertebral fluid or swelling. Disc levels: Multi level disc space narrowing and ventral endplate spurring identified. Most advanced at C5-6 and C6-7. 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 Other: None IMPRESSION: 1. No acute intracranial abnormalities. 2. Chronic small vessel ischemic change and brain atrophy. 3. No evidence for facial bone fracture. 4. No evidence for cervical spine fracture. 5. Cervical degenerative disc disease. Electronically Signed   By: Kerby Moors M.D.   On: 04/07/2019 16:14   Dg Shoulder Left  Result Date: 04/07/2019 CLINICAL DATA:  Left shoulder pain following a fall. EXAM: LEFT SHOULDER - 2+ VIEW COMPARISON:  None. FINDINGS: There is no evidence of fracture or dislocation. There is no evidence of arthropathy or other focal bone abnormality. Soft tissues are unremarkable. IMPRESSION: No fracture or dislocation. Electronically Signed   By: Claudie Revering M.D.   On: 04/07/2019 16:15   Ct Maxillofacial Wo  Contrast  Result Date: 04/07/2019 CLINICAL DATA:  Facial trauma. Un witnessed fall. EXAM: CT HEAD WITHOUT CONTRAST CT MAXILLOFACIAL WITHOUT CONTRAST CT CERVICAL SPINE WITHOUT CONTRAST TECHNIQUE: Multidetector CT imaging of the head, cervical spine, and maxillofacial structures were performed using the standard protocol without intravenous contrast. Multiplanar CT image reconstructions of the cervical spine and maxillofacial structures were also generated. COMPARISON:  09/12/2017 FINDINGS:  CT HEAD FINDINGS Brain: No evidence of acute infarction, hemorrhage, hydrocephalus, extra-axial collection or mass lesion/mass effect. Prominence of the sulci and ventricles are noted compatible with brain atrophy. Vascular: No hyperdense vessel or unexpected calcification. Skull: Normal. Negative for fracture or focal lesion. Other: None. CT MAXILLOFACIAL FINDINGS Osseous: No fracture or mandibular dislocation. No destructive process. Orbits: Negative. No traumatic or inflammatory finding. Sinuses: Mild mucosal thickening is noted involving the left frontal sinus and maxillary sinuses. No sinus fluid levels identified. Mastoid air cells are clear. Soft tissues: Negative. CT CERVICAL SPINE FINDINGS Alignment: Normal. Skull base and vertebrae: No acute fracture. No primary bone lesion or focal pathologic process. Soft tissues and spinal canal: No prevertebral fluid or swelling. Disc levels: Multi level disc space narrowing and ventral endplate spurring identified. Most advanced at C5-6 and C6-7. 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 Other: None IMPRESSION: 1. No acute intracranial abnormalities. 2. Chronic small vessel ischemic change and brain atrophy. 3. No evidence for facial bone fracture. 4. No evidence for cervical spine fracture. 5. Cervical degenerative disc disease. Electronically Signed   By: Kerby Moors  M.D.   On: 04/07/2019 16:14    ____________________________________________  PROCEDURES   Procedure(s) performed (including Critical Care):  Procedures  ____________________________________________  INITIAL IMPRESSION / MDM / [place] / ED COURSE  As part of my medical decision making, I reviewed the following data within the [place] was evaluated in Emergency Department on 04/07/2019 for the symptoms described in the history of present illness. He was evaluated in the context of the global COVID-19 pandemic, which necessitated consideration that the patient might be at risk for infection with the SARS-CoV-2 virus that causes COVID-19. Institutional protocols and algorithms that pertain to the evaluation of patients at risk for COVID-19 are in a state of rapid change based on information released by regulatory bodies including the CDC and federal and state organizations. These policies and algorithms were followed during the patient's care in the ED.  Some ED evaluations and interventions may be delayed as a result of limited staffing during the pandemic.*   Clinical Course as of Apr 06 1825  Mon Apr 07, 2019  1540 77 yo M here with recurrent fall and subsequent facial trauma. Re: facial trauma, epistaxis is controlled on exam. No signs of septal hematoma. Suspect possible nasal fx, will check CT Head/Face/Neck given age and mechanism. No UE weakness, numbness, paresthesias or signs of cord syndrome. Will check basic labs but unfortunately, it sounds like his intermittent LE "giving out" is a known, chronic issue for which he has been seen multiple times, and denies desire for further eval at this time.   [CI]    Clinical Course User Index [CI] Duffy Bruce, MD    Medical Decision Making: 78 year old here with recurrent falls.  Lab work is overall reassuring.  He is back to his baseline.  No septal hematoma or evidence of  significant displaced nasal fracture.  Defers further evaluation or physical therapy eval for his falls.  He will go home with his neighbor, who keep an eye on them.  Return precautions given.  Discussed epistaxis management and will give him Afrin as needed at home.  ____________________________________________  FINAL CLINICAL IMPRESSION(S) / ED DIAGNOSES  Final diagnoses:  Fall, initial encounter  Epistaxis  Contusion of nose, initial encounter  MEDICATIONS GIVEN DURING THIS VISIT:  Medications  oxymetazoline (AFRIN) 0.05 % nasal spray 2 spray (has no administration in time range)     ED Discharge Orders    None       Note:  This document was prepared using Dragon voice recognition software and may include unintentional dictation errors.   Duffy Bruce, MD 04/07/19 [phone]

## 2019-04-07 NOTE — ED Notes (Signed)
Pt made aware UA collection. Unable to provide urine sample at this time.

## 2019-04-07 NOTE — ED Notes (Signed)
This RN did not DC pt.

## 2019-04-07 NOTE — ED Notes (Signed)
Lorinda Creed (neighbor) at bedside at this time.

## 2019-05-17 ENCOUNTER — Emergency Department
Admission: EM | Admit: 2019-05-17 | Discharge: 2019-05-18 | Disposition: A | Discharge status: Home / Self Care | Payer: Medicare Other | Attending: Emergency Medicine | Admitting: Emergency Medicine

## 2019-05-17 ENCOUNTER — Other Ambulatory Visit: Payer: Self-pay

## 2019-05-17 DIAGNOSIS — Z79899 Other long term (current) drug therapy: Secondary | ICD-10-CM | POA: Insufficient documentation

## 2019-05-17 DIAGNOSIS — J45909 Unspecified asthma, uncomplicated: Secondary | ICD-10-CM | POA: Insufficient documentation

## 2019-05-17 DIAGNOSIS — E1165 Type 2 diabetes mellitus with hyperglycemia: Secondary | ICD-10-CM | POA: Insufficient documentation

## 2019-05-17 DIAGNOSIS — I10 Essential (primary) hypertension: Secondary | ICD-10-CM | POA: Insufficient documentation

## 2019-05-17 DIAGNOSIS — Z87891 Personal history of nicotine dependence: Secondary | ICD-10-CM | POA: Diagnosis not present

## 2019-05-17 DIAGNOSIS — I259 Chronic ischemic heart disease, unspecified: Secondary | ICD-10-CM | POA: Diagnosis not present

## 2019-05-17 DIAGNOSIS — Z951 Presence of aortocoronary bypass graft: Secondary | ICD-10-CM | POA: Insufficient documentation

## 2019-05-17 DIAGNOSIS — Z794 Long term (current) use of insulin: Secondary | ICD-10-CM | POA: Diagnosis not present

## 2019-05-17 DIAGNOSIS — R739 Hyperglycemia, unspecified: Secondary | ICD-10-CM

## 2019-05-17 DIAGNOSIS — Z7982 Long term (current) use of aspirin: Secondary | ICD-10-CM | POA: Diagnosis not present

## 2019-05-17 LAB — COMPREHENSIVE METABOLIC PANEL
ALT: 17 U/L (ref 0–44)
AST: 15 U/L (ref 15–41)
Albumin: 4 g/dL (ref 3.5–5.0)
Alkaline Phosphatase: 90 U/L (ref 38–126)
Anion gap: 12 (ref 5–15)
BUN: 41 mg/dL — ABNORMAL HIGH (ref 8–23)
CO2: 19 mmol/L — ABNORMAL LOW (ref 22–32)
Calcium: 9.1 mg/dL (ref 8.9–10.3)
Chloride: 101 mmol/L (ref 98–111)
Creatinine, Ser: 1.43 mg/dL — ABNORMAL HIGH (ref 0.61–1.24)
GFR calc Af Amer: 54 mL/min — ABNORMAL LOW (ref >60)
GFR calc non Af Amer: 46 mL/min — ABNORMAL LOW (ref >60)
Glucose, Bld: 535 mg/dL (ref 70–99)
Potassium: 4.5 mmol/L (ref 3.5–5.1)
Sodium: 132 mmol/L — ABNORMAL LOW (ref 135–145)
Total Bilirubin: 0.9 mg/dL (ref 0.3–1.2)
Total Protein: 7.1 g/dL (ref 6.5–8.1)

## 2019-05-17 LAB — CBC
HCT: 31.1 % — ABNORMAL LOW (ref 39.0–52.0)
Hemoglobin: 10.8 g/dL — ABNORMAL LOW (ref 13.0–17.0)
MCH: 27.3 pg (ref 26.0–34.0)
MCHC: 34.7 g/dL (ref 30.0–36.0)
MCV: 78.7 fL — ABNORMAL LOW (ref 80.0–100.0)
Platelets: 96 10*3/uL — ABNORMAL LOW (ref 150–400)
RBC: 3.95 MIL/uL — ABNORMAL LOW (ref 4.22–5.81)
RDW: 14.3 % (ref 11.5–15.5)
WBC: 5.3 10*3/uL (ref 4.0–10.5)
nRBC: 0 % (ref 0.0–0.2)

## 2019-05-17 LAB — GLUCOSE, CAPILLARY
Glucose-Capillary: 526 mg/dL (ref 70–99)
Glucose-Capillary: 474 mg/dL — ABNORMAL HIGH (ref 70–99)

## 2019-05-17 LAB — BLOOD GAS, VENOUS
Acid-base deficit: 0.7 mmol/L (ref 0.0–2.0)
Bicarbonate: 24.2 mmol/L (ref 20.0–28.0)
O2 Saturation: 80.2 %
Patient temperature: 37
pCO2, Ven: 40 mmHg — ABNORMAL LOW (ref 44.0–60.0)
pH, Ven: 7.39 (ref 7.250–7.430)
pO2, Ven: 45 mmHg (ref 32.0–45.0)

## 2019-05-17 MED ORDER — INSULIN ASPART 100 UNIT/ML ~~LOC~~ SOLN
10.0000 [IU] | Freq: Once | SUBCUTANEOUS | Status: AC
  Administered 2019-05-17: 10 [IU] via INTRAVENOUS
  Filled 2019-05-17: qty 1

## 2019-05-17 MED ORDER — BLOOD GLUCOSE MONITOR KIT: PACK | 0 refills | Status: DC

## 2019-05-17 MED ORDER — SODIUM CHLORIDE 0.9 % IV BOLUS
1000.0000 mL | Freq: Once | INTRAVENOUS | Status: AC
  Administered 2019-05-17: 1000 mL via INTRAVENOUS

## 2019-05-17 NOTE — ED Triage Notes (Signed)
Patient reports unable to check his blood glucose for that past 3 days, reports compliant with medications, feeling a little weak this evening called ems to have his blood glucose checked. Denies pain shortness of breath or any other concerns.

## 2019-05-17 NOTE — ED Notes (Signed)
RN to beside for rounding. Patient reported he had just voided. Patient informed that urine specimen is needed. Patient provided with urinal, call bell, and asked to alert RN when able to provide a specimen.

## 2019-05-17 NOTE — ED Notes (Signed)
VGB drawn and sent RT called and made aware.

## 2019-05-17 NOTE — ED Provider Notes (Signed)
[facility] Emergency Department Provider Note  Time seen: 10:47 PM  I have reviewed the triage vital signs and the nursing notes.   HISTORY  Chief Complaint Hyperglycemia    HPI Peter Robertson is a 78 y.o. male with a past medical history of diabetes, hypertension, hyperlipidemia presents to the emergency department for hyperglycemia.  According to the patient over the past week or so his blood glucose monitor has been broken.  Patient states he is taking his medication including insulin.  However over the past several days has noticed significant urinary frequency.  Patient states he called EMS tonight to have them check his blood sugar which was read as high and the patient went came to the emergency department for evaluation.  Patient denies any recent nausea vomiting or diarrhea denies any dysuria but does state significant urinary frequency.  Denies any cough shortness of breath or fever.   Past Medical History:  Diagnosis Date  . Asthma   . BPH (benign prostatic hyperplasia)   . Chronic kidney disease    Kidney Stones  . Coronary artery disease   . Diabetes mellitus without complication ([place])   . Heart murmur   . Hyperlipidemia   . Hypertension   . Myocardial infarction [facility])     Patient Active Problem List   Diagnosis Date Noted  . Unstable angina ([place]) 05/20/2018  . Chest pain 01/30/2017    Past Surgical History:  Procedure Laterality Date  . COLONOSCOPY WITH PROPOFOL N/A 12/03/2014   Procedure: COLONOSCOPY WITH PROPOFOL;  Surgeon: Manya Silvas, MD;  Location: [facility] ENDOSCOPY;  Service: Endoscopy;  Laterality: N/A;  . CORONARY ARTERY BYPASS GRAFT    . LEFT HEART CATH AND CORS/GRAFTS ANGIOGRAPHY N/A 05/22/2018   Procedure: LEFT HEART CATH AND CORS/GRAFTS ANGIOGRAPHY poss PCI;  Surgeon: Minna Merritts, MD;  Location: [place] CV LAB;  Service: Cardiovascular;  Laterality: N/A;    Prior to Admission medications   Medication Sig Start  Date End Date Taking? Authorizing Provider  aspirin EC 81 MG tablet Take 81 mg by mouth at bedtime.     [provider]  atorvastatin (LIPITOR) 80 MG tablet Take 80 mg by mouth at bedtime.    [provider]  cholecalciferol (VITAMIN D3) 25 MCG (1000 UT) tablet Take 1,000 Units by mouth 2 (two) times daily.    [provider]  furosemide (LASIX) 20 MG tablet Take 1 tablet (20 mg total) by mouth daily. do not take the Lasix every day take 1 pill every other day. Patient taking differently: Take 20 mg by mouth every other day.  06/29/17 06/29/18  Nena Polio, MD  insulin glargine (LANTUS) 100 UNIT/ML injection Inject 24 Units into the skin at bedtime.     [provider]  isosorbide mononitrate (IMDUR) 60 MG 24 hr tablet Take 60 mg by mouth daily. 07/03/18   [provider]  ketorolac (ACULAR) 0.5 % ophthalmic solution Place 1 drop into the right eye 4 (four) times daily. 08/14/18   [provider]  lisinopril (PRINIVIL,ZESTRIL) 5 MG tablet Take 5 mg by mouth at bedtime.     [provider]  metFORMIN (GLUCOPHAGE) 1000 MG tablet Take 1 tablet (1,000 mg total) by mouth 2 (two) times daily with a meal. Hold it for 48 hrs due to cath/dye load and resume it on 05/24/2018 05/22/18   Max Sane, MD  metoprolol succinate (TOPROL-XL) 50 MG 24 hr tablet Take 50 mg by mouth at  bedtime.    [provider]  moxifloxacin (VIGAMOX) 0.5 % ophthalmic solution Place 1 drop into the right eye 4 (four) times daily. 08/14/18   [provider]  nitroGLYCERIN (NITROSTAT) 0.4 MG SL tablet Place 0.4 mg under the tongue every 5 (five) minutes as needed for chest pain.     [provider]  prednisoLONE acetate (PRED FORTE) 1 % ophthalmic suspension Place 1 drop into the right eye 4 (four) times daily. 08/14/18   [provider]  sertraline (ZOLOFT) 100 MG tablet Take 150 mg by mouth at bedtime.    [provider]   tamsulosin (FLOMAX) 0.4 MG CAPS capsule Take 0.4 mg by mouth at bedtime.     [provider]    Allergies  Allergen Reactions  . Sulfamethoxazole-Trimethoprim Other (See Comments)    dizziness dizziness dizziness   . Tape Rash    blisters    Family History  Problem Relation Age of Onset  . Diabetes Mother   . Cancer Mother   . Hypertension Father   . Hypertension Brother   . Diabetes Brother     Social History Social History   Tobacco Use  . Smoking status: Former Smoker    Quit date: 06/19/1984    Years since quitting: 34.9  . Smokeless tobacco: Never Used  Substance Use Topics  . Alcohol use: No  . Drug use: No    Review of Systems Constitutional: Negative for fever. Cardiovascular: Negative for chest pain. Respiratory: Negative for shortness of breath. Gastrointestinal: Negative for abdominal pain, vomiting and diarrhea. Genitourinary: Frequent urination without dysuria. Musculoskeletal: Negative for musculoskeletal complaints Neurological: Negative for headache All other ROS negative  ____________________________________________   PHYSICAL EXAM:  VITAL SIGNS: ED Triage Vitals  Enc Vitals Group     BP 05/17/19 2201 (!) 168/86     Pulse Rate 05/17/19 2201 80     Resp 05/17/19 2201 17     Temp 05/17/19 2201 98 F (36.7 C)     Temp Source 05/17/19 2201 Oral     SpO2 05/17/19 2158 99 %     Weight 05/17/19 2202 180 lb (81.6 kg)     Height 05/17/19 2202 5\' 6"  (1.676 m)     Head Circumference --      Peak Flow --      Pain Score --      Pain Loc --      Pain Edu? --      Excl. in [place]? --    Constitutional: Alert and oriented. Well appearing and in no distress. Eyes: Normal exam ENT      Head: Normocephalic and atraumatic.      Mouth/Throat: Mucous membranes are moist. Cardiovascular: Normal rate, regular rhythm. Respiratory: Normal respiratory effort without tachypnea nor retractions. Breath sounds are clear  Gastrointestinal: Soft and  nontender. No distention.  Musculoskeletal: Nontender with normal range of motion in all extremities.  Neurologic:  Normal speech and language. No gross focal neurologic deficits  Skin:  Skin is warm, dry and intact.  Psychiatric: Mood and affect are normal.   ____________________________________________   INITIAL IMPRESSION / ASSESSMENT AND PLAN / ED COURSE  Pertinent labs & imaging results that were available during my care of the patient were reviewed by me and considered in my medical decision making (see chart for details).   Patient presents emergency department for frequent urination and elevated blood glucose.  States his glucose monitor has been broken over the past week or so  and he has not been able to check blood glucose at home.  We will check labs including a VBG.  Point-of-care blood glucose is 526.  pH has resulted at 7.39.  CBC/CMP pending.  We will continue with IV hydration while awaiting CMP.  As long as potassium is within normal limits we will dose IV insulin.  Given a normal pH I would anticipate glucose control in the emergency department and likely discharge home.  Patient care signed out to oncoming physician.  Peter Robertson was evaluated in Emergency Department on 05/17/2019 for the symptoms described in the history of present illness. He was evaluated in the context of the global COVID-19 pandemic, which necessitated consideration that the patient might be at risk for infection with the SARS-CoV-2 virus that causes COVID-19. Institutional protocols and algorithms that pertain to the evaluation of patients at risk for COVID-19 are in a state of rapid change based on information released by regulatory bodies including the CDC and federal and state organizations. These policies and algorithms were followed during the patient's care in the ED.  ____________________________________________   FINAL CLINICAL IMPRESSION(S) / ED DIAGNOSES  Hyperglycemia   Harvest Dark, MD 05/17/19 2249

## 2019-05-18 LAB — URINALYSIS, COMPLETE (UACMP) WITH MICROSCOPIC
Bacteria, UA: NONE SEEN
Bilirubin Urine: NEGATIVE
Glucose, UA: >=500 mg/dL — AB
Hgb urine dipstick: NEGATIVE
Ketones, ur: NEGATIVE mg/dL
Leukocytes,Ua: NEGATIVE
Nitrite: NEGATIVE
Protein, ur: NEGATIVE mg/dL
Specific Gravity, Urine: 1.021 (ref 1.005–1.030)
Squamous Epithelial / HPF: NONE SEEN (ref 0–5)
pH: 5 (ref 5.0–8.0)

## 2019-05-18 LAB — GLUCOSE, CAPILLARY: Glucose-Capillary: 339 mg/dL — ABNORMAL HIGH (ref 70–99)

## 2019-05-18 MED ORDER — BLOOD GLUCOSE MONITOR KIT: PACK | 0 refills | Status: DC

## 2019-05-18 NOTE — ED Notes (Signed)
This RN was unable to reach cab company or the patient's contacts for transport home.

## 2019-05-18 NOTE — ED Notes (Signed)
This RN attempted to call patient's stepson for ride home - no answer. This RN was successful at contacting patient's neighbor. She reported that she would attempt to get in touch with his stepson for patient's ride home.

## 2019-05-18 NOTE — ED Provider Notes (Addendum)
_________________________ 12:32 AM on 05/18/2019 -----------------------------------------  After IVF and IV insulin BG has improved significantly. No signs of DKA with no AG and normal VBG.  Patient feels at baseline. Will provide a prescription for a new glucometer. Discussed the importance of regular CBG checking and close follow up with his doctor for monitoring of his type 2 diabetes. Discussed my standard return precautions and close f/u   Alfred Levins, [place], MD 05/18/19 Alen Bleacher, [place], MD 05/18/19 [phone]

## 2019-05-18 NOTE — ED Notes (Signed)
Reviewed discharge instructions, follow-up care, and prescriptions with patient. Patient verbalized understanding of all information reviewed. Patient stable, with no distress noted at this time.    

## 2019-05-18 NOTE — ED Notes (Signed)
This RN was unable to reach [employer] or either of the two patient contacts for transport home. Patient updated.

## 2019-05-18 NOTE — ED Notes (Signed)
This RN unable to reach patient's neighbor/patient contact or stepson telephonically.

## 2019-05-18 NOTE — ED Notes (Signed)
This RN again attempted to call patient's stepson - no answer.

## 2019-05-18 NOTE — ED Notes (Signed)
Spoke with rep from [place]. Transport service on it's way to pickup patient.

## 2019-08-27 ENCOUNTER — Emergency Department: Payer: Medicare PPO

## 2019-08-27 ENCOUNTER — Encounter: Payer: Self-pay | Admitting: Emergency Medicine

## 2019-08-27 ENCOUNTER — Emergency Department
Admission: EM | Admit: 2019-08-27 | Discharge: 2019-08-27 | Disposition: A | Discharge status: Home / Self Care | Payer: Medicare PPO | Attending: Emergency Medicine | Admitting: Emergency Medicine

## 2019-08-27 DIAGNOSIS — W19XXXA Unspecified fall, initial encounter: Secondary | ICD-10-CM

## 2019-08-27 DIAGNOSIS — Z79899 Other long term (current) drug therapy: Secondary | ICD-10-CM | POA: Insufficient documentation

## 2019-08-27 DIAGNOSIS — Z87891 Personal history of nicotine dependence: Secondary | ICD-10-CM | POA: Insufficient documentation

## 2019-08-27 DIAGNOSIS — S51012A Laceration without foreign body of left elbow, initial encounter: Secondary | ICD-10-CM

## 2019-08-27 DIAGNOSIS — Z23 Encounter for immunization: Secondary | ICD-10-CM | POA: Diagnosis not present

## 2019-08-27 DIAGNOSIS — E1122 Type 2 diabetes mellitus with diabetic chronic kidney disease: Secondary | ICD-10-CM | POA: Diagnosis not present

## 2019-08-27 DIAGNOSIS — Z794 Long term (current) use of insulin: Secondary | ICD-10-CM | POA: Insufficient documentation

## 2019-08-27 DIAGNOSIS — I129 Hypertensive chronic kidney disease with stage 1 through stage 4 chronic kidney disease, or unspecified chronic kidney disease: Secondary | ICD-10-CM | POA: Diagnosis not present

## 2019-08-27 DIAGNOSIS — I2510 Atherosclerotic heart disease of native coronary artery without angina pectoris: Secondary | ICD-10-CM | POA: Insufficient documentation

## 2019-08-27 DIAGNOSIS — Y929 Unspecified place or not applicable: Secondary | ICD-10-CM | POA: Diagnosis not present

## 2019-08-27 DIAGNOSIS — S3011XA Contusion of abdominal wall, initial encounter: Secondary | ICD-10-CM

## 2019-08-27 DIAGNOSIS — Z951 Presence of aortocoronary bypass graft: Secondary | ICD-10-CM | POA: Diagnosis not present

## 2019-08-27 DIAGNOSIS — S59902A Unspecified injury of left elbow, initial encounter: Secondary | ICD-10-CM | POA: Diagnosis present

## 2019-08-27 DIAGNOSIS — Y939 Activity, unspecified: Secondary | ICD-10-CM | POA: Diagnosis not present

## 2019-08-27 DIAGNOSIS — J45909 Unspecified asthma, uncomplicated: Secondary | ICD-10-CM | POA: Diagnosis not present

## 2019-08-27 DIAGNOSIS — W108XXA Fall (on) (from) other stairs and steps, initial encounter: Secondary | ICD-10-CM | POA: Insufficient documentation

## 2019-08-27 DIAGNOSIS — R0789 Other chest pain: Secondary | ICD-10-CM

## 2019-08-27 DIAGNOSIS — Y999 Unspecified external cause status: Secondary | ICD-10-CM | POA: Diagnosis not present

## 2019-08-27 DIAGNOSIS — S43402A Unspecified sprain of left shoulder joint, initial encounter: Secondary | ICD-10-CM

## 2019-08-27 DIAGNOSIS — S301XXA Contusion of abdominal wall, initial encounter: Secondary | ICD-10-CM | POA: Insufficient documentation

## 2019-08-27 DIAGNOSIS — N189 Chronic kidney disease, unspecified: Secondary | ICD-10-CM | POA: Insufficient documentation

## 2019-08-27 DIAGNOSIS — I252 Old myocardial infarction: Secondary | ICD-10-CM | POA: Diagnosis not present

## 2019-08-27 DIAGNOSIS — Z7982 Long term (current) use of aspirin: Secondary | ICD-10-CM | POA: Insufficient documentation

## 2019-08-27 LAB — BASIC METABOLIC PANEL
Anion gap: 7 (ref 5–15)
BUN: 47 mg/dL — ABNORMAL HIGH (ref 8–23)
CO2: 21 mmol/L — ABNORMAL LOW (ref 22–32)
Calcium: 8.9 mg/dL (ref 8.9–10.3)
Chloride: 109 mmol/L (ref 98–111)
Creatinine, Ser: 1.5 mg/dL — ABNORMAL HIGH (ref 0.61–1.24)
GFR calc Af Amer: 51 mL/min — ABNORMAL LOW (ref >60)
GFR calc non Af Amer: 44 mL/min — ABNORMAL LOW (ref >60)
Glucose, Bld: 261 mg/dL — ABNORMAL HIGH (ref 70–99)
Potassium: 4.3 mmol/L (ref 3.5–5.1)
Sodium: 137 mmol/L (ref 135–145)

## 2019-08-27 LAB — CBC
HCT: 35.7 % — ABNORMAL LOW (ref 39.0–52.0)
Hemoglobin: 12.2 g/dL — ABNORMAL LOW (ref 13.0–17.0)
MCH: 28.1 pg (ref 26.0–34.0)
MCHC: 34.2 g/dL (ref 30.0–36.0)
MCV: 82.3 fL (ref 80.0–100.0)
Platelets: 120 10*3/uL — ABNORMAL LOW (ref 150–400)
RBC: 4.34 MIL/uL (ref 4.22–5.81)
RDW: 15 % (ref 11.5–15.5)
WBC: 7.8 10*3/uL (ref 4.0–10.5)
nRBC: 0 % (ref 0.0–0.2)

## 2019-08-27 LAB — TROPONIN I (HIGH SENSITIVITY): Troponin I (High Sensitivity): 8 ng/L (ref <18)

## 2019-08-27 MED ORDER — TETANUS-DIPHTH-ACELL PERTUSSIS 5-2.5-18.5 LF-MCG/0.5 IM SUSP
0.5000 mL | Freq: Once | INTRAMUSCULAR | Status: AC
  Administered 2019-08-27: 0.5 mL via INTRAMUSCULAR
  Filled 2019-08-27: qty 0.5

## 2019-08-27 MED ORDER — IOHEXOL 300 MG/ML  SOLN
75.0000 mL | Freq: Once | INTRAMUSCULAR | Status: AC | PRN
  Administered 2019-08-27: 75 mL via INTRAVENOUS

## 2019-08-27 NOTE — ED Notes (Signed)
Pts bilateral skin tears to the elbows have been dressed. Dried blood removed from skin around. Discharge paperwork went over with Lorinda Creed, caregiver and neighbor. Pt lives at home by self and has caregiver come in as needed and for medication administration. Per Mrs. Welch on discharge, she does not feel that it is right to not have her back in room due to pts dementia. PMrs. Welch informed that pt was able to answer questions when asked and lives at home alone.

## 2019-08-27 NOTE — ED Triage Notes (Signed)
Pt arrived via EMS from home post fall with walker. EMS reports pt was trying to come down steps with walker when he fell. Pt c/o chest and left shoulder pain. Obvious skin tear to the left elbow with bruising. Pt A&O x4.

## 2019-08-27 NOTE — ED Provider Notes (Signed)
  [facility] Emergency Department Provider Note   ____________________________________________    I have reviewed the triage vital signs and the nursing notes.   HISTORY  Chief Complaint Fall and Chest Pain     HPI Peter Robertson is a 79 y.o. male with past medical history as noted below who presents after a fall.  Patient reports that he was heading into the backyard with his walker and lost his balance and fell down 2 stairs.  He complains of pain to his left shoulder, left elbow and left upper abdomen.  He denies head injury.  No neck pain.  No back pain.  No lower extremity injuries.  He is not sure what his medications are but he denies being on blood thinners.  No nausea or vomiting.   Past Medical History:  Diagnosis Date  . Asthma   . BPH (benign prostatic hyperplasia)   . Chronic kidney disease    Kidney Stones  . Coronary artery disease   . Diabetes mellitus without complication ([facility])   . Heart murmur   . Hyperlipidemia   . Hypertension   . Myocardial infarction ([facility])     Patient Active Problem List   Diagnosis Date Noted  . Unstable angina ([facility]) 05/20/2018  . Chest pain 01/30/2017    Past Surgical History:  Procedure Laterality Date  . COLONOSCOPY WITH PROPOFOL N/A 12/03/2014   Procedure: COLONOSCOPY WITH PROPOFOL;  Surgeon:  T Elliott, MD;  Location: [facility] ENDOSCOPY;  Service: Endoscopy;  Laterality: N/A;  . CORONARY ARTERY BYPASS GRAFT    . LEFT HEART CATH AND CORS/GRAFTS ANGIOGRAPHY N/A 05/22/2018   Procedure: LEFT HEART CATH AND CORS/GRAFTS ANGIOGRAPHY poss PCI;  Surgeon: Gollan, Timothy J, MD;  Location: [facility] INVASIVE CV LAB;  Service: Cardiovascular;  Laterality: N/A;    Prior to Admission medications   Medication Sig Start Date End Date Taking? Authorizing Provider  aspirin EC 81 MG tablet Take 81 mg by mouth at bedtime.     [provider]  atorvastatin (LIPITOR) 80 MG tablet Take 80 mg by mouth at bedtime.     [provider]  blood glucose meter kit and supplies KIT Dispense based on patient and insurance preference. Use up to four times daily as directed. (FOR ICD-9 250.00, 250.01). 05/18/19   Veronese, [place], MD  cholecalciferol (VITAMIN D3) 25 MCG (1000 UT) tablet Take 1,000 Units by mouth 2 (two) times daily.    [provider]  furosemide (LASIX) 20 MG tablet Take 1 tablet (20 mg total) by mouth daily. do not take the Lasix every day take 1 pill every other day. Patient taking differently: Take 20 mg by mouth every other day.  06/29/17 06/29/18  Malinda, Paul F, MD  insulin glargine (LANTUS) 100 UNIT/ML injection Inject 24 Units into the skin at bedtime.     [provider]  isosorbide mononitrate (IMDUR) 60 MG 24 hr tablet Take 60 mg by mouth daily. 07/03/18   [provider]  ketorolac (ACULAR) 0.5 % ophthalmic solution Place 1 drop into the right eye 4 (four) times daily. 08/14/18   [provider]  lisinopril (PRINIVIL,ZESTRIL) 5 MG tablet Take 5 mg by mouth at bedtime.     [provider]  metFORMIN (GLUCOPHAGE) 1000 MG tablet Take 1 tablet (1,000 mg total) by mouth 2 (two) times daily with a meal. Hold it for 48 hrs due to cath/dye load and resume it on 05/24/2018 05/22/18   Shah, Vipul, MD    metoprolol succinate (TOPROL-XL) 50 MG 24 hr tablet Take 50 mg by mouth at bedtime.    [provider]  moxifloxacin (VIGAMOX) 0.5 % ophthalmic solution Place 1 drop into the right eye 4 (four) times daily. 08/14/18   [provider]  nitroGLYCERIN (NITROSTAT) 0.4 MG SL tablet Place 0.4 mg under the tongue every 5 (five) minutes as needed for chest pain.     [provider]  prednisoLONE acetate (PRED FORTE) 1 % ophthalmic suspension Place 1 drop into the right eye 4 (four) times daily. 08/14/18   [provider]  sertraline (ZOLOFT) 100 MG tablet Take 150 mg by mouth at bedtime.    [provider]  tamsulosin  (FLOMAX) 0.4 MG CAPS capsule Take 0.4 mg by mouth at bedtime.     [provider]     Allergies Sulfamethoxazole-trimethoprim and Tape  Family History  Problem Relation Age of Onset  . Diabetes Mother   . Cancer Mother   . Hypertension Father   . Hypertension Brother   . Diabetes Brother     Social History Social History   Tobacco Use  . Smoking status: Former Smoker    Quit date: 06/19/1984    Years since quitting: 35.2  . Smokeless tobacco: Never Used  Substance Use Topics  . Alcohol use: No  . Drug use: No    Review of Systems  Constitutional: No dizziness Eyes: No visual changes.  ENT: No neck pain Cardiovascular: Left lower chest anterior pain Respiratory: Denies shortness of breath. Gastrointestinal: As above Genitourinary: No groin injury Musculoskeletal: As above Skin: Negative for rash. Neurological: Negative for headaches, no focal weakness   ____________________________________________   PHYSICAL EXAM:  VITAL SIGNS: ED Triage Vitals [08/27/19 1913]  Enc Vitals Group     BP (!) 174/82     Pulse Rate 81     Resp 17     Temp 99.1 F (37.3 C)     Temp Source Oral     SpO2 98 %     Weight      Height      Head Circumference      Peak Flow      Pain Score      Pain Loc      Pain Edu?      Excl. in [place]?     Constitutional: Alert and oriented.  Head: Atraumatic. Nose: No congestion/rhinnorhea. Mouth/Throat: Mucous membranes are moist.   Neck:  Painless ROM, no vertebral tenderness, no pain with axial load Cardiovascular: Normal rate, regular rhythm.  Good peripheral circulation.  Mild tenderness palpation left lower anterior chest Respiratory: Normal respiratory effort.  No retractions.  Gastrointestinal: Soft, mild tenderness palpation left upper quadrant, no distention, no bruising  Musculoskeletal: Normal range of motion of all extremities, complains of some pain of the left shoulder but is ranging his arm normally.  Suffered  skin tear to the left elbow.  No pain with axial load on both hips, Neurologic:  Normal speech and language. No gross focal neurologic deficits are appreciated.  Skin:  Skin is warm, dry, as above Psychiatric: Mood and affect are normal. Speech and behavior are normal.  ____________________________________________   LABS (all labs ordered are listed, but only abnormal results are displayed)  Labs Reviewed  BASIC METABOLIC PANEL - Abnormal; Notable for the following components:      Result Value   CO2 21 (*)    Glucose, Bld 261 (*)    BUN 47 (*)      Creatinine, Ser 1.50 (*)    GFR calc non Af Amer 44 (*)    GFR calc Af Amer 51 (*)    All other components within normal limits  CBC - Abnormal; Notable for the following components:   Hemoglobin 12.2 (*)    HCT 35.7 (*)    Platelets 120 (*)    All other components within normal limits  TROPONIN I (HIGH SENSITIVITY)   ____________________________________________  EKG  ED ECG REPORT I,  , the attending physician, personally viewed and interpreted this ECG.  Date: 08/27/2019  Rhythm: normal sinus rhythm QRS Axis: normal Intervals: Abnormal ST/T Wave abnormalities: Nonspecific changes Narrative Interpretation: no evidence of acute ischemia  ____________________________________________  RADIOLOGY  CT chest abdomen pelvis ____________________________________________   PROCEDURES  Procedure(s) performed: No  Procedures   Critical Care performed: No ____________________________________________   INITIAL IMPRESSION / ASSESSMENT AND PLAN / ED COURSE  Pertinent labs & imaging results that were available during my care of the patient were reviewed by me and considered in my medical decision making (see chart for details).  Patient presents after a fall down 2 steps, thinks he landed on his walker, complaining of left upper quadrant abdominal pain and left lower anterior chest pain.  Also has some discomfort  with range of motion of the left shoulder.  Will obtain x-rays of the left shoulder, CT chest abdomen pelvis for blunt injury.  CT scan is reassuring, no acute injuries or fractures.  Lab work demonstrates dehydration, encourage fluid intake.  Tetanus updated, skin tear cleaned and dressed.  Appropriate for discharge at this time with outpatient follow-up.    ____________________________________________   FINAL CLINICAL IMPRESSION(S) / ED DIAGNOSES  Final diagnoses:  Contusion of abdominal wall, initial encounter  Fall, initial encounter  Skin tear of left elbow without complication, initial encounter  Sprain of left shoulder, unspecified shoulder sprain type, initial encounter  Chest wall pain        Note:  This document was prepared using Dragon voice recognition software and may include unintentional dictation errors.   , , MD 08/27/19 2224  

## 2019-10-10 ENCOUNTER — Inpatient Hospital Stay
Admission: EM | Admit: 2019-10-10 | Discharge: 2019-10-15 | DRG: 149 | Disposition: A | Discharge status: Skilled Nursing Facility | Payer: Medicare PPO | Attending: Family Medicine | Admitting: Family Medicine

## 2019-10-10 ENCOUNTER — Encounter: Payer: Self-pay | Admitting: Emergency Medicine

## 2019-10-10 ENCOUNTER — Other Ambulatory Visit: Payer: Self-pay

## 2019-10-10 ENCOUNTER — Emergency Department: Payer: Medicare PPO

## 2019-10-10 DIAGNOSIS — R42 Dizziness and giddiness: Secondary | ICD-10-CM

## 2019-10-10 DIAGNOSIS — Z20822 Contact with and (suspected) exposure to covid-19: Secondary | ICD-10-CM | POA: Diagnosis present

## 2019-10-10 DIAGNOSIS — N400 Enlarged prostate without lower urinary tract symptoms: Secondary | ICD-10-CM

## 2019-10-10 DIAGNOSIS — D696 Thrombocytopenia, unspecified: Secondary | ICD-10-CM

## 2019-10-10 DIAGNOSIS — Z87891 Personal history of nicotine dependence: Secondary | ICD-10-CM

## 2019-10-10 DIAGNOSIS — R197 Diarrhea, unspecified: Secondary | ICD-10-CM | POA: Diagnosis present

## 2019-10-10 DIAGNOSIS — E119 Type 2 diabetes mellitus without complications: Secondary | ICD-10-CM

## 2019-10-10 DIAGNOSIS — D860 Sarcoidosis of lung: Secondary | ICD-10-CM | POA: Diagnosis present

## 2019-10-10 DIAGNOSIS — R262 Difficulty in walking, not elsewhere classified: Secondary | ICD-10-CM

## 2019-10-10 DIAGNOSIS — I252 Old myocardial infarction: Secondary | ICD-10-CM

## 2019-10-10 DIAGNOSIS — I129 Hypertensive chronic kidney disease with stage 1 through stage 4 chronic kidney disease, or unspecified chronic kidney disease: Secondary | ICD-10-CM | POA: Diagnosis present

## 2019-10-10 DIAGNOSIS — E860 Dehydration: Secondary | ICD-10-CM | POA: Diagnosis present

## 2019-10-10 DIAGNOSIS — R296 Repeated falls: Secondary | ICD-10-CM | POA: Diagnosis present

## 2019-10-10 DIAGNOSIS — I2510 Atherosclerotic heart disease of native coronary artery without angina pectoris: Secondary | ICD-10-CM

## 2019-10-10 DIAGNOSIS — Z9181 History of falling: Secondary | ICD-10-CM

## 2019-10-10 DIAGNOSIS — Z794 Long term (current) use of insulin: Secondary | ICD-10-CM

## 2019-10-10 DIAGNOSIS — Z833 Family history of diabetes mellitus: Secondary | ICD-10-CM

## 2019-10-10 DIAGNOSIS — E1165 Type 2 diabetes mellitus with hyperglycemia: Secondary | ICD-10-CM

## 2019-10-10 DIAGNOSIS — Z8249 Family history of ischemic heart disease and other diseases of the circulatory system: Secondary | ICD-10-CM

## 2019-10-10 DIAGNOSIS — H8110 Benign paroxysmal vertigo, unspecified ear: Principal | ICD-10-CM

## 2019-10-10 DIAGNOSIS — E1129 Type 2 diabetes mellitus with other diabetic kidney complication: Secondary | ICD-10-CM

## 2019-10-10 DIAGNOSIS — Z7902 Long term (current) use of antithrombotics/antiplatelets: Secondary | ICD-10-CM

## 2019-10-10 DIAGNOSIS — R269 Unspecified abnormalities of gait and mobility: Secondary | ICD-10-CM | POA: Diagnosis present

## 2019-10-10 DIAGNOSIS — Z79899 Other long term (current) drug therapy: Secondary | ICD-10-CM

## 2019-10-10 DIAGNOSIS — Z7982 Long term (current) use of aspirin: Secondary | ICD-10-CM

## 2019-10-10 DIAGNOSIS — Z951 Presence of aortocoronary bypass graft: Secondary | ICD-10-CM

## 2019-10-10 DIAGNOSIS — E785 Hyperlipidemia, unspecified: Secondary | ICD-10-CM | POA: Diagnosis present

## 2019-10-10 DIAGNOSIS — N182 Chronic kidney disease, stage 2 (mild): Secondary | ICD-10-CM | POA: Diagnosis present

## 2019-10-10 DIAGNOSIS — Z882 Allergy status to sulfonamides status: Secondary | ICD-10-CM

## 2019-10-10 DIAGNOSIS — R195 Other fecal abnormalities: Secondary | ICD-10-CM

## 2019-10-10 DIAGNOSIS — J45909 Unspecified asthma, uncomplicated: Secondary | ICD-10-CM | POA: Diagnosis present

## 2019-10-10 DIAGNOSIS — E1122 Type 2 diabetes mellitus with diabetic chronic kidney disease: Secondary | ICD-10-CM | POA: Diagnosis present

## 2019-10-10 LAB — URINALYSIS, COMPLETE (UACMP) WITH MICROSCOPIC
Bacteria, UA: NONE SEEN
Bilirubin Urine: NEGATIVE
Glucose, UA: 50 mg/dL — AB
Hgb urine dipstick: NEGATIVE
Ketones, ur: NEGATIVE mg/dL
Leukocytes,Ua: NEGATIVE
Nitrite: NEGATIVE
Protein, ur: 100 mg/dL — AB
Specific Gravity, Urine: 1.011 (ref 1.005–1.030)
Squamous Epithelial / HPF: NONE SEEN (ref 0–5)
pH: 6 (ref 5.0–8.0)

## 2019-10-10 LAB — CBC WITH DIFFERENTIAL/PLATELET
Abs Immature Granulocytes: 0.03 10*3/uL (ref 0.00–0.07)
Basophils Absolute: 0 10*3/uL (ref 0.0–0.1)
Basophils Relative: 0 %
Eosinophils Absolute: 0 10*3/uL (ref 0.0–0.5)
Eosinophils Relative: 1 %
HCT: 34.4 % — ABNORMAL LOW (ref 39.0–52.0)
Hemoglobin: 11.6 g/dL — ABNORMAL LOW (ref 13.0–17.0)
Immature Granulocytes: 0 %
Lymphocytes Relative: 6 %
Lymphs Abs: 0.5 10*3/uL — ABNORMAL LOW (ref 0.7–4.0)
MCH: 28 pg (ref 26.0–34.0)
MCHC: 33.7 g/dL (ref 30.0–36.0)
MCV: 82.9 fL (ref 80.0–100.0)
Monocytes Absolute: 0.7 10*3/uL (ref 0.1–1.0)
Monocytes Relative: 10 %
Neutro Abs: 6.5 10*3/uL (ref 1.7–7.7)
Neutrophils Relative %: 83 %
Platelets: 99 10*3/uL — ABNORMAL LOW (ref 150–400)
RBC: 4.15 MIL/uL — ABNORMAL LOW (ref 4.22–5.81)
RDW: 15.3 % (ref 11.5–15.5)
WBC: 7.8 10*3/uL (ref 4.0–10.5)
nRBC: 0 % (ref 0.0–0.2)

## 2019-10-10 LAB — COMPREHENSIVE METABOLIC PANEL
ALT: 13 U/L (ref 0–44)
AST: 15 U/L (ref 15–41)
Albumin: 4.3 g/dL (ref 3.5–5.0)
Alkaline Phosphatase: 95 U/L (ref 38–126)
Anion gap: 8 (ref 5–15)
BUN: 32 mg/dL — ABNORMAL HIGH (ref 8–23)
CO2: 25 mmol/L (ref 22–32)
Calcium: 9.1 mg/dL (ref 8.9–10.3)
Chloride: 105 mmol/L (ref 98–111)
Creatinine, Ser: 1.22 mg/dL (ref 0.61–1.24)
GFR calc Af Amer: >60 mL/min (ref >60)
GFR calc non Af Amer: 56 mL/min — ABNORMAL LOW (ref >60)
Glucose, Bld: 201 mg/dL — ABNORMAL HIGH (ref 70–99)
Potassium: 4.8 mmol/L (ref 3.5–5.1)
Sodium: 138 mmol/L (ref 135–145)
Total Bilirubin: 1 mg/dL (ref 0.3–1.2)
Total Protein: 7.5 g/dL (ref 6.5–8.1)

## 2019-10-10 LAB — GLUCOSE, CAPILLARY: Glucose-Capillary: 141 mg/dL — ABNORMAL HIGH (ref 70–99)

## 2019-10-10 LAB — LIPASE, BLOOD: Lipase: 27 U/L (ref 11–51)

## 2019-10-10 LAB — LACTIC ACID, PLASMA: Lactic Acid, Venous: 0.9 mmol/L (ref 0.5–1.9)

## 2019-10-10 MED ORDER — MECLIZINE HCL 25 MG PO TABS
12.5000 mg | ORAL_TABLET | Freq: Once | ORAL | Status: AC
  Administered 2019-10-10: 12.5 mg via ORAL
  Filled 2019-10-10: qty 1

## 2019-10-10 MED ORDER — INSULIN ASPART 100 UNIT/ML ~~LOC~~ SOLN
0.0000 [IU] | Freq: Three times a day (TID) | SUBCUTANEOUS | Status: DC
  Administered 2019-10-11 (×2): 3 [IU] via SUBCUTANEOUS
  Administered 2019-10-12: 18:00:00 2 [IU] via SUBCUTANEOUS
  Administered 2019-10-12 – 2019-10-13 (×2): 15 [IU] via SUBCUTANEOUS
  Administered 2019-10-13: 2 [IU] via SUBCUTANEOUS
  Administered 2019-10-14 – 2019-10-15 (×4): 3 [IU] via SUBCUTANEOUS
  Administered 2019-10-15: 5 [IU] via SUBCUTANEOUS
  Filled 2019-10-10 (×11): qty 1

## 2019-10-10 MED ORDER — TAMSULOSIN HCL 0.4 MG PO CAPS
0.4000 mg | ORAL_CAPSULE | Freq: Every day | ORAL | Status: DC
  Administered 2019-10-10 – 2019-10-14 (×5): 0.4 mg via ORAL
  Filled 2019-10-10 (×5): qty 1

## 2019-10-10 MED ORDER — SODIUM CHLORIDE 0.9 % IV BOLUS: 1000.0000 mL | Freq: Once | INTRAVENOUS | Status: DC

## 2019-10-10 MED ORDER — SODIUM CHLORIDE 0.9 % IV BOLUS
500.0000 mL | Freq: Once | INTRAVENOUS | Status: AC
  Administered 2019-10-10: 500 mL via INTRAVENOUS

## 2019-10-10 MED ORDER — ONDANSETRON HCL 4 MG/2ML IJ SOLN
4.0000 mg | Freq: Four times a day (QID) | INTRAMUSCULAR | Status: DC | PRN
  Administered 2019-10-10 – 2019-10-12 (×3): 4 mg via INTRAVENOUS
  Filled 2019-10-10 (×3): qty 2

## 2019-10-10 MED ORDER — INSULIN GLARGINE 100 UNIT/ML ~~LOC~~ SOLN
24.0000 [IU] | Freq: Every day | SUBCUTANEOUS | Status: DC
  Administered 2019-10-10 – 2019-10-14 (×5): 24 [IU] via SUBCUTANEOUS
  Filled 2019-10-10 (×6): qty 0.24

## 2019-10-10 MED ORDER — PANTOPRAZOLE SODIUM 40 MG PO TBEC
40.0000 mg | DELAYED_RELEASE_TABLET | Freq: Every day | ORAL | Status: DC
  Administered 2019-10-11 – 2019-10-15 (×5): 40 mg via ORAL
  Filled 2019-10-10 (×5): qty 1

## 2019-10-10 MED ORDER — ATORVASTATIN CALCIUM 20 MG PO TABS
80.0000 mg | ORAL_TABLET | Freq: Every day | ORAL | Status: DC
  Administered 2019-10-10 – 2019-10-14 (×5): 80 mg via ORAL
  Filled 2019-10-10 (×6): qty 4

## 2019-10-10 MED ORDER — METOPROLOL SUCCINATE ER 50 MG PO TB24
150.0000 mg | ORAL_TABLET | Freq: Every day | ORAL | Status: DC
  Administered 2019-10-10 – 2019-10-14 (×5): 150 mg via ORAL
  Filled 2019-10-10 (×5): qty 1

## 2019-10-10 MED ORDER — SODIUM CHLORIDE 0.9% FLUSH
3.0000 mL | Freq: Two times a day (BID) | INTRAVENOUS | Status: DC
  Administered 2019-10-10 – 2019-10-15 (×9): 3 mL via INTRAVENOUS

## 2019-10-10 MED ORDER — SERTRALINE HCL 50 MG PO TABS
150.0000 mg | ORAL_TABLET | Freq: Every day | ORAL | Status: DC
  Administered 2019-10-10 – 2019-10-14 (×5): 150 mg via ORAL
  Filled 2019-10-10 (×6): qty 3

## 2019-10-10 MED ORDER — MECLIZINE HCL 25 MG PO TABS
50.0000 mg | ORAL_TABLET | Freq: Two times a day (BID) | ORAL | Status: DC
  Administered 2019-10-11 – 2019-10-15 (×9): 50 mg via ORAL
  Filled 2019-10-10 (×11): qty 2

## 2019-10-10 MED ORDER — LORAZEPAM 2 MG/ML IJ SOLN
0.5000 mg | Freq: Once | INTRAMUSCULAR | Status: AC
  Administered 2019-10-10: 0.5 mg via INTRAVENOUS
  Filled 2019-10-10: qty 1

## 2019-10-10 MED ORDER — SODIUM CHLORIDE 0.9 % IV SOLN: 250.0000 mL | INTRAVENOUS | Status: DC | PRN

## 2019-10-10 MED ORDER — FUROSEMIDE 20 MG PO TABS
20.0000 mg | ORAL_TABLET | ORAL | Status: DC
  Administered 2019-10-11 – 2019-10-15 (×3): 20 mg via ORAL
  Filled 2019-10-10 (×4): qty 1

## 2019-10-10 MED ORDER — ISOSORBIDE MONONITRATE ER 30 MG PO TB24
60.0000 mg | ORAL_TABLET | Freq: Every day | ORAL | Status: DC
  Administered 2019-10-11 – 2019-10-15 (×4): 60 mg via ORAL
  Filled 2019-10-10 (×5): qty 2

## 2019-10-10 MED ORDER — SODIUM CHLORIDE 0.9% FLUSH: 3.0000 mL | INTRAVENOUS | Status: DC | PRN

## 2019-10-10 NOTE — ED Notes (Signed)
Assisted pt to toilet, pt very unsteady on feet. Per pt, normally walks fine at home.

## 2019-10-10 NOTE — ED Provider Notes (Addendum)
[facility] Emergency Department Provider Note  ____________________________________________   First MD Initiated Contact with Patient 10/10/19 1352     (approximate)  I have reviewed the triage vital signs and the nursing notes.   HISTORY  Chief Complaint Fall, Orthostatic, and Diarrhea    HPI Peter Robertson is a 79 y.o. male with past medical history of CAD, diabetes, hypertension, hyperlipidemia, here with dizziness.  Patient states that he has had fairly persistent dizziness for the last several months.  He has had his PCP evaluate this and was told at one point that he had "crystals" in his ear, though he does not believe he had an MRI or further imaging.  He states that over the last several days, this is significantly worsened and he has fallen twice in the last 24 hours.  He is not believe he hit his head and has no musculoskeletal pain related to the falls.  However, he is having worsening dizziness and essentially is unable to walk.  He states that every time he tries to walk, he feels very dizzy like the room is spinning.  He feels nauseous.  He feels off balance.  Denies any difficulty speaking or swallowing.  No focal numbness or weakness.  This is beginning to interfere with his ability to get around his house and he lives alone, so he presents for further evaluation.        Past Medical History:  Diagnosis Date  . Asthma   . BPH (benign prostatic hyperplasia)   . Chronic kidney disease    Kidney Stones  . Coronary artery disease   . Diabetes mellitus without complication ([place])   . Heart murmur   . Hyperlipidemia   . Hypertension   . Myocardial infarction [facility])     Patient Active Problem List   Diagnosis Date Noted  . BPPV (benign paroxysmal positional vertigo) 10/10/2019  . CAD (coronary artery disease) 10/10/2019  . Diabetes mellitus ([place]) 10/10/2019  . BPH (benign prostatic hyperplasia) 10/10/2019  . Thrombocytopenia ([place])  10/10/2019  . Dark stools 10/10/2019  . Unstable angina ([place]) 05/20/2018  . Chest pain 01/30/2017    Past Surgical History:  Procedure Laterality Date  . COLONOSCOPY WITH PROPOFOL N/A 12/03/2014   Procedure: COLONOSCOPY WITH PROPOFOL;  Surgeon: Manya Silvas, MD;  Location: [facility] ENDOSCOPY;  Service: Endoscopy;  Laterality: N/A;  . CORONARY ARTERY BYPASS GRAFT    . LEFT HEART CATH AND CORS/GRAFTS ANGIOGRAPHY N/A 05/22/2018   Procedure: LEFT HEART CATH AND CORS/GRAFTS ANGIOGRAPHY poss PCI;  Surgeon: Minna Merritts, MD;  Location: [place] CV LAB;  Service: Cardiovascular;  Laterality: N/A;    Prior to Admission medications   Medication Sig Start Date End Date Taking? Authorizing Provider  insulin regular (NOVOLIN R) 100 units/mL injection Inject into the skin. 05/26/19 05/25/20 Yes [provider]  pantoprazole (PROTONIX) 40 MG tablet Take by mouth. 01/03/16  Yes [provider]  Sodium Phosphates (ENEMA) 7-19 GM/118ML ENEM Place rectally. 09/09/19  Yes [provider]  aspirin EC 81 MG tablet Take 81 mg by mouth at bedtime.     [provider]  atorvastatin (LIPITOR) 80 MG tablet Take 80 mg by mouth at bedtime.    [provider]  blood glucose meter kit and supplies KIT Dispense based on patient and insurance preference. Use up to four times daily as directed. (FOR ICD-9 250.00, 250.01). 05/18/19   Rudene Re, MD  cholecalciferol (VITAMIN D3) 25 MCG (1000 UT)  tablet Take 1,000 Units by mouth 2 (two) times daily.    [provider]  clopidogrel (PLAVIX) 75 MG tablet Take 75 mg by mouth daily. 08/15/19   [provider]  furosemide (LASIX) 20 MG tablet Take 1 tablet (20 mg total) by mouth daily. do not take the Lasix every day take 1 pill every other day. Patient taking differently: Take 20 mg by mouth every other day.  06/29/17 06/29/18  Nena Polio, MD  insulin glargine (LANTUS) 100 UNIT/ML injection Inject 24  Units into the skin at bedtime.     [provider]  isosorbide mononitrate (IMDUR) 60 MG 24 hr tablet Take 60 mg by mouth daily. 07/03/18   [provider]  ketorolac (ACULAR) 0.5 % ophthalmic solution Place 1 drop into the right eye 4 (four) times daily. 08/14/18   [provider]  Melatonin 10 MG CAPS Take by mouth.    [provider]  metFORMIN (GLUCOPHAGE) 1000 MG tablet Take 1 tablet (1,000 mg total) by mouth 2 (two) times daily with a meal. Hold it for 48 hrs due to cath/dye load and resume it on 05/24/2018 05/22/18   Max Sane, MD  metFORMIN (GLUCOPHAGE) 500 MG tablet Take 1,000 mg by mouth 2 (two) times daily. 09/22/19   [provider]  metoprolol succinate (TOPROL-XL) 50 MG 24 hr tablet Take 50 mg by mouth at bedtime.    [provider]  moxifloxacin (VIGAMOX) 0.5 % ophthalmic solution Place 1 drop into the right eye 4 (four) times daily. 08/14/18   [provider]  Multiple Vitamins-Minerals (PRESERVISION AREDS 2+MULTI VIT PO) Take by mouth.    [provider]  nitroGLYCERIN (NITROSTAT) 0.4 MG SL tablet Place 0.4 mg under the tongue every 5 (five) minutes as needed for chest pain.     [provider]  omeprazole (PRILOSEC) 40 MG capsule Take 40 mg by mouth 2 (two) times daily. 07/19/19   [provider]  prednisoLONE acetate (PRED FORTE) 1 % ophthalmic suspension Place 1 drop into the right eye 4 (four) times daily. 08/14/18   [provider]  sertraline (ZOLOFT) 100 MG tablet Take 150 mg by mouth at bedtime.    [provider]  tamsulosin (FLOMAX) 0.4 MG CAPS capsule Take 0.4 mg by mouth at bedtime.     [provider]    Allergies Sulfamethoxazole-trimethoprim and Tape  Family History  Problem Relation Age of Onset  . Diabetes Mother   . Cancer Mother   . Hypertension Father   . Hypertension Brother   . Diabetes Brother     Social History Social History   Tobacco  Use  . Smoking status: Former Smoker    Quit date: 06/19/1984    Years since quitting: 35.3  . Smokeless tobacco: Never Used  Substance Use Topics  . Alcohol use: No  . Drug use: No    Review of Systems  Review of Systems  Constitutional: Positive for fatigue. Negative for chills and fever.  HENT: Negative for sore throat.   Respiratory: Negative for shortness of breath.   Cardiovascular: Negative for chest pain.  Gastrointestinal: Negative for abdominal pain.  Genitourinary: Negative for flank pain.  Musculoskeletal: Positive for gait problem. Negative for neck pain.  Skin: Negative for rash and wound.  Allergic/Immunologic: Negative for immunocompromised state.  Neurological: Positive for dizziness. Negative for weakness and numbness.  Hematological: Does not bruise/bleed easily.  All other systems reviewed and are negative.    ____________________________________________  PHYSICAL EXAM:      VITAL SIGNS: ED Triage Vitals  Enc Vitals Group     BP 10/10/19 1353 (!) 146/74     Pulse Rate 10/10/19 1353 86     Resp 10/10/19 1353 17     Temp 10/10/19 1353 98.2 F (36.8 C)     Temp Source 10/10/19 1353 Oral     SpO2 10/10/19 1353 100 %     Weight 10/10/19 1351 180 lb (81.6 kg)     Height 10/10/19 1351 _0  (1.676 m)     Head Circumference --      Peak Flow --      Pain Score 10/10/19 1351 0     Pain Loc --      Pain Edu? --      Excl. in [place]? --      Physical Exam Vitals and nursing note reviewed.  Constitutional:      General: He is not in acute distress.    Appearance: He is well-developed.  HENT:     Head: Normocephalic and atraumatic.  Eyes:     Conjunctiva/sclera: Conjunctivae normal.     Comments: Slight anisocoria with L pupil 4 mm, R pupil 3 mm. Both reactive.  Cardiovascular:     Rate and Rhythm: Normal rate and regular rhythm.     Heart sounds: Normal heart sounds.  Pulmonary:     Effort: Pulmonary effort is normal. No respiratory distress.      Breath sounds: No wheezing.  Abdominal:     General: There is no distension.  Musculoskeletal:     Cervical back: Neck supple.  Skin:    General: Skin is warm.     Capillary Refill: Capillary refill takes less than 2 seconds.     Findings: No rash.  Neurological:     Mental Status: He is alert.     Motor: No abnormal muscle tone.     Comments: Neurological Exam:  Mental Status: Alert and oriented to person, place, and time. Attention and concentration normal. Speech clear. Recent memory is intact. Cranial Nerves: Visual fields grossly intact. EOMI and PERRLA. No nystagmus noted. Facial sensation intact at forehead, maxillary cheek, and chin/mandible bilaterally. No facial asymmetry or weakness. Hearing grossly normal. Uvula is midline, and palate elevates symmetrically. Normal SCM and trapezius strength. Tongue midline without fasciculations. Motor: Muscle strength 5/5 in proximal and distal UE and LE bilaterally. No pronator drift. Muscle tone normal. Sensation: Intact to light touch in upper and lower extremities distally bilaterally.  Gait: UTA Coordination: Gross dysmetria on bilateral FTN          ____________________________________________   LABS (all labs ordered are listed, but only abnormal results are displayed)  Labs Reviewed  CBC WITH DIFFERENTIAL/PLATELET - Abnormal; Notable for the following components:      Result Value   RBC 4.15 (*)    Hemoglobin 11.6 (*)    HCT 34.4 (*)    Platelets 99 (*)    Lymphs Abs 0.5 (*)    All other components within normal limits  COMPREHENSIVE METABOLIC PANEL - Abnormal; Notable for the following components:   Glucose, Bld 201 (*)    BUN 32 (*)    GFR calc non Af Amer 56 (*)    All other components within normal limits  URINALYSIS, COMPLETE (UACMP) WITH MICROSCOPIC - Abnormal; Notable for the following components:   Color, Urine STRAW (*)    APPearance CLEAR (*)    Glucose, UA 50 (*)  Protein, ur 100 (*)    All other  components within normal limits  SARS CORONAVIRUS 2 (TAT 6-24 HRS)  LACTIC ACID, PLASMA  LIPASE, BLOOD    ____________________________________________  EKG: Sinus rhythm with frequent PACs.  Heart rate 78, PR 170, QRS 129, QTc 440.  P axis 25, QRS 122.  No acute ST elevations or depressions.  Right bundle branch block is noted. ________________________________________  RADIOLOGY All imaging, including plain films, CT scans, and ultrasounds, independently reviewed by me, and interpretations confirmed via formal radiology reads.  ED MD interpretation:   MR Brain: No acute infarct, mild vetriculomegaly  Official radiology report(s): MR BRAIN WO CONTRAST  Result Date: 10/10/2019 CLINICAL DATA:  Vertigo, central. Additional history provided: Patient presents via EMS with fall, orthostatic upon arrival, diarrhea for 2 days. EXAM: MRI HEAD WITHOUT CONTRAST TECHNIQUE: Multiplanar, multiecho pulse sequences of the brain and surrounding structures were obtained without intravenous contrast. COMPARISON:  Prior head CT examinations 04/07/2019 and earlier FINDINGS: Brain: Multiple sequences are motion degraded, limiting evaluation. Most notably there is moderate motion degradation of the sagittal T1 weighted sequence, moderate/severe motion degradation of the axial SWI sequence, severe motion degradation of the axial T1 weighted sequence and moderate motion degradation of the axial T2 weighted sequence. There is no evidence of acute infarct. No evidence of intracranial mass. No midline shift or extra-axial fluid collection. No chronic intracranial blood products. No significant white matter disease for age. There is mild generalized parenchymal atrophy. There is mild lateral and third ventriculomegaly which may be related to central predominant atrophy. A component of normal pressure hydrocephalus cannot be excluded. Vascular: Flow voids maintained within the proximal large arterial vessels. Skull and upper  cervical spine: No focal marrow lesion. Ligamentous hypertrophy/pannus formation at the level of the skull base and C1-C2 articulation with mild narrowing of the craniocervical junction. Sinuses/Orbits: Visualized orbits demonstrate no acute abnormality. Trace ethmoid and maxillary sinus mucosal thickening at the imaged levels. No significant mastoid effusion. IMPRESSION: 1. Motion degraded examination as described. 2. No evidence of acute infarct. 3. Mild lateral and third ventriculomegaly may be related to central predominant atrophy. A component of normal pressure hydrocephalus cannot be excluded. 4. Ligamentous hypertrophy/pannus formation at the level of the skull base and C1-C2 articulation with resultant mild narrowing of the craniocervical junction. 5. Mild ethmoid and maxillary sinus mucosal thickening. Electronically Signed   By: Kellie Simmering DO   On: 10/10/2019 16:34    ____________________________________________  PROCEDURES   Procedure(s) performed (including Critical Care):  Procedures  ____________________________________________  INITIAL IMPRESSION / MDM / [place] / ED COURSE  As part of my medical decision making, I reviewed the following data within the [facility] notes reviewed and incorporated, Old chart reviewed, Notes from prior ED visits, and [place] Controlled Substance Database       *Peter Robertson was evaluated in Emergency Department on 10/10/2019 for the symptoms described in the history of present illness. He was evaluated in the context of the global COVID-19 pandemic, which necessitated consideration that the patient might be at risk for infection with the SARS-CoV-2 virus that causes COVID-19. Institutional protocols and algorithms that pertain to the evaluation of patients at risk for COVID-19 are in a state of rapid change based on information released by regulatory bodies including the CDC and federal and state organizations.  These policies and algorithms were followed during the patient's care in the ED.  Some ED evaluations and interventions may be  delayed as a result of limited staffing during the pandemic.*     Medical Decision Making:  79 yo M here with lightheadedness upon standing, difficulty ambulating, and reported loose stools. On exam, he has past-pointing on bilateral FTN. No other focal deficits. DDx includes BPPV vs central vertigo, with possible component of orthostasis. His labs are overall reassuring. CMP unremarkable and renal function normal. He is not overtly orthostatic here but does report diarrhea and has difficulty standing. Abdomen soft, NT, ND, normal WBC, do not suspect diverticulitis or other intra-abd emergency. Given his age, MR obtained to eval for central cause and is unremarkable.   Given ?ventriculomegaly on MR, discussed with Neuro who evaluated - this can likely be worked up as outpt. Unlikely causing his sx. He remains vertiginous with inability to ambulate. Admit.  Attempted to call family/caregiver [name]. No HCPOA on file, so hesitant to leave message. Will have hospitalist try to discuss. ____________________________________________  FINAL CLINICAL IMPRESSION(S) / ED DIAGNOSES  Final diagnoses:  Benign paroxysmal positional vertigo, unspecified laterality  Dehydration  Inability to walk     MEDICATIONS GIVEN DURING THIS VISIT:  Medications  sodium chloride 0.9 % bolus 500 mL (0 mLs Intravenous Stopped 10/10/19 1448)  LORazepam (ATIVAN) injection 0.5 mg (0.5 mg Intravenous Given 10/10/19 1538)  meclizine (ANTIVERT) tablet 12.5 mg (12.5 mg Oral Given 10/10/19 1823)     ED Discharge Orders    None       Note:  This document was prepared using Dragon voice recognition software and may include unintentional dictation errors.   Duffy Bruce, MD 10/10/19 2014    Duffy Bruce, MD 10/10/19 2036

## 2019-10-10 NOTE — ED Notes (Signed)
MD Isaacs aware of BP

## 2019-10-10 NOTE — ED Triage Notes (Signed)
Pt presents from home via [facility] with c/o fall, per EMS pt noted to be orthostatic upon EMS arrival, BP [id number] systolic and dropped to [id number] systolic with position changes. EMS reports pt c/o diarrhea x 2 days that is dark colored and watery.

## 2019-10-10 NOTE — Consult Note (Signed)
[facility] TeleNeurology Consult Services  Stat Consult  Date of Service:   10/10/2019 17:45:16  Impression:     .  R55 - Syncope (blackout, fainting, vasovagal attack)  Comments/Sign-Out: 79 y/o man with h/o DM presents to the ED with syncope. Patient has been having gait difficulty and vertigo for 2 years. STAT teleneurology consult requested for MRI findings of enlarged ventricles (likely due to atrophy)  Metrics: [facility] Notification Time: 10/10/2019 17:42:27 Stamp Time: 10/10/2019 17:45:16 Callback Response Time: 10/10/2019 17:47:15  Our recommendations are outlined below.  Recommendations:     .  Syncope work-up as per ED and primary team     .  Consider outpatient neurology and neurosurgery work-up for enlarged ventricles (likely atrophy)   Therapies:     .  Physical Therapy     .  Occupational Therapy  Disposition: Neurology Follow Up Recommended  Sign Out:     .  Discussed with Emergency Department Provider  ----------------------------------------------------------------------------------------------------  Chief Complaint: syncope  History of Present Illness: Patient is a 79 year old Male.  79 y/o man with h/o DM presents to the ED with syncope. Patient has been having gait difficulty and vertigo for 2 years. STAT teleneurology consult requested for MRI findings of enlarged ventricles. MRI brain reviewed and case discussed with ED attending. Patient said he came to the ED because I passed out shortly using the restroom. Patient has been having diarrhea. No h/o seizures. No tongue bite. No family at bedside. NIHSS 0.       Examination: BP(180/84), Pulse(81), Blood Glucose(201) 1A: Level of Consciousness - Alert; keenly responsive + 0 1B: Ask Month and Age - Both Questions Right + 0 1C: Blink Eyes & Squeeze Hands - Performs Both Tasks + 0 2: Test Horizontal Extraocular Movements - Normal + 0 3: Test Visual Fields - No  Visual Loss + 0 4: Test Facial Palsy (Use Grimace if Obtunded) - Normal symmetry + 0 5A: Test Left Arm Motor Drift - No Drift for 10 Seconds + 0 5B: Test Right Arm Motor Drift - No Drift for 10 Seconds + 0 6A: Test Left Leg Motor Drift - No Drift for 5 Seconds + 0 6B: Test Right Leg Motor Drift - No Drift for 5 Seconds + 0 7: Test Limb Ataxia (FNF/Heel-Shin) - No Ataxia + 0 8: Test Sensation - Normal; No sensory loss + 0 9: Test Language/Aphasia - Normal; No aphasia + 0 10: Test Dysarthria - Normal + 0 11: Test Extinction/Inattention - No abnormality + 0  NIHSS Score: 0    Due to the immediate potential for life-threatening deterioration due to underlying acute neurologic illness, I spent 20 minutes providing critical care. This time includes time for face to face visit via telemedicine, review of medical records, imaging studies and discussion of findings with providers, the patient and/or family.   Dr Meryl Crutch   [facility] [phone]  Case [id number]

## 2019-10-10 NOTE — ED Notes (Signed)
Attempted to call report

## 2019-10-10 NOTE — ED Notes (Signed)
PT taken to MRI

## 2019-10-10 NOTE — ED Notes (Signed)
EDP at bedside at this time.  

## 2019-10-10 NOTE — H&P (Signed)
History and Physical    Peter Robertson HQP:[id number] DOB: 06-19-1941 DOA: 10/10/2019  PCP: Danae Orleans, MD  Patient coming from: home   Chief Complaint: vertigo  HPI: Peter Robertson is a 79 y.o. 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  Pt says he has suffered from vertigo for about a year. It is present most of the time, exacerbated by reclining and moving to one side or the other. A room spinning sensation. Sitting up and holding still helps some but it doesn't resolve. Unable to provide much information about prior evaluations/treatments but pcp notes pt has participated in vestibular rehab. Denies headache or tinnitus or hearing changes. Denies focal weakness or numbness. Has trouble walking and sometimes falls because of the room spinning sensation. Denies recent fall. Denies toxic habits. Denies worsening urinary incontinence. Lives alone.  Also endorses one week of dark black stools. Does take aspirin and plavix. Denies other nsaids. Denies history GI bleed. Denies blood in vomit. Had colonoscopy in 2016 with polyps. Denies abdominal pain, denies chest pain. No fevers or covid contacts.   ED Course: imaging, neurology consult, labs, anti-emetics. Pt unable to ambulate unassisted.  Review of Systems: As per HPI otherwise 10 point review of systems negative.    Past Medical History:  Diagnosis Date  . Asthma   . BPH (benign prostatic hyperplasia)   . Chronic kidney disease    Kidney Stones  . Coronary artery disease   . Diabetes mellitus without complication ([place])   . Heart murmur   . Hyperlipidemia   . Hypertension   . Myocardial infarction [facility])     Past Surgical History:  Procedure Laterality Date  . COLONOSCOPY WITH PROPOFOL N/A 12/03/2014   Procedure: COLONOSCOPY WITH PROPOFOL;  Surgeon: Manya Silvas, MD;  Location: [facility]  ENDOSCOPY;  Service: Endoscopy;  Laterality: N/A;  . CORONARY ARTERY BYPASS GRAFT    . LEFT HEART CATH AND CORS/GRAFTS ANGIOGRAPHY N/A 05/22/2018   Procedure: LEFT HEART CATH AND CORS/GRAFTS ANGIOGRAPHY poss PCI;  Surgeon: Minna Merritts, MD;  Location: [facility];  Service: Cardiovascular;  Laterality: N/A;     reports that he quit smoking about 35 years ago. He has never used smokeless tobacco. He reports that he does not drink alcohol or use drugs.  Allergies  Allergen Reactions  . Sulfamethoxazole-Trimethoprim Other (See Comments)    dizziness dizziness dizziness   . Tape Rash    blisters    Family History  Problem Relation Age of Onset  . Diabetes Mother   . Cancer Mother   . Hypertension Father   . Hypertension Brother   . Diabetes Brother     Prior to Admission medications   Medication Sig Start Date End Date Taking? Authorizing Provider  insulin regular (NOVOLIN R) 100 units/mL injection Inject into the skin. 05/26/19 05/25/20 Yes [provider]  pantoprazole (PROTONIX) 40 MG tablet Take by mouth. 01/03/16  Yes [provider]  Sodium Phosphates (ENEMA) 7-19 GM/118ML ENEM Place rectally. 09/09/19  Yes [provider]  aspirin EC 81 MG tablet Take 81 mg by mouth at bedtime.     [provider]  atorvastatin (LIPITOR) 80 MG tablet Take 80 mg by mouth at bedtime.    [provider]  blood glucose meter kit and supplies KIT Dispense based on patient and insurance preference. Use up to four times daily as  directed. (FOR ICD-9 250.00, 250.01). 05/18/19   Rudene Re, MD  cholecalciferol (VITAMIN D3) 25 MCG (1000 UT) tablet Take 1,000 Units by mouth 2 (two) times daily.    [provider]  clopidogrel (PLAVIX) 75 MG tablet Take 75 mg by mouth daily. 08/15/19   [provider]  furosemide (LASIX) 20 MG tablet Take 1 tablet (20 mg total) by mouth daily. do not take the Lasix every day take 1 pill every  other day. Patient taking differently: Take 20 mg by mouth every other day.  06/29/17 06/29/18  Nena Polio, MD  insulin glargine (LANTUS) 100 UNIT/ML injection Inject 24 Units into the skin at bedtime.     [provider]  isosorbide mononitrate (IMDUR) 60 MG 24 hr tablet Take 60 mg by mouth daily. 07/03/18   [provider]  ketorolac (ACULAR) 0.5 % ophthalmic solution Place 1 drop into the right eye 4 (four) times daily. 08/14/18   [provider]  Melatonin 10 MG CAPS Take by mouth.    [provider]  metFORMIN (GLUCOPHAGE) 1000 MG tablet Take 1 tablet (1,000 mg total) by mouth 2 (two) times daily with a meal. Hold it for 48 hrs due to cath/dye load and resume it on 05/24/2018 05/22/18   Max Sane, MD  metFORMIN (GLUCOPHAGE) 500 MG tablet Take 1,000 mg by mouth 2 (two) times daily. 09/22/19   [provider]  metoprolol succinate (TOPROL-XL) 50 MG 24 hr tablet Take 50 mg by mouth at bedtime.    [provider]  moxifloxacin (VIGAMOX) 0.5 % ophthalmic solution Place 1 drop into the right eye 4 (four) times daily. 08/14/18   [provider]  Multiple Vitamins-Minerals (PRESERVISION AREDS 2+MULTI VIT PO) Take by mouth.    [provider]  nitroGLYCERIN (NITROSTAT) 0.4 MG SL tablet Place 0.4 mg under the tongue every 5 (five) minutes as needed for chest pain.     [provider]  omeprazole (PRILOSEC) 40 MG capsule Take 40 mg by mouth 2 (two) times daily. 07/19/19   [provider]  prednisoLONE acetate (PRED FORTE) 1 % ophthalmic suspension Place 1 drop into the right eye 4 (four) times daily. 08/14/18   [provider]  sertraline (ZOLOFT) 100 MG tablet Take 150 mg by mouth at bedtime.    [provider]  tamsulosin (FLOMAX) 0.4 MG CAPS capsule Take 0.4 mg by mouth at bedtime.     [provider]    Physical Exam: Vitals:   10/10/19 1644 10/10/19 1700 10/10/19 1730 10/10/19 1800   BP: (!) 189/87 (!) 179/95 (!) 172/98 (!) 180/84  Pulse: 81 (!) 57 70 75  Resp: (!) 24 18 (!) 24 (!) 23  Temp:      TempSrc:      SpO2: 98% 97% 97% 96%  Weight:      Height:        Constitutional: No acute distress Head: Atraumatic Eyes: Conjunctiva clear. Pupils assymetric ENM: Moist mucous membranes. poordentition.  Neck: Supple Respiratory: Clear to auscultation bilaterally, no wheezing/rales/rhonchi. Normal respiratory effort. No accessory muscle use. . Cardiovascular: Regular rate and rhythm. Soft systolic murmur. Abdomen: Non-tender, mildly-distended. No masses. No rebound or guarding. Positive bowel sounds. Musculoskeletal: No joint deformity upper and lower extremities. Normal ROM, no contractures. Normal muscle tone.  Skin: bruising on arms Extremities: trace LE peripheral edema. Palpable peripheral pulses. Neurologic: Alert, moving all 4 extremities. 5/5 upper/lower strength. No nystabgmus. Pupils unequal, cn2-12 otherwise intact. Distal sensation intact.  Dysarthric gait per ED Psychiatric: appears somewhat confused   Labs on Admission: I have personally reviewed following labs and imaging studies  CBC: Recent Labs  Lab 10/10/19 1354  WBC 7.8  NEUTROABS 6.5  HGB 11.6*  HCT 34.4*  MCV 82.9  PLT 99*   Basic Metabolic Panel: Recent Labs  Lab 10/10/19 1354  NA 138  K 4.8  CL 105  CO2 25  GLUCOSE 201*  BUN 32*  CREATININE 1.22  CALCIUM 9.1   GFR: Estimated Creatinine Clearance: 49.2 mL/min (by C-G formula based on SCr of 1.22 mg/dL). Liver Function Tests: Recent Labs  Lab 10/10/19 1354  AST 15  ALT 13  ALKPHOS 95  BILITOT 1.0  PROT 7.5  ALBUMIN 4.3   Recent Labs  Lab 10/10/19 1354  LIPASE 27   No results for input(s): AMMONIA in the last 168 hours. Coagulation Profile: No results for input(s): INR, PROTIME in the last 168 hours. Cardiac Enzymes: No results for input(s): CKTOTAL, CKMB, CKMBINDEX, TROPONINI in the last 168 hours. BNP (last  3 results) No results for input(s): PROBNP in the last 8760 hours. HbA1C: No results for input(s): HGBA1C in the last 72 hours. CBG: No results for input(s): GLUCAP in the last 168 hours. Lipid Profile: No results for input(s): CHOL, HDL, LDLCALC, TRIG, CHOLHDL, LDLDIRECT in the last 72 hours. Thyroid Function Tests: No results for input(s): TSH, T4TOTAL, FREET4, T3FREE, THYROIDAB in the last 72 hours. Anemia Panel: No results for input(s): VITAMINB12, FOLATE, FERRITIN, TIBC, IRON, RETICCTPCT in the last 72 hours. Urine analysis:    Component Value Date/Time   COLORURINE STRAW (A) 10/10/2019 1542   APPEARANCEUR CLEAR (A) 10/10/2019 1542   LABSPEC 1.011 10/10/2019 1542   PHURINE 6.0 10/10/2019 1542   GLUCOSEU 50 (A) 10/10/2019 1542   HGBUR NEGATIVE 10/10/2019 1542   [place] 10/10/2019 1542   KETONESUR NEGATIVE 10/10/2019 1542   PROTEINUR 100 (A) 10/10/2019 1542   NITRITE NEGATIVE 10/10/2019 1542   LEUKOCYTESUR NEGATIVE 10/10/2019 1542    Radiological Exams on Admission: MR BRAIN WO CONTRAST  Result Date: 10/10/2019 CLINICAL DATA:  Vertigo, central. Additional history provided: Patient presents via EMS with fall, orthostatic upon arrival, diarrhea for 2 days. EXAM: MRI HEAD WITHOUT CONTRAST TECHNIQUE: Multiplanar, multiecho pulse sequences of the brain and surrounding structures were obtained without intravenous contrast. COMPARISON:  Prior head CT examinations 04/07/2019 and earlier FINDINGS: Brain: Multiple sequences are motion degraded, limiting evaluation. Most notably there is moderate motion degradation of the sagittal T1 weighted sequence, moderate/severe motion degradation of the axial SWI sequence, severe motion degradation of the axial T1 weighted sequence and moderate motion degradation of the axial T2 weighted sequence. There is no evidence of acute infarct. No evidence of intracranial mass. No midline shift or extra-axial fluid collection. No chronic  intracranial blood products. No significant white matter disease for age. There is mild generalized parenchymal atrophy. There is mild lateral and third ventriculomegaly which may be related to central predominant atrophy. A component of normal pressure hydrocephalus cannot be excluded. Vascular: Flow voids maintained within the proximal large arterial vessels. Skull and upper cervical spine: No focal marrow lesion. Ligamentous hypertrophy/pannus formation at the level of the skull base and C1-C2 articulation with mild narrowing of the craniocervical junction. Sinuses/Orbits: Visualized orbits demonstrate no acute abnormality. Trace ethmoid and maxillary sinus mucosal thickening at the imaged levels. No significant mastoid effusion. IMPRESSION: 1. Motion degraded examination as described. 2. No evidence of acute infarct. 3. Mild lateral and third  ventriculomegaly may be related to central predominant atrophy. A component of normal pressure hydrocephalus cannot be excluded. 4. Ligamentous hypertrophy/pannus formation at the level of the skull base and C1-C2 articulation with resultant mild narrowing of the craniocervical junction. 5. Mild ethmoid and maxillary sinus mucosal thickening. Electronically Signed   By: Kellie Simmering DO   On: 10/10/2019 16:34      Assessment/Plan Active Problems:   BPPV (benign paroxysmal positional vertigo)   CAD (coronary artery disease)   Diabetes mellitus (HCC)   BPH (benign prostatic hyperplasia)   Thrombocytopenia (HCC)   Dark stools   # Vertigo - long-standing. That it is provoked by movements particularly with turning head suggests BPPV. MRI today not suggestive of a central process. Enlarged ventricles raise slight suspicion of np hydrocephalus but vertigo not typically a predominant symptom of that condition. Unclear to what extent pt has participated in vestibular rehab. Unable to ambulate in ED, hence admission. Neuro consulted, advise outpatient eval once able to  discharge.  - pt/ot eval - meclizine bid, holding on further benzodiazepines given age but could consider adding as needed. zofran prn - involve neurology inpatient as needed  # Dark stools - pt said as an aside that he has had one week of tarry dark stools. hgb is stable. Denies hx gi bleed, last colonoscopy 5 years ago showing polyps - trend cbc - fecal occult blood ordered - holding aspirin/plavix  # CAD - asymptomatic - cont home atorva, lasix, imdur. Hold plavix and asa as above  # DM - here glucose elevated to 201 - lantus 24 qhs, hold home SSI, institute our SSI  # Thrombocytopenia - stable at 99. Had heme eval that advised no further evaluation and instead monitoring  # hypertension - here bp elevated, unclear if pt took home bp meds today - resume home meds, monitor   DVT prophylaxis: scds Code Status: full  Family Communication: neighbor Lynelle Smoke is his primary contact  Disposition Plan: tbd  Consults called: neurology  Admission status: observation med/surg    Desma Maxim MD [facility] Pager [phone]  If 7PM-7AM, please contact night-coverage [email or web address] Password [facility]  10/10/2019, 6:54 PM

## 2019-10-11 DIAGNOSIS — N400 Enlarged prostate without lower urinary tract symptoms: Secondary | ICD-10-CM | POA: Diagnosis present

## 2019-10-11 DIAGNOSIS — I129 Hypertensive chronic kidney disease with stage 1 through stage 4 chronic kidney disease, or unspecified chronic kidney disease: Secondary | ICD-10-CM | POA: Diagnosis present

## 2019-10-11 DIAGNOSIS — E119 Type 2 diabetes mellitus without complications: Secondary | ICD-10-CM | POA: Diagnosis not present

## 2019-10-11 DIAGNOSIS — H8110 Benign paroxysmal vertigo, unspecified ear: Principal | ICD-10-CM

## 2019-10-11 DIAGNOSIS — E1122 Type 2 diabetes mellitus with diabetic chronic kidney disease: Secondary | ICD-10-CM | POA: Diagnosis present

## 2019-10-11 DIAGNOSIS — R195 Other fecal abnormalities: Secondary | ICD-10-CM | POA: Diagnosis present

## 2019-10-11 DIAGNOSIS — I2510 Atherosclerotic heart disease of native coronary artery without angina pectoris: Secondary | ICD-10-CM | POA: Diagnosis present

## 2019-10-11 DIAGNOSIS — Z7982 Long term (current) use of aspirin: Secondary | ICD-10-CM | POA: Diagnosis not present

## 2019-10-11 DIAGNOSIS — E860 Dehydration: Secondary | ICD-10-CM | POA: Diagnosis present

## 2019-10-11 DIAGNOSIS — Z9181 History of falling: Secondary | ICD-10-CM | POA: Diagnosis not present

## 2019-10-11 DIAGNOSIS — N182 Chronic kidney disease, stage 2 (mild): Secondary | ICD-10-CM | POA: Diagnosis present

## 2019-10-11 DIAGNOSIS — Z951 Presence of aortocoronary bypass graft: Secondary | ICD-10-CM | POA: Diagnosis not present

## 2019-10-11 DIAGNOSIS — D696 Thrombocytopenia, unspecified: Secondary | ICD-10-CM | POA: Diagnosis present

## 2019-10-11 DIAGNOSIS — R197 Diarrhea, unspecified: Secondary | ICD-10-CM | POA: Diagnosis present

## 2019-10-11 DIAGNOSIS — R42 Dizziness and giddiness: Secondary | ICD-10-CM | POA: Diagnosis present

## 2019-10-11 DIAGNOSIS — Z833 Family history of diabetes mellitus: Secondary | ICD-10-CM | POA: Diagnosis not present

## 2019-10-11 DIAGNOSIS — Z87891 Personal history of nicotine dependence: Secondary | ICD-10-CM | POA: Diagnosis not present

## 2019-10-11 DIAGNOSIS — J45909 Unspecified asthma, uncomplicated: Secondary | ICD-10-CM | POA: Diagnosis present

## 2019-10-11 DIAGNOSIS — Z794 Long term (current) use of insulin: Secondary | ICD-10-CM | POA: Diagnosis not present

## 2019-10-11 DIAGNOSIS — Z20822 Contact with and (suspected) exposure to covid-19: Secondary | ICD-10-CM | POA: Diagnosis present

## 2019-10-11 DIAGNOSIS — Z882 Allergy status to sulfonamides status: Secondary | ICD-10-CM | POA: Diagnosis not present

## 2019-10-11 DIAGNOSIS — R296 Repeated falls: Secondary | ICD-10-CM | POA: Diagnosis present

## 2019-10-11 DIAGNOSIS — R269 Unspecified abnormalities of gait and mobility: Secondary | ICD-10-CM | POA: Diagnosis present

## 2019-10-11 DIAGNOSIS — E785 Hyperlipidemia, unspecified: Secondary | ICD-10-CM | POA: Diagnosis present

## 2019-10-11 DIAGNOSIS — D860 Sarcoidosis of lung: Secondary | ICD-10-CM | POA: Diagnosis present

## 2019-10-11 DIAGNOSIS — I252 Old myocardial infarction: Secondary | ICD-10-CM | POA: Diagnosis not present

## 2019-10-11 LAB — CBC
HCT: 34.9 % — ABNORMAL LOW (ref 39.0–52.0)
Hemoglobin: 11.7 g/dL — ABNORMAL LOW (ref 13.0–17.0)
MCH: 27.9 pg (ref 26.0–34.0)
MCHC: 33.5 g/dL (ref 30.0–36.0)
MCV: 83.1 fL (ref 80.0–100.0)
Platelets: 100 10*3/uL — ABNORMAL LOW (ref 150–400)
RBC: 4.2 MIL/uL — ABNORMAL LOW (ref 4.22–5.81)
RDW: 15.2 % (ref 11.5–15.5)
WBC: 9.8 10*3/uL (ref 4.0–10.5)
nRBC: 0 % (ref 0.0–0.2)

## 2019-10-11 LAB — BASIC METABOLIC PANEL
Anion gap: 9 (ref 5–15)
BUN: 22 mg/dL (ref 8–23)
CO2: 23 mmol/L (ref 22–32)
Calcium: 8.9 mg/dL (ref 8.9–10.3)
Chloride: 102 mmol/L (ref 98–111)
Creatinine, Ser: 1.1 mg/dL (ref 0.61–1.24)
GFR calc Af Amer: >60 mL/min (ref >60)
GFR calc non Af Amer: >60 mL/min (ref >60)
Glucose, Bld: 132 mg/dL — ABNORMAL HIGH (ref 70–99)
Potassium: 4 mmol/L (ref 3.5–5.1)
Sodium: 134 mmol/L — ABNORMAL LOW (ref 135–145)

## 2019-10-11 LAB — GLUCOSE, CAPILLARY
Glucose-Capillary: 101 mg/dL — ABNORMAL HIGH (ref 70–99)
Glucose-Capillary: 157 mg/dL — ABNORMAL HIGH (ref 70–99)
Glucose-Capillary: 179 mg/dL — ABNORMAL HIGH (ref 70–99)
Glucose-Capillary: 226 mg/dL — ABNORMAL HIGH (ref 70–99)

## 2019-10-11 LAB — SARS CORONAVIRUS 2 (TAT 6-24 HRS): SARS Coronavirus 2: NEGATIVE

## 2019-10-11 MED ORDER — HYDROCODONE-ACETAMINOPHEN 5-325 MG PO TABS
1.0000 | ORAL_TABLET | Freq: Four times a day (QID) | ORAL | Status: DC | PRN
  Administered 2019-10-12 (×2): 1 via ORAL
  Filled 2019-10-11 (×2): qty 1

## 2019-10-11 MED ORDER — HYDRALAZINE HCL 25 MG PO TABS: 25.0000 mg | ORAL_TABLET | Freq: Three times a day (TID) | ORAL | Status: DC | PRN

## 2019-10-11 MED ORDER — ACETAMINOPHEN 325 MG PO TABS
650.0000 mg | ORAL_TABLET | Freq: Four times a day (QID) | ORAL | Status: DC | PRN
  Administered 2019-10-13: 650 mg via ORAL
  Filled 2019-10-11: qty 2

## 2019-10-11 NOTE — NC FL2 (Signed)
[place] LEVEL OF CARE SCREENING TOOL     IDENTIFICATION  Patient Name: Peter Robertson Birthdate: 30-Sep-1940 Sex: male Admission Date (Current Location): 10/10/2019  [place] and [place] Number:  Engineering geologist and Address:  [facility], [address] [place], [place] [address]      Provider Number: [id number]  Attending Physician Name and Address:  Ezekiel Slocumb, DO  Relative Name and Phone Number:       Current Level of Care: Hospital Recommended Level of Care: [place] Prior Approval Number:    Date Approved/Denied:   PASRR Number:    Discharge Plan: SNF    Current Diagnoses: Patient Active Problem List   Diagnosis Date Noted  . Vertigo 10/11/2019  . BPPV (benign paroxysmal positional vertigo) 10/10/2019  . CAD (coronary artery disease) 10/10/2019  . Diabetes mellitus ([place]) 10/10/2019  . BPH (benign prostatic hyperplasia) 10/10/2019  . Thrombocytopenia ([place]) 10/10/2019  . Dark stools 10/10/2019  . Unstable angina ([place]) 05/20/2018  . Chest pain 01/30/2017    Orientation RESPIRATION BLADDER Height & Weight     Self, Time, Situation, Place  Normal Continent Weight: 180 lb 5.4 oz (81.8 kg) Height:  5\' 6"  (167.6 cm)  BEHAVIORAL SYMPTOMS/MOOD NEUROLOGICAL BOWEL NUTRITION STATUS      Continent    AMBULATORY STATUS COMMUNICATION OF NEEDS Skin   Limited Assist Verbally Normal                       Personal Care Assistance Level of Assistance  Bathing, Dressing Bathing Assistance: Limited assistance   Dressing Assistance: Limited assistance     Functional Limitations Info  Sight Sight Info: Impaired        SPECIAL CARE FACTORS FREQUENCY  PT (By licensed PT), OT (By licensed OT)                    Contractures Contractures Info: Not present    Additional Factors Info  Code Status Code Status Info: FULL CODE             Current Medications (10/11/2019):  This is the  current hospital active medication list Current Facility-Administered Medications  Medication Dose Route Frequency Provider Last Rate Last Admin  . 0.9 %  sodium chloride infusion  250 mL Intravenous PRN Wouk, Ailene Rud, MD      . acetaminophen (TYLENOL) tablet 650 mg  650 mg Oral Q6H PRN Nicole Kindred A, DO      . atorvastatin (LIPITOR) tablet 80 mg  80 mg Oral QHS Gwynne Edinger, MD   80 mg at 10/10/19 2249  . furosemide (LASIX) tablet 20 mg  20 mg Oral QODAY Wouk, Ailene Rud, MD   20 mg at 10/11/19 1017  . hydrALAZINE (APRESOLINE) tablet 25 mg  25 mg Oral Q8H PRN Nicole Kindred A, DO      . HYDROcodone-acetaminophen (NORCO/VICODIN) 5-325 MG per tablet 1 tablet  1 tablet Oral Q6H PRN Nicole Kindred A, DO      . insulin aspart (novoLOG) injection 0-15 Units  0-15 Units Subcutaneous TID WC Wouk, Ailene Rud, MD   3 Units at 10/11/19 1223  . insulin glargine (LANTUS) injection 24 Units  24 Units Subcutaneous QHS Gwynne Edinger, MD   24 Units at 10/10/19 2253  . isosorbide mononitrate (IMDUR) 24 hr tablet 60 mg  60 mg Oral Daily Gwynne Edinger, MD   60 mg at 10/11/19 1017  .  meclizine (ANTIVERT) tablet 50 mg  50 mg Oral BID Gwynne Edinger, MD   50 mg at 10/11/19 1135  . metoprolol succinate (TOPROL-XL) 24 hr tablet 150 mg  150 mg Oral QHS Gwynne Edinger, MD   150 mg at 10/10/19 2248  . ondansetron (ZOFRAN) injection 4 mg  4 mg Intravenous Q6H PRN Gwynne Edinger, MD   4 mg at 10/10/19 2318  . pantoprazole (PROTONIX) EC tablet 40 mg  40 mg Oral Daily Gwynne Edinger, MD   40 mg at 10/11/19 1017  . sertraline (ZOLOFT) tablet 150 mg  150 mg Oral QHS Gwynne Edinger, MD   150 mg at 10/10/19 2248  . sodium chloride flush (NS) 0.9 % injection 3 mL  3 mL Intravenous Q12H Wouk, Ailene Rud, MD   3 mL at 10/11/19 1018  . sodium chloride flush (NS) 0.9 % injection 3 mL  3 mL Intravenous PRN Wouk, Ailene Rud, MD      . tamsulosin [facility]) capsule 0.4 mg  0.4 mg Oral  QHS Wouk, Ailene Rud, MD   0.4 mg at 10/10/19 2248     Discharge Medications: Please see discharge summary for a list of discharge medications.  Relevant Imaging Results:  Relevant Lab Results:   Additional Information SS# [id number]  [name], [name]

## 2019-10-11 NOTE — Evaluation (Signed)
Occupational Therapy Evaluation Patient Details Name: Peter Robertson MRN: [id number] DOB: [birth year] Today's Date: 10/11/2019    History of Present Illness 79 y/o man with h/o DM presents to the ED with syncope. Patient has been having gait difficulty and vertigo for 2 years. STAT teleneurology consult requested for MRI findings of enlarged ventricles. MRI brain reviewed and case discussed with ED attending. Patient said he came to the ED because I passed out shortly using the restroom. Patient has been having diarrhea. No h/o seizures. No tongue bite. No family at bedside.   Clinical Impression   Peter Robertson was seen for OT evaluation this date. Prior to hospital admission, pt was independent c ADLs and lives alone. Pt presents to acute OT demonstrating impaired ADL performance and functional mobility 2/2 functional balance/endurance deficits. Pt currently requires increased time for self-drinking and don socks seated in chair. Pt reported increased dizziness bending for LB access and deferred OOC mobility 2/2 nausea. Pt would benefit from skilled OT to address noted impairments and functional limitations (see below for any additional details) in order to maximize safety and independence while minimizing falls risk and caregiver burden. Upon hospital discharge, recommend STR to maximize pt safety and return to PLOF.     Follow Up Recommendations  SNF;Supervision/Assistance - 24 hour    Equipment Recommendations       Recommendations for Other Services       Precautions / Restrictions Precautions Precautions: Fall Restrictions Weight Bearing Restrictions: No      Mobility Bed Mobility Overal bed mobility: Needs Assistance Bed Mobility: Supine to Sit     Supine to sit: HOB elevated;Min guard     General bed mobility comments: Pt received and left up in chair   Transfers Overall transfer level: Needs assistance Equipment used: Rolling walker (2 wheeled) Transfers: Sit to/from  Stand Sit to Stand: Min guard         General transfer comment: Pt deferred 2/2 dizziness c mobility and dizziness during LBD had not subsided    Balance Overall balance assessment: Needs assistance Sitting-balance support: Feet supported;No upper extremity supported Sitting balance-Leahy Scale: Fair Sitting balance - Comments: Static and dynamic sitting EOC    Standing balance support: During functional activity;Bilateral upper extremity supported Standing balance-Leahy Scale: Poor Standing balance comment: Patient very unsteady in standing, definite need for RW                           ADL either performed or assessed with clinical judgement   ADL Overall ADL's : Needs assistance/impaired                                       General ADL Comments: MOD I for increased time self-drinking and don/doff socks seated in chair. Pt reports nausea and dizziniess bending over for LB access.      Vision         Perception     Praxis      Pertinent Vitals/Pain Pain Assessment: No/denies pain     Hand Dominance Right   Extremity/Trunk Assessment Upper Extremity Assessment Upper Extremity Assessment: Overall WFL for tasks assessed   Lower Extremity Assessment Lower Extremity Assessment: Overall WFL for tasks assessed   Cervical / Trunk Assessment Cervical / Trunk Assessment: Normal   Communication Communication Communication: No difficulties   Cognition Arousal/Alertness: Awake/alert Behavior During Therapy:  WFL for tasks assessed/performed Overall Cognitive Status: Within Functional Limits for tasks assessed                                     General Comments       Exercises Exercises: Other exercises Other Exercises Other Exercises: Pt educated re: falls prevention, energy conservation, dressing technique,  Other Exercises: LBD, self-drinking, simulated UBD   Shoulder Instructions      Home Living Family/patient  expects to be discharged to:: Private residence Living Arrangements: Alone Available Help at Discharge: Neighbor;Available PRN/intermittently Type of Home: House Home Access: Stairs to enter [employer] of Steps: 3 Entrance Stairs-Rails: Right Home Layout: One level     Bathroom Shower/Tub: Teacher, early years/pre: Standard     Home Equipment: Environmental consultant - 2 wheels;Cane - single point;Bedside commode;Grab bars - tub/shower;Other (comment)(Tub transfer bench)   Additional Comments: Pt has daschund dog at home      Prior Functioning/Environment Level of Independence: Independent with assistive device(s)                 OT Problem List: Decreased activity tolerance;Impaired balance (sitting and/or standing);Impaired vision/perception      OT Treatment/Interventions: Self-care/ADL training;Therapeutic exercise;Energy conservation;DME and/or AE instruction;Therapeutic activities;Visual/perceptual remediation/compensation;Patient/family education;Balance training    OT Goals(Current goals can be found in the care plan section) Acute Rehab OT Goals Patient Stated Goal: to go home OT Goal Formulation: With patient Time For Goal Achievement: 10/25/19 Potential to Achieve Goals: Fair ADL Goals Pt Will Perform Grooming: sitting;with modified independence Pt Will Perform Lower Body Dressing: with modified independence;sitting/lateral leans Pt Will Transfer to Toilet: with supervision;ambulating;bedside commode(c LRAD PRN)  OT Frequency: Min 2X/week   Barriers to D/C: Decreased caregiver support;Inaccessible home environment          Co-evaluation              AM-PAC OT "6 Clicks" Daily Activity     Outcome Measure Help from another person eating meals?: None Help from another person taking care of personal grooming?: None Help from another person toileting, which includes using toliet, bedpan, or urinal?: A Little Help from another person bathing  (including washing, rinsing, drying)?: A Little Help from another person to put on and taking off regular upper body clothing?: None Help from another person to put on and taking off regular lower body clothing?: A Little 6 Click Score: 21   End of Session    Activity Tolerance: Other (comment)(Pt limited by dizziness) Patient left: in chair;with call bell/phone within reach;with chair alarm set  OT Visit Diagnosis: Unsteadiness on feet (R26.81);Other abnormalities of gait and mobility (R26.89)                Time: [id number] OT Time Calculation (min): 18 min Charges:  OT General Charges $OT Visit: 1 Visit OT Evaluation $OT Eval Moderate Complexity: 1 Mod OT Treatments $Self Care/Home Management : 8-22 mins  Dessie Coma, M.S. OTR/L  10/11/19, 1:22 PM

## 2019-10-11 NOTE — Hospital Course (Signed)
Peter Robertson is a 79 y.o. 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   His dizziness is aggravated by change in position, described as room spinning.  Patient has had vertigo for approximately one year, being followed by PCP and had reportedly done vestibular rehab.  Patient's additional complaint on admission is dark stools for about one week.  He is on ASA & Plavix, but no other NSAIDs.  No history of GI bleeding.  Prior colonoscopy in 2016 with polyps.   Admitted to hospitalist service for further evaluation and management.

## 2019-10-11 NOTE — Evaluation (Signed)
Physical Therapy Evaluation Patient Details Name: Peter Robertson MRN: [id number] DOB: June 10, 1941 Today's Date: 10/11/2019   History of Present Illness  (P) 79 y/o man with h/o DM presents to the ED with syncope. Patient has been having gait difficulty and vertigo for 2 years. STAT teleneurology consult requested for MRI findings of enlarged ventricles. MRI brain reviewed and case discussed with ED attending. Patient said he came to the ED because I passed out shortly using the restroom. Patient has been having diarrhea. No h/o seizures. No tongue bite. No family at bedside.  Clinical Impression  Patient able to complete supine to sit transfer CGA with HOB elevated, but once sitting is very dizzy and nauseas and takes increased time to establish balance and reduce dizziness symptoms with focal point exercise enough to continue. STS with RW patient is able to stand with cuing for RW negotiation, with CGA. Pt takes 4 shuffle steps with RW, with minA d/t patient BOS outside RW and very unsteady. Patient sits and says that he is very dizzy, cannot go further and feels like he has been hit by a truck. PT advises 24 hour supervision or SNF placement for patient in current state as he is very unsteady and fatigues to sit very quickly d/t dizziness symptoms. PT can re-assess this should dizziness improve. Would benefit from skilled PT to address above deficits and promote optimal return to PLOF.     Follow Up Recommendations SNF;Supervision/Assistance - 24 hour    Equipment Recommendations  Rolling walker with 5" wheels    Recommendations for Other Services       Precautions / Restrictions Precautions Precautions: (P) Fall Restrictions Weight Bearing Restrictions: (P) No      Mobility  Bed Mobility Overal bed mobility: Needs Assistance Bed Mobility: Supine to Sit     Supine to sit: HOB elevated;Min guard     General bed mobility comments: Cuing for efficiency of transfer with good carry  over  Transfers Overall transfer level: Needs assistance Equipment used: Rolling walker (2 wheeled) Transfers: Sit to/from Stand Sit to Stand: Min guard         General transfer comment: Patient able to complete transfer with CGA with RW following cuing for set up  Ambulation/Gait Ambulation/Gait assistance: Min assist Gait Distance (Feet): 4 Feet Assistive device: Rolling walker (2 wheeled) Gait Pattern/deviations: Shuffle Gait velocity: decreased   General Gait Details: Slow shuffle steps with minA for RW negotiation, as patient is outside RW and unsteady, patient reports he is very dizzy and needs to sit  Stairs            Wheelchair Mobility    Modified Rankin (Stroke Patients Only)       Balance Overall balance assessment: Needs assistance Sitting-balance support: Feet supported;No upper extremity supported Sitting balance-Leahy Scale: Fair     Standing balance support: During functional activity;Bilateral upper extremity supported Standing balance-Leahy Scale: Poor Standing balance comment: Patient very unsteady in standing, definite need for RW                             Pertinent Vitals/Pain Pain Assessment: (P) No/denies pain    Home Living Family/patient expects to be discharged to:: (P) Private residence Living Arrangements: (P) Alone Available Help at Discharge: (P) Neighbor;Available PRN/intermittently Type of Home: (P) House Home Access: (P) Stairs to enter Entrance Stairs-Rails: (P) Right Entrance Stairs-Number of Steps: (P) 3 Home Layout: (P) One level Home Equipment: (P)  Walker - 2 wheels;Cane - single point;Bedside commode;Grab bars - tub/shower;Other (comment)(Tub transfer bench) Additional Comments: (P) Pt has daschund dog at home    Prior Function Level of Independence: (P) Independent with assistive device(s)               Hand Dominance   Dominant Hand: (P) Right    Extremity/Trunk Assessment   Upper  Extremity Assessment Upper Extremity Assessment: (P) Overall WFL for tasks assessed    Lower Extremity Assessment Lower Extremity Assessment: (P) Overall WFL for tasks assessed    Cervical / Trunk Assessment Cervical / Trunk Assessment: (P) Normal  Communication   Communication: (P) No difficulties  Cognition Arousal/Alertness: (P) Awake/alert Behavior During Therapy: (P) WFL for tasks assessed/performed Overall Cognitive Status: (P) Within Functional Limits for tasks assessed                                        General Comments      Exercises Other Exercises Other Exercises: Supine to sit patient able to complete CGA with HOB elevated and cuing for hand placement. Following patient is able to establish seated balance with feet supported though he is very dizzy, resolves somewhat with focal point exercise Other Exercises: STS with RW with cuing for set up and hand placement, followied by amb to chair with minA for RW negotiation, as patient keeps BOS outside RW and is very unsteady. Following patietn reports he feels very dizzy and like he waws hit by a truck, unable to fully resolve   Assessment/Plan    PT Assessment Patient needs continued PT services  PT Problem List Decreased strength;Decreased mobility;Decreased range of motion;Decreased coordination;Decreased activity tolerance;Decreased balance       PT Treatment Interventions DME instruction;Therapeutic activities;Gait training;Therapeutic exercise;Patient/family education;Stair training;Balance training;Functional mobility training;Neuromuscular re-education;Manual techniques    PT Goals (Current goals can be found in the Care Plan section)  Acute Rehab PT Goals Patient Stated Goal: to go home PT Goal Formulation: With patient Time For Goal Achievement: 10/31/19 Potential to Achieve Goals: Fair    Frequency Min 2X/week   Barriers to discharge Decreased caregiver support no family in the area  and patietn with severe decreased tolerance    Co-evaluation               AM-PAC PT "6 Clicks" Mobility  Outcome Measure Help needed turning from your back to your side while in a flat bed without using bedrails?: A Little Help needed moving from lying on your back to sitting on the side of a flat bed without using bedrails?: A Little Help needed moving to and from a bed to a chair (including a wheelchair)?: A Little Help needed standing up from a chair using your arms (e.g., wheelchair or bedside chair)?: A Little Help needed to walk in hospital room?: A Lot Help needed climbing 3-5 steps with a railing? : Total 6 Click Score: 15    End of Session Equipment Utilized During Treatment: Gait belt Activity Tolerance: Patient tolerated treatment well Patient left: in bed;with call bell/phone within reach;with bed alarm set Nurse Communication: Mobility status PT Visit Diagnosis: Unsteadiness on feet (R26.81);Other abnormalities of gait and mobility (R26.89);Repeated falls (R29.6);Difficulty in walking, not elsewhere classified (R26.2);Muscle weakness (generalized) (M62.81);BPPV BPPV - Right/Left : Right    Time: 1035-1100 PT Time Calculation (min) (ACUTE ONLY): 25 min   Charges:   PT Evaluation $PT Eval  Moderate Complexity: 1 Mod PT Treatments $Therapeutic Activity: 23-37 mins       Shelton Silvas PT, DPT  Shelton Silvas 10/11/2019, 1:07 PM

## 2019-10-11 NOTE — Consult Note (Signed)
Reason for Consult: vertigo  Requesting Physician: Dr. Arbutus Ped   CC: vertigo     HPI: Peter Robertson is an 79 y.o. 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 On description patient states it is positional resolved when setting but worse with standing or moving.  No clear nystagmus.    Past Medical History:  Diagnosis Date  . Asthma   . BPH (benign prostatic hyperplasia)   . Chronic kidney disease    Kidney Stones  . Coronary artery disease   . Diabetes mellitus without complication ([place])   . Heart murmur   . Hyperlipidemia   . Hypertension   . Myocardial infarction [facility])     Past Surgical History:  Procedure Laterality Date  . COLONOSCOPY WITH PROPOFOL N/A 12/03/2014   Procedure: COLONOSCOPY WITH PROPOFOL;  Surgeon: Manya Silvas, MD;  Location: [facility] ENDOSCOPY;  Service: Endoscopy;  Laterality: N/A;  . CORONARY ARTERY BYPASS GRAFT    . LEFT HEART CATH AND CORS/GRAFTS ANGIOGRAPHY N/A 05/22/2018   Procedure: LEFT HEART CATH AND CORS/GRAFTS ANGIOGRAPHY poss PCI;  Surgeon: Minna Merritts, MD;  Location: [facility] CV LAB;  Service: Cardiovascular;  Laterality: N/A;    Family History  Problem Relation Age of Onset  . Diabetes Mother   . Cancer Mother   . Hypertension Father   . Hypertension Brother   . Diabetes Brother     Social History:  reports that he quit smoking about 35 years ago. He has never used smokeless tobacco. He reports that he does not drink alcohol or use drugs.  Allergies  Allergen Reactions  . Sulfamethoxazole-Trimethoprim Other (See Comments)    dizziness dizziness dizziness   . Tape Rash    blisters    Medications: I have reviewed the patient's current medications.  ROS: History obtained from the patient  General ROS: negative for - chills, fatigue, fever, night sweats, weight gain or weight  loss Psychological ROS: negative for - behavioral disorder, hallucinations, memory difficulties, mood swings or suicidal ideation Ophthalmic ROS: negative for - blurry vision, double vision, eye pain or loss of vision ENT ROS: negative for - epistaxis, nasal discharge, oral lesions, sore throat, tinnitus or vertigo Allergy and Immunology ROS: negative for - hives or itchy/watery eyes Hematological and Lymphatic ROS: negative for - bleeding problems, bruising or swollen lymph nodes Endocrine ROS: negative for - galactorrhea, hair pattern changes, polydipsia/polyuria or temperature intolerance Respiratory ROS: negative for - cough, hemoptysis, shortness of breath or wheezing Cardiovascular ROS: negative for - chest pain, dyspnea on exertion, edema or irregular heartbeat Gastrointestinal ROS: negative for - abdominal pain, diarrhea, hematemesis, nausea/vomiting or stool incontinence Genito-Urinary ROS: negative for - dysuria, hematuria, incontinence or urinary frequency/urgency Musculoskeletal ROS: negative for - joint swelling or muscular weakness Neurological ROS: as noted in HPI Dermatological ROS: negative for rash and skin lesion changes  Physical Examination: Blood pressure 105/68, pulse 62, temperature 98.2 F (36.8 C), temperature source Oral, resp. rate 18, height 5\' 6"  (1.676 m), weight 81.8 kg, SpO2 99 %.   Neurological Examination   Mental Status: Alert, oriented to name and place  Cranial Nerves: II: Discs flat bilaterally; Visual fields grossly normal, pupils equal, round, reactive to light and accommodation III,IV, VI: ptosis not present, extra-ocular motions intact bilaterally V,VII: smile symmetric, facial light touch sensation normal bilaterally VIII: hearing normal bilaterally IX,X: gag reflex present XI: bilateral shoulder shrug XII: midline  tongue extension Motor: Right : Upper extremity   5/5    Left:     Upper extremity   5/5  Lower extremity   5/5     Lower  extremity   5/5 Tone and bulk:normal tone throughout; no atrophy noted Sensory: Pinprick and light touch intact throughout, bilaterally Deep Tendon Reflexes: 1+ and symmetric throughout Plantars: Right: downgoing   Left: downgoing Cerebellar: normal finger-to-nose      Laboratory Studies:   Basic Metabolic Panel: Recent Labs  Lab 10/10/19 1354 10/11/19 0400  NA 138 134*  K 4.8 4.0  CL 105 102  CO2 25 23  GLUCOSE 201* 132*  BUN 32* 22  CREATININE 1.22 1.10  CALCIUM 9.1 8.9    Liver Function Tests: Recent Labs  Lab 10/10/19 1354  AST 15  ALT 13  ALKPHOS 95  BILITOT 1.0  PROT 7.5  ALBUMIN 4.3   Recent Labs  Lab 10/10/19 1354  LIPASE 27   No results for input(s): AMMONIA in the last 168 hours.  CBC: Recent Labs  Lab 10/10/19 1354 10/11/19 0400  WBC 7.8 9.8  NEUTROABS 6.5  --   HGB 11.6* 11.7*  HCT 34.4* 34.9*  MCV 82.9 83.1  PLT 99* 100*    Cardiac Enzymes: No results for input(s): CKTOTAL, CKMB, CKMBINDEX, TROPONINI in the last 168 hours.  BNP: Invalid input(s): POCBNP  CBG: Recent Labs  Lab 10/10/19 2115 10/11/19 0730 10/11/19 1138  GLUCAP 141* 101* 157*    Microbiology: Results for orders placed or performed during the hospital encounter of 10/10/19  SARS CORONAVIRUS 2 (TAT 6-24 HRS) Nasopharyngeal Nasopharyngeal Swab     Status: None   Collection Time: 10/10/19  6:46 PM   Specimen: Nasopharyngeal Swab  Result Value Ref Range Status   SARS Coronavirus 2 NEGATIVE NEGATIVE Final    Comment: (NOTE) SARS-CoV-2 target nucleic acids are NOT DETECTED. The SARS-CoV-2 RNA is generally detectable in upper and lower respiratory specimens during the acute phase of infection. Negative results do not preclude SARS-CoV-2 infection, do not rule out co-infections with other pathogens, and should not be used as the sole basis for treatment or other patient management decisions. Negative results must be combined with clinical observations, patient  history, and epidemiological information. The expected result is Negative. Fact Sheet for Patients: SugarRoll.be Fact Sheet for Healthcare Providers: https://www.woods-mathews.com/ This test is not yet approved or cleared by the [place] FDA and  has been authorized for detection and/or diagnosis of SARS-CoV-2 by FDA under an Emergency Use Authorization (EUA). This EUA will remain  in effect (meaning this test can be used) for the duration of the COVID-19 declaration under Section 56 4(b)(1) of the Act, 21 U.S.C. section 360bbb-3(b)(1), unless the authorization is terminated or revoked sooner. Performed at [facility], [place] [address], [place], [place] [address]     Coagulation Studies: No results for input(s): LABPROT, INR in the last 72 hours.  Urinalysis:  Recent Labs  Lab 10/10/19 1542  COLORURINE STRAW*  LABSPEC 1.011  PHURINE 6.0  GLUCOSEU 50*  HGBUR NEGATIVE  BILIRUBINUR NEGATIVE  KETONESUR NEGATIVE  PROTEINUR 100*  NITRITE NEGATIVE  LEUKOCYTESUR NEGATIVE    Lipid Panel:     Component Value Date/Time   CHOL 131 09/27/2013 0448   TRIG 291 (H) 09/27/2013 0448   HDL 22 (L) 09/27/2013 0448   VLDL 58 (H) 09/27/2013 0448   LDLCALC 51 09/27/2013 0448    HgbA1C:  Lab Results  Component Value Date  HGBA1C 8.4 (H) 09/27/2013    Urine Drug Screen:  No results found for: LABOPIA, COCAINSCRNUR, LABBENZ, AMPHETMU, THCU, LABBARB  Alcohol Level: No results for input(s): ETH in the last 168 hours.  Other results: EKG: normal EKG, normal sinus rhythm, unchanged from previous tracings.  Imaging: MR BRAIN WO CONTRAST  Result Date: 10/10/2019 CLINICAL DATA:  Vertigo, central. Additional history provided: Patient presents via EMS with fall, orthostatic upon arrival, diarrhea for 2 days. EXAM: MRI HEAD WITHOUT CONTRAST TECHNIQUE: Multiplanar, multiecho pulse sequences of the brain and surrounding structures were  obtained without intravenous contrast. COMPARISON:  Prior head CT examinations 04/07/2019 and earlier FINDINGS: Brain: Multiple sequences are motion degraded, limiting evaluation. Most notably there is moderate motion degradation of the sagittal T1 weighted sequence, moderate/severe motion degradation of the axial SWI sequence, severe motion degradation of the axial T1 weighted sequence and moderate motion degradation of the axial T2 weighted sequence. There is no evidence of acute infarct. No evidence of intracranial mass. No midline shift or extra-axial fluid collection. No chronic intracranial blood products. No significant white matter disease for age. There is mild generalized parenchymal atrophy. There is mild lateral and third ventriculomegaly which may be related to central predominant atrophy. A component of normal pressure hydrocephalus cannot be excluded. Vascular: Flow voids maintained within the proximal large arterial vessels. Skull and upper cervical spine: No focal marrow lesion. Ligamentous hypertrophy/pannus formation at the level of the skull base and C1-C2 articulation with mild narrowing of the craniocervical junction. Sinuses/Orbits: Visualized orbits demonstrate no acute abnormality. Trace ethmoid and maxillary sinus mucosal thickening at the imaged levels. No significant mastoid effusion. IMPRESSION: 1. Motion degraded examination as described. 2. No evidence of acute infarct. 3. Mild lateral and third ventriculomegaly may be related to central predominant atrophy. A component of normal pressure hydrocephalus cannot be excluded. 4. Ligamentous hypertrophy/pannus formation at the level of the skull base and C1-C2 articulation with resultant mild narrowing of the craniocervical junction. 5. Mild ethmoid and maxillary sinus mucosal thickening. Electronically Signed   By: Kellie Simmering DO   On: 10/10/2019 16:34     Assessment/Plan:  79 y.o. 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 On description patient states it is positional resolved when setting but worse with standing or moving.  No clear nystagmus.    1. Vertigo. - appears positional and prior history of it - don't think this is vertibro basillar insufficiency  2. CTH/MRI atrophy - I am not convinced this is NPH at this time as he has generalized atrophy. Does not present with typical tria.  - monitor progression  - possibly repeat CTH/MRI in the next 6 months.   10/11/2019, 12:40 PM

## 2019-10-11 NOTE — TOC Initial Note (Signed)
Transition of Care [facility]) - Initial/Assessment Note    Patient Details  Name: Peter Robertson MRN: [id number] Date of Birth: 1940-11-02  [facility]) CM/SW Contact:    [name], [place] Phone Number: 10/11/2019, 2:42 PM  Clinical Narrative: SW spoke with pt re PT/OT recommendation for SNF. Pt from home alone with his dog, neighbors assist as needed and are caring for pt's dog currently. Pt reports [facility] SN[facility] stay [facility] 1 year ago. Reviewed SNF placement process and answered questions. Will f/u with offers once available. Will need Candace Cruise submitted.  Wandra Feinstein, MSW, LCSW [phone] (Coverage)                      Expected Discharge Plan: [place] Barriers to Discharge: Continued Medical Work up, Ship broker   Patient Goals and CMS Choice   CMS Medicare.gov Compare Post Acute Care list provided to:: Patient Choice offered to / list presented to : Patient  Expected Discharge Plan and Services Expected Discharge Plan: [place] arrangements for the past 2 months: Single Family Home                                      Prior Living Arrangements/Services Living arrangements for the past 2 months: Single Family Home Lives with:: Self Patient language and need for interpreter reviewed:: No Do you feel safe going back to the place where you live?: Yes      Need for Family Participation in Patient Care: No (Comment) Care giver support system in place?: No (comment)   Criminal Activity/Legal Involvement Pertinent to Current Situation/Hospitalization: No - Comment as needed  Activities of Daily Living Home Assistive Devices/Equipment: Walker (specify type), Cane (specify quad or straight) ADL Screening (condition at time of admission) Patient's cognitive ability adequate to safely complete daily activities?: Yes Is the patient deaf or have difficulty  hearing?: No Does the patient have difficulty seeing, even when wearing glasses/contacts?: Yes Does the patient have difficulty concentrating, remembering, or making decisions?: No Patient able to express need for assistance with ADLs?: Yes Does the patient have difficulty dressing or bathing?: No Independently performs ADLs?: Yes (appropriate for developmental age) Does the patient have difficulty walking or climbing stairs?: Yes Weakness of Legs: Both Weakness of Arms/Hands: Both  Permission Sought/Granted Permission sought to share information with : Chartered certified accountant granted to share information with : Yes, Verbal Permission Granted              Emotional Assessment       Orientation: : Oriented to Self, Oriented to Place, Oriented to  Time, Oriented to Situation   Psych Involvement: No (comment)  Admission diagnosis:  BPPV (benign paroxysmal positional vertigo) [H81.10] Vertigo [R42] Patient Active Problem List   Diagnosis Date Noted  . Vertigo 10/11/2019  . BPPV (benign paroxysmal positional vertigo) 10/10/2019  . CAD (coronary artery disease) 10/10/2019  . Diabetes mellitus ([place]) 10/10/2019  . BPH (benign prostatic hyperplasia) 10/10/2019  . Thrombocytopenia ([place]) 10/10/2019  . Dark stools 10/10/2019  . Unstable angina ([place]) 05/20/2018  . Chest pain 01/30/2017   PCP:  Danae Orleans, MD Pharmacy:   [id number] - [place], [place] [address] [place] [address] Phone: [phone] Fax: [phone]     Social Determinants of  Health (SDOH) Interventions    Readmission Risk Interventions No flowsheet data found.

## 2019-10-11 NOTE — Progress Notes (Signed)
PROGRESS NOTE    Peter Robertson   [id number]  DOB: [birth year]  PCP: Danae Orleans, MD    DOA: 10/10/2019 LOS: 0   Brief Narrative   Peter Robertson is a 79 y.o. 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   His dizziness is aggravated by change in position, described as room spinning.  Patient has had vertigo for approximately one year, being followed by PCP and had reportedly done vestibular rehab.  Patient's additional complaint on admission is dark stools for about one week.  He is on ASA & Plavix, but no other NSAIDs.  No history of GI bleeding.  Prior colonoscopy in 2016 with polyps.   Admitted to hospitalist service for further evaluation and management.     Assessment & Plan   Active Problems:   BPPV (benign paroxysmal positional vertigo)   CAD (coronary artery disease)   Diabetes mellitus (HCC)   BPH (benign prostatic hyperplasia)   Thrombocytopenia (HCC)   Dark stools   Vertigo   Vertigo - long-standing. Provoked by position changes, particularly with turning head suggests BPPV.  MRI today not suggestive of a central process, enlarged appearing ventricles most likely is central atrophy, age-related. Patient without confusion or urinary incontinence to suggest NPH.  Unclear to what extent pt has participated in vestibular rehab.  Neuro consulted, advise outpatient eval once able to discharge.  --PT/OT evaluations --Meclizine BID for now, can increase as tolerated --consider low dose valium if needed, but hope to avoid  Inability to Ambulate - secondary to above. Patient lives alone, has limited help of a neighbor, but in current condition would require 24 hour supervision and assistance to return home safely.  Continue  working with PT.  May require SNF placement if not improving.  Dark stools - on admission, pt reported one week of tarry dark stools at home. Hgb is stable. Polyps seen on last colonoscopy 5 years ago. --monitor CBC --FOBT still pending --Hold ASA and Plavix   Coronary Artery Disease - stable, asymptomatic --Continue home Lipitor, Lasix, Imdur --Hold DAPT as above  Type 2 Diabetes - last A1c is from 2015, 8.4% at that time.   --follow up repeat A1c --Lantus 24 units qHS with sliding scale Novolog  Thrombocytopenia - stable at 99. Had heme eval that advised no further evaluation and instead monitoring  Essential hypertension - uncontrolled on admission.   --resume home Toprol, Lasix, Imdur --PRN oral hydralazine  Patient BMI: Body mass index is 29.11 kg/m.   DVT prophylaxis: scd's  Diet:  Diet Orders (From admission, onward)    Start     Ordered   10/10/19 2023  Diet regular Room service appropriate? Yes; Fluid consistency: Thin  Diet effective now    Question Answer Comment  Room service appropriate? Yes   Fluid consistency: Thin      10/10/19 2022            Code Status: Full Code    Subjective 10/11/19    Patient seen up in chair just after working with PT.  He says he felt very dizzy on standing to go from bed to chair.  Did not walk farther for safety.  He reports some abdominal wall pain, from one of his falls but denies other complaints besides the dizziness  Disposition Plan & Communication   Dispo & Barriers: patient unable to ambulate due to vertigo/dizziness.  Continues to require inpatient care until safe to discharge, currently requiring 24 hour supervision/assistance with all mobility.  May require SNF if not improving. Coming from: home, lives alone Exp d/c date: 4/25  Medically stable for d/c? no  Family Communication: none at bedside during encounter, will attempt to call    Consults, Procedures, Significant Events   Consultants:    Neurology  Procedures:   None  Antimicrobials:   None     Objective   Vitals:   10/11/19 0030 10/11/19 0417 10/11/19 0913 10/11/19 1140  BP: (!) 148/81 (!) 144/69 138/67 105/68  Pulse: 80 77 76 62  Resp: 20 18 20 18   Temp: 97.9 F (36.6 C) 99.3 F (37.4 C) 98.3 F (36.8 C) 98.2 F (36.8 C)  TempSrc: Oral Oral Oral Oral  SpO2: 97% 98% 98% 99%  Weight:      Height:        Intake/Output Summary (Last 24 hours) at 10/11/2019 1231 Last data filed at 10/11/2019 1018 Gross per 24 hour  Intake 623 ml  Output 500 ml  Net 123 ml   Filed Weights   10/10/19 1351 10/10/19 2023  Weight: 81.6 kg 81.8 kg    Physical Exam:  General exam: awake, alert, no acute distress HEENT: EOMI, no nystagmus, atraumatic, clear conjunctiva, moist mucus membranes, hearing grossly normal  Respiratory system: CTAB, no wheezes, rales or rhonchi, normal respiratory effort. Cardiovascular system: normal S1/S2, RRR, no pedal edema.   Gastrointestinal system: superficial tenderness with voluntary guarding at lower/mid abdomen, no ecchymosis or swelling seen, non-distended, +bowel sounds. Central nervous system: A&O x4. no gross focal neurologic deficits, normal speech Extremities: moves all, normal tone, b/l UE's hyperpigmented Psychiatry: normal mood, congruent affect, judgement and insight appear normal  Labs   Data Reviewed: I have personally reviewed following labs and imaging studies  CBC: Recent Labs  Lab 10/10/19 1354 10/11/19 0400  WBC 7.8 9.8  NEUTROABS 6.5  --   HGB 11.6* 11.7*  HCT 34.4* 34.9*  MCV 82.9 83.1  PLT 99* 123XX123*   Basic Metabolic Panel: Recent Labs  Lab 10/10/19 1354 10/11/19 0400  NA 138 134*  K 4.8 4.0  CL 105 102  CO2 25 23  GLUCOSE 201* 132*  BUN 32* 22  CREATININE 1.22 1.10  CALCIUM 9.1 8.9   GFR: Estimated Creatinine Clearance: 54.7 mL/min (by C-G formula based on SCr of 1.1 mg/dL). Liver Function Tests: Recent Labs  Lab 10/10/19 1354  AST  15  ALT 13  ALKPHOS 95  BILITOT 1.0  PROT 7.5  ALBUMIN 4.3   Recent Labs  Lab 10/10/19 1354  LIPASE 27   No results for input(s): AMMONIA in the last 168 hours. Coagulation Profile: No results for input(s): INR, PROTIME in the last 168 hours. Cardiac Enzymes: No results for input(s): CKTOTAL, CKMB, CKMBINDEX, TROPONINI in the last 168 hours. BNP (last 3 results) No results for input(s): PROBNP in the last 8760 hours. HbA1C: No results for input(s): HGBA1C in the last 72 hours. CBG: Recent Labs  Lab 10/10/19 2115 10/11/19 0730 10/11/19 1138  GLUCAP 141* 101* 157*   Lipid Profile: No results for input(s): CHOL, HDL, LDLCALC, TRIG, CHOLHDL, LDLDIRECT in the last 72 hours. Thyroid Function Tests: No results for input(s): TSH, T4TOTAL, FREET4, T3FREE, THYROIDAB in the last 72 hours. Anemia Panel: No results for input(s): VITAMINB12, FOLATE, FERRITIN, TIBC, IRON, RETICCTPCT in the  last 72 hours. Sepsis Labs: Recent Labs  Lab 10/10/19 1359  LATICACIDVEN 0.9    Recent Results (from the past 240 hour(s))  SARS CORONAVIRUS 2 (TAT 6-24 HRS) Nasopharyngeal Nasopharyngeal Swab     Status: None   Collection Time: 10/10/19  6:46 PM   Specimen: Nasopharyngeal Swab  Result Value Ref Range Status   SARS Coronavirus 2 NEGATIVE NEGATIVE Final    Comment: (NOTE) SARS-CoV-2 target nucleic acids are NOT DETECTED. The SARS-CoV-2 RNA is generally detectable in upper and lower respiratory specimens during the acute phase of infection. Negative results do not preclude SARS-CoV-2 infection, do not rule out co-infections with other pathogens, and should not be used as the sole basis for treatment or other patient management decisions. Negative results must be combined with clinical observations, patient history, and epidemiological information. The expected result is Negative. Fact Sheet for Patients: SugarRoll.be Fact Sheet for Healthcare  Providers: https://www.woods-mathews.com/ This test is not yet approved or cleared by the [place] FDA and  has been authorized for detection and/or diagnosis of SARS-CoV-2 by FDA under an Emergency Use Authorization (EUA). This EUA will remain  in effect (meaning this test can be used) for the duration of the COVID-19 declaration under Section 56 4(b)(1) of the Act, 21 U.S.C. section 360bbb-3(b)(1), unless the authorization is terminated or revoked sooner. Performed at [facility], [place] [address] [place], [place] [address]       Imaging Studies   MR BRAIN WO CONTRAST  Result Date: 10/10/2019 CLINICAL DATA:  Vertigo, central. Additional history provided: Patient presents via EMS with fall, orthostatic upon arrival, diarrhea for 2 days. EXAM: MRI HEAD WITHOUT CONTRAST TECHNIQUE: Multiplanar, multiecho pulse sequences of the brain and surrounding structures were obtained without intravenous contrast. COMPARISON:  Prior head CT examinations 04/07/2019 and earlier FINDINGS: Brain: Multiple sequences are motion degraded, limiting evaluation. Most notably there is moderate motion degradation of the sagittal T1 weighted sequence, moderate/severe motion degradation of the axial SWI sequence, severe motion degradation of the axial T1 weighted sequence and moderate motion degradation of the axial T2 weighted sequence. There is no evidence of acute infarct. No evidence of intracranial mass. No midline shift or extra-axial fluid collection. No chronic intracranial blood products. No significant white matter disease for age. There is mild generalized parenchymal atrophy. There is mild lateral and third ventriculomegaly which may be related to central predominant atrophy. A component of normal pressure hydrocephalus cannot be excluded. Vascular: Flow voids maintained within the proximal large arterial vessels. Skull and upper cervical spine: No focal marrow lesion. Ligamentous  hypertrophy/pannus formation at the level of the skull base and C1-C2 articulation with mild narrowing of the craniocervical junction. Sinuses/Orbits: Visualized orbits demonstrate no acute abnormality. Trace ethmoid and maxillary sinus mucosal thickening at the imaged levels. No significant mastoid effusion. IMPRESSION: 1. Motion degraded examination as described. 2. No evidence of acute infarct. 3. Mild lateral and third ventriculomegaly may be related to central predominant atrophy. A component of normal pressure hydrocephalus cannot be excluded. 4. Ligamentous hypertrophy/pannus formation at the level of the skull base and C1-C2 articulation with resultant mild narrowing of the craniocervical junction. 5. Mild ethmoid and maxillary sinus mucosal thickening. Electronically Signed   By: Kellie Simmering DO   On: 10/10/2019 16:34     Medications   Scheduled Meds: . atorvastatin  80 mg Oral QHS  . furosemide  20 mg Oral QODAY  . insulin aspart  0-15 Units Subcutaneous TID WC  . insulin glargine  24 Units Subcutaneous QHS  . isosorbide mononitrate  60 mg Oral Daily  . meclizine  50 mg Oral BID  . metoprolol succinate  150 mg Oral QHS  . pantoprazole  40 mg Oral Daily  . sertraline  150 mg Oral QHS  . sodium chloride flush  3 mL Intravenous Q12H  . tamsulosin  0.4 mg Oral QHS   Continuous Infusions: . sodium chloride         LOS: 0 days    Time spent: 30 minutes    Ezekiel Slocumb, DO [facility]   If 7PM-7AM, please contact night-coverage [email or web address] 10/11/2019, 12:31 PM

## 2019-10-12 LAB — GLUCOSE, CAPILLARY
Glucose-Capillary: 177 mg/dL — ABNORMAL HIGH (ref 70–99)
Glucose-Capillary: 151 mg/dL — ABNORMAL HIGH (ref 70–99)
Glucose-Capillary: 73 mg/dL (ref 70–99)
Glucose-Capillary: 126 mg/dL — ABNORMAL HIGH (ref 70–99)
Glucose-Capillary: 242 mg/dL — ABNORMAL HIGH (ref 70–99)

## 2019-10-12 LAB — HEMOGLOBIN A1C
Hgb A1c MFr Bld: 7.2 % — ABNORMAL HIGH (ref 4.8–5.6)
Mean Plasma Glucose: 159.94 mg/dL

## 2019-10-12 MED ORDER — DIAZEPAM 2 MG PO TABS
2.0000 mg | ORAL_TABLET | Freq: Two times a day (BID) | ORAL | Status: DC
  Administered 2019-10-13 – 2019-10-15 (×5): 2 mg via ORAL
  Filled 2019-10-12 (×5): qty 1

## 2019-10-12 NOTE — Progress Notes (Signed)
PROGRESS NOTE    Peter Robertson   [id number]  DOB: [birth year]  PCP: Danae Orleans, MD    DOA: 10/10/2019 LOS: 1   Brief Narrative   Peter Robertson is a 79 y.o. 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   His dizziness is aggravated by change in position, described as room spinning.  Patient has had vertigo for approximately one year, being followed by PCP and had reportedly done vestibular rehab.  Patient's additional complaint on admission is dark stools for about one week.  He is on ASA & Plavix, but no other NSAIDs.  No history of GI bleeding.  Prior colonoscopy in 2016 with polyps.   Admitted to hospitalist service for further evaluation and management.     Assessment & Plan   Active Problems:   BPPV (benign paroxysmal positional vertigo)   CAD (coronary artery disease)   Diabetes mellitus (HCC)   BPH (benign prostatic hyperplasia)   Thrombocytopenia (HCC)   Dark stools   Vertigo   Vertigo - long-standing. Provoked by position changes, particularly with turning head suggests BPPV.  MRI today not suggestive of a central process, enlarged appearing ventricles most likely is central atrophy, age-related. Patient without confusion or urinary incontinence to suggest NPH.  Unclear to what extent pt has participated in vestibular rehab but patient reports everything they tried made him vomit.   Neuro consulted, advise outpatient eval once able to discharge.  --PT/OT  --Meclizine BID for now, can increase as tolerated  --will try low dose Valium BID since no relief with meclizine  Inability to Ambulate - secondary to above. Patient lives alone, has limited help of a neighbor, but in current condition would require 24 hour  supervision and assistance to return home safely.  Continue working with PT.  SNF recommended.  TOC started process.  Dark stools - on admission, pt reported one week of tarry dark stools at home. Hgb is stable. Polyps seen on last colonoscopy 5 years ago.   --monitor CBC --FOBT still pending --Hold ASA and Plavix   Coronary Artery Disease - stable, asymptomatic --Continue home Lipitor, Lasix, Imdur --Hold DAPT as above  Type 2 Diabetes - last A1c is from 2015, 8.4% at that time.   --follow up repeat A1c --Lantus 24 units qHS with sliding scale Novolog  Thrombocytopenia - stable at 99. Had heme eval that advised no further evaluation and instead monitoring  Essential hypertension - uncontrolled on admission.   --resume home Toprol, Lasix, Imdur --PRN oral hydralazine  Patient BMI: Body mass index is 29.11 kg/m.   DVT prophylaxis: scd's  Diet:  Diet Orders (From admission, onward)    Start     Ordered   10/10/19 2023  Diet regular Room service appropriate? Yes; Fluid consistency: Thin  Diet effective now    Question Answer Comment  Room service appropriate? Yes   Fluid consistency: Thin      10/10/19 2022            Code Status: Full Code    Subjective 10/12/19    Patient seen at bedside this AM.  He says his dizziness is unchanged.  No other complaints including fever/chills, headache, vision changes, N/V/D, CP, SOB.  No acute events.  He is agreeable to rehab.   Disposition  Plan & Communication   Dispo & Barriers: patient unable to ambulate due to vertigo/dizziness.  Continues to require inpatient care until safe to discharge, currently requiring 24 hour supervision/assistance with all mobility.  SNF placement pending. Coming from: home, lives alone Exp d/c date: 4/26 Medically stable for d/c? no  Family Communication: none at bedside during encounter, will attempt to call    Consults, Procedures, Significant Events   Consultants:    Neurology  Procedures:   None  Antimicrobials:   None     Objective   Vitals:   10/11/19 1140 10/11/19 1930 10/11/19 2255 10/12/19 0356  BP: 105/68 118/73 (!) 146/69 (!) 121/57  Pulse: 62 84 69 87  Resp: 18 18  18   Temp: 98.2 F (36.8 C) 98.7 F (37.1 C)  98.7 F (37.1 C)  TempSrc: Oral Oral  Oral  SpO2: 99% 97%  96%  Weight:      Height:        Intake/Output Summary (Last 24 hours) at 10/12/2019 0748 Last data filed at 10/12/2019 0539 Gross per 24 hour  Intake 483 ml  Output 1000 ml  Net -517 ml   Filed Weights   10/10/19 1351 10/10/19 2023  Weight: 81.6 kg 81.8 kg    Physical Exam:  General exam: awake, alert, no acute distress HEENT: EOMI, no nystagmus, moist mucus membranes, hearing grossly normal  Respiratory system: CTAB, no wheezes, rales or rhonchi, normal respiratory effort. Cardiovascular system: normal S1/S2, RRR, no pedal edema.   Central nervous system: A&O x4. no gross focal neurologic deficits, normal speech Extremities: moves all, normal tone, b/l UE's hyperpigmented Psychiatry: normal mood, congruent affect, judgement and insight appear normal  Labs   Data Reviewed: I have personally reviewed following labs and imaging studies  CBC: Recent Labs  Lab 10/10/19 1354 10/11/19 0400  WBC 7.8 9.8  NEUTROABS 6.5  --   HGB 11.6* 11.7*  HCT 34.4* 34.9*  MCV 82.9 83.1  PLT 99* 123XX123*   Basic Metabolic Panel: Recent Labs  Lab 10/10/19 1354 10/11/19 0400  NA 138 134*  K 4.8 4.0  CL 105 102  CO2 25 23  GLUCOSE 201* 132*  BUN 32* 22  CREATININE 1.22 1.10  CALCIUM 9.1 8.9   GFR: Estimated Creatinine Clearance: 54.7 mL/min (by C-G formula based on SCr of 1.1 mg/dL). Liver Function Tests: Recent Labs  Lab 10/10/19 1354  AST 15  ALT 13  ALKPHOS 95  BILITOT 1.0  PROT 7.5  ALBUMIN 4.3   Recent Labs  Lab 10/10/19 1354  LIPASE 27   No results for input(s): AMMONIA in the last 168 hours. Coagulation Profile: No results for  input(s): INR, PROTIME in the last 168 hours. Cardiac Enzymes: No results for input(s): CKTOTAL, CKMB, CKMBINDEX, TROPONINI in the last 168 hours. BNP (last 3 results) No results for input(s): PROBNP in the last 8760 hours. HbA1C: No results for input(s): HGBA1C in the last 72 hours. CBG: Recent Labs  Lab 10/11/19 0730 10/11/19 1138 10/11/19 1644 10/11/19 2107 10/12/19 0740  GLUCAP 101* 157* 179* 226* 177*   Lipid Profile: No results for input(s): CHOL, HDL, LDLCALC, TRIG, CHOLHDL, LDLDIRECT in the last 72 hours. Thyroid Function Tests: No results for input(s): TSH, T4TOTAL, FREET4, T3FREE, THYROIDAB in the last 72 hours. Anemia Panel: No results for input(s): VITAMINB12, FOLATE, FERRITIN, TIBC, IRON, RETICCTPCT in the last 72 hours. Sepsis Labs: Recent Labs  Lab 10/10/19 1359  LATICACIDVEN 0.9    Recent Results (from the past 240  hour(s))  SARS CORONAVIRUS 2 (TAT 6-24 HRS) Nasopharyngeal Nasopharyngeal Swab     Status: None   Collection Time: 10/10/19  6:46 PM   Specimen: Nasopharyngeal Swab  Result Value Ref Range Status   SARS Coronavirus 2 NEGATIVE NEGATIVE Final    Comment: (NOTE) SARS-CoV-2 target nucleic acids are NOT DETECTED. The SARS-CoV-2 RNA is generally detectable in upper and lower respiratory specimens during the acute phase of infection. Negative results do not preclude SARS-CoV-2 infection, do not rule out co-infections with other pathogens, and should not be used as the sole basis for treatment or other patient management decisions. Negative results must be combined with clinical observations, patient history, and epidemiological information. The expected result is Negative. Fact Sheet for Patients: SugarRoll.be Fact Sheet for Healthcare Providers: https://www.woods-mathews.com/ This test is not yet approved or cleared by the [place] FDA and  has been authorized for detection and/or diagnosis of  SARS-CoV-2 by FDA under an Emergency Use Authorization (EUA). This EUA will remain  in effect (meaning this test can be used) for the duration of the COVID-19 declaration under Section 56 4(b)(1) of the Act, 21 U.S.C. section 360bbb-3(b)(1), unless the authorization is terminated or revoked sooner. Performed at [facility], [place] [address], [place], [place] [address]       Imaging Studies   MR BRAIN WO CONTRAST  Result Date: 10/10/2019 CLINICAL DATA:  Vertigo, central. Additional history provided: Patient presents via EMS with fall, orthostatic upon arrival, diarrhea for 2 days. EXAM: MRI HEAD WITHOUT CONTRAST TECHNIQUE: Multiplanar, multiecho pulse sequences of the brain and surrounding structures were obtained without intravenous contrast. COMPARISON:  Prior head CT examinations 04/07/2019 and earlier FINDINGS: Brain: Multiple sequences are motion degraded, limiting evaluation. Most notably there is moderate motion degradation of the sagittal T1 weighted sequence, moderate/severe motion degradation of the axial SWI sequence, severe motion degradation of the axial T1 weighted sequence and moderate motion degradation of the axial T2 weighted sequence. There is no evidence of acute infarct. No evidence of intracranial mass. No midline shift or extra-axial fluid collection. No chronic intracranial blood products. No significant white matter disease for age. There is mild generalized parenchymal atrophy. There is mild lateral and third ventriculomegaly which may be related to central predominant atrophy. A component of normal pressure hydrocephalus cannot be excluded. Vascular: Flow voids maintained within the proximal large arterial vessels. Skull and upper cervical spine: No focal marrow lesion. Ligamentous hypertrophy/pannus formation at the level of the skull base and C1-C2 articulation with mild narrowing of the craniocervical junction. Sinuses/Orbits: Visualized orbits demonstrate no acute  abnormality. Trace ethmoid and maxillary sinus mucosal thickening at the imaged levels. No significant mastoid effusion. IMPRESSION: 1. Motion degraded examination as described. 2. No evidence of acute infarct. 3. Mild lateral and third ventriculomegaly may be related to central predominant atrophy. A component of normal pressure hydrocephalus cannot be excluded. 4. Ligamentous hypertrophy/pannus formation at the level of the skull base and C1-C2 articulation with resultant mild narrowing of the craniocervical junction. 5. Mild ethmoid and maxillary sinus mucosal thickening. Electronically Signed   By: Kellie Simmering DO   On: 10/10/2019 16:34     Medications   Scheduled Meds: . atorvastatin  80 mg Oral QHS  . furosemide  20 mg Oral QODAY  . insulin aspart  0-15 Units Subcutaneous TID WC  . insulin glargine  24 Units Subcutaneous QHS  . isosorbide mononitrate  60 mg Oral Daily  . meclizine  50 mg Oral BID  .  metoprolol succinate  150 mg Oral QHS  . pantoprazole  40 mg Oral Daily  . sertraline  150 mg Oral QHS  . sodium chloride flush  3 mL Intravenous Q12H  . tamsulosin  0.4 mg Oral QHS   Continuous Infusions: . sodium chloride         LOS: 1 day    Time spent: 25 minutes    Ezekiel Slocumb, DO [facility]   If 7PM-7AM, please contact night-coverage [email or web address] 10/12/2019, 7:48 AM

## 2019-10-13 LAB — CBC
HCT: 34.2 % — ABNORMAL LOW (ref 39.0–52.0)
Hemoglobin: 11.4 g/dL — ABNORMAL LOW (ref 13.0–17.0)
MCH: 27.9 pg (ref 26.0–34.0)
MCHC: 33.3 g/dL (ref 30.0–36.0)
MCV: 83.8 fL (ref 80.0–100.0)
Platelets: 113 10*3/uL — ABNORMAL LOW (ref 150–400)
RBC: 4.08 MIL/uL — ABNORMAL LOW (ref 4.22–5.81)
RDW: 15.3 % (ref 11.5–15.5)
WBC: 7.2 10*3/uL (ref 4.0–10.5)
nRBC: 0.4 % — ABNORMAL HIGH (ref 0.0–0.2)

## 2019-10-13 LAB — GLUCOSE, CAPILLARY
Glucose-Capillary: 139 mg/dL — ABNORMAL HIGH (ref 70–99)
Glucose-Capillary: 381 mg/dL — ABNORMAL HIGH (ref 70–99)
Glucose-Capillary: 88 mg/dL (ref 70–99)
Glucose-Capillary: 212 mg/dL — ABNORMAL HIGH (ref 70–99)

## 2019-10-13 NOTE — TOC Progression Note (Signed)
Transition of Care [facility]) - Progression Note    Patient Details  Name: Peter Robertson MRN: [id number] Date of Birth: [birth year]  Transition of Care [facility]) CM/SW [place], LCSW Phone Number: 10/13/2019, 10:13 AM  Clinical Narrative: CSW followed up with patient regarding bed offers: [facility], [facility], and [facility]. [place] and [facility] are still pending. Patient said he definitely doesn't want [facility]. He asked CSW to call his neighbor to discuss options and get her opinion on them. CSW called Ms. Welch to review. She definitely does not want [facility]. She will research other options and follow up regarding decision.  Expected Discharge Plan: [place] Barriers to Discharge: Continued Medical Work up, Ship broker  Expected Discharge Plan and Services Expected Discharge Plan: [place] arrangements for the past 2 months: Single Family Home                                       Social Determinants of Health (SDOH) Interventions    Readmission Risk Interventions No flowsheet data found.

## 2019-10-13 NOTE — TOC Progression Note (Signed)
Transition of Care [facility]) - Progression Note    Patient Details  Name: Peter Robertson MRN: [id number] Date of Birth: [birth year]  Transition of Care [facility]) CM/SW Contact  Shelbie Ammons, RN Phone Number: 10/13/2019, 3:48 PM  Clinical Narrative:   Patient chooses [facility], spoke with Magda Paganini and she will start insurance authorization.     Expected Discharge Plan: [place] Barriers to Discharge: Continued Medical Work up, Ship broker  Expected Discharge Plan and Services Expected Discharge Plan: [place] arrangements for the past 2 months: Single Family Home                                       Social Determinants of Health (SDOH) Interventions    Readmission Risk Interventions No flowsheet data found.

## 2019-10-13 NOTE — Progress Notes (Signed)
Inpatient Diabetes Program Recommendations  AACE/ADA: New Consensus Statement on Inpatient Glycemic Control (2015)  Target Ranges:  Prepandial:   less than 140 mg/dL      Peak postprandial:   less than 180 mg/dL (1-2 hours)      Critically ill patients:  140 - 180 mg/dL   Results for FYNN, REVES (MRN [id number]) as of 10/13/2019 13:27  Ref. Range 10/12/2019 07:40 10/12/2019 08:44 10/12/2019 11:47 10/12/2019 16:53 10/12/2019 21:20  Glucose-Capillary Latest Ref Range: 70 - 99 mg/dL 177 (H) 151 (H) 73 126 (H) 242 (H)   Results for BENTO, GANTERT (MRN [id number]) as of 10/13/2019 13:27  Ref. Range 10/13/2019 07:41 10/13/2019 11:46  Glucose-Capillary Latest Ref Range: 70 - 99 mg/dL 139 (H) 381 (H)    Admit with: Vertigo  History: DM, CKD  Home DM Meds: Lantus 24 units QHS       Regular SSI       Metformin 1000 mg BID  Current Orders: Lantus 24 units QHS      Novolog Moderate Correction Scale/ SSI (0-15 units) TID AC    MD- Note CBG 381 mg/dl today at 12pm.  If this trend continues, may consider adding Novolog Meal Coverage:   Novolog 4 units TID with meals  (Please add the following Hold Parameters: Hold if pt eats <50% of meal, Hold if pt NPO)    --Will follow patient during hospitalization--  Wyn Quaker RN, MSN, CDE Diabetes Coordinator Inpatient Glycemic Control Team Team Pager: [phone] (8a-5p)

## 2019-10-13 NOTE — Progress Notes (Signed)
PROGRESS NOTE    Peter Robertson   [id number]  DOB: 06/19/1941  PCP: Danae Orleans, MD    DOA: 10/10/2019 LOS: 2   Brief Narrative   Peter Robertson is a 79 y.o. 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   His dizziness is aggravated by change in position, described as room spinning.  Patient has had vertigo for approximately one year, being followed by PCP and had reportedly done vestibular rehab.  Patient's additional complaint on admission is dark stools for about one week.  He is on ASA & Plavix, but no other NSAIDs.  No history of GI bleeding.  Prior colonoscopy in 2016 with polyps.   Admitted to hospitalist service for further evaluation and management.     Assessment & Plan   Active Problems:   BPPV (benign paroxysmal positional vertigo)   CAD (coronary artery disease)   Diabetes mellitus (HCC)   BPH (benign prostatic hyperplasia)   Thrombocytopenia (HCC)   Dark stools   Vertigo   Vertigo - long-standing. Provoked by position changes, particularly with turning head suggests BPPV.  MRI today not suggestive of a central process, enlarged appearing ventricles most likely is central atrophy, age-related. Patient without confusion or urinary incontinence to suggest NPH.  Unclear to what extent pt has participated in vestibular rehab but patient reports everything they tried made him vomit.  Declines to try Epley maneuver. Neuro consulted, advise outpatient eval once able to discharge.  --PT/OT: recommend SNF --Meclizine BID for now, can increase as tolerated  --trial of low dose Valium BID since no relief with meclizine  Inability to Ambulate - secondary to above. Patient lives alone, has limited help of a neighbor, but in  current condition would require 24 hour supervision and assistance to return home safely.  Continue working with PT.  SNF recommended.  TOC started process.  Dark stools - on admission, pt reported one week of tarry dark stools at home. Hgb is stable. Polyps seen on last colonoscopy 5 years ago.   --monitor CBC --FOBT still pending --Hold ASA and Plavix   Coronary Artery Disease - stable, asymptomatic --Continue home Lipitor, Lasix, Imdur --Hold DAPT as above  Type 2 Diabetes - last A1c is from 2015, 8.4% at that time.   --follow up repeat A1c --Lantus 24 units qHS with sliding scale Novolog  Thrombocytopenia - stable at 99. Had heme eval that advised no further evaluation and instead monitoring  Essential hypertension - uncontrolled on admission.   --resume home Toprol, Lasix, Imdur --PRN oral hydralazine  Patient BMI: Body mass index is 29.11 kg/m.   DVT prophylaxis: scd's  Diet:  Diet Orders (From admission, onward)    Start     Ordered   10/10/19 2023  Diet regular Room service appropriate? Yes; Fluid consistency: Thin  Diet effective now    Question Answer Comment  Room service appropriate? Yes   Fluid consistency: Thin      10/10/19 2022            Code Status: Full Code    Subjective 10/13/19    Patient seen up in chair this AM.  Still feels dizzy, felt nauseated but didn't let himself vomit when he got up from bed to chair earlier this AM.  No other complaints  including fever/chills, headache, vision changes, N/V/D, CP, SOB.  No acute events reported.     Disposition Plan & Communication   Dispo & Barriers: patient unable to ambulate due to vertigo/dizziness and lives alone with limited help from a neighbor.  Continues to require inpatient care until safe to discharge, currently requiring 24 hour supervision/assistance with all mobility.  SNF placement pending. Coming from: home, lives alone Exp d/c date: 4/26 Medically stable for d/c? no  Family  Communication: none at bedside during encounter, will attempt to call    Consults, Procedures, Significant Events   Consultants:   Neurology  Procedures:   None  Antimicrobials:   None     Objective   Vitals:   10/12/19 1251 10/12/19 1939 10/13/19 0441 10/13/19 0806  BP: 101/62 131/63 109/61 122/65  Pulse: 66 63 (!) 53 (!) 53  Resp: 17 16 18    Temp: 98.2 F (36.8 C) 97.6 F (36.4 C) 98.2 F (36.8 C)   TempSrc: Oral Oral Oral   SpO2: 97% 94% 95%   Weight:      Height:        Intake/Output Summary (Last 24 hours) at 10/13/2019 1129 Last data filed at 10/13/2019 1000 Gross per 24 hour  Intake 840 ml  Output 900 ml  Net -60 ml   Filed Weights   10/10/19 1351 10/10/19 2023  Weight: 81.6 kg 81.8 kg    Physical Exam:  General exam: awake, alert, no acute distress Respiratory system: CTAB, no wheezes, rales or rhonchi, normal respiratory effort. Cardiovascular system: normal S1/S2, RRR, no pedal edema.   Central nervous system: A&O x4. no gross focal neurologic deficits, normal speech   Labs   Data Reviewed: I have personally reviewed following labs and imaging studies  CBC: Recent Labs  Lab 10/10/19 1354 10/11/19 0400 10/13/19 0629  WBC 7.8 9.8 7.2  NEUTROABS 6.5  --   --   HGB 11.6* 11.7* 11.4*  HCT 34.4* 34.9* 34.2*  MCV 82.9 83.1 83.8  PLT 99* 100* [id number]*   Basic Metabolic Panel: Recent Labs  Lab 10/10/19 1354 10/11/19 0400  NA 138 134*  K 4.8 4.0  CL 105 102  CO2 25 23  GLUCOSE 201* 132*  BUN 32* 22  CREATININE 1.22 1.10  CALCIUM 9.1 8.9   GFR: Estimated Creatinine Clearance: 53.8 mL/min (by C-G formula based on SCr of 1.1 mg/dL). Liver Function Tests: Recent Labs  Lab 10/10/19 1354  AST 15  ALT 13  ALKPHOS 95  BILITOT 1.0  PROT 7.5  ALBUMIN 4.3   Recent Labs  Lab 10/10/19 1354  LIPASE 27   No results for input(s): AMMONIA in the last 168 hours. Coagulation Profile: No results for input(s): INR, PROTIME in the last 168  hours. Cardiac Enzymes: No results for input(s): CKTOTAL, CKMB, CKMBINDEX, TROPONINI in the last 168 hours. BNP (last 3 results) No results for input(s): PROBNP in the last 8760 hours. HbA1C: Recent Labs    10/10/19 1354  HGBA1C 7.2*   CBG: Recent Labs  Lab 10/12/19 0844 10/12/19 1147 10/12/19 1653 10/12/19 2120 10/13/19 0741  GLUCAP 151* 73 126* 242* 139*   Lipid Profile: No results for input(s): CHOL, HDL, LDLCALC, TRIG, CHOLHDL, LDLDIRECT in the last 72 hours. Thyroid Function Tests: No results for input(s): TSH, T4TOTAL, FREET4, T3FREE, THYROIDAB in the last 72 hours. Anemia Panel: No results for input(s): VITAMINB12, FOLATE, FERRITIN, TIBC, IRON, RETICCTPCT in the last 72 hours. Sepsis Labs: Recent Labs  Lab 10/10/19 1359  LATICACIDVEN 0.9    Recent Results (from the past 240 hour(s))  SARS CORONAVIRUS 2 (TAT 6-24 HRS) Nasopharyngeal Nasopharyngeal Swab     Status: None   Collection Time: 10/10/19  6:46 PM   Specimen: Nasopharyngeal Swab  Result Value Ref Range Status   SARS Coronavirus 2 NEGATIVE NEGATIVE Final    Comment: (NOTE) SARS-CoV-2 target nucleic acids are NOT DETECTED. The SARS-CoV-2 RNA is generally detectable in upper and lower respiratory specimens during the acute phase of infection. Negative results do not preclude SARS-CoV-2 infection, do not rule out co-infections with other pathogens, and should not be used as the sole basis for treatment or other patient management decisions. Negative results must be combined with clinical observations, patient history, and epidemiological information. The expected result is Negative. Fact Sheet for Patients: SugarRoll.be Fact Sheet for Healthcare Providers: https://www.woods-mathews.com/ This test is not yet approved or cleared by the [place] FDA and  has been authorized for detection and/or diagnosis of SARS-CoV-2 by FDA under an Emergency Use  Authorization (EUA). This EUA will remain  in effect (meaning this test can be used) for the duration of the COVID-19 declaration under Section 56 4(b)(1) of the Act, 21 U.S.C. section 360bbb-3(b)(1), unless the authorization is terminated or revoked sooner. Performed at [facility], [place] [address] [place], [place] [address]       Imaging Studies   No results found.   Medications   Scheduled Meds: . atorvastatin  80 mg Oral QHS  . diazepam  2 mg Oral BID  . furosemide  20 mg Oral QODAY  . insulin aspart  0-15 Units Subcutaneous TID WC  . insulin glargine  24 Units Subcutaneous QHS  . isosorbide mononitrate  60 mg Oral Daily  . meclizine  50 mg Oral BID  . metoprolol succinate  150 mg Oral QHS  . pantoprazole  40 mg Oral Daily  . sertraline  150 mg Oral QHS  . sodium chloride flush  3 mL Intravenous Q12H  . tamsulosin  0.4 mg Oral QHS   Continuous Infusions: . sodium chloride         LOS: 2 days    Time spent: 20 minutes    Ezekiel Slocumb, DO [facility]   If 7PM-7AM, please contact night-coverage [email or web address] 10/13/2019, 11:29 AM

## 2019-10-14 LAB — GLUCOSE, CAPILLARY
Glucose-Capillary: 157 mg/dL — ABNORMAL HIGH (ref 70–99)
Glucose-Capillary: 158 mg/dL — ABNORMAL HIGH (ref 70–99)
Glucose-Capillary: 152 mg/dL — ABNORMAL HIGH (ref 70–99)
Glucose-Capillary: 198 mg/dL — ABNORMAL HIGH (ref 70–99)

## 2019-10-14 LAB — RESPIRATORY PANEL BY RT PCR (FLU A&B, COVID)
Influenza A by PCR: NEGATIVE
Influenza B by PCR: NEGATIVE
SARS Coronavirus 2 by RT PCR: NEGATIVE

## 2019-10-14 MED ORDER — CLOPIDOGREL BISULFATE 75 MG PO TABS
75.0000 mg | ORAL_TABLET | Freq: Every day | ORAL | Status: DC
  Administered 2019-10-14 – 2019-10-15 (×2): 75 mg via ORAL
  Filled 2019-10-14 (×2): qty 1

## 2019-10-14 MED ORDER — ASPIRIN EC 81 MG PO TBEC
81.0000 mg | DELAYED_RELEASE_TABLET | Freq: Every day | ORAL | Status: DC
  Administered 2019-10-15: 81 mg via ORAL
  Filled 2019-10-14: qty 1

## 2019-10-14 NOTE — Progress Notes (Signed)
Physical Therapy Treatment Patient Details Name: Peter Robertson MRN: [id number] DOB: 29-Jun-1940 Today's Date: 10/14/2019    History of Present Illness 79 y/o man with h/o DM presents to the ED with syncope. Patient has been having gait difficulty and vertigo for 2 years. STAT teleneurology consult requested for MRI findings of enlarged ventricles. MRI brain reviewed and case discussed with ED attending. Patient said he came to the ED because I passed out shortly using the restroom. Patient has been having diarrhea. No h/o seizures. No tongue bite. No family at bedside.    PT Comments    Pt is making limited progress towards goals with limitations from dizziness. Pt received seated on EOB upon arrival trying to get rid of dizziness. Asking to return supine when this writer entered room. Assisted pt in positioning. Agreeable to limited there-ex in bed. Will continue to progress as able.   Follow Up Recommendations  SNF;Supervision/Assistance - 24 hour     Equipment Recommendations  Rolling walker with 5" wheels    Recommendations for Other Services       Precautions / Restrictions Precautions Precautions: Fall Restrictions Weight Bearing Restrictions: No    Mobility  Bed Mobility Overal bed mobility: Needs Assistance Bed Mobility: Sit to Supine       Sit to supine: Min assist   General bed mobility comments: received seated on EOB. needs assist bringing B LEs up onto bed. Dizzy.  Transfers                 General transfer comment: not performed this date due to dizziness  Ambulation/Gait                 Stairs             Wheelchair Mobility    Modified Rankin (Stroke Patients Only)       Balance                                            Cognition Arousal/Alertness: Awake/alert Behavior During Therapy: WFL for tasks assessed/performed Overall Cognitive Status: Within Functional Limits for tasks assessed                                        Exercises Other Exercises Other Exercises: supine ther-ex performed including B LE AP, quad sets, SLRs, hip abd/add, and heel slides. ALl ther-ex performed x 10 reps with safe technique    General Comments        Pertinent Vitals/Pain Pain Assessment: No/denies pain    Home Living                      Prior Function            PT Goals (current goals can now be found in the care plan section) Acute Rehab PT Goals Patient Stated Goal: to go home PT Goal Formulation: With patient Time For Goal Achievement: 10/31/19 Potential to Achieve Goals: Fair Progress towards PT goals: Progressing toward goals    Frequency    Min 2X/week      PT Plan Current plan remains appropriate    Co-evaluation              AM-PAC PT "6 Clicks" Mobility   Outcome Measure  Help needed turning from your back to your side while in a flat bed without using bedrails?: A Little Help needed moving from lying on your back to sitting on the side of a flat bed without using bedrails?: A Little Help needed moving to and from a bed to a chair (including a wheelchair)?: A Little Help needed standing up from a chair using your arms (e.g., wheelchair or bedside chair)?: A Little Help needed to walk in hospital room?: A Lot Help needed climbing 3-5 steps with a railing? : Total 6 Click Score: 15    End of Session Equipment Utilized During Treatment: Gait belt Activity Tolerance: Patient tolerated treatment well Patient left: in bed;with call bell/phone within reach;with bed alarm set Nurse Communication: Mobility status PT Visit Diagnosis: Unsteadiness on feet (R26.81);Other abnormalities of gait and mobility (R26.89);Repeated falls (R29.6);Difficulty in walking, not elsewhere classified (R26.2);Muscle weakness (generalized) (M62.81);BPPV BPPV - Right/Left : Right     Time: 1551-1600 PT Time Calculation (min) (ACUTE ONLY): 9 min  Charges:   $Therapeutic Exercise: 8-22 mins                     Greggory Stallion, PT, DPT [phone]    Laverta Harnisch 10/14/2019, 4:23 PM

## 2019-10-14 NOTE — Progress Notes (Signed)
PROGRESS NOTE    Peter Robertson   [id number]  DOB: Oct 26, 1940  PCP: Danae Orleans, MD    DOA: 10/10/2019 LOS: 3   Brief Narrative   Peter Robertson is a 79 y.o. 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   His dizziness is aggravated by change in position, described as room spinning.  Patient has had vertigo for approximately one year, being followed by PCP and had reportedly done vestibular rehab.  Patient's additional complaint on admission is dark stools for about one week.  He is on ASA & Plavix, but no other NSAIDs.  No history of GI bleeding.  Prior colonoscopy in 2016 with polyps.   Admitted to hospitalist service for further evaluation and management.     Assessment & Plan   Active Problems:   BPPV (benign paroxysmal positional vertigo)   CAD (coronary artery disease)   Diabetes mellitus (HCC)   BPH (benign prostatic hyperplasia)   Thrombocytopenia (HCC)   Dark stools   Vertigo   Vertigo - long-standing. Provoked by position changes, particularly with turning head suggests BPPV.  MRI today not suggestive of a central process, enlarged appearing ventricles most likely is central atrophy, age-related. Patient without confusion or urinary incontinence to suggest NPH.  Unclear to what extent pt has participated in vestibular rehab but patient reports everything they tried made him vomit.  Declines to try Epley maneuver. Neuro consulted, advise outpatient eval once able to discharge.  --PT/OT: recommend SNF, insurance auth pending --Continue meclizine 50 mg BID --trial of low dose Valium BID since no relief with meclizine, pt tolerating so far  Inability to Ambulate - secondary to above. Patient lives alone, has limited help  of a neighbor, but in current condition would require 24 hour supervision and assistance to return home safely.  Continue working with PT.  SNF recommended.  Insurance auth pending.  Dark stools - on admission, pt reported one week of tarry dark stools at home. Hgb is stable. Polyps seen on last colonoscopy 5 years ago.  Hbg remained stable.  FOBT ordered but no result posted.  Safe to resume ASA & Plavix.  Monitor for recurrence and recheck CBC in 1 week.  Coronary Artery Disease - stable, asymptomatic --Continue home Lipitor, Lasix, Imdur --Continue ASA, Plavix  Type 2 Diabetes - last A1c is from 2015, 8.4% at that time.   --follow up repeat A1c --Lantus 24 units qHS with sliding scale Novolog  Thrombocytopenia - stable at 99. Had heme eval that advised no further evaluation and instead monitoring  Essential hypertension - uncontrolled on admission.   --resume home Toprol, Lasix, Imdur --PRN oral hydralazine  Patient BMI: Body mass index is 29.11 kg/m.   DVT prophylaxis: scd's  Diet:  Diet Orders (From admission, onward)    Start     Ordered   10/10/19 2023  Diet regular Room service appropriate? Yes; Fluid consistency: Thin  Diet effective now    Question Answer Comment  Room service appropriate? Yes   Fluid consistency: Thin      10/10/19 2022            Code Status: Full Code    Subjective 10/14/19    Patient seen up in chair this AM.  Feels about the same.  Denies feeling  sedated with the valium, actually says he has more energy.  No other complaints including fever/chills, headache, vision changes, N/V/D, CP, SOB.  No acute events reported.     Disposition Plan & Communication   Dispo & Barriers: patient unable to ambulate due to vertigo/dizziness and lives alone with limited help from a neighbor.  Continues to require inpatient care until safe to discharge, currently requiring 24 hour supervision/assistance with all mobility.  SNF placement pending. Coming  from: home, lives alone Exp d/c date: 4/26 Medically stable for d/c? no  Family Communication: none at bedside during encounter, will attempt to call    Consults, Procedures, Significant Events   Consultants:   Neurology  Procedures:   None  Antimicrobials:   None     Objective   Vitals:   10/13/19 1146 10/13/19 2006 10/14/19 0512 10/14/19 1151  BP: 137/72 (!) 151/80 134/78 114/70  Pulse: 62 72 61 (!) 53  Resp: 17 20 18 20   Temp: 97.6 F (36.4 C) 98.1 F (36.7 C) 97.9 F (36.6 C) (!) 97.5 F (36.4 C)  TempSrc: Oral Oral Oral Oral  SpO2: 99% 99% 98% 97%  Weight:      Height:        Intake/Output Summary (Last 24 hours) at 10/14/2019 1546 Last data filed at 10/14/2019 1100 Gross per 24 hour  Intake 240 ml  Output 700 ml  Net -460 ml   Filed Weights   10/10/19 1351 10/10/19 2023  Weight: 81.6 kg 81.8 kg    Physical Exam:  General exam: awake, alert, no acute distress Respiratory system: CTAB, no wheezes, rales or rhonchi, normal respiratory effort. Cardiovascular system: normal S1/S2, RRR, no pedal edema.   Central nervous system: A&O x4. no gross focal neurologic deficits, normal speech   Labs   Data Reviewed: I have personally reviewed following labs and imaging studies  CBC: Recent Labs  Lab 10/10/19 1354 10/11/19 0400 10/13/19 0629  WBC 7.8 9.8 7.2  NEUTROABS 6.5  --   --   HGB 11.6* 11.7* 11.4*  HCT 34.4* 34.9* 34.2*  MCV 82.9 83.1 83.8  PLT 99* 100* 123456*   Basic Metabolic Panel: Recent Labs  Lab 10/10/19 1354 10/11/19 0400  NA 138 134*  K 4.8 4.0  CL 105 102  CO2 25 23  GLUCOSE 201* 132*  BUN 32* 22  CREATININE 1.22 1.10  CALCIUM 9.1 8.9   GFR: Estimated Creatinine Clearance: 53.8 mL/min (by C-G formula based on SCr of 1.1 mg/dL). Liver Function Tests: Recent Labs  Lab 10/10/19 1354  AST 15  ALT 13  ALKPHOS 95  BILITOT 1.0  PROT 7.5  ALBUMIN 4.3   Recent Labs  Lab 10/10/19 1354  LIPASE 27   No results for  input(s): AMMONIA in the last 168 hours. Coagulation Profile: No results for input(s): INR, PROTIME in the last 168 hours. Cardiac Enzymes: No results for input(s): CKTOTAL, CKMB, CKMBINDEX, TROPONINI in the last 168 hours. BNP (last 3 results) No results for input(s): PROBNP in the last 8760 hours. HbA1C: No results for input(s): HGBA1C in the last 72 hours. CBG: Recent Labs  Lab 10/13/19 1146 10/13/19 1639 10/13/19 2157 10/14/19 0741 10/14/19 1149  GLUCAP 381* 88 212* 157* 158*   Lipid Profile: No results for input(s): CHOL, HDL, LDLCALC, TRIG, CHOLHDL, LDLDIRECT in the last 72 hours. Thyroid Function Tests: No results for input(s): TSH, T4TOTAL, FREET4, T3FREE, THYROIDAB in the last 72 hours. Anemia Panel: No results for input(s): VITAMINB12, FOLATE, FERRITIN, TIBC,  IRON, RETICCTPCT in the last 72 hours. Sepsis Labs: Recent Labs  Lab 10/10/19 1359  LATICACIDVEN 0.9    Recent Results (from the past 240 hour(s))  SARS CORONAVIRUS 2 (TAT 6-24 HRS) Nasopharyngeal Nasopharyngeal Swab     Status: None   Collection Time: 10/10/19  6:46 PM   Specimen: Nasopharyngeal Swab  Result Value Ref Range Status   SARS Coronavirus 2 NEGATIVE NEGATIVE Final    Comment: (NOTE) SARS-CoV-2 target nucleic acids are NOT DETECTED. The SARS-CoV-2 RNA is generally detectable in upper and lower respiratory specimens during the acute phase of infection. Negative results do not preclude SARS-CoV-2 infection, do not rule out co-infections with other pathogens, and should not be used as the sole basis for treatment or other patient management decisions. Negative results must be combined with clinical observations, patient history, and epidemiological information. The expected result is Negative. Fact Sheet for Patients: SugarRoll.be Fact Sheet for Healthcare Providers: https://www.woods-mathews.com/ This test is not yet approved or cleared by the [place] FDA and  has been authorized for detection and/or diagnosis of SARS-CoV-2 by FDA under an Emergency Use Authorization (EUA). This EUA will remain  in effect (meaning this test can be used) for the duration of the COVID-19 declaration under Section 56 4(b)(1) of the Act, 21 U.S.C. section 360bbb-3(b)(1), unless the authorization is terminated or revoked sooner. Performed at [facility], [place] [address] [place], [place] [address]   Respiratory Panel by RT PCR (Flu A&B, Covid) - Nasopharyngeal Swab     Status: None   Collection Time: 10/14/19 12:30 PM   Specimen: Nasopharyngeal Swab  Result Value Ref Range Status   SARS Coronavirus 2 by RT PCR NEGATIVE NEGATIVE Final    Comment: (NOTE) SARS-CoV-2 target nucleic acids are NOT DETECTED. The SARS-CoV-2 RNA is generally detectable in upper respiratoy specimens during the acute phase of infection. The lowest concentration of SARS-CoV-2 viral copies this assay can detect is 131 copies/mL. A negative result does not preclude SARS-Cov-2 infection and should not be used as the sole basis for treatment or other patient management decisions. A negative result may occur with  improper specimen collection/handling, submission of specimen other than nasopharyngeal swab, presence of viral mutation(s) within the areas targeted by this assay, and inadequate number of viral copies (<131 copies/mL). A negative result must be combined with clinical observations, patient history, and epidemiological information. The expected result is Negative. Fact Sheet for Patients:  PinkCheek.be Fact Sheet for Healthcare Providers:  GravelBags.it This test is not yet ap proved or cleared by the [place] FDA and  has been authorized for detection and/or diagnosis of SARS-CoV-2 by FDA under an Emergency Use Authorization (EUA). This EUA will remain  in effect (meaning this test can be used) for  the duration of the COVID-19 declaration under Section 564(b)(1) of the Act, 21 U.S.C. section 360bbb-3(b)(1), unless the authorization is terminated or revoked sooner.    Influenza A by PCR NEGATIVE NEGATIVE Final   Influenza B by PCR NEGATIVE NEGATIVE Final    Comment: (NOTE) The Xpert Xpress SARS-CoV-2/FLU/RSV assay is intended as an aid in  the diagnosis of influenza from Nasopharyngeal swab specimens and  should not be used as a sole basis for treatment. Nasal washings and  aspirates are unacceptable for Xpert Xpress SARS-CoV-2/FLU/RSV  testing. Fact Sheet for Patients: PinkCheek.be Fact Sheet for Healthcare Providers: GravelBags.it This test is not yet approved or cleared by the [place] FDA and  has been authorized for detection  and/or diagnosis of SARS-CoV-2 by  FDA under an Emergency Use Authorization (EUA). This EUA will remain  in effect (meaning this test can be used) for the duration of the  Covid-19 declaration under Section 564(b)(1) of the Act, 21  U.S.C. section 360bbb-3(b)(1), unless the authorization is  terminated or revoked. Performed at [facility], [address] [place], [place] [address]       Imaging Studies   No results found.   Medications   Scheduled Meds: . atorvastatin  80 mg Oral QHS  . diazepam  2 mg Oral BID  . furosemide  20 mg Oral QODAY  . insulin aspart  0-15 Units Subcutaneous TID WC  . insulin glargine  24 Units Subcutaneous QHS  . isosorbide mononitrate  60 mg Oral Daily  . meclizine  50 mg Oral BID  . metoprolol succinate  150 mg Oral QHS  . pantoprazole  40 mg Oral Daily  . sertraline  150 mg Oral QHS  . sodium chloride flush  3 mL Intravenous Q12H  . tamsulosin  0.4 mg Oral QHS   Continuous Infusions: . sodium chloride         LOS: 3 days    Time spent: 20 minutes    Ezekiel Slocumb, DO [facility]   If 7PM-7AM, please  contact night-coverage [email or web address] 10/14/2019, 3:46 PM

## 2019-10-14 NOTE — Care Management Important Message (Signed)
Important Message  Patient Details  Name: Peter Robertson MRN: [id number] Date of Birth: [birth year]   Medicare Important Message Given:  Yes     Dannette Barbara 10/14/2019, 11:21 AM

## 2019-10-14 NOTE — Progress Notes (Addendum)
Inpatient Diabetes Program Recommendations  AACE/ADA: New Consensus Statement on Inpatient Glycemic Control   Target Ranges:  Prepandial:   less than 140 mg/dL      Peak postprandial:   less than 180 mg/dL (1-2 hours)      Critically ill patients:  140 - 180 mg/dL   Results for Peter Robertson, Peter Robertson (MRN [id number]) as of 10/14/2019 09:26  Ref. Range 10/13/2019 07:41 10/13/2019 11:46 10/13/2019 16:39 10/13/2019 21:57 10/14/2019 07:41  Glucose-Capillary Latest Ref Range: 70 - 99 mg/dL 139 (H) 381 (H) 88 212 (H) 157 (H)   Review of Glycemic Control  Diabetes history: DM2 Outpatient Diabetes medications: Lantus 24 units QHS, Regular per correction scale TID, Metformin 1000 mg BID Current orders for Inpatient glycemic control: Lantus 24 units QHS, Novolog 0-15 units TID with meals  Inpatient Diabetes Program Recommendations:   Insulin-Meal Coverage: Please consider ordering Novolog 3 units TID with meals for meal coverage if patient eats at least 50% of meals.  Diet: If appropriate, please consider discontinuing Regular diet and ordering Carb Modified diet.  Thanks, Barnie Alderman, RN, MSN, CDE Diabetes Coordinator Inpatient Diabetes Program [phone] (Team Pager from 8am to 5pm)

## 2019-10-15 DIAGNOSIS — E119 Type 2 diabetes mellitus without complications: Secondary | ICD-10-CM

## 2019-10-15 DIAGNOSIS — D696 Thrombocytopenia, unspecified: Secondary | ICD-10-CM

## 2019-10-15 DIAGNOSIS — R42 Dizziness and giddiness: Secondary | ICD-10-CM

## 2019-10-15 LAB — GLUCOSE, CAPILLARY
Glucose-Capillary: 222 mg/dL — ABNORMAL HIGH (ref 70–99)
Glucose-Capillary: 178 mg/dL — ABNORMAL HIGH (ref 70–99)

## 2019-10-15 MED ORDER — FUROSEMIDE 20 MG PO TABS: 20.0000 mg | ORAL_TABLET | ORAL | Status: DC

## 2019-10-15 MED ORDER — MECLIZINE HCL 50 MG PO TABS: 50.0000 mg | ORAL_TABLET | Freq: Two times a day (BID) | ORAL | Status: DC

## 2019-10-15 NOTE — Progress Notes (Signed)
Pt transported via EMS to [facility]. Discharge packet and pts belongings sent with pt.

## 2019-10-15 NOTE — Progress Notes (Signed)
Report called to [facility] 

## 2019-10-15 NOTE — TOC Transition Note (Signed)
Transition of Care [facility]) - CM/SW Discharge Note   Patient Details  Name: Peter Robertson MRN: [id number] Date of Birth: 01/16/1941  [facility] CM/SW Contact:  Candie Chroman, LCSW Phone Number: 10/15/2019, 11:21 AM   Clinical Narrative: Patient has orders to discharge to [facility] today. EMS has been called and there is only one person in front of him on the list. Nurse is calling report now. No further concerns. CSW signing off.    Final next level of care: Skilled Nursing Facility Barriers to Discharge: Barriers Resolved   Patient Goals and CMS Choice   CMS Medicare.gov Compare Post Acute Care list provided to:: Patient Choice offered to / list presented to : Patient, [facility] POA / Guardian  Discharge Placement   Existing PASRR number confirmed : 10/15/19          Patient chooses bed at: [facility] Patient to be transferred to facility by: EMS Name of family member notified: Lorinda Creed Patient and family notified of of transfer: 10/15/19  Discharge Plan and Services                                     Social Determinants of Health (SDOH) Interventions     Readmission Risk Interventions No flowsheet data found.

## 2019-10-15 NOTE — Discharge Summary (Signed)
Physician Discharge Summary  Peter Robertson YIR:[id number] DOB: 12/15/1940 DOA: 10/10/2019  PCP: Danae Orleans, MD  Admit date: 10/10/2019 Discharge date: 10/15/2019  Recommendations for Outpatient Follow-up:   Vertigo longstanding, provoked by head movement, suggesting BPPV.  MRI no evidence of central process.  Enlarged ventricles evaluated by teleneurology with suspicion for atrophy.  No signs or symptoms to suggest normal pressure hydrocephalus. --4/24 inpatient neurology consultation.  Vertigo thought to be positional given prior history and current history.  Vertebrobasilar insufficiency not suspected.  Generalized atrophy noted.  NPH not suspected.  Outpatient follow-up and consider repeat CT head or MRI brain in 6 months --Evaluated by PT, OT with recommendation for SNF. --Continue meclizine --We will hold Valium given advanced age and increased fall risk, however if dizziness remains refractory, could consider resuming this if no other options --Outpatient follow-up with neurology recommended in 4 weeks    Contact information for follow-up providers    Vladimir Crofts, MD. Schedule an appointment as soon as possible for a visit in 4 week(s).   Specialty: Neurology Contact information: [place] [facility] [place] [address] [phone]            Contact information for after-discharge care    [facility] .   Service: Skilled Nursing Contact information: [place] [phone]                   Discharge Diagnoses: Principal diagnosis is #1 1. Vertigo longstanding 2. Inability to ambulate secondary to vertigo, lives alone 3. Chronic thrombocytopenia 4. Insulin-dependent diabetes mellitus  Discharge Condition: improved Disposition: SNF for rehab  Diet recommendation: diabetic diet  Filed Weights   10/10/19 1351 10/10/19 2023   Weight: 81.6 kg 81.8 kg    History of present illness:  79 year old man PMH including vertigo presented with worsening vertigo, severe dizziness resulting into falls and inability to ambulate.  Dizziness aggravated by change in position.  Admitted for further evaluation.  Hospital Course:  Further evaluation including imaging of the brain and neurology consultation.  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  MRI brain with enlarged ventricles but thought by neurology to be atrophy and not NPH.  Recommendation for outpatient follow-up and consideration for reimaging in 6 months.  Vertigo longstanding, provoked by head movement, suggesting BPPV.  MRI no evidence of central process.  Enlarged ventricles evaluated by teleneurology with suspicion for atrophy.  No signs or symptoms to suggest normal pressure hydrocephalus. --4/24 inpatient neurology consultation.  Vertigo thought to be positional given prior history and current history.  Vertebrobasilar insufficiency not suspected.  Generalized atrophy noted.  NPH not suspected.  Outpatient follow-up and consider repeat CT head or MRI brain in 6 months --Evaluated by PT, OT with recommendation for SNF. --Continue meclizine --We will hold Valium given advanced age and increased fall risk, however if dizziness remains refractory, could consider resuming this if no other options --Tolerating diet --Outpatient follow-up with neurology  Inability to ambulate secondary to vertigo, lives alone --SNF planned  Dark stools? --Hemoglobin stable, no FOBT obtained. --Resume aspirin and Plavix given no evidence of bleeding.  Consider rechecking CBC in 1 week  Chronic thrombocytopenia --Stable, follow-up as an outpatient  Insulin-dependent diabetes mellitus --CBG stable. --Continue Lantus, resume  metformin on discharge  CAD status post CABG 2011 --Continue atorvastatin, furosemide, Imdur --Clopidogrel, aspirin  Significant Hospital Events   . 4/23 admitted for vertigo   Consults:  . 4/23 teleneurology consultation for comment on MRI brain findings enlarged ventricles.  Thought secondary to atrophy.  Recommendations were for outpatient neurology work-up for enlarged ventricles, again thought secondary to atrophy. . 4/24 inpatient neurology consultation.  Vertigo thought to be positional given prior history and current history.  Vertebrobasilar insufficiency not suspected.  Generalized atrophy noted.  NPH not suspected.  Outpatient follow-up and consider repeat CT head or MRI brain in 6 months.   Procedures:  . None   Significant Diagnostic Tests:  4/23 admitted for vertigo MRI brain motion degraded, no acute infarct, mild lateral and third ventriculomegaly may be related to central predominant atrophy.   Micro Data:  . SARS-CoV-2 negative   Antimicrobials:  . None  Today's assessment: S: Feels dizzy but no new issues.  Had breakfast this morning, no vomiting.  Was able to eat some supper last night.  No new issues reported. O: Vitals:  Vitals:   10/14/19 2043 10/15/19 0435  BP: 130/76 (!) 151/79  Pulse: 65 69  Resp: 20 20  Temp: 98.3 F (36.8 C) 97.8 F (36.6 C)  SpO2: 98% 97%    Constitutional:  . Appears calm and comfortable Eyes:  . pupils and irises appear normal ENMT:  . grossly normal hearing  Respiratory:  . CTA bilaterally, no w/r/r.  . Respiratory effort normal. Cardiovascular:  . RRR, no m/r/g . No LE extremity edema   Musculoskeletal:  . RUE, LUE, RLE, LLE   o strength and tone appear grossly normal Psychiatric:  . Mental status o Mood, affect appropriate  CBG stable   Discharge Instructions   Allergies as of 10/15/2019      Reactions   Sulfamethoxazole-trimethoprim Other (See Comments)   dizziness dizziness dizziness   Tape Rash    blisters      Medication List    STOP taking these medications   Enema 7-19 GM/118ML Enem   insulin regular 100 units/mL injection Commonly known as: NOVOLIN R   Melatonin 10 MG Caps   pantoprazole 40 MG tablet Commonly known as: PROTONIX     TAKE these medications   aspirin EC 81 MG tablet Take 81 mg by mouth at bedtime.   atorvastatin 80 MG tablet Commonly known as: LIPITOR Take 80 mg by mouth at bedtime.   blood glucose meter kit and supplies Kit Dispense based on patient and insurance preference. Use up to four times daily as directed. (FOR ICD-9 250.00, 250.01).   cholecalciferol 25 MCG (1000 UNIT) tablet Commonly known as: VITAMIN D3 Take 1,000 Units by mouth 2 (two) times daily.   clopidogrel 75 MG tablet Commonly known as: PLAVIX Take 75 mg by mouth daily.   furosemide 20 MG tablet Commonly known as: Lasix Take 1 tablet (20 mg total) by mouth every other day. Start taking on: October 17, 2019   insulin glargine 100 UNIT/ML injection Commonly known as: LANTUS Inject 24 Units into the skin at bedtime.   isosorbide mononitrate 60 MG 24 hr tablet Commonly known as: IMDUR Take 60 mg by mouth daily.   ketorolac 0.5 % ophthalmic solution Commonly known as: ACULAR Place 1 drop into the right eye 4 (four) times daily.   meclizine 50 MG tablet Commonly known as: ANTIVERT Take 1 tablet (50 mg total) by mouth 2 (two) times daily.   metFORMIN 500 MG tablet Commonly  known as: GLUCOPHAGE Take 1,000 mg by mouth 2 (two) times daily. What changed: Another medication with the same name was removed. Continue taking this medication, and follow the directions you see here.   metoprolol succinate 50 MG 24 hr tablet Commonly known as: TOPROL-XL Take 50 mg by mouth at bedtime.   moxifloxacin 0.5 % ophthalmic solution Commonly known as: VIGAMOX Place 1 drop into the right eye 4 (four) times daily.   nitroGLYCERIN 0.4 MG SL tablet Commonly known as:  NITROSTAT Place 0.4 mg under the tongue every 5 (five) minutes as needed for chest pain.   omeprazole 40 MG capsule Commonly known as: PRILOSEC Take 40 mg by mouth 2 (two) times daily.   prednisoLONE acetate 1 % ophthalmic suspension Commonly known as: PRED FORTE Place 1 drop into the right eye 4 (four) times daily.   PRESERVISION AREDS 2+MULTI VIT PO Take by mouth.   sertraline 100 MG tablet Commonly known as: ZOLOFT Take 150 mg by mouth at bedtime.   tamsulosin 0.4 MG Caps capsule Commonly known as: FLOMAX Take 0.4 mg by mouth at bedtime.      Allergies  Allergen Reactions  . Sulfamethoxazole-Trimethoprim Other (See Comments)    dizziness dizziness dizziness   . Tape Rash    blisters    The results of significant diagnostics from this hospitalization (including imaging, microbiology, ancillary and laboratory) are listed below for reference.    Significant Diagnostic Studies: MR BRAIN WO CONTRAST  Result Date: 10/10/2019 CLINICAL DATA:  Vertigo, central. Additional history provided: Patient presents via EMS with fall, orthostatic upon arrival, diarrhea for 2 days. EXAM: MRI HEAD WITHOUT CONTRAST TECHNIQUE: Multiplanar, multiecho pulse sequences of the brain and surrounding structures were obtained without intravenous contrast. COMPARISON:  Prior head CT examinations 04/07/2019 and earlier FINDINGS: Brain: Multiple sequences are motion degraded, limiting evaluation. Most notably there is moderate motion degradation of the sagittal T1 weighted sequence, moderate/severe motion degradation of the axial SWI sequence, severe motion degradation of the axial T1 weighted sequence and moderate motion degradation of the axial T2 weighted sequence. There is no evidence of acute infarct. No evidence of intracranial mass. No midline shift or extra-axial fluid collection. No chronic intracranial blood products. No significant white matter disease for age. There is mild generalized  parenchymal atrophy. There is mild lateral and third ventriculomegaly which may be related to central predominant atrophy. A component of normal pressure hydrocephalus cannot be excluded. Vascular: Flow voids maintained within the proximal large arterial vessels. Skull and upper cervical spine: No focal marrow lesion. Ligamentous hypertrophy/pannus formation at the level of the skull base and C1-C2 articulation with mild narrowing of the craniocervical junction. Sinuses/Orbits: Visualized orbits demonstrate no acute abnormality. Trace ethmoid and maxillary sinus mucosal thickening at the imaged levels. No significant mastoid effusion. IMPRESSION: 1. Motion degraded examination as described. 2. No evidence of acute infarct. 3. Mild lateral and third ventriculomegaly may be related to central predominant atrophy. A component of normal pressure hydrocephalus cannot be excluded. 4. Ligamentous hypertrophy/pannus formation at the level of the skull base and C1-C2 articulation with resultant mild narrowing of the craniocervical junction. 5. Mild ethmoid and maxillary sinus mucosal thickening. Electronically Signed   By: Kellie Simmering DO   On: 10/10/2019 16:34    Microbiology: Recent Results (from the past 240 hour(s))  SARS CORONAVIRUS 2 (TAT 6-24 HRS) Nasopharyngeal Nasopharyngeal Swab     Status: None   Collection Time: 10/10/19  6:46 PM   Specimen: Nasopharyngeal Swab  Result  Value Ref Range Status   SARS Coronavirus 2 NEGATIVE NEGATIVE Final    Comment: (NOTE) SARS-CoV-2 target nucleic acids are NOT DETECTED. The SARS-CoV-2 RNA is generally detectable in upper and lower respiratory specimens during the acute phase of infection. Negative results do not preclude SARS-CoV-2 infection, do not rule out co-infections with other pathogens, and should not be used as the sole basis for treatment or other patient management decisions. Negative results must be combined with clinical observations, patient  history, and epidemiological information. The expected result is Negative. Fact Sheet for Patients: SugarRoll.be Fact Sheet for Healthcare Providers: https://www.woods-mathews.com/ This test is not yet approved or cleared by the [place] FDA and  has been authorized for detection and/or diagnosis of SARS-CoV-2 by FDA under an Emergency Use Authorization (EUA). This EUA will remain  in effect (meaning this test can be used) for the duration of the COVID-19 declaration under Section 56 4(b)(1) of the Act, 21 U.S.C. section 360bbb-3(b)(1), unless the authorization is terminated or revoked sooner. Performed at [facility], [place] [address] [place], [place] [address]   Respiratory Panel by RT PCR (Flu A&B, Covid) - Nasopharyngeal Swab     Status: None   Collection Time: 10/14/19 12:30 PM   Specimen: Nasopharyngeal Swab  Result Value Ref Range Status   SARS Coronavirus 2 by RT PCR NEGATIVE NEGATIVE Final    Comment: (NOTE) SARS-CoV-2 target nucleic acids are NOT DETECTED. The SARS-CoV-2 RNA is generally detectable in upper respiratoy specimens during the acute phase of infection. The lowest concentration of SARS-CoV-2 viral copies this assay can detect is 131 copies/mL. A negative result does not preclude SARS-Cov-2 infection and should not be used as the sole basis for treatment or other patient management decisions. A negative result may occur with  improper specimen collection/handling, submission of specimen other than nasopharyngeal swab, presence of viral mutation(s) within the areas targeted by this assay, and inadequate number of viral copies (<131 copies/mL). A negative result must be combined with clinical observations, patient history, and epidemiological information. The expected result is Negative. Fact Sheet for Patients:  PinkCheek.be Fact Sheet for Healthcare Providers:   GravelBags.it This test is not yet ap proved or cleared by the [place] FDA and  has been authorized for detection and/or diagnosis of SARS-CoV-2 by FDA under an Emergency Use Authorization (EUA). This EUA will remain  in effect (meaning this test can be used) for the duration of the COVID-19 declaration under Section 564(b)(1) of the Act, 21 U.S.C. section 360bbb-3(b)(1), unless the authorization is terminated or revoked sooner.    Influenza A by PCR NEGATIVE NEGATIVE Final   Influenza B by PCR NEGATIVE NEGATIVE Final    Comment: (NOTE) The Xpert Xpress SARS-CoV-2/FLU/RSV assay is intended as an aid in  the diagnosis of influenza from Nasopharyngeal swab specimens and  should not be used as a sole basis for treatment. Nasal washings and  aspirates are unacceptable for Xpert Xpress SARS-CoV-2/FLU/RSV  testing. Fact Sheet for Patients: PinkCheek.be Fact Sheet for Healthcare Providers: GravelBags.it This test is not yet approved or cleared by the [place] FDA and  has been authorized for detection and/or diagnosis of SARS-CoV-2 by  FDA under an Emergency Use Authorization (EUA). This EUA will remain  in effect (meaning this test can be used) for the duration of the  Covid-19 declaration under Section 564(b)(1) of the Act, 21  U.S.C. section 360bbb-3(b)(1), unless the authorization is  terminated or revoked. Performed at [facility], [phone] [place], [place] [address]      Labs: Basic Metabolic Panel: Recent Labs  Lab 10/10/19 1354 10/11/19 0400  NA 138 134*  K 4.8 4.0  CL 105 102  CO2 25 23  GLUCOSE 201* 132*  BUN 32* 22  CREATININE 1.22 1.10  CALCIUM 9.1 8.9   Liver Function Tests: Recent Labs  Lab 10/10/19 1354  AST 15  ALT 13  ALKPHOS 95  BILITOT 1.0  PROT 7.5  ALBUMIN 4.3   Recent Labs  Lab 10/10/19 1354  LIPASE 27   CBC: Recent Labs   Lab 10/10/19 1354 10/11/19 0400 10/13/19 0629  WBC 7.8 9.8 7.2  NEUTROABS 6.5  --   --   HGB 11.6* 11.7* 11.4*  HCT 34.4* 34.9* 34.2*  MCV 82.9 83.1 83.8  PLT 99* 100* 113*    CBG: Recent Labs  Lab 10/14/19 0741 10/14/19 1149 10/14/19 1630 10/14/19 2119 10/15/19 0749  GLUCAP 157* 158* 152* 198* 222*    Active Problems:   BPPV (benign paroxysmal positional vertigo)   CAD (coronary artery disease)   Diabetes mellitus (HCC)   BPH (benign prostatic hyperplasia)   Thrombocytopenia (HCC)   Dark stools   Vertigo   Time coordinating discharge: 35 minutes  Signed:  Murray Hodgkins, MD  [facility]  10/15/2019, 10:37 AM

## 2019-10-15 NOTE — TOC Progression Note (Signed)
Transition of Care [facility]) - Progression Note    Patient Details  Name: Peter Robertson MRN: [id number] Date of Birth: [birth year]  [facility] CM/SW [place], LCSW Phone Number: 10/15/2019, 8:44 AM  Clinical Narrative:  Patient has insurance approval to discharge to [facility] today. Sent secure chat to MD to notify.   Expected Discharge Plan: [place] Barriers to Discharge: Continued Medical Work up, Ship broker  Expected Discharge Plan and Services Expected Discharge Plan: [place] arrangements for the past 2 months: Single Family Home                                       Social Determinants of Health (SDOH) Interventions    Readmission Risk Interventions No flowsheet data found.

## 2020-01-16 ENCOUNTER — Other Ambulatory Visit: Payer: Self-pay

## 2020-01-16 ENCOUNTER — Encounter: Payer: Self-pay | Admitting: *Deleted

## 2020-01-16 DIAGNOSIS — I2510 Atherosclerotic heart disease of native coronary artery without angina pectoris: Secondary | ICD-10-CM | POA: Insufficient documentation

## 2020-01-16 DIAGNOSIS — J45909 Unspecified asthma, uncomplicated: Secondary | ICD-10-CM | POA: Diagnosis not present

## 2020-01-16 DIAGNOSIS — M545 Low back pain: Secondary | ICD-10-CM | POA: Diagnosis not present

## 2020-01-16 DIAGNOSIS — Z7982 Long term (current) use of aspirin: Secondary | ICD-10-CM | POA: Insufficient documentation

## 2020-01-16 DIAGNOSIS — E119 Type 2 diabetes mellitus without complications: Secondary | ICD-10-CM | POA: Insufficient documentation

## 2020-01-16 DIAGNOSIS — N179 Acute kidney failure, unspecified: Secondary | ICD-10-CM | POA: Insufficient documentation

## 2020-01-16 DIAGNOSIS — Z87891 Personal history of nicotine dependence: Secondary | ICD-10-CM | POA: Insufficient documentation

## 2020-01-16 DIAGNOSIS — Z951 Presence of aortocoronary bypass graft: Secondary | ICD-10-CM | POA: Diagnosis not present

## 2020-01-16 DIAGNOSIS — Z79899 Other long term (current) drug therapy: Secondary | ICD-10-CM | POA: Diagnosis not present

## 2020-01-16 DIAGNOSIS — Z794 Long term (current) use of insulin: Secondary | ICD-10-CM | POA: Diagnosis not present

## 2020-01-16 DIAGNOSIS — R109 Unspecified abdominal pain: Secondary | ICD-10-CM | POA: Insufficient documentation

## 2020-01-16 LAB — URINALYSIS, COMPLETE (UACMP) WITH MICROSCOPIC
Bacteria, UA: NONE SEEN
Bilirubin Urine: NEGATIVE
Glucose, UA: 50 mg/dL — AB
Hgb urine dipstick: NEGATIVE
Ketones, ur: NEGATIVE mg/dL
Leukocytes,Ua: NEGATIVE
Nitrite: NEGATIVE
Protein, ur: 100 mg/dL — AB
Specific Gravity, Urine: 1.015 (ref 1.005–1.030)
pH: 5 (ref 5.0–8.0)

## 2020-01-16 LAB — BASIC METABOLIC PANEL
Anion gap: 11 (ref 5–15)
BUN: 25 mg/dL — ABNORMAL HIGH (ref 8–23)
CO2: 25 mmol/L (ref 22–32)
Calcium: 9 mg/dL (ref 8.9–10.3)
Chloride: 103 mmol/L (ref 98–111)
Creatinine, Ser: 1.36 mg/dL — ABNORMAL HIGH (ref 0.61–1.24)
GFR calc Af Amer: 57 mL/min — ABNORMAL LOW (ref >60)
GFR calc non Af Amer: 49 mL/min — ABNORMAL LOW (ref >60)
Glucose, Bld: 255 mg/dL — ABNORMAL HIGH (ref 70–99)
Potassium: 4.4 mmol/L (ref 3.5–5.1)
Sodium: 139 mmol/L (ref 135–145)

## 2020-01-16 LAB — CBC
HCT: 37 % — ABNORMAL LOW (ref 39.0–52.0)
Hemoglobin: 12.2 g/dL — ABNORMAL LOW (ref 13.0–17.0)
MCH: 26.9 pg (ref 26.0–34.0)
MCHC: 33 g/dL (ref 30.0–36.0)
MCV: 81.7 fL (ref 80.0–100.0)
Platelets: 112 10*3/uL — ABNORMAL LOW (ref 150–400)
RBC: 4.53 MIL/uL (ref 4.22–5.81)
RDW: 15.6 % — ABNORMAL HIGH (ref 11.5–15.5)
WBC: 6.1 10*3/uL (ref 4.0–10.5)
nRBC: 0 % (ref 0.0–0.2)

## 2020-01-16 NOTE — ED Triage Notes (Signed)
Left sided flank pain that started yesterday with radiation down his left leg. Pt reports blood in urine this morning that has since subsided. Hx of Kidney stones but has not had one recently. No fevers, nausea or vomiting. No diarrhea reported.

## 2020-01-17 ENCOUNTER — Emergency Department: Payer: Medicare Other

## 2020-01-17 ENCOUNTER — Emergency Department
Admission: EM | Admit: 2020-01-17 | Discharge: 2020-01-17 | Disposition: A | Discharge status: Home / Self Care | Payer: Medicare Other | Attending: Emergency Medicine | Admitting: Emergency Medicine

## 2020-01-17 DIAGNOSIS — R109 Unspecified abdominal pain: Secondary | ICD-10-CM

## 2020-01-17 DIAGNOSIS — N179 Acute kidney failure, unspecified: Secondary | ICD-10-CM

## 2020-01-17 DIAGNOSIS — M545 Low back pain, unspecified: Secondary | ICD-10-CM

## 2020-01-17 MED ORDER — SODIUM CHLORIDE 0.9 % IV BOLUS
1000.0000 mL | Freq: Once | INTRAVENOUS | Status: AC
  Administered 2020-01-17: 1000 mL via INTRAVENOUS

## 2020-01-17 MED ORDER — HYDROCODONE-ACETAMINOPHEN 5-325 MG PO TABS: 1.0000 | ORAL_TABLET | Freq: Four times a day (QID) | ORAL | 0 refills | Status: DC | PRN

## 2020-01-17 MED ORDER — HYDROCODONE-ACETAMINOPHEN 5-325 MG PO TABS
1.0000 | ORAL_TABLET | Freq: Once | ORAL | Status: AC
  Administered 2020-01-17: 1 via ORAL
  Filled 2020-01-17: qty 1

## 2020-01-17 NOTE — Discharge Instructions (Signed)
1.  You may take Norco as needed for pain. 2.  Return to the ER for worsening symptoms, persistent vomiting, difficulty breathing or other concerns.

## 2020-01-17 NOTE — ED Notes (Signed)
Patient incontinent of urine. Patient assisted to change into dry paper scrubs at this time.

## 2020-01-17 NOTE — ED Provider Notes (Signed)
[facility] Emergency Department Provider Note   ____________________________________________   First MD Initiated Contact with Patient 01/17/20 0259     (approximate)  I have reviewed the triage vital signs and the nursing notes.   HISTORY  Chief Complaint Flank Pain    HPI Peter Robertson is a 79 y.o. male who presents to the ED from home with a chief complaint of left-sided flank and back pain.  Symptoms x3 weeks after he had a mechanical fall.  Reports hematuria yesterday morning which has resolved.  History of kidney stones but states this does not feel similarly.  Denies fever, chills, cough, chest pain, shortness of breath, abdominal pain, nausea, vomiting or dysuria.       Past Medical History:  Diagnosis Date  . Asthma   . BPH (benign prostatic hyperplasia)   . Chronic kidney disease    Kidney Stones  . Coronary artery disease   . Diabetes mellitus without complication ([place])   . Heart murmur   . Hyperlipidemia   . Hypertension   . Myocardial infarction [facility])     Patient Active Problem List   Diagnosis Date Noted  . Vertigo 10/11/2019  . BPPV (benign paroxysmal positional vertigo) 10/10/2019  . CAD (coronary artery disease) 10/10/2019  . Diabetes mellitus ([place]) 10/10/2019  . BPH (benign prostatic hyperplasia) 10/10/2019  . Thrombocytopenia ([place]) 10/10/2019  . Dark stools 10/10/2019  . Unstable angina ([place]) 05/20/2018  . Chest pain 01/30/2017    Past Surgical History:  Procedure Laterality Date  . COLONOSCOPY WITH PROPOFOL N/A 12/03/2014   Procedure: COLONOSCOPY WITH PROPOFOL;  Surgeon: Manya Silvas, MD;  Location: [facility] ENDOSCOPY;  Service: Endoscopy;  Laterality: N/A;  . CORONARY ARTERY BYPASS GRAFT    . LEFT HEART CATH AND CORS/GRAFTS ANGIOGRAPHY N/A 05/22/2018   Procedure: LEFT HEART CATH AND CORS/GRAFTS ANGIOGRAPHY poss PCI;  Surgeon: Minna Merritts, MD;  Location: [facility] CV LAB;  Service: Cardiovascular;   Laterality: N/A;    Prior to Admission medications   Medication Sig Start Date End Date Taking? Authorizing Provider  aspirin EC 81 MG tablet Take 81 mg by mouth at bedtime.     [provider]  atorvastatin (LIPITOR) 80 MG tablet Take 80 mg by mouth at bedtime.    [provider]  blood glucose meter kit and supplies KIT Dispense based on patient and insurance preference. Use up to four times daily as directed. (FOR ICD-9 250.00, 250.01). 05/18/19   Rudene Re, MD  cholecalciferol (VITAMIN D3) 25 MCG (1000 UT) tablet Take 1,000 Units by mouth 2 (two) times daily.    [provider]  clopidogrel (PLAVIX) 75 MG tablet Take 75 mg by mouth daily. 08/15/19   [provider]  furosemide (LASIX) 20 MG tablet Take 1 tablet (20 mg total) by mouth every other day. 10/17/19   Samuella Cota, MD  HYDROcodone-acetaminophen (NORCO) 5-325 MG tablet Take 1 tablet by mouth every 6 (six) hours as needed for moderate pain. 01/17/20   Paulette Blanch, MD  insulin glargine (LANTUS) 100 UNIT/ML injection Inject 24 Units into the skin at bedtime.     [provider]  isosorbide mononitrate (IMDUR) 60 MG 24 hr tablet Take 60 mg by mouth daily. 07/03/18   [provider]  ketorolac (ACULAR) 0.5 % ophthalmic solution Place 1 drop into the right eye 4 (four) times daily. 08/14/18   [provider]  meclizine (ANTIVERT) 50 MG tablet Take 1 tablet (50  mg total) by mouth 2 (two) times daily. 10/15/19   Samuella Cota, MD  metFORMIN (GLUCOPHAGE) 500 MG tablet Take 1,000 mg by mouth 2 (two) times daily. 09/22/19   [provider]  metoprolol succinate (TOPROL-XL) 50 MG 24 hr tablet Take 50 mg by mouth at bedtime.    [provider]  moxifloxacin (VIGAMOX) 0.5 % ophthalmic solution Place 1 drop into the right eye 4 (four) times daily. 08/14/18   [provider]  Multiple Vitamins-Minerals (PRESERVISION AREDS 2+MULTI VIT PO) Take by  mouth.    [provider]  nitroGLYCERIN (NITROSTAT) 0.4 MG SL tablet Place 0.4 mg under the tongue every 5 (five) minutes as needed for chest pain.     [provider]  omeprazole (PRILOSEC) 40 MG capsule Take 40 mg by mouth 2 (two) times daily. 07/19/19   [provider]  prednisoLONE acetate (PRED FORTE) 1 % ophthalmic suspension Place 1 drop into the right eye 4 (four) times daily. 08/14/18   [provider]  sertraline (ZOLOFT) 100 MG tablet Take 150 mg by mouth at bedtime.    [provider]  tamsulosin (FLOMAX) 0.4 MG CAPS capsule Take 0.4 mg by mouth at bedtime.     [provider]    Allergies Sulfamethoxazole-trimethoprim and Tape  Family History  Problem Relation Age of Onset  . Diabetes Mother   . Cancer Mother   . Hypertension Father   . Hypertension Brother   . Diabetes Brother     Social History Social History   Tobacco Use  . Smoking status: Former Smoker    Quit date: 06/19/1984    Years since quitting: 35.6  . Smokeless tobacco: Never Used  Vaping Use  . Vaping Use: Never used  Substance Use Topics  . Alcohol use: No  . Drug use: No    Review of Systems  Constitutional: No fever/chills Eyes: No visual changes. ENT: No sore throat. Cardiovascular: Denies chest pain. Respiratory: Denies shortness of breath. Gastrointestinal: Positive for left flank pain.  No abdominal pain.  No nausea, no vomiting.  No diarrhea.  No constipation. Genitourinary: Positive for hematuria.  Negative for dysuria. Musculoskeletal: Positive for back pain. Skin: Negative for rash. Neurological: Negative for headaches, focal weakness or numbness.   ____________________________________________   PHYSICAL EXAM:  VITAL SIGNS: ED Triage Vitals  Enc Vitals Group     BP 01/16/20 2020 (!) 172/87     Pulse Rate 01/16/20 2020 80     Resp 01/16/20 2020 16     Temp 01/16/20 2020 98.2 F (36.8 C)     Temp Source 01/16/20 2020  Oral     SpO2 01/16/20 2020 98 %     Weight 01/16/20 2021 180 lb 5.4 oz (81.8 kg)     Height 01/16/20 2021 5' 6"  (1.676 m)     Head Circumference --      Peak Flow --      Pain Score 01/16/20 2020 10     Pain Loc --      Pain Edu? --      Excl. in [place]? --     Constitutional: Alert and oriented. Well appearing and in no acute distress. Eyes: Conjunctivae are normal. PERRL. EOMI. Head: Atraumatic. Nose: No congestion/rhinnorhea. Mouth/Throat: Mucous membranes are moist.   Neck: No stridor.   Cardiovascular: Normal rate, regular rhythm. Grossly normal heart sounds.  Good peripheral circulation. Respiratory: Normal respiratory effort.  No retractions. Lungs CTAB. Gastrointestinal: Soft and nontender to  light or deep palpation. No distention. No abdominal bruits. No CVA tenderness. Musculoskeletal: Lumbar spine tenderness to palpation.  No step-offs or deformities noted.  Negative straight leg raise bilaterally.  No lower extremity tenderness nor edema.  No joint effusions. Neurologic:  Normal speech and language. No gross focal neurologic deficits are appreciated. No gait instability. Skin:  Skin is warm, dry and intact. No rash noted.  No vesicles.  Scattered old ecchymosis to bilateral upper extremities. Psychiatric: Mood and affect are normal. Speech and behavior are normal.  ____________________________________________   LABS (all labs ordered are listed, but only abnormal results are displayed)  Labs Reviewed  URINALYSIS, COMPLETE (UACMP) WITH MICROSCOPIC - Abnormal; Notable for the following components:      Result Value   Color, Urine YELLOW (*)    APPearance HAZY (*)    Glucose, UA 50 (*)    Protein, ur 100 (*)    All other components within normal limits  BASIC METABOLIC PANEL - Abnormal; Notable for the following components:   Glucose, Bld 255 (*)    BUN 25 (*)    Creatinine, Ser 1.36 (*)    GFR calc non Af Amer 49 (*)    GFR calc Af Amer 57 (*)    All other  components within normal limits  CBC - Abnormal; Notable for the following components:   Hemoglobin 12.2 (*)    HCT 37.0 (*)    RDW 15.6 (*)    Platelets 112 (*)    All other components within normal limits   ____________________________________________  EKG  None ____________________________________________  RADIOLOGY  ED MD interpretation: Nonobstructing left renal calculus, chronic compression deformities of L1 vertebral bodies  Official radiology report(s): CT Renal Stone Study  Result Date: 01/17/2020 CLINICAL DATA:  Flank pain EXAM: CT ABDOMEN AND PELVIS WITHOUT CONTRAST TECHNIQUE: Multidetector CT imaging of the abdomen and pelvis was performed following the standard protocol without IV contrast. COMPARISON:  August 27, 2019 FINDINGS: Lower chest: Again noted are numerous bilateral pulmonary nodules at the lung bases, some of which are calcified. There is also extensive bilateral hilar calcified lymph nodes and pleural based calcifications. Hepatobiliary: The liver is normal in density without focal abnormality.The main portal vein is patent. No evidence of calcified gallstones, gallbladder wall thickening or biliary dilatation. Pancreas: Unremarkable. No pancreatic ductal dilatation or surrounding inflammatory changes. Spleen: Mild splenomegaly is seen measuring up to 14 cm. Adrenals/Urinary Tract: Both adrenal glands appear normal. There is a 7 mm calculus again noted within the lower pole of the left kidney. A 2 cm low-density lesion seen in the upper pole the right kidney. A left-sided extrarenal pelvis is noted. No collecting system calculi are seen. Again noted is a partially distended bladder with diffuse superior bladder wall thickening. A bladder wall diverticulum is noted. Stomach/Bowel: The stomach, small bowel, and colon are normal in appearance. No inflammatory changes, wall thickening, or obstructive findings. Scattered colonic diverticula are seen. The appendix is normal.  Vascular/Lymphatic: There are no enlarged mesenteric, retroperitoneal, or pelvic lymph nodes. Scattered aortic atherosclerotic calcifications are seen without aneurysmal dilatation. Reproductive: The prostate is unremarkable. Other: No evidence of abdominal wall mass. Small bilateral fat containing inguinal hernias are seen. Musculoskeletal: No acute or significant osseous findings. Chronic superior compression deformity of the L1 vertebral bodies seen with less than 25% loss in height. There is chronic posterior left eighth through eleventh rib fractures. IMPRESSION: 1. Nonobstructing left renal calculus. 2. 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 Mild splenomegaly 4. Diffuse chronic bladder wall thickening which could be due to chronic cystitis 5.  Aortic Atherosclerosis (ICD10-I70.0). Electronically Signed   By: Prudencio Pair M.D.   On: 01/17/2020 03:16    ____________________________________________   PROCEDURES  Procedure(s) performed (including Critical Care):  Procedures   ____________________________________________   INITIAL IMPRESSION / ASSESSMENT AND PLAN / ED COURSE  As part of my medical decision making, I reviewed the following data within the [facility] notes reviewed and incorporated, Labs reviewed, Old chart reviewed, Radiograph reviewed, Notes from prior ED visits and [place] Controlled Substance Database     Peter Robertson was evaluated in Emergency Department on 01/17/2020 for the symptoms described in the history of present illness. He was evaluated in the context of the global COVID-19 pandemic, which necessitated consideration that the patient might be at risk for infection with the SARS-CoV-2 virus that causes COVID-19. Institutional protocols and algorithms that pertain to the evaluation of patients at risk for COVID-19 are in a state of rapid change based on information released by  regulatory bodies including the CDC and federal and state organizations. These policies and algorithms were followed during the patient's care in the ED.    79 year old male presenting with left flank and lower back pain x3 weeks. Differential diagnosis includes, but is not limited to, acute appendicitis, renal colic, testicular torsion, urinary tract infection/pyelonephritis, prostatitis,  epididymitis, diverticulitis, small bowel obstruction or ileus, colitis, abdominal aortic aneurysm, gastroenteritis, hernia, etc.  Laboratory results demonstrate mild AKI.  No obstructing stone seen on CT scan.  Will administer IV fluids, Norco and reassess.   Clinical Course as of Jan 17 656  Sat Jan 17, 2020  0620 Patient feeling better.  IV fluids completed.  Will discharge home on Norco to use as needed.  Strict return precautions given.  Patient verbalizes understanding agrees with plan of care.   [JS]    Clinical Course User Index [JS] Paulette Blanch, MD     ____________________________________________   FINAL CLINICAL IMPRESSION(S) / ED DIAGNOSES  Final diagnoses:  Left flank pain  AKI (acute kidney injury) ([place])  Acute midline low back pain without sciatica     ED Discharge Orders         Ordered    HYDROcodone-acetaminophen (NORCO) 5-325 MG tablet  Every 6 hours PRN     Discontinue  Reprint     01/17/20 1610           Note:  This document was prepared using Dragon voice recognition software and may include unintentional dictation errors.   Paulette Blanch, MD 01/17/20 [phone]

## 2020-02-12 ENCOUNTER — Emergency Department: Payer: Medicare Other

## 2020-02-12 ENCOUNTER — Other Ambulatory Visit: Payer: Self-pay

## 2020-02-12 ENCOUNTER — Inpatient Hospital Stay
Admission: EM | Admit: 2020-02-12 | Discharge: 2020-02-16 | DRG: 092 | Disposition: A | Discharge status: Home Health | Payer: Medicare Other | Attending: Internal Medicine | Admitting: Internal Medicine

## 2020-02-12 DIAGNOSIS — Z882 Allergy status to sulfonamides status: Secondary | ICD-10-CM

## 2020-02-12 DIAGNOSIS — G629 Polyneuropathy, unspecified: Secondary | ICD-10-CM | POA: Diagnosis present

## 2020-02-12 DIAGNOSIS — Z20822 Contact with and (suspected) exposure to covid-19: Secondary | ICD-10-CM | POA: Diagnosis present

## 2020-02-12 DIAGNOSIS — E875 Hyperkalemia: Secondary | ICD-10-CM | POA: Diagnosis present

## 2020-02-12 DIAGNOSIS — I25708 Atherosclerosis of coronary artery bypass graft(s), unspecified, with other forms of angina pectoris: Secondary | ICD-10-CM | POA: Diagnosis not present

## 2020-02-12 DIAGNOSIS — R262 Difficulty in walking, not elsewhere classified: Secondary | ICD-10-CM

## 2020-02-12 DIAGNOSIS — K219 Gastro-esophageal reflux disease without esophagitis: Secondary | ICD-10-CM | POA: Diagnosis present

## 2020-02-12 DIAGNOSIS — I252 Old myocardial infarction: Secondary | ICD-10-CM

## 2020-02-12 DIAGNOSIS — Z833 Family history of diabetes mellitus: Secondary | ICD-10-CM

## 2020-02-12 DIAGNOSIS — Z008 Encounter for other general examination: Secondary | ICD-10-CM

## 2020-02-12 DIAGNOSIS — R161 Splenomegaly, not elsewhere classified: Secondary | ICD-10-CM | POA: Diagnosis present

## 2020-02-12 DIAGNOSIS — H3530 Unspecified macular degeneration: Secondary | ICD-10-CM | POA: Diagnosis present

## 2020-02-12 DIAGNOSIS — Z794 Long term (current) use of insulin: Secondary | ICD-10-CM

## 2020-02-12 DIAGNOSIS — I110 Hypertensive heart disease with heart failure: Secondary | ICD-10-CM | POA: Diagnosis present

## 2020-02-12 DIAGNOSIS — Z951 Presence of aortocoronary bypass graft: Secondary | ICD-10-CM

## 2020-02-12 DIAGNOSIS — R5381 Other malaise: Secondary | ICD-10-CM | POA: Diagnosis not present

## 2020-02-12 DIAGNOSIS — J45909 Unspecified asthma, uncomplicated: Secondary | ICD-10-CM | POA: Diagnosis present

## 2020-02-12 DIAGNOSIS — N179 Acute kidney failure, unspecified: Secondary | ICD-10-CM | POA: Diagnosis present

## 2020-02-12 DIAGNOSIS — M7989 Other specified soft tissue disorders: Secondary | ICD-10-CM | POA: Diagnosis present

## 2020-02-12 DIAGNOSIS — D696 Thrombocytopenia, unspecified: Secondary | ICD-10-CM | POA: Diagnosis present

## 2020-02-12 DIAGNOSIS — N200 Calculus of kidney: Secondary | ICD-10-CM | POA: Diagnosis present

## 2020-02-12 DIAGNOSIS — Z87891 Personal history of nicotine dependence: Secondary | ICD-10-CM

## 2020-02-12 DIAGNOSIS — E1165 Type 2 diabetes mellitus with hyperglycemia: Secondary | ICD-10-CM | POA: Diagnosis present

## 2020-02-12 DIAGNOSIS — M4312 Spondylolisthesis, cervical region: Secondary | ICD-10-CM | POA: Diagnosis present

## 2020-02-12 DIAGNOSIS — R296 Repeated falls: Secondary | ICD-10-CM | POA: Diagnosis present

## 2020-02-12 DIAGNOSIS — K5730 Diverticulosis of large intestine without perforation or abscess without bleeding: Secondary | ICD-10-CM | POA: Diagnosis present

## 2020-02-12 DIAGNOSIS — M25512 Pain in left shoulder: Secondary | ICD-10-CM | POA: Diagnosis present

## 2020-02-12 DIAGNOSIS — F329 Major depressive disorder, single episode, unspecified: Secondary | ICD-10-CM | POA: Diagnosis present

## 2020-02-12 DIAGNOSIS — Z7902 Long term (current) use of antithrombotics/antiplatelets: Secondary | ICD-10-CM

## 2020-02-12 DIAGNOSIS — R531 Weakness: Secondary | ICD-10-CM

## 2020-02-12 DIAGNOSIS — R079 Chest pain, unspecified: Secondary | ICD-10-CM | POA: Diagnosis present

## 2020-02-12 DIAGNOSIS — N400 Enlarged prostate without lower urinary tract symptoms: Secondary | ICD-10-CM | POA: Diagnosis present

## 2020-02-12 DIAGNOSIS — E1129 Type 2 diabetes mellitus with other diabetic kidney complication: Secondary | ICD-10-CM

## 2020-02-12 DIAGNOSIS — N323 Diverticulum of bladder: Secondary | ICD-10-CM | POA: Diagnosis present

## 2020-02-12 DIAGNOSIS — Z91048 Other nonmedicinal substance allergy status: Secondary | ICD-10-CM

## 2020-02-12 DIAGNOSIS — Z79899 Other long term (current) drug therapy: Secondary | ICD-10-CM

## 2020-02-12 DIAGNOSIS — I10 Essential (primary) hypertension: Secondary | ICD-10-CM | POA: Diagnosis not present

## 2020-02-12 DIAGNOSIS — E119 Type 2 diabetes mellitus without complications: Secondary | ICD-10-CM

## 2020-02-12 DIAGNOSIS — E1140 Type 2 diabetes mellitus with diabetic neuropathy, unspecified: Secondary | ICD-10-CM | POA: Diagnosis not present

## 2020-02-12 DIAGNOSIS — D649 Anemia, unspecified: Secondary | ICD-10-CM | POA: Diagnosis present

## 2020-02-12 DIAGNOSIS — R42 Dizziness and giddiness: Secondary | ICD-10-CM | POA: Diagnosis present

## 2020-02-12 DIAGNOSIS — S93402A Sprain of unspecified ligament of left ankle, initial encounter: Secondary | ICD-10-CM | POA: Diagnosis present

## 2020-02-12 DIAGNOSIS — E785 Hyperlipidemia, unspecified: Secondary | ICD-10-CM | POA: Diagnosis present

## 2020-02-12 DIAGNOSIS — E041 Nontoxic single thyroid nodule: Secondary | ICD-10-CM | POA: Diagnosis present

## 2020-02-12 DIAGNOSIS — M4856XA Collapsed vertebra, not elsewhere classified, lumbar region, initial encounter for fracture: Secondary | ICD-10-CM | POA: Diagnosis present

## 2020-02-12 DIAGNOSIS — I5032 Chronic diastolic (congestive) heart failure: Secondary | ICD-10-CM | POA: Diagnosis present

## 2020-02-12 DIAGNOSIS — Z7982 Long term (current) use of aspirin: Secondary | ICD-10-CM

## 2020-02-12 DIAGNOSIS — I2510 Atherosclerotic heart disease of native coronary artery without angina pectoris: Secondary | ICD-10-CM | POA: Diagnosis present

## 2020-02-12 DIAGNOSIS — W19XXXA Unspecified fall, initial encounter: Secondary | ICD-10-CM

## 2020-02-12 DIAGNOSIS — Z8249 Family history of ischemic heart disease and other diseases of the circulatory system: Secondary | ICD-10-CM

## 2020-02-12 DIAGNOSIS — N401 Enlarged prostate with lower urinary tract symptoms: Secondary | ICD-10-CM

## 2020-02-12 DIAGNOSIS — Z87442 Personal history of urinary calculi: Secondary | ICD-10-CM

## 2020-02-12 DIAGNOSIS — I700 Atherosclerosis of aorta: Secondary | ICD-10-CM | POA: Diagnosis present

## 2020-02-12 LAB — URINALYSIS, COMPLETE (UACMP) WITH MICROSCOPIC
Bilirubin Urine: NEGATIVE
Glucose, UA: 50 mg/dL — AB
Hgb urine dipstick: NEGATIVE
Ketones, ur: NEGATIVE mg/dL
Leukocytes,Ua: NEGATIVE
Nitrite: NEGATIVE
Protein, ur: NEGATIVE mg/dL
Specific Gravity, Urine: 1.012 (ref 1.005–1.030)
Squamous Epithelial / HPF: NONE SEEN (ref 0–5)
pH: 5 (ref 5.0–8.0)

## 2020-02-12 LAB — CBC
HCT: 30.4 % — ABNORMAL LOW (ref 39.0–52.0)
Hemoglobin: 10.7 g/dL — ABNORMAL LOW (ref 13.0–17.0)
MCH: 28.4 pg (ref 26.0–34.0)
MCHC: 35.2 g/dL (ref 30.0–36.0)
MCV: 80.6 fL (ref 80.0–100.0)
Platelets: 87 10*3/uL — ABNORMAL LOW (ref 150–400)
RBC: 3.77 MIL/uL — ABNORMAL LOW (ref 4.22–5.81)
RDW: 16.9 % — ABNORMAL HIGH (ref 11.5–15.5)
WBC: 5.7 10*3/uL (ref 4.0–10.5)
nRBC: 0 % (ref 0.0–0.2)

## 2020-02-12 LAB — BASIC METABOLIC PANEL
Anion gap: 10 (ref 5–15)
BUN: 40 mg/dL — ABNORMAL HIGH (ref 8–23)
CO2: 22 mmol/L (ref 22–32)
Calcium: 8.2 mg/dL — ABNORMAL LOW (ref 8.9–10.3)
Chloride: 105 mmol/L (ref 98–111)
Creatinine, Ser: 1.74 mg/dL — ABNORMAL HIGH (ref 0.61–1.24)
GFR calc Af Amer: 42 mL/min — ABNORMAL LOW (ref >60)
GFR calc non Af Amer: 36 mL/min — ABNORMAL LOW (ref >60)
Glucose, Bld: 335 mg/dL — ABNORMAL HIGH (ref 70–99)
Potassium: 4.7 mmol/L (ref 3.5–5.1)
Sodium: 137 mmol/L (ref 135–145)

## 2020-02-12 LAB — HEPATIC FUNCTION PANEL
ALT: 14 U/L (ref 0–44)
AST: 16 U/L (ref 15–41)
Albumin: 3.4 g/dL — ABNORMAL LOW (ref 3.5–5.0)
Alkaline Phosphatase: 68 U/L (ref 38–126)
Bilirubin, Direct: 0.1 mg/dL (ref 0.0–0.2)
Indirect Bilirubin: 0.7 mg/dL (ref 0.3–0.9)
Total Bilirubin: 0.8 mg/dL (ref 0.3–1.2)
Total Protein: 5.7 g/dL — ABNORMAL LOW (ref 6.5–8.1)

## 2020-02-12 LAB — SARS CORONAVIRUS 2 BY RT PCR (HOSPITAL ORDER, PERFORMED IN ~~LOC~~ HOSPITAL LAB): SARS Coronavirus 2: NEGATIVE

## 2020-02-12 LAB — TROPONIN I (HIGH SENSITIVITY)
Troponin I (High Sensitivity): 6 ng/L (ref <18)
Troponin I (High Sensitivity): 9 ng/L (ref <18)

## 2020-02-12 MED ORDER — SODIUM CHLORIDE 0.9 % IV BOLUS
1000.0000 mL | Freq: Once | INTRAVENOUS | Status: AC
  Administered 2020-02-12: 1000 mL via INTRAVENOUS

## 2020-02-12 NOTE — ED Notes (Addendum)
This RN on phone with pt's step-son. This RN given permission to update step-son on POC

## 2020-02-12 NOTE — ED Notes (Signed)
Admitting Provider at bedside. 

## 2020-02-12 NOTE — ED Triage Notes (Signed)
Pt to ED via [facility] from home. Per EMS pt called due to new onset of left sided shoulder pain. Pt stating he has also had pain in left flank, left leg and left ankle. Pt stating recent falls due to feeling weak. Pt initial BP 80/48 after 1L NS improvement to 102/58. CBG 363.   Pt dx with shingles 38mo ago. Pt with hx DM, HTN, CAD and MI.

## 2020-02-12 NOTE — ED Notes (Addendum)
Pt unsteady to toilet, also swaying while standing at urinal  Reports last COVID test approx 2 month ago prior to vaccine

## 2020-02-12 NOTE — ED Notes (Signed)
[place] tray and diet soda given as pt requested  Warm blanket given

## 2020-02-12 NOTE — ED Notes (Signed)
Call from [facility]; [phone]; reports self as [facility] POA (reports paperwork on file for the last 4 years, neighbor to pt)  Reports will not be coming in tonight d/t complications from recent knee surgery, requests to talk to social worker on behalf of pt about SNF pt placement long-term for pt safety  Reports pt hx left left side weakness d/t vertebral compression fractures; multiple falls; macular degeneration; and dementia worsening exponentially over the last year

## 2020-02-12 NOTE — H&P (Signed)
[facility] History and Physical  NEVYN Robertson [name]:[id number] DOB: [birth year] DOA: 02/12/2020  Referring physician: Dr. Cinda Quest PCP: Danae Orleans, MD   Chief Complaint: falls  HPI: Peter Robertson is a 79 y.o. male with history of type 2 diabetes, thrombocytopenia, BPPV, hypertension, CAD status post CABG, HFpEF, sarcoidosis, TIA, splenomegaly, nephrolithiasis, who presents with weakness and frequent falls.  On interview patient states that he presented for multiple issues, including chest pain, left leg swelling, dizziness, and falls.  With gradual chest pain he states it is left-sided, constant, worse with deep inspiration or coughing, and does not feel like angina.  He states that he thinks that the chest pain is due to falling.  With regard to his left leg swelling he admits that it is chronic.  With regards to dizziness and falls he also admits that this is chronic and has been happening for several months.  He thinks it is due to a combination of his left leg giving out and dizziness but is unable to give further details.  He states that he lives alone, that his wife and children have passed away, and he is helped primarily by his neighbor Lorinda Creed.  I spoke with Lorinda Creed on the phone. She, her husband, her brother, and her sister-in-law all help to care for him, she lives a few houses down the road from him and help him with his medications. She has not officially been made his POA. She helps him with his bills and other household tasks. She states he has been unsteady on his feet for the past year. She has been trying to take care of him but he is having increasing needs and she is having health problems of her own. She thinks his vision has deteriorated significantly due to his macular degeneration. She has also been worried about his memory. He has left his stove on, has mentioned he has seen people in suits cutting down his hedges but no one else sees these things, and other  unusual things.   Review chart shows that he saw his cardiologist in June 2021 (note in care everywhere).  During that visit cardiology documented that patient's primary concern was frequent falls and his left leg giving out.  It is noted in their documentation that neither issue was new and both were chronic.  At that time the cardiologist recommended to be admitted to the hospital for further evaluation as well as a consideration of moving to an assisted living facility, he declined at that time.  Another note in care everywhere from October 16, 2019 with Dr. Ouida Sills notes longstanding history of vertigo, falls, as well as mention of a prior thorough work-up by neurology with symptoms thought to be most consistent with BPPV.  Note also made at that time of longstanding thrombocytopenia.  In the ED initial vital signs unremarkable. Lab work-up notable for CMP showing AKI with elevated creatinine of 1.7, and hyperglycemia with glucose 335. Serial troponins were negative. CBC showed stable mild anemia and somewhat worsening of his chronic thrombocytopenia with platelets of 87. UA was unremarkable. EKG was nonischemic and unchanged from prior. CT head and cervical spine showed no acute findings, CT renal stone study showed no acute findings, plain film of the left ankle and shoulder did not show any acute findings, and duplex of the left lower extremity did not show any evidence of DVT. Is given a 1 L normal saline bolus.  He is admitted for further management of his frequent  falls and AKI.  Review of Systems:  Pertinent positives and negative per HPI, all others reviewed and negative   Past Medical History:  Diagnosis Date  . Asthma   . BPH (benign prostatic hyperplasia)   . Chronic kidney disease    Kidney Stones  . Coronary artery disease   . Diabetes mellitus without complication ([place])   . Heart murmur   . Hyperlipidemia   . Hypertension   . Myocardial infarction [facility])    Past Surgical  History:  Procedure Laterality Date  . COLONOSCOPY WITH PROPOFOL N/A 12/03/2014   Procedure: COLONOSCOPY WITH PROPOFOL;  Surgeon: Manya Silvas, MD;  Location: [facility] ENDOSCOPY;  Service: Endoscopy;  Laterality: N/A;  . CORONARY ARTERY BYPASS GRAFT    . LEFT HEART CATH AND CORS/GRAFTS ANGIOGRAPHY N/A 05/22/2018   Procedure: LEFT HEART CATH AND CORS/GRAFTS ANGIOGRAPHY poss PCI;  Surgeon: Minna Merritts, MD;  Location: [place] CV LAB;  Service: Cardiovascular;  Laterality: N/A;   Social History:  reports that he quit smoking about 35 years ago. He has never used smokeless tobacco. He reports that he does not drink alcohol and does not use drugs.  Allergies  Allergen Reactions  . Sulfamethoxazole-Trimethoprim Other (See Comments)    dizziness dizziness dizziness   . Tape Rash    blisters    Family History  Problem Relation Age of Onset  . Diabetes Mother   . Cancer Mother   . Hypertension Father   . Hypertension Brother   . Diabetes Brother     Prior to Admission medications   Medication Sig Start Date End Date Taking? Authorizing Provider  aspirin EC 81 MG tablet Take 81 mg by mouth at bedtime.     [provider]  atorvastatin (LIPITOR) 80 MG tablet Take 80 mg by mouth at bedtime.    [provider]  blood glucose meter kit and supplies KIT Dispense based on patient and insurance preference. Use up to four times daily as directed. (FOR ICD-9 250.00, 250.01). 05/18/19   Rudene Re, MD  cholecalciferol (VITAMIN D3) 25 MCG (1000 UT) tablet Take 1,000 Units by mouth 2 (two) times daily.    [provider]  clopidogrel (PLAVIX) 75 MG tablet Take 75 mg by mouth daily. 08/15/19   [provider]  furosemide (LASIX) 20 MG tablet Take 1 tablet (20 mg total) by mouth every other day. 10/17/19   Samuella Cota, MD  HYDROcodone-acetaminophen (NORCO) 5-325 MG tablet Take 1 tablet by mouth every 6 (six) hours as needed for moderate pain.  01/17/20   Paulette Blanch, MD  insulin glargine (LANTUS) 100 UNIT/ML injection Inject 24 Units into the skin at bedtime.     [provider]  isosorbide mononitrate (IMDUR) 60 MG 24 hr tablet Take 60 mg by mouth daily. 07/03/18   [provider]  ketorolac (ACULAR) 0.5 % ophthalmic solution Place 1 drop into the right eye 4 (four) times daily. 08/14/18   [provider]  meclizine (ANTIVERT) 50 MG tablet Take 1 tablet (50 mg total) by mouth 2 (two) times daily. 10/15/19   Samuella Cota, MD  metFORMIN (GLUCOPHAGE) 500 MG tablet Take 1,000 mg by mouth 2 (two) times daily. 09/22/19   [provider]  metoprolol succinate (TOPROL-XL) 50 MG 24 hr tablet Take 50 mg by mouth at bedtime.    [provider]  moxifloxacin (VIGAMOX) 0.5 % ophthalmic solution Place 1 drop into the right eye 4 (four) times daily. 08/14/18  [provider]  Multiple Vitamins-Minerals (PRESERVISION AREDS 2+MULTI VIT PO) Take by mouth.    [provider]  nitroGLYCERIN (NITROSTAT) 0.4 MG SL tablet Place 0.4 mg under the tongue every 5 (five) minutes as needed for chest pain.     [provider]  omeprazole (PRILOSEC) 40 MG capsule Take 40 mg by mouth 2 (two) times daily. 07/19/19   [provider]  prednisoLONE acetate (PRED FORTE) 1 % ophthalmic suspension Place 1 drop into the right eye 4 (four) times daily. 08/14/18   [provider]  sertraline (ZOLOFT) 100 MG tablet Take 150 mg by mouth at bedtime.    [provider]  tamsulosin (FLOMAX) 0.4 MG CAPS capsule Take 0.4 mg by mouth at bedtime.     [provider]   Physical Exam: Vitals:   02/12/20 1715 02/12/20 1730 02/12/20 1800 02/12/20 2001  BP:  102/61 101/63 112/67  Pulse: 77 75 73 74  Resp: 17 17 18 18   Temp:      TempSrc:      SpO2: 96% 98% 100% 98%  Weight:      Height:        Wt Readings from Last 3 Encounters:  02/12/20 81.8 kg  01/16/20 81.8 kg  10/10/19  81.8 kg    General:  Appears calm and comfortable Eyes: PERRL, normal lids, irises & conjunctiva ENT: grossly normal hearing, lips & tongue Neck: no LAD, masses or thyromegaly Cardiovascular: RRR, no m/r/g. Bilateral LE non pitting edema. Respiratory: CTA bilaterally, no w/r/r. Normal respiratory effort. Abdomen: soft, ntnd Skin: no rash or induration seen on limited exam Musculoskeletal: grossly normal tone BUE/BLE Psychiatric: grossly normal mood and affect, speech fluent and appropriate Neurologic: grossly non-focal.          Labs on Admission:  Basic Metabolic Panel: Recent Labs  Lab 02/12/20 1624  NA 137  K 4.7  CL 105  CO2 22  GLUCOSE 335*  BUN 40*  CREATININE 1.74*  CALCIUM 8.2*   Liver Function Tests: Recent Labs  Lab 02/12/20 1630  AST 16  ALT 14  ALKPHOS 68  BILITOT 0.8  PROT 5.7*  ALBUMIN 3.4*   No results for input(s): LIPASE, AMYLASE in the last 168 hours. No results for input(s): AMMONIA in the last 168 hours. CBC: Recent Labs  Lab 02/12/20 1624  WBC 5.7  HGB 10.7*  HCT 30.4*  MCV 80.6  PLT 87*   Cardiac Enzymes: No results for input(s): CKTOTAL, CKMB, CKMBINDEX, TROPONINI in the last 168 hours.  BNP (last 3 results) No results for input(s): BNP in the last 8760 hours.  ProBNP (last 3 results) No results for input(s): PROBNP in the last 8760 hours.  CBG: No results for input(s): GLUCAP in the last 168 hours.  Radiological Exams on Admission: DG Ankle Complete Left  Result Date: 02/12/2020 CLINICAL DATA:  Initial evaluation for acute trauma, fall, pain. EXAM: LEFT ANKLE COMPLETE - 3+ VIEW COMPARISON:  None available. FINDINGS: No acute fracture dislocation. Ankle mortise approximated. Talar dome intact. Posterior plantar calcaneal enthesophytes noted. Mild osteopenia. Diffuse soft tissue swelling present about the ankle. IMPRESSION: 1. No acute fracture or dislocation. 2. Diffuse soft tissue swelling about the ankle. Electronically  Signed   By: Jeannine Boga M.D.   On: 02/12/2020 19:59   CT Head Wo Contrast  Result Date: 02/12/2020 CLINICAL DATA:  Headaches and left-sided shoulder pain, history of recent falls, initial encounter EXAM: CT HEAD WITHOUT CONTRAST CT CERVICAL SPINE WITHOUT CONTRAST  TECHNIQUE: Multidetector CT imaging of the head and cervical spine was performed following the standard protocol without intravenous contrast. Multiplanar CT image reconstructions of the cervical spine were also generated. COMPARISON:  04/07/2019 FINDINGS: CT HEAD FINDINGS Brain: No evidence of acute infarction, hemorrhage, hydrocephalus, extra-axial collection or mass lesion/mass effect. Mild atrophic and chronic white matter ischemic changes are noted. Vascular: No hyperdense vessel or unexpected calcification. Skull: Normal. Negative for fracture or focal lesion. Sinuses/Orbits: No acute finding. Other: None. CT CERVICAL SPINE FINDINGS Alignment: Mild straightening of the normal cervical lordosis is noted. Skull base and vertebrae: Mild degenerative anterolisthesis of C4 on C5 is noted. Disc space narrowing is noted at C4-5, C5-6 and C6-7. Associated osteophytic changes are noted at these levels as well. Mild facet hypertrophic changes are noted without acute abnormality. No acute fracture or acute facet abnormality is seen. Soft tissues and spinal canal: There is a hypodensity identified within the right lobe of the thyroid measuring 17 mm. This is stable in appearance from the prior exam. Upper chest: Scarring is noted in the upper lung fields bilaterally. This is stable from the prior study. Other: None IMPRESSION: CT of the head: Chronic atrophic and ischemic changes without acute abnormality. CT of the cervical spine: Degenerative change without acute abnormality. Right thyroid nodule measuring 17 mm. No follow-up recommended unless clinically warranted (ref: J Am Coll Radiol. 2015 Feb;12(2): 143-50). Electronically Signed   By: Inez Catalina M.D.   On: 02/12/2020 19:06   CT Cervical Spine Wo Contrast  Result Date: 02/12/2020 CLINICAL DATA:  Headaches and left-sided shoulder pain, history of recent falls, initial encounter EXAM: CT HEAD WITHOUT CONTRAST CT CERVICAL SPINE WITHOUT CONTRAST TECHNIQUE: Multidetector CT imaging of the head and cervical spine was performed following the standard protocol without intravenous contrast. Multiplanar CT image reconstructions of the cervical spine were also generated. COMPARISON:  04/07/2019 FINDINGS: CT HEAD FINDINGS Brain: No evidence of acute infarction, hemorrhage, hydrocephalus, extra-axial collection or mass lesion/mass effect. Mild atrophic and chronic white matter ischemic changes are noted. Vascular: No hyperdense vessel or unexpected calcification. Skull: Normal. Negative for fracture or focal lesion. Sinuses/Orbits: No acute finding. Other: None. CT CERVICAL SPINE FINDINGS Alignment: Mild straightening of the normal cervical lordosis is noted. Skull base and vertebrae: Mild degenerative anterolisthesis of C4 on C5 is noted. Disc space narrowing is noted at C4-5, C5-6 and C6-7. Associated osteophytic changes are noted at these levels as well. Mild facet hypertrophic changes are noted without acute abnormality. No acute fracture or acute facet abnormality is seen. Soft tissues and spinal canal: There is a hypodensity identified within the right lobe of the thyroid measuring 17 mm. This is stable in appearance from the prior exam. Upper chest: Scarring is noted in the upper lung fields bilaterally. This is stable from the prior study. Other: None IMPRESSION: CT of the head: Chronic atrophic and ischemic changes without acute abnormality. CT of the cervical spine: Degenerative change without acute abnormality. Right thyroid nodule measuring 17 mm. No follow-up recommended unless clinically warranted (ref: J Am Coll Radiol. 2015 Feb;12(2): 143-50). Electronically Signed   By: Inez Catalina M.D.   On:  02/12/2020 19:06   US Venous Img Lower Unilateral Left  Result Date: 02/12/2020 CLINICAL DATA:  Pain and swelling for 1 year. EXAM: LEFT LOWER EXTREMITY VENOUS DOPPLER ULTRASOUND TECHNIQUE: Gray-scale sonography with compression, as well as color and duplex ultrasound, were performed to evaluate the deep venous system(s) from the level of the common femoral vein through  the popliteal and proximal calf veins. COMPARISON:  Venous ultrasound 02/07/2016 FINDINGS: VENOUS Normal compressibility of the common femoral, superficial femoral, and popliteal veins, as well as the visualized calf veins. Visualized portions of profunda femoral vein and great saphenous vein unremarkable. No filling defects to suggest DVT on grayscale or color Doppler imaging. Doppler waveforms show normal direction of venous flow, normal respiratory plasticity and response to augmentation. Limited views of the contralateral common femoral vein are unremarkable. OTHER None. Limitations: None IMPRESSION: No evidence of left lower extremity DVT. Electronically Signed   By: Keith Rake M.D.   On: 02/12/2020 20:25   DG Chest Portable 1 View  Result Date: 02/12/2020 CLINICAL DATA:  Weakness, frequent fall, history of sarcoid EXAM: PORTABLE CHEST 1 VIEW COMPARISON:  05/20/2018, 01/30/2017 CT chest 08/27/2019 FINDINGS: Post sternotomy changes. Diffusely abnormal appearance of the lung parenchyma with calcified and noncalcified lung nodules. Irregular foci of opacity bilaterally, increased radiographically since 2019. Stable cardiomediastinal silhouette. No pneumothorax. IMPRESSION: Diffusely abnormal appearance of the lung parenchyma with calcified and noncalcified lung nodules. Overall increased bilateral irregular foci of airspace disease, either reflecting acute superimposed infectious process including viral pneumonia versus disease progression. Electronically Signed   By: Donavan Foil M.D.   On: 02/12/2020 18:42   DG Shoulder  Left  Result Date: 02/12/2020 CLINICAL DATA:  Initially a shin for acute pain status post recent trauma, fall. EXAM: LEFT SHOULDER - 2+ VIEW COMPARISON:  Prior radiograph from 08/27/2019. FINDINGS: No acute fracture dislocation. Humeral head in normal alignment with the glenoid. AC joint approximated. Mild osteoarthritic changes present about the shoulder. No periarticular calcification. No visible soft tissue injury. Mild osteopenia. IMPRESSION: No acute osseous abnormality about the left shoulder. Electronically Signed   By: Jeannine Boga M.D.   On: 02/12/2020 20:01   CT Renal Stone Study  Result Date: 02/12/2020 CLINICAL DATA:  Flank pain. Kidney stone suspected. History of stones. Patient reports weakness and frequent falls. Left-sided abdominal pain. EXAM: CT ABDOMEN AND PELVIS WITHOUT CONTRAST TECHNIQUE: Multidetector CT imaging of the abdomen and pelvis was performed following the standard protocol without IV contrast. COMPARISON:  CT 1 month ago 01/17/2020, additional priors FINDINGS: Lower chest: Stable pleural and diaphragmatic calcifications and calcified nodules. Calcified lower mediastinal and paraesophageal nodes are unchanged. No acute airspace disease. No pleural fluid. Heart is normal in size. There are coronary artery calcifications. Hepatobiliary: No focal hepatic abnormality on noncontrast exam. Equivocal capsular nodularity. Gallbladder physiologically distended, no calcified stone. No biliary dilatation. Pancreas: No ductal dilatation or inflammation. Stable coarse calcifications in the pancreatic head. Spleen: Chronic splenomegaly measuring up to 15.7 cm AP. No focal abnormality. Adrenals/Urinary Tract: Stable adrenal thickening without dominant nodule. There is chronic dilatation of the left renal pelvis. No evidence of ureteral calculi. 8 mm nonobstructing stone in the lower left kidney. There is no right hydronephrosis or stone. Cyst in the right kidney is not as well  visualized in the absence of IV contrast. Mild chronic bladder wall thickening with left anterior bladder diverticulum unchanged. Stomach/Bowel: Patulous distal esophagus. Nondistended stomach. Normal positioning of the duodenum and ligament of Treitz. There is no small bowel obstruction or inflammatory change. Normal appendix. Small volume of stool in the proximal colon. The distal colon is decompressed. Occasional descending and sigmoid colonic diverticula without diverticulitis. Mild sigmoid colonic tortuosity. Vascular/Lymphatic: Aortic atherosclerosis. No aortic aneurysm. There are small retroperitoneal nodes that are better assessed on prior contrast-enhanced exam. No evidence of bulky adenopathy. Reproductive: Enlarged prostate gland spans 6.4  cm and causes mass effect on the bladder base. Other: No ascites.  No free air.  Fat in both inguinal canals. Musculoskeletal: Chronic L1 compression fracture. This is unchanged from prior exams. Remote posterior left 10 rib fracture. There are no acute or suspicious osseous abnormalities. IMPRESSION: 1. Nonobstructing 8 mm stone in the lower left kidney. Chronic prominence of the left renal pelvis without evidence of obstructing stone or ureteral calculus. 2. Chronic bladder wall thickening and left anterior bladder diverticulum. Enlarged prostate gland causing mass effect on the bladder base. 3. Equivocal capsular nodularity of the liver, can be seen with cirrhosis. Chronic splenomegaly. 4. Colonic diverticulosis without diverticulitis. Aortic Atherosclerosis (ICD10-I70.0). Electronically Signed   By: Keith Rake M.D.   On: 02/12/2020 19:14    EKG: Independently reviewed.  Sinus rhythm, right bundle branch block, no ischemic changes, compared to prior no changes.  Assessment/Plan Active Problems:   CAD (coronary artery disease)   Diabetes mellitus (HCC)   Thrombocytopenia (HCC)   AKI (acute kidney injury) ([place])   #Physical deconditioning #Frequent  falls #?Cognitive decline? Patient presenting with prolonged history and physical decline with frequent falls.  In addition collateral history from neighbors concerning for cognitive decline as well.  During my interview I brought up the possibility of rehab and possible assisted living, patient was open to rehab but was not interested in leaving his home.  This will require significant further discussion. -PT and OT consults -Transition of care consult for evaluation of current living situation and dispo planning  #AKI Baseline creatinine over the past several months appears to be somewhere between 1.1-1.3, currently 1.7 on admission.  Suspect prerenal etiology, status post 1 L normal saline bolus in the ED, will put on maintenance fluids overnight and reassess. -Trend BMP  #DM2 Home regimen Lantus 38 units with sliding scale insulin as well as Metformin. -Lantus 20 units nightly -Lispro 4 units 3 times daily with sliding scale insulin -Evaluate need for up titration of regimen daily  #Thrombocytopenia Review of chart shows thrombocytopenia is longstanding going back at least several years.  Does not appear to have seen hematology from what I can see in the chart.  Unclear etiology.  However, platelets usually stay above 100.  Recheck in a.m., if continue to be below 100 would get hematology consult.  #Chronic medical problems Hyperlipidemia: Continue atorvastatin CAD: Continue Plavix, nitro as needed Chronic lower extremity edema: Continue every other day Lasix Neuropathy: Continue gabapentin Hypertension: Continue Imdur, metoprolol GERD: Continue PPI Depression: Continue sertraline BPH: Continue Flomax  Code Status: Full Code, confirmed  DVT Prophylaxis: Lovenox Family Communication: None. Collateral obtained from neighbor Lorinda Creed Disposition Plan: Inpatient  Time spent: 50 min  Clarnce Flock MD/[facility]

## 2020-02-12 NOTE — ED Provider Notes (Addendum)
 [facility] Emergency Department Provider Note   ____________________________________________   First MD Initiated Contact with Patient 02/12/20 1625     (approximate)  I have reviewed the triage vital signs and the nursing notes.   HISTORY  Chief Complaint Weakness  HPI Peter Robertson is a 79 y.o. male who reports frequent falls for several weeks or months.  He had shingles a month ago.  He has a history of diabetes hypertension heart disease.  Reports he is betting getting weak and falling a lot.  He is hit his head.  He has hit his left side.  He has pain in his left shoulder left flank left ankle and left leg.  He reports sometimes he is so weak he cannot quite make it to the bathroom.        Past Medical History:  Diagnosis Date  . Asthma   . BPH (benign prostatic hyperplasia)   . Chronic kidney disease    Kidney Stones  . Coronary artery disease   . Diabetes mellitus without complication (HCC)   . Heart murmur   . Hyperlipidemia   . Hypertension   . Myocardial infarction (HCC)     Patient Active Problem List   Diagnosis Date Noted  . Vertigo 10/11/2019  . BPPV (benign paroxysmal positional vertigo) 10/10/2019  . CAD (coronary artery disease) 10/10/2019  . Diabetes mellitus (HCC) 10/10/2019  . BPH (benign prostatic hyperplasia) 10/10/2019  . Thrombocytopenia (HCC) 10/10/2019  . Dark stools 10/10/2019  . Unstable angina (HCC) 05/20/2018  . Chest pain 01/30/2017    Past Surgical History:  Procedure Laterality Date  . COLONOSCOPY WITH PROPOFOL N/A 12/03/2014   Procedure: COLONOSCOPY WITH PROPOFOL;  Surgeon: Robert T Elliott, MD;  Location: [facility] ENDOSCOPY;  Service: Endoscopy;  Laterality: N/A;  . CORONARY ARTERY BYPASS GRAFT    . LEFT HEART CATH AND CORS/GRAFTS ANGIOGRAPHY N/A 05/22/2018   Procedure: LEFT HEART CATH AND CORS/GRAFTS ANGIOGRAPHY poss PCI;  Surgeon: Gollan, Timothy J, MD;  Location: [facility] INVASIVE CV LAB;  Service:  Cardiovascular;  Laterality: N/A;    Prior to Admission medications   Medication Sig Start Date End Date Taking? Authorizing Provider  aspirin EC 81 MG tablet Take 81 mg by mouth at bedtime.     [provider]  atorvastatin (LIPITOR) 80 MG tablet Take 80 mg by mouth at bedtime.    [provider]  blood glucose meter kit and supplies KIT Dispense based on patient and insurance preference. Use up to four times daily as directed. (FOR ICD-9 250.00, 250.01). 05/18/19   Veronese, [place], MD  cholecalciferol (VITAMIN D3) 25 MCG (1000 UT) tablet Take 1,000 Units by mouth 2 (two) times daily.    [provider]  clopidogrel (PLAVIX) 75 MG tablet Take 75 mg by mouth daily. 08/15/19   [provider]  furosemide (LASIX) 20 MG tablet Take 1 tablet (20 mg total) by mouth every other day. 10/17/19   Goodrich, Daniel P, MD  HYDROcodone-acetaminophen (NORCO) 5-325 MG tablet Take 1 tablet by mouth every 6 (six) hours as needed for moderate pain. 01/17/20   Sung, Jade J, MD  insulin glargine (LANTUS) 100 UNIT/ML injection Inject 24 Units into the skin at bedtime.     [provider]  isosorbide mononitrate (IMDUR) 60 MG 24 hr tablet Take 60 mg by mouth daily. 07/03/18   [provider]  ketorolac (ACULAR) 0.5 % ophthalmic solution Place 1 drop into the right eye 4 (four) times daily.   08/14/18   [provider]  meclizine (ANTIVERT) 50 MG tablet Take 1 tablet (50 mg total) by mouth 2 (two) times daily. 10/15/19   Samuella Cota, MD  metFORMIN (GLUCOPHAGE) 500 MG tablet Take 1,000 mg by mouth 2 (two) times daily. 09/22/19   [provider]  metoprolol succinate (TOPROL-XL) 50 MG 24 hr tablet Take 50 mg by mouth at bedtime.    [provider]  moxifloxacin (VIGAMOX) 0.5 % ophthalmic solution Place 1 drop into the right eye 4 (four) times daily. 08/14/18   [provider]  Multiple Vitamins-Minerals (PRESERVISION AREDS 2+MULTI VIT  PO) Take by mouth.    [provider]  nitroGLYCERIN (NITROSTAT) 0.4 MG SL tablet Place 0.4 mg under the tongue every 5 (five) minutes as needed for chest pain.     [provider]  omeprazole (PRILOSEC) 40 MG capsule Take 40 mg by mouth 2 (two) times daily. 07/19/19   [provider]  prednisoLONE acetate (PRED FORTE) 1 % ophthalmic suspension Place 1 drop into the right eye 4 (four) times daily. 08/14/18   [provider]  sertraline (ZOLOFT) 100 MG tablet Take 150 mg by mouth at bedtime.    [provider]  tamsulosin (FLOMAX) 0.4 MG CAPS capsule Take 0.4 mg by mouth at bedtime.     [provider]    Allergies Sulfamethoxazole-trimethoprim and Tape  Family History  Problem Relation Age of Onset  . Diabetes Mother   . Cancer Mother   . Hypertension Father   . Hypertension Brother   . Diabetes Brother     Social History Social History   Tobacco Use  . Smoking status: Former Smoker    Quit date: 06/19/1984    Years since quitting: 35.6  . Smokeless tobacco: Never Used  Vaping Use  . Vaping Use: Never used  Substance Use Topics  . Alcohol use: No  . Drug use: No    Review of Systems  Constitutional: No fever/chills Eyes: No visual changes. ENT: No sore throat. Cardiovascular: Denies chest pain. Respiratory: Denies shortness of breath. Gastrointestinal:  abdominal pain.  No nausea, no vomiting.  No diarrhea.  No constipation. Genitourinary: Negative for dysuria. Musculoskeletal: Patient has some low back pain midline. Skin: Negative for rash. Neurological: Negative for headaches, focal weakness   ____________________________________________   PHYSICAL EXAM:  VITAL SIGNS: ED Triage Vitals  Enc Vitals Group     BP 02/12/20 1620 109/60     Pulse Rate 02/12/20 1620 80     Resp 02/12/20 1620 18     Temp 02/12/20 1620 (!) 97.5 F (36.4 C)     Temp Source 02/12/20 1620 Oral     SpO2 02/12/20 1620 100 %     Weight  02/12/20 1622 180 lb 5.4 oz (81.8 kg)     Height 02/12/20 1622 5' 6" (1.676 m)     Head Circumference --      Peak Flow --      Pain Score 02/12/20 1622 10     Pain Loc --      Pain Edu? --      Excl. in [place]? --     Constitutional: Alert and oriented. Well appearing and in no acute distress. Eyes: Conjunctivae are normal.  Head: Atraumatic. Nose: No congestion/rhinnorhea. Mouth/Throat: Mucous membranes are moist.  Oropharynx non-erythematous. Neck: No stridor.   Cardiovascular: Normal rate, regular rhythm. Grossly normal heart sounds.  Good peripheral circulation. Respiratory: Normal respiratory effort.  No retractions.  Lungs CTAB. Gastrointestinal: Soft tender to palpation on the left side including the left CVA area.  No distention. No abdominal bruits. Musculoskeletal: No lower extremity tenderness nor edema.   Neurologic:  Normal speech and language. No gross focal neurologic deficits are appreciated. Skin:  Skin is warm, dry and intact. No rash noted.   ____________________________________________   LABS (all labs ordered are listed, but only abnormal results are displayed)  Labs Reviewed  BASIC METABOLIC PANEL - Abnormal; Notable for the following components:      Result Value   Glucose, Bld 335 (*)    BUN 40 (*)    Creatinine, Ser 1.74 (*)    Calcium 8.2 (*)    GFR calc non Af Amer 36 (*)    GFR calc Af Amer 42 (*)    All other components within normal limits  CBC - Abnormal; Notable for the following components:   RBC 3.77 (*)    Hemoglobin 10.7 (*)    HCT 30.4 (*)    RDW 16.9 (*)    Platelets 87 (*)    All other components within normal limits  URINALYSIS, COMPLETE (UACMP) WITH MICROSCOPIC - Abnormal; Notable for the following components:   Color, Urine YELLOW (*)    APPearance HAZY (*)    Glucose, UA 50 (*)    Bacteria, UA RARE (*)    All other components within normal limits  HEPATIC FUNCTION PANEL - Abnormal; Notable for the following components:    Total Protein 5.7 (*)    Albumin 3.4 (*)    All other components within normal limits  SARS CORONAVIRUS 2 BY RT PCR (HOSPITAL ORDER, PERFORMED IN [place] [facility])  CBG MONITORING, ED  TROPONIN I (HIGH SENSITIVITY)  TROPONIN I (HIGH SENSITIVITY)   ____________________________________________  EKG  EKG read interpreted by me shows normal sinus rhythm rate of 79 right bundle branch block normal axis. There is some PR segment depression in V2 and V4. Aside from that this EKG looks very similar to 1 from March. ____________________________________________  RADIOLOGY  ED MD interpretation:    Official radiology report(s): DG Ankle Complete Left  Result Date: 02/12/2020 CLINICAL DATA:  Initial evaluation for acute trauma, fall, pain. EXAM: LEFT ANKLE COMPLETE - 3+ VIEW COMPARISON:  None available. FINDINGS: No acute fracture dislocation. Ankle mortise approximated. Talar dome intact. Posterior plantar calcaneal enthesophytes noted. Mild osteopenia. Diffuse soft tissue swelling present about the ankle. IMPRESSION: 1. No acute fracture or dislocation. 2. Diffuse soft tissue swelling about the ankle. Electronically Signed   By: Benjamin  McClintock M.D.   On: 02/12/2020 19:59   CT Head Wo Contrast  Result Date: 02/12/2020 CLINICAL DATA:  Headaches and left-sided shoulder pain, history of recent falls, initial encounter EXAM: CT HEAD WITHOUT CONTRAST CT CERVICAL SPINE WITHOUT CONTRAST TECHNIQUE: Multidetector CT imaging of the head and cervical spine was performed following the standard protocol without intravenous contrast. Multiplanar CT image reconstructions of the cervical spine were also generated. COMPARISON:  04/07/2019 FINDINGS: CT HEAD FINDINGS Brain: No evidence of acute infarction, hemorrhage, hydrocephalus, extra-axial collection or mass lesion/mass effect. Mild atrophic and chronic white matter ischemic changes are noted. Vascular: No hyperdense vessel or unexpected  calcification. Skull: Normal. Negative for fracture or focal lesion. Sinuses/Orbits: No acute finding. Other: None. CT CERVICAL SPINE FINDINGS Alignment: Mild straightening of the normal cervical lordosis is noted. Skull base and vertebrae: Mild degenerative anterolisthesis of C4 on C5 is noted. Disc space narrowing is noted at C4-5, C5-6 and C6-7.   Associated osteophytic changes are noted at these levels as well. Mild facet hypertrophic changes are noted without acute abnormality. No acute fracture or acute facet abnormality is seen. Soft tissues and spinal canal: There is a hypodensity identified within the right lobe of the thyroid measuring 17 mm. This is stable in appearance from the prior exam. Upper chest: Scarring is noted in the upper lung fields bilaterally. This is stable from the prior study. Other: None IMPRESSION: CT of the head: Chronic atrophic and ischemic changes without acute abnormality. CT of the cervical spine: Degenerative change without acute abnormality. Right thyroid nodule measuring 17 mm. No follow-up recommended unless clinically warranted (ref: J Am Coll Radiol. 2015 Feb;12(2): 143-50). Electronically Signed   By: Inez Catalina M.D.   On: 02/12/2020 19:06   CT Cervical Spine Wo Contrast  Result Date: 02/12/2020 CLINICAL DATA:  Headaches and left-sided shoulder pain, history of recent falls, initial encounter EXAM: CT HEAD WITHOUT CONTRAST CT CERVICAL SPINE WITHOUT CONTRAST TECHNIQUE: Multidetector CT imaging of the head and cervical spine was performed following the standard protocol without intravenous contrast. Multiplanar CT image reconstructions of the cervical spine were also generated. COMPARISON:  04/07/2019 FINDINGS: CT HEAD FINDINGS Brain: No evidence of acute infarction, hemorrhage, hydrocephalus, extra-axial collection or mass lesion/mass effect. Mild atrophic and chronic white matter ischemic changes are noted. Vascular: No hyperdense vessel or unexpected calcification.  Skull: Normal. Negative for fracture or focal lesion. Sinuses/Orbits: No acute finding. Other: None. CT CERVICAL SPINE FINDINGS Alignment: Mild straightening of the normal cervical lordosis is noted. Skull base and vertebrae: Mild degenerative anterolisthesis of C4 on C5 is noted. Disc space narrowing is noted at C4-5, C5-6 and C6-7. Associated osteophytic changes are noted at these levels as well. Mild facet hypertrophic changes are noted without acute abnormality. No acute fracture or acute facet abnormality is seen. Soft tissues and spinal canal: There is a hypodensity identified within the right lobe of the thyroid measuring 17 mm. This is stable in appearance from the prior exam. Upper chest: Scarring is noted in the upper lung fields bilaterally. This is stable from the prior study. Other: None IMPRESSION: CT of the head: Chronic atrophic and ischemic changes without acute abnormality. CT of the cervical spine: Degenerative change without acute abnormality. Right thyroid nodule measuring 17 mm. No follow-up recommended unless clinically warranted (ref: J Am Coll Radiol. 2015 Feb;12(2): 143-50). Electronically Signed   By: Inez Catalina M.D.   On: 02/12/2020 19:06   DG Chest Portable 1 View  Result Date: 02/12/2020 CLINICAL DATA:  Weakness, frequent fall, history of sarcoid EXAM: PORTABLE CHEST 1 VIEW COMPARISON:  05/20/2018, 01/30/2017 CT chest 08/27/2019 FINDINGS: Post sternotomy changes. Diffusely abnormal appearance of the lung parenchyma with calcified and noncalcified lung nodules. Irregular foci of opacity bilaterally, increased radiographically since 2019. Stable cardiomediastinal silhouette. No pneumothorax. IMPRESSION: Diffusely abnormal appearance of the lung parenchyma with calcified and noncalcified lung nodules. Overall increased bilateral irregular foci of airspace disease, either reflecting acute superimposed infectious process including viral pneumonia versus disease progression.  Electronically Signed   By: Donavan Foil M.D.   On: 02/12/2020 18:42   DG Shoulder Left  Result Date: 02/12/2020 CLINICAL DATA:  Initially a shin for acute pain status post recent trauma, fall. EXAM: LEFT SHOULDER - 2+ VIEW COMPARISON:  Prior radiograph from 08/27/2019. FINDINGS: No acute fracture dislocation. Humeral head in normal alignment with the glenoid. AC joint approximated. Mild osteoarthritic changes present about the shoulder. No periarticular calcification. No visible soft tissue  injury. Mild osteopenia. IMPRESSION: No acute osseous abnormality about the left shoulder. Electronically Signed   By: Benjamin  McClintock M.D.   On: 02/12/2020 20:01   CT Renal Stone Study  Result Date: 02/12/2020 CLINICAL DATA:  Flank pain. Kidney stone suspected. History of stones. Patient reports weakness and frequent falls. Left-sided abdominal pain. EXAM: CT ABDOMEN AND PELVIS WITHOUT CONTRAST TECHNIQUE: Multidetector CT imaging of the abdomen and pelvis was performed following the standard protocol without IV contrast. COMPARISON:  CT 1 month ago 01/17/2020, additional priors FINDINGS: Lower chest: Stable pleural and diaphragmatic calcifications and calcified nodules. Calcified lower mediastinal and paraesophageal nodes are unchanged. No acute airspace disease. No pleural fluid. Heart is normal in size. There are coronary artery calcifications. Hepatobiliary: No focal hepatic abnormality on noncontrast exam. Equivocal capsular nodularity. Gallbladder physiologically distended, no calcified stone. No biliary dilatation. Pancreas: No ductal dilatation or inflammation. Stable coarse calcifications in the pancreatic head. Spleen: Chronic splenomegaly measuring up to 15.7 cm AP. No focal abnormality. Adrenals/Urinary Tract: Stable adrenal thickening without dominant nodule. There is chronic dilatation of the left renal pelvis. No evidence of ureteral calculi. 8 mm nonobstructing stone in the lower left kidney. There  is no right hydronephrosis or stone. Cyst in the right kidney is not as well visualized in the absence of IV contrast. Mild chronic bladder wall thickening with left anterior bladder diverticulum unchanged. Stomach/Bowel: Patulous distal esophagus. Nondistended stomach. Normal positioning of the duodenum and ligament of Treitz. There is no small bowel obstruction or inflammatory change. Normal appendix. Small volume of stool in the proximal colon. The distal colon is decompressed. Occasional descending and sigmoid colonic diverticula without diverticulitis. Mild sigmoid colonic tortuosity. Vascular/Lymphatic: Aortic atherosclerosis. No aortic aneurysm. There are small retroperitoneal nodes that are better assessed on prior contrast-enhanced exam. No evidence of bulky adenopathy. 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 Other: No ascites.  No free air.  Fat in both inguinal canals. Musculoskeletal: Chronic L1 compression fracture. This is unchanged from prior exams. Remote posterior left 10 rib fracture. There are no acute or suspicious osseous abnormalities. IMPRESSION: 1. Nonobstructing 8 mm stone in the lower left kidney. Chronic prominence of the left renal pelvis without evidence of obstructing stone or ureteral calculus. 2. Chronic bladder wall thickening and left anterior bladder diverticulum. Enlarged prostate gland causing mass effect on the bladder base. 3. Equivocal capsular nodularity of the liver, can be seen with cirrhosis. Chronic splenomegaly. 4. Colonic diverticulosis without diverticulitis. Aortic Atherosclerosis (ICD10-I70.0). Electronically Signed   By: Melanie  Sanford M.D.   On: 02/12/2020 19:14    ____________________________________________   PROCEDURES  Procedure(s) performed (including Critical Care):  Procedures   ____________________________________________   INITIAL IMPRESSION / ASSESSMENT AND PLAN / ED  COURSE  ----------------------------------------- 7:49 PM on 02/12/2020 -----------------------------------------  Patient goes to the bathroom.  He is not incontinent so I do not believe he has normal pressure hydrocephalus.   he makes it to the toilet but he is very unsteady and almost falls 3 times just walking the 8 feet to the toilet.  He does live by himself.  Additionally patient's renal function is worsening.  This could be due to dehydration.  His chest x-ray shows worsening airspace disease which could be due to pneumonia or worsening sarcoid that he reportedly has. I will get his ultrasound of his left leg and plan on admitting him.    ----------------------------------------- 9:12 PM on 02/12/2020 -----------------------------------------  Ultrasound does not show   any sign of DVT. Patient is very unsteady we will get him in the hospital.         ____________________________________________   FINAL CLINICAL IMPRESSION(S) / ED DIAGNOSES  Final diagnoses:  Weakness  Unable to walk     ED Discharge Orders    None       Note:  This document was prepared using Dragon voice recognition software and may include unintentional dictation errors.    Malinda, Paul F, MD 02/12/20 2022    Malinda, Paul F, MD 02/12/20 2112  

## 2020-02-12 NOTE — ED Notes (Signed)
[place] at bedside, pants and shoes off

## 2020-02-13 ENCOUNTER — Encounter: Payer: Self-pay | Admitting: Family Medicine

## 2020-02-13 DIAGNOSIS — Z794 Long term (current) use of insulin: Secondary | ICD-10-CM

## 2020-02-13 DIAGNOSIS — R296 Repeated falls: Principal | ICD-10-CM

## 2020-02-13 DIAGNOSIS — D696 Thrombocytopenia, unspecified: Secondary | ICD-10-CM

## 2020-02-13 DIAGNOSIS — E1140 Type 2 diabetes mellitus with diabetic neuropathy, unspecified: Secondary | ICD-10-CM

## 2020-02-13 DIAGNOSIS — N179 Acute kidney failure, unspecified: Secondary | ICD-10-CM

## 2020-02-13 LAB — CBC
HCT: 31.9 % — ABNORMAL LOW (ref 39.0–52.0)
Hemoglobin: 10.7 g/dL — ABNORMAL LOW (ref 13.0–17.0)
MCH: 27.7 pg (ref 26.0–34.0)
MCHC: 33.5 g/dL (ref 30.0–36.0)
MCV: 82.6 fL (ref 80.0–100.0)
Platelets: 80 10*3/uL — ABNORMAL LOW (ref 150–400)
RBC: 3.86 MIL/uL — ABNORMAL LOW (ref 4.22–5.81)
RDW: 16.9 % — ABNORMAL HIGH (ref 11.5–15.5)
WBC: 4.9 10*3/uL (ref 4.0–10.5)
nRBC: 0 % (ref 0.0–0.2)

## 2020-02-13 LAB — GLUCOSE, CAPILLARY
Glucose-Capillary: 229 mg/dL — ABNORMAL HIGH (ref 70–99)
Glucose-Capillary: 114 mg/dL — ABNORMAL HIGH (ref 70–99)
Glucose-Capillary: 142 mg/dL — ABNORMAL HIGH (ref 70–99)
Glucose-Capillary: 167 mg/dL — ABNORMAL HIGH (ref 70–99)

## 2020-02-13 LAB — BASIC METABOLIC PANEL
Anion gap: 10 (ref 5–15)
BUN: 35 mg/dL — ABNORMAL HIGH (ref 8–23)
CO2: 32 mmol/L (ref 22–32)
Calcium: 9.5 mg/dL (ref 8.9–10.3)
Chloride: 118 mmol/L — ABNORMAL HIGH (ref 98–111)
Creatinine, Ser: 1.67 mg/dL — ABNORMAL HIGH (ref 0.61–1.24)
GFR calc Af Amer: 44 mL/min — ABNORMAL LOW (ref >60)
GFR calc non Af Amer: 38 mL/min — ABNORMAL LOW (ref >60)
Glucose, Bld: 250 mg/dL — ABNORMAL HIGH (ref 70–99)
Potassium: 5.3 mmol/L — ABNORMAL HIGH (ref 3.5–5.1)
Sodium: 138 mmol/L (ref 135–145)

## 2020-02-13 LAB — HEMOGLOBIN A1C
Hgb A1c MFr Bld: 9.5 % — ABNORMAL HIGH (ref 4.8–5.6)
Mean Plasma Glucose: 225.95 mg/dL

## 2020-02-13 MED ORDER — TAMSULOSIN HCL 0.4 MG PO CAPS
0.4000 mg | ORAL_CAPSULE | Freq: Every day | ORAL | Status: DC
  Administered 2020-02-13 – 2020-02-15 (×4): 0.4 mg via ORAL
  Filled 2020-02-13 (×4): qty 1

## 2020-02-13 MED ORDER — SODIUM POLYSTYRENE SULFONATE 15 GM/60ML PO SUSP
15.0000 g | Freq: Once | ORAL | Status: AC
  Administered 2020-02-13: 15 g via ORAL
  Filled 2020-02-13: qty 60

## 2020-02-13 MED ORDER — ACETAMINOPHEN 650 MG RE SUPP: 650.0000 mg | Freq: Four times a day (QID) | RECTAL | Status: DC | PRN

## 2020-02-13 MED ORDER — LACTATED RINGERS IV SOLN: INTRAVENOUS | Status: AC

## 2020-02-13 MED ORDER — NITROGLYCERIN 0.4 MG SL SUBL: 0.4000 mg | SUBLINGUAL_TABLET | SUBLINGUAL | Status: DC | PRN

## 2020-02-13 MED ORDER — PANTOPRAZOLE SODIUM 40 MG PO TBEC
40.0000 mg | DELAYED_RELEASE_TABLET | Freq: Every day | ORAL | Status: DC
  Administered 2020-02-13 – 2020-02-16 (×4): 40 mg via ORAL
  Filled 2020-02-13 (×4): qty 1

## 2020-02-13 MED ORDER — ATORVASTATIN CALCIUM 20 MG PO TABS
80.0000 mg | ORAL_TABLET | Freq: Every day | ORAL | Status: DC
  Administered 2020-02-13 – 2020-02-15 (×3): 80 mg via ORAL
  Filled 2020-02-13 (×3): qty 4

## 2020-02-13 MED ORDER — ENOXAPARIN SODIUM 40 MG/0.4ML ~~LOC~~ SOLN
40.0000 mg | SUBCUTANEOUS | Status: DC
  Administered 2020-02-13 – 2020-02-16 (×4): 40 mg via SUBCUTANEOUS
  Filled 2020-02-13 (×4): qty 0.4

## 2020-02-13 MED ORDER — ISOSORBIDE MONONITRATE ER 30 MG PO TB24
120.0000 mg | ORAL_TABLET | Freq: Every day | ORAL | Status: DC
  Administered 2020-02-13 – 2020-02-16 (×4): 120 mg via ORAL
  Filled 2020-02-13: qty 4
  Filled 2020-02-13: qty 2
  Filled 2020-02-13 (×3): qty 4

## 2020-02-13 MED ORDER — INSULIN GLARGINE 100 UNIT/ML ~~LOC~~ SOLN
20.0000 [IU] | Freq: Every day | SUBCUTANEOUS | Status: DC
  Administered 2020-02-13 – 2020-02-15 (×3): 20 [IU] via SUBCUTANEOUS
  Filled 2020-02-13 (×6): qty 0.2

## 2020-02-13 MED ORDER — OXYCODONE HCL 5 MG PO TABS
5.0000 mg | ORAL_TABLET | ORAL | Status: DC | PRN
  Administered 2020-02-14: 5 mg via ORAL
  Filled 2020-02-13: qty 1

## 2020-02-13 MED ORDER — ONDANSETRON HCL 4 MG PO TABS: 4.0000 mg | ORAL_TABLET | Freq: Four times a day (QID) | ORAL | Status: DC | PRN

## 2020-02-13 MED ORDER — TRAZODONE HCL 50 MG PO TABS: 50.0000 mg | ORAL_TABLET | Freq: Every evening | ORAL | Status: DC | PRN

## 2020-02-13 MED ORDER — GABAPENTIN 100 MG PO CAPS
100.0000 mg | ORAL_CAPSULE | Freq: Two times a day (BID) | ORAL | Status: DC
  Administered 2020-02-13 – 2020-02-16 (×7): 100 mg via ORAL
  Filled 2020-02-13 (×8): qty 1

## 2020-02-13 MED ORDER — ACETAMINOPHEN 325 MG PO TABS
650.0000 mg | ORAL_TABLET | Freq: Four times a day (QID) | ORAL | Status: DC | PRN
  Administered 2020-02-13 – 2020-02-15 (×3): 650 mg via ORAL
  Filled 2020-02-13 (×3): qty 2

## 2020-02-13 MED ORDER — METOPROLOL SUCCINATE ER 50 MG PO TB24
150.0000 mg | ORAL_TABLET | Freq: Every day | ORAL | Status: DC
  Administered 2020-02-13 – 2020-02-16 (×4): 150 mg via ORAL
  Filled 2020-02-13 (×2): qty 1
  Filled 2020-02-13: qty 3
  Filled 2020-02-13: qty 1

## 2020-02-13 MED ORDER — INSULIN ASPART 100 UNIT/ML ~~LOC~~ SOLN
4.0000 [IU] | Freq: Three times a day (TID) | SUBCUTANEOUS | Status: DC
  Administered 2020-02-13 – 2020-02-16 (×8): 4 [IU] via SUBCUTANEOUS
  Filled 2020-02-13 (×8): qty 1

## 2020-02-13 MED ORDER — FUROSEMIDE 20 MG PO TABS
20.0000 mg | ORAL_TABLET | ORAL | Status: DC
  Administered 2020-02-13 – 2020-02-15 (×2): 20 mg via ORAL
  Filled 2020-02-13 (×3): qty 1

## 2020-02-13 MED ORDER — INSULIN ASPART 100 UNIT/ML ~~LOC~~ SOLN
0.0000 [IU] | Freq: Every day | SUBCUTANEOUS | Status: DC
  Administered 2020-02-15: 3 [IU] via SUBCUTANEOUS
  Filled 2020-02-13: qty 1

## 2020-02-13 MED ORDER — SODIUM CHLORIDE 0.9% FLUSH
3.0000 mL | Freq: Two times a day (BID) | INTRAVENOUS | Status: DC
  Administered 2020-02-13 – 2020-02-15 (×6): 3 mL via INTRAVENOUS

## 2020-02-13 MED ORDER — SERTRALINE HCL 50 MG PO TABS
200.0000 mg | ORAL_TABLET | Freq: Every day | ORAL | Status: DC
  Administered 2020-02-13 – 2020-02-16 (×4): 200 mg via ORAL
  Filled 2020-02-13 (×4): qty 4

## 2020-02-13 MED ORDER — ONDANSETRON HCL 4 MG/2ML IJ SOLN: 4.0000 mg | Freq: Four times a day (QID) | INTRAMUSCULAR | Status: DC | PRN

## 2020-02-13 MED ORDER — CLOPIDOGREL BISULFATE 75 MG PO TABS
75.0000 mg | ORAL_TABLET | Freq: Every day | ORAL | Status: DC
  Administered 2020-02-13 – 2020-02-16 (×4): 75 mg via ORAL
  Filled 2020-02-13 (×4): qty 1

## 2020-02-13 MED ORDER — INSULIN ASPART 100 UNIT/ML ~~LOC~~ SOLN
0.0000 [IU] | Freq: Three times a day (TID) | SUBCUTANEOUS | Status: DC
  Administered 2020-02-13: 2 [IU] via SUBCUTANEOUS
  Administered 2020-02-13: 5 [IU] via SUBCUTANEOUS
  Administered 2020-02-14: 2 [IU] via SUBCUTANEOUS
  Administered 2020-02-14: 3 [IU] via SUBCUTANEOUS
  Administered 2020-02-14 – 2020-02-15 (×2): 2 [IU] via SUBCUTANEOUS
  Administered 2020-02-15: 5 [IU] via SUBCUTANEOUS
  Administered 2020-02-16: 3 [IU] via SUBCUTANEOUS
  Filled 2020-02-13 (×8): qty 1

## 2020-02-13 MED ORDER — POLYETHYLENE GLYCOL 3350 17 G PO PACK: 17.0000 g | PACK | Freq: Every day | ORAL | Status: DC | PRN

## 2020-02-13 NOTE — Evaluation (Signed)
Physical Therapy Evaluation Patient Details Name: Peter Robertson MRN: [id number] DOB: [birth year] Today's Date: 02/13/2020   History of Present Illness   79 y.o. male with history of type 2 diabetes, thrombocytopenia, BPPV, hypertension, CAD status post CABG, HFpEF, sarcoidosis, TIA, splenomegaly, nephrolithiasis, who presents with weakness and frequent falls.  Clinical Impression  Pt has a history of many falls, however this seems to be related much more to his cognition than actual physical ability.  Today he easily and confidently walker ~300 ft with walker and likely could have done much more than that w/o issue.  However, it appears he forgets to use his walker or generally has issues secondary to cognition and even though neighbors check-in daily he is having a harder and harder time managing in the home.  Pt does not really seem to have much overt rehab-type needs but is clearly unsafe in the home by himself and needs some form of increased supervision or more realistically seems to need ALF, though this is not necessarily an acute PT assessment type of decision.     Follow Up Recommendations Supervision/Assistance - 24 hour;Home health PT    Equipment Recommendations  None recommended by PT    Recommendations for Other Services       Precautions / Restrictions Precautions Precautions: Fall Restrictions Weight Bearing Restrictions: No      Mobility  Bed Mobility Overal bed mobility: Independent             General bed mobility comments: pt easily gets to EOB  Transfers Overall transfer level: Independent Equipment used: Rolling walker (2 wheeled)             General transfer comment: needed only very minimal cuing, able to rise with walker easily w/o assist  Ambulation/Gait Ambulation/Gait assistance: Modified independent (Device/Increase time) Gait Distance (Feet): 300 Feet Assistive device: Rolling walker (2 wheeled)       General Gait Details: Pt was  able to confidently and casually ambulate around the ER, sats in mid/high 90s on room air, HR stable 70-80s.  Did not appear overly reliant on the walker with no LOBs.   Stairs            Wheelchair Mobility    Modified Rankin (Stroke Patients Only)       Balance Overall balance assessment: Modified Independent (no LOBs with prolonged ambulation using RW)                                           Pertinent Vitals/Pain      Home Living Family/patient expects to be discharged to:: Unsure                 Additional Comments: Pt feels that he can go home, states he will pay someone to stay with him?      Prior Function Level of Independence: Independent with assistive device(s)         Comments: Apparently he does much of his ADLs but neighbor (POA) and her family stop in QD.  Additionally pt reports he has had "too many falls to keep track of" but is relatively safe when he remembers to use his walker...     Hand Dominance   Dominant Hand: Right    Extremity/Trunk Assessment   Upper Extremity Assessment Upper Extremity Assessment: Overall WFL for tasks assessed    Lower Extremity Assessment Lower  Extremity Assessment: Overall WFL for tasks assessed       Communication   Communication: No difficulties  Cognition Arousal/Alertness: Awake/alert (guessed Aug 1981 for date, unaware of situation, location, ) Behavior During Therapy: WFL for tasks assessed/performed Overall Cognitive Status: History of cognitive impairments - at baseline (unsure of exact baseline but appears congruent to documented)                                        General Comments      Exercises     Assessment/Plan    PT Assessment Patient needs continued PT services  PT Problem List Decreased strength;Decreased range of motion;Decreased activity tolerance;Decreased balance;Decreased mobility;Decreased coordination;Decreased knowledge of use  of DME;Decreased safety awareness;Decreased cognition       PT Treatment Interventions Gait training;Neuromuscular re-education;Balance training;Therapeutic activities;Therapeutic exercise;Functional mobility training    PT Goals (Current goals can be found in the Care Plan section)  Acute Rehab PT Goals Patient Stated Goal: go home, stop falling PT Goal Formulation: With patient Time For Goal Achievement: 02/27/20 Potential to Achieve Goals: Fair    Frequency Min 2X/week   Barriers to discharge        Co-evaluation               AM-PAC PT "6 Clicks" Mobility  Outcome Measure Help needed turning from your back to your side while in a flat bed without using bedrails?: None Help needed moving from lying on your back to sitting on the side of a flat bed without using bedrails?: None Help needed moving to and from a bed to a chair (including a wheelchair)?: None Help needed standing up from a chair using your arms (e.g., wheelchair or bedside chair)?: None Help needed to walk in hospital room?: None Help needed climbing 3-5 steps with a railing? : None 6 Click Score: 24    End of Session Equipment Utilized During Treatment: Gait belt Activity Tolerance: Patient tolerated treatment well Patient left: with call bell/phone within reach;in bed Nurse Communication: Mobility status PT Visit Diagnosis: Repeated falls (R29.6);History of falling (Z91.81)    Time: 2334-3568 PT Time Calculation (min) (ACUTE ONLY): 26 min   Charges:   PT Evaluation $PT Eval Low Complexity: 1 Low          Kreg Shropshire, DPT 02/13/2020, 1:12 PM

## 2020-02-13 NOTE — Progress Notes (Signed)
PROGRESS NOTE    Peter Robertson  JQB:[id number] DOB: 1941-01-25 DOA: 02/12/2020 PCP: Danae Orleans, MD   Chief complaint.  Fall.  Brief Narrative:  Peter Robertson is a 79 y.o. male with history of type 2 diabetes, thrombocytopenia, BPPV, hypertension, CAD status post CABG, HFpEF, sarcoidosis, TIA, splenomegaly, nephrolithiasis, who presents with weakness and frequent falls.  Patient also has dizziness and lightheadedness. Currently, is complaining left ankle pain from the fall.  X-ray showed soft tissue swelling without fracture.  No DVT. He was also found to have acute kidney injury with mild hyperkalemia today.  Receiving IV fluids.  Assessment & Plan:   Active Problems:   CAD (coronary artery disease)   Diabetes mellitus ([place])   Thrombocytopenia ([place])   AKI (acute kidney injury) ([place])  #1.  Frequent falls with deconditioning. Continue physical therapy/Occupational Therapy.  Discussed with social worker, most likely will discharge home tomorrow with home care, physical therapy.  2.  Acute kidney injury with mild hyperkalemia. Reviewed patient previous labs, patient GFR was more than 60 previously.  I'll continue IV fluids.  Give a dose of Kayexalate.  Also obtain bladder scan to rule out urinary retention.  3.  Left ankle sprain.   X-ray did not show any fracture.  Continue symptomatic treatment.  4.  Chronic thrombocytopenia. Follow.  5.  Type 2 diabetes. Continue current regimen.  6.  Essential hypertension. Continue home medicines.  Check orthostatic vital signs for orthostatic hypotension.    DVT prophylaxis:Lovenox Code Status: Full Family Communication: None Disposition Plan:  . Patient came from: Home           . Anticipated d/c place: Home . Barriers to d/c OR conditions which need to be met to effect a safe d/c: We'll discharge home tomorrow when renal function is better.  Consultants:   None  Procedures:  None Antimicrobials:None  Subjective: Patient complains of left ankle pain from the fall.  With some swelling.  Dizziness when he stands.  Denies any short of breath or cough. No fever chills.  Objective: Vitals:   02/13/20 0734 02/13/20 0933 02/13/20 0935 02/13/20 1030  BP: (!) 163/82  (!) 143/88 118/68  Pulse: 70  74 66  Resp: 16   20  Temp:      TempSrc:      SpO2: 99%   97%  Weight:  81.2 kg    Height:        Intake/Output Summary (Last 24 hours) at 02/13/2020 1413 Last data filed at 02/12/2020 2100 Gross per 24 hour  Intake 1132.2 ml  Output 200 ml  Net 932.2 ml   Filed Weights   02/12/20 1622 02/13/20 0933  Weight: 81.8 kg 81.2 kg    Examination:  General exam: Appears calm and comfortable  Respiratory system: Clear to auscultation. Respiratory effort normal. Cardiovascular system: S1 & S2 heard, RRR. No JVD, murmurs, rubs, gallops or clicks. No pedal edema. Gastrointestinal system: Abdomen is nondistended, soft and nontender. No organomegaly or masses felt. Normal bowel sounds heard. Central nervous system: Alert and oriented x3. No focal neurological deficits. Extremities: Left ankle swelling. Skin: No rashes, lesions or ulcers Psychiatry:  Mood & affect appropriate.     Data Reviewed: I have personally reviewed following labs and imaging studies  CBC: Recent Labs  Lab 02/12/20 1624 02/13/20 0737  WBC 5.7 4.9  HGB 10.7* 10.7*  HCT 30.4* 31.9*  MCV 80.6 82.6  PLT 87* 80*   Basic Metabolic Panel: Recent Labs  Lab  02/12/20 1624 02/13/20 0737  NA 137 138  K 4.7 5.3*  CL 105 118*  CO2 22 32  GLUCOSE 335* 250*  BUN 40* 35*  CREATININE 1.74* 1.67*  CALCIUM 8.2* 9.5   GFR: Estimated Creatinine Clearance: 35.3 mL/min (A) (by C-G formula based on SCr of 1.67 mg/dL (H)). Liver Function Tests: Recent Labs  Lab 02/12/20 1630  AST 16  ALT 14  ALKPHOS 68  BILITOT 0.8  PROT 5.7*  ALBUMIN 3.4*   No results for input(s): LIPASE, AMYLASE in the  last 168 hours. No results for input(s): AMMONIA in the last 168 hours. Coagulation Profile: No results for input(s): INR, PROTIME in the last 168 hours. Cardiac Enzymes: No results for input(s): CKTOTAL, CKMB, CKMBINDEX, TROPONINI in the last 168 hours. BNP (last 3 results) No results for input(s): PROBNP in the last 8760 hours. HbA1C: Recent Labs    02/12/20 1630  HGBA1C 9.5*   CBG: Recent Labs  Lab 02/13/20 0939 02/13/20 1324  GLUCAP 229* 114*   Lipid Profile: No results for input(s): CHOL, HDL, LDLCALC, TRIG, CHOLHDL, LDLDIRECT in the last 72 hours. Thyroid Function Tests: No results for input(s): TSH, T4TOTAL, FREET4, T3FREE, THYROIDAB in the last 72 hours. Anemia Panel: No results for input(s): VITAMINB12, FOLATE, FERRITIN, TIBC, IRON, RETICCTPCT in the last 72 hours. Sepsis Labs: No results for input(s): PROCALCITON, LATICACIDVEN in the last 168 hours.  Recent Results (from the past 240 hour(s))  SARS Coronavirus 2 by RT PCR (hospital order, performed in [facility] hospital lab) Nasopharyngeal Nasopharyngeal Swab     Status: None   Collection Time: 02/12/20 10:31 PM   Specimen: Nasopharyngeal Swab  Result Value Ref Range Status   SARS Coronavirus 2 NEGATIVE NEGATIVE Final    Comment: (NOTE) SARS-CoV-2 target nucleic acids are NOT DETECTED.  The SARS-CoV-2 RNA is generally detectable in upper and lower respiratory specimens during the acute phase of infection. The lowest concentration of SARS-CoV-2 viral copies this assay can detect is 250 copies / mL. A negative result does not preclude SARS-CoV-2 infection and should not be used as the sole basis for treatment or other patient management decisions.  A negative result may occur with improper specimen collection / handling, submission of specimen other than nasopharyngeal swab, presence of viral mutation(s) within the areas targeted by this assay, and inadequate number of viral copies (<250 copies / mL). A  negative result must be combined with clinical observations, patient history, and epidemiological information.  Fact Sheet for Patients:   StrictlyIdeas.no  Fact Sheet for Healthcare Providers: BankingDealers.co.za  This test is not yet approved or  cleared by the [place] FDA and has been authorized for detection and/or diagnosis of SARS-CoV-2 by FDA under an Emergency Use Authorization (EUA).  This EUA will remain in effect (meaning this test can be used) for the duration of the COVID-19 declaration under Section 564(b)(1) of the Act, 21 U.S.C. section 360bbb-3(b)(1), unless the authorization is terminated or revoked sooner.  Performed at [facility], [address], [place], [place] [address]          Radiology Studies: DG Ankle Complete Left  Result Date: 02/12/2020 CLINICAL DATA:  Initial evaluation for acute trauma, fall, pain. EXAM: LEFT ANKLE COMPLETE - 3+ VIEW COMPARISON:  None available. FINDINGS: No acute fracture dislocation. Ankle mortise approximated. Talar dome intact. Posterior plantar calcaneal enthesophytes noted. Mild osteopenia. Diffuse soft tissue swelling present about the ankle. IMPRESSION: 1. No acute fracture or dislocation. 2. Diffuse soft tissue  swelling about the ankle. Electronically Signed   By: Jeannine Boga M.D.   On: 02/12/2020 19:59   CT Head Wo Contrast  Result Date: 02/12/2020 CLINICAL DATA:  Headaches and left-sided shoulder pain, history of recent falls, initial encounter EXAM: CT HEAD WITHOUT CONTRAST CT CERVICAL SPINE WITHOUT CONTRAST TECHNIQUE: Multidetector CT imaging of the head and cervical spine was performed following the standard protocol without intravenous contrast. Multiplanar CT image reconstructions of the cervical spine were also generated. COMPARISON:  04/07/2019 FINDINGS: CT HEAD FINDINGS Brain: No evidence of acute infarction, hemorrhage, hydrocephalus,  extra-axial collection or mass lesion/mass effect. Mild atrophic and chronic white matter ischemic changes are noted. Vascular: No hyperdense vessel or unexpected calcification. Skull: Normal. Negative for fracture or focal lesion. Sinuses/Orbits: No acute finding. Other: None. CT CERVICAL SPINE FINDINGS Alignment: Mild straightening of the normal cervical lordosis is noted. Skull base and vertebrae: Mild degenerative anterolisthesis of C4 on C5 is noted. Disc space narrowing is noted at C4-5, C5-6 and C6-7. Associated osteophytic changes are noted at these levels as well. Mild facet hypertrophic changes are noted without acute abnormality. No acute fracture or acute facet abnormality is seen. Soft tissues and spinal canal: There is a hypodensity identified within the right lobe of the thyroid measuring 17 mm. This is stable in appearance from the prior exam. Upper chest: Scarring is noted in the upper lung fields bilaterally. This is stable from the prior study. Other: None IMPRESSION: CT of the head: Chronic atrophic and ischemic changes without acute abnormality. CT of the cervical spine: Degenerative change without acute abnormality. Right thyroid nodule measuring 17 mm. No follow-up recommended unless clinically warranted (ref: J Am Coll Radiol. 2015 Feb;12(2): 143-50). Electronically Signed   By: Inez Catalina M.D.   On: 02/12/2020 19:06   CT Cervical Spine Wo Contrast  Result Date: 02/12/2020 CLINICAL DATA:  Headaches and left-sided shoulder pain, history of recent falls, initial encounter EXAM: CT HEAD WITHOUT CONTRAST CT CERVICAL SPINE WITHOUT CONTRAST TECHNIQUE: Multidetector CT imaging of the head and cervical spine was performed following the standard protocol without intravenous contrast. Multiplanar CT image reconstructions of the cervical spine were also generated. COMPARISON:  04/07/2019 FINDINGS: CT HEAD FINDINGS Brain: No evidence of acute infarction, hemorrhage, hydrocephalus, extra-axial  collection or mass lesion/mass effect. Mild atrophic and chronic white matter ischemic changes are noted. Vascular: No hyperdense vessel or unexpected calcification. Skull: Normal. Negative for fracture or focal lesion. Sinuses/Orbits: No acute finding. Other: None. CT CERVICAL SPINE FINDINGS Alignment: Mild straightening of the normal cervical lordosis is noted. Skull base and vertebrae: Mild degenerative anterolisthesis of C4 on C5 is noted. Disc space narrowing is noted at C4-5, C5-6 and C6-7. Associated osteophytic changes are noted at these levels as well. Mild facet hypertrophic changes are noted without acute abnormality. No acute fracture or acute facet abnormality is seen. Soft tissues and spinal canal: There is a hypodensity identified within the right lobe of the thyroid measuring 17 mm. This is stable in appearance from the prior exam. Upper chest: Scarring is noted in the upper lung fields bilaterally. This is stable from the prior study. Other: None IMPRESSION: CT of the head: Chronic atrophic and ischemic changes without acute abnormality. CT of the cervical spine: Degenerative change without acute abnormality. Right thyroid nodule measuring 17 mm. No follow-up recommended unless clinically warranted (ref: J Am Coll Radiol. 2015 Feb;12(2): 143-50). Electronically Signed   By: Inez Catalina M.D.   On: 02/12/2020 19:06   US Venous Img  Lower Unilateral Left  Result Date: 02/12/2020 CLINICAL DATA:  Pain and swelling for 1 year. EXAM: LEFT LOWER EXTREMITY VENOUS DOPPLER ULTRASOUND TECHNIQUE: Gray-scale sonography with compression, as well as color and duplex ultrasound, were performed to evaluate the deep venous system(s) from the level of the common femoral vein through the popliteal and proximal calf veins. COMPARISON:  Venous ultrasound 02/07/2016 FINDINGS: VENOUS Normal compressibility of the common femoral, superficial femoral, and popliteal veins, as well as the visualized calf veins. Visualized  portions of profunda femoral vein and great saphenous vein unremarkable. No filling defects to suggest DVT on grayscale or color Doppler imaging. Doppler waveforms show normal direction of venous flow, normal respiratory plasticity and response to augmentation. Limited views of the contralateral common femoral vein are unremarkable. OTHER None. Limitations: None IMPRESSION: No evidence of left lower extremity DVT. Electronically Signed   By: Keith Rake M.D.   On: 02/12/2020 20:25   DG Chest Portable 1 View  Result Date: 02/12/2020 CLINICAL DATA:  Weakness, frequent fall, history of sarcoid EXAM: PORTABLE CHEST 1 VIEW COMPARISON:  05/20/2018, 01/30/2017 CT chest 08/27/2019 FINDINGS: Post sternotomy changes. Diffusely abnormal appearance of the lung parenchyma with calcified and noncalcified lung nodules. Irregular foci of opacity bilaterally, increased radiographically since 2019. Stable cardiomediastinal silhouette. No pneumothorax. IMPRESSION: Diffusely abnormal appearance of the lung parenchyma with calcified and noncalcified lung nodules. Overall increased bilateral irregular foci of airspace disease, either reflecting acute superimposed infectious process including viral pneumonia versus disease progression. Electronically Signed   By: Donavan Foil M.D.   On: 02/12/2020 18:42   DG Shoulder Left  Result Date: 02/12/2020 CLINICAL DATA:  Initially a shin for acute pain status post recent trauma, fall. EXAM: LEFT SHOULDER - 2+ VIEW COMPARISON:  Prior radiograph from 08/27/2019. FINDINGS: No acute fracture dislocation. Humeral head in normal alignment with the glenoid. AC joint approximated. Mild osteoarthritic changes present about the shoulder. No periarticular calcification. No visible soft tissue injury. Mild osteopenia. IMPRESSION: No acute osseous abnormality about the left shoulder. Electronically Signed   By: Jeannine Boga M.D.   On: 02/12/2020 20:01   CT Renal Stone Study  Result  Date: 02/12/2020 CLINICAL DATA:  Flank pain. Kidney stone suspected. History of stones. Patient reports weakness and frequent falls. Left-sided abdominal pain. EXAM: CT ABDOMEN AND PELVIS WITHOUT CONTRAST TECHNIQUE: Multidetector CT imaging of the abdomen and pelvis was performed following the standard protocol without IV contrast. COMPARISON:  CT 1 month ago 01/17/2020, additional priors FINDINGS: Lower chest: Stable pleural and diaphragmatic calcifications and calcified nodules. Calcified lower mediastinal and paraesophageal nodes are unchanged. No acute airspace disease. No pleural fluid. Heart is normal in size. There are coronary artery calcifications. Hepatobiliary: No focal hepatic abnormality on noncontrast exam. Equivocal capsular nodularity. Gallbladder physiologically distended, no calcified stone. No biliary dilatation. Pancreas: No ductal dilatation or inflammation. Stable coarse calcifications in the pancreatic head. Spleen: Chronic splenomegaly measuring up to 15.7 cm AP. No focal abnormality. Adrenals/Urinary Tract: Stable adrenal thickening without dominant nodule. There is chronic dilatation of the left renal pelvis. No evidence of ureteral calculi. 8 mm nonobstructing stone in the lower left kidney. There is no right hydronephrosis or stone. Cyst in the right kidney is not as well visualized in the absence of IV contrast. Mild chronic bladder wall thickening with left anterior bladder diverticulum unchanged. Stomach/Bowel: Patulous distal esophagus. Nondistended stomach. Normal positioning of the duodenum and ligament of Treitz. There is no small bowel obstruction or inflammatory change. Normal appendix. Small volume of  stool in the proximal colon. The distal colon is decompressed. Occasional descending and sigmoid colonic diverticula without diverticulitis. Mild sigmoid colonic tortuosity. Vascular/Lymphatic: Aortic atherosclerosis. No aortic aneurysm. There are small retroperitoneal nodes that  are better assessed on prior contrast-enhanced exam. No evidence of bulky adenopathy. 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 Other: No ascites.  No free air.  Fat in both inguinal canals. Musculoskeletal: Chronic L1 compression fracture. This is unchanged from prior exams. Remote posterior left 10 rib fracture. There are no acute or suspicious osseous abnormalities. IMPRESSION: 1. Nonobstructing 8 mm stone in the lower left kidney. Chronic prominence of the left renal pelvis without evidence of obstructing stone or ureteral calculus. 2. Chronic bladder wall thickening and left anterior bladder diverticulum. Enlarged prostate gland causing mass effect on the bladder base. 3. Equivocal capsular nodularity of the liver, can be seen with cirrhosis. Chronic splenomegaly. 4. Colonic diverticulosis without diverticulitis. Aortic Atherosclerosis (ICD10-I70.0). Electronically Signed   By: Keith Rake M.D.   On: 02/12/2020 19:14        Scheduled Meds: . atorvastatin  80 mg Oral QHS  . clopidogrel  75 mg Oral Daily  . enoxaparin (LOVENOX) injection  40 mg Subcutaneous Q24H  . furosemide  20 mg Oral QODAY  . gabapentin  100 mg Oral BID  . insulin aspart  0-15 Units Subcutaneous TID WC  . insulin aspart  0-5 Units Subcutaneous QHS  . insulin aspart  4 Units Subcutaneous TID WC  . insulin glargine  20 Units Subcutaneous QHS  . isosorbide mononitrate  120 mg Oral Daily  . metoprolol succinate  150 mg Oral Daily  . pantoprazole  40 mg Oral Daily  . sertraline  200 mg Oral Daily  . sodium chloride flush  3 mL Intravenous Q12H  . sodium polystyrene  15 g Oral Once  . tamsulosin  0.4 mg Oral QHS   Continuous Infusions:   LOS: 1 day    Time spent: 26 minutes    Sharen Hones, MD [facility]   To contact the attending provider between 7A-7P or the covering provider during after hours 7P-7A, please log into the web site [email or web address]  and access using universal [place] password for that web site. If you do not have the password, please call the hospital operator.  02/13/2020, 2:13 PM

## 2020-02-13 NOTE — Progress Notes (Signed)
OT Cancellation Note  Patient Details Name: Peter Robertson MRN: [id number] DOB: [birth year]   Cancelled Treatment:    Reason Eval/Treat Not Completed: Patient not medically ready. OT order received and chart reviewed. Per chart, pt noted to have elevated K+ (5.3) this AM and platelets continue to be <100. Will hold services at this time and initiate at later time/date as available.   Dessie Coma, M.S. OTR/L  02/13/20, 11:00 AM  ascom [phone]

## 2020-02-13 NOTE — TOC Initial Note (Signed)
Transition of Care [facility]) - Initial/Assessment Note    Patient Details  Name: Peter Robertson MRN: [id number] Date of Birth: Jul 14, 1940  [facility]) CM/SW Contact:    Ova Freshwater Phone Number: [phone] 02/13/2020, 3:31 PM  Clinical Narrative:                  Patient presents to [facility] ED presents due to fall and weakness.  TOC spoke with the patient, oriented X3, has help from Peter Robertson [employer]) [phone], with groceries, doctor's appointment and groceries, patient is able to perform ADLs and is not incontinent.  Patient stated Peter Robertson, helps him with organizing his medications and insulin and he takes them on his own.  Patient is calm and pelasant and is mainly worried about the swelling in his lower legs.  This CSW contacted and left voicemail for Peter Robertson. Patient has a brother Peter Robertson but is not invilved in his day-to-day care.        Patient Goals and CMS Choice        Expected Discharge Plan and Services                                                Prior Living Arrangements/Services                       Activities of Daily Living Home Assistive Devices/Equipment: Bathtub lift, Bedside commode/3-in-1, Walker (specify type) ADL Screening (condition at time of admission) Patient's cognitive ability adequate to safely complete daily activities?: Yes Is the patient deaf or have difficulty hearing?: No Does the patient have difficulty seeing, even when wearing glasses/contacts?: No Does the patient have difficulty concentrating, remembering, or making decisions?: No Patient able to express need for assistance with ADLs?: Yes Does the patient have difficulty dressing or bathing?: No Independently performs ADLs?: Yes (appropriate for developmental age) Does the patient have difficulty walking or climbing stairs?: Yes Weakness of Legs: Both Weakness of Arms/Hands: Both  Permission Sought/Granted                   Emotional Assessment              Admission diagnosis:  Weakness [R53.1] Fall [W19.XXXA] Unable to walk [R26.2] AKI (acute kidney injury) [facility]) [N17.9] Patient Active Problem List   Diagnosis Date Noted  . AKI (acute kidney injury) ([place]) 02/12/2020  . Vertigo 10/11/2019  . BPPV (benign paroxysmal positional vertigo) 10/10/2019  . CAD (coronary artery disease) 10/10/2019  . Diabetes mellitus ([place]) 10/10/2019  . BPH (benign prostatic hyperplasia) 10/10/2019  . Thrombocytopenia ([place]) 10/10/2019  . Dark stools 10/10/2019  . Unstable angina ([place]) 05/20/2018  . Chest pain 01/30/2017   PCP:  Danae Orleans, MD Pharmacy:   [id number] [place], [place] [address] [place] [address] Phone: [phone] Fax: [phone]     Social Determinants of Health (SDOH) Interventions    Readmission Risk Interventions No flowsheet data found.

## 2020-02-14 LAB — CBC WITH DIFFERENTIAL/PLATELET
Abs Immature Granulocytes: 0.02 10*3/uL (ref 0.00–0.07)
Basophils Absolute: 0 10*3/uL (ref 0.0–0.1)
Basophils Relative: 0 %
Eosinophils Absolute: 0.1 10*3/uL (ref 0.0–0.5)
Eosinophils Relative: 2 %
HCT: 32.2 % — ABNORMAL LOW (ref 39.0–52.0)
Hemoglobin: 11.3 g/dL — ABNORMAL LOW (ref 13.0–17.0)
Immature Granulocytes: 0 %
Lymphocytes Relative: 14 %
Lymphs Abs: 0.8 10*3/uL (ref 0.7–4.0)
MCH: 27.9 pg (ref 26.0–34.0)
MCHC: 35.1 g/dL (ref 30.0–36.0)
MCV: 79.5 fL — ABNORMAL LOW (ref 80.0–100.0)
Monocytes Absolute: 0.5 10*3/uL (ref 0.1–1.0)
Monocytes Relative: 10 %
Neutro Abs: 3.8 10*3/uL (ref 1.7–7.7)
Neutrophils Relative %: 74 %
Platelets: 82 10*3/uL — ABNORMAL LOW (ref 150–400)
RBC: 4.05 MIL/uL — ABNORMAL LOW (ref 4.22–5.81)
RDW: 16.7 % — ABNORMAL HIGH (ref 11.5–15.5)
WBC: 5.3 10*3/uL (ref 4.0–10.5)
nRBC: 0 % (ref 0.0–0.2)

## 2020-02-14 LAB — BASIC METABOLIC PANEL
Anion gap: 5 (ref 5–15)
BUN: 25 mg/dL — ABNORMAL HIGH (ref 8–23)
CO2: 27 mmol/L (ref 22–32)
Calcium: 8.7 mg/dL — ABNORMAL LOW (ref 8.9–10.3)
Chloride: 107 mmol/L (ref 98–111)
Creatinine, Ser: 1.45 mg/dL — ABNORMAL HIGH (ref 0.61–1.24)
GFR calc Af Amer: 52 mL/min — ABNORMAL LOW (ref >60)
GFR calc non Af Amer: 45 mL/min — ABNORMAL LOW (ref >60)
Glucose, Bld: 170 mg/dL — ABNORMAL HIGH (ref 70–99)
Potassium: 3.9 mmol/L (ref 3.5–5.1)
Sodium: 139 mmol/L (ref 135–145)

## 2020-02-14 LAB — GLUCOSE, CAPILLARY
Glucose-Capillary: 154 mg/dL — ABNORMAL HIGH (ref 70–99)
Glucose-Capillary: 123 mg/dL — ABNORMAL HIGH (ref 70–99)
Glucose-Capillary: 134 mg/dL — ABNORMAL HIGH (ref 70–99)
Glucose-Capillary: 135 mg/dL — ABNORMAL HIGH (ref 70–99)

## 2020-02-14 NOTE — Evaluation (Signed)
Occupational Therapy Evaluation Patient Details Name: Peter Robertson MRN: [id number] DOB: [birth year] Today's Date: 02/14/2020    History of Present Illness  79 y.o. male with history of type 2 diabetes, thrombocytopenia, BPPV, hypertension, CAD status post CABG, HFpEF, sarcoidosis, TIA, splenomegaly, nephrolithiasis, who presents with weakness and frequent falls.   Clinical Impression   Pt is a 79 yo male who lives alone, presents with weakness and frequent falls.  Pt reports he has a friend who helps with medication management, shopping and taking him to MD appts.  Patient estimates 8 falls in the last month, one with injury to his left shoulder with residual pain.  Patient admits he forgets to use the walker at times.  Pt seen for OT evaluation and presents with muscle weakness, decreased transfers and mobility, pain in left shoulder which limits bed mobility and function, and decreased ability to perform self care and IADL tasks.  He complains of dizziness with sitting and standing.  Patient would benefit from skilled OT services to maximize his safety and independence with daily tasks.  Given his fall history, current dizziness he would benefit from 24 hour supervision for safety.  Recommend home health OT if he remains adamant on returning home.     Follow Up Recommendations  Home health OT , recommend 24 hour supervision for safety   Equipment Recommendations       Recommendations for Other Services       Precautions / Restrictions Precautions Precautions: Fall Precaution Comments: patient with multiple falls in the last month, estimates about 8?  feels dizziness with standing Restrictions Weight Bearing Restrictions: No      Mobility Bed Mobility Overal bed mobility: Needs Assistance Bed Mobility: Sit to Supine       Sit to supine: Min assist   General bed mobility comments: sit to supine, min assist with LE and to push up in bed  Transfers Overall transfer level: Needs  assistance Equipment used: Rolling walker (2 wheeled) Transfers: Sit to/from Stand Sit to Stand: Min guard              Balance Overall balance assessment: History of Falls                                         ADL either performed or assessed with clinical judgement   ADL Overall ADL's : Needs assistance/impaired Eating/Feeding: Modified independent   Grooming: Modified independent;Sitting Grooming Details (indicate cue type and reason): If performed in standing, patient requires min guard for safety due to dizziness Upper Body Bathing: Set up;Sitting   Lower Body Bathing: Set up   Upper Body Dressing : Modified independent   Lower Body Dressing: Min guard   Toilet Transfer: Min guard   Toileting- Clothing Manipulation and Hygiene: Min guard       Functional mobility during ADLs: Min guard General ADL Comments: Nursing assistant in to check orthostatic vitals, pt with dizziness with sitting and standing.  Min guard in standing, assist with bed mobility to move up in bed, pain in left arm limits his ability.     Vision Baseline Vision/History: No visual deficits       Perception     Praxis      Pertinent Vitals/Pain Pain Assessment: 0-10 Pain Score: 6  Pain Location: left shoulder pain, reports it is from a past fall out of his backdoor and onto concrete,  limits his bed mobility Pain Descriptors / Indicators: Aching;Sore Pain Intervention(s): Monitored during session;Repositioned     Hand Dominance Right   Extremity/Trunk Assessment Upper Extremity Assessment Upper Extremity Assessment: Generalized weakness;RUE deficits/detail;LUE deficits/detail RUE Deficits / Details: RUE ROM WFLS, shoulder flexion to 125 degrees, strength 4/5 LUE Deficits / Details: LUE shoulder flexion to 110, pain with movement, strength 3-/5   Lower Extremity Assessment Lower Extremity Assessment: Defer to PT evaluation       Communication  Communication Communication: No difficulties   Cognition Arousal/Alertness: Awake/alert Behavior During Therapy: WFL for tasks assessed/performed Overall Cognitive Status: History of cognitive impairments - at baseline                                 General Comments: Pt has difficulty with remembering to use walker at home   General Comments  dizziness and room spinning with sitting and standing, unchanged after 3-5 mins standing.    Exercises     Shoulder Instructions      Home Living Family/patient expects to be discharged to:: Private residence Living Arrangements: Alone Available Help at Discharge: Neighbor;Available PRN/intermittently Type of Home: House Home Access: Stairs to enter [employer] of Steps: 3 Entrance Stairs-Rails: Right Home Layout: One level     Bathroom Shower/Tub: Tub/shower unit;Curtain   Biochemist, clinical: Standard     Home Equipment: Environmental consultant - 2 wheels;Cane - single point;Bedside commode;Grab bars - tub/shower;Other (comment)   Additional Comments: Patient is adament about returning home despite history of falls, with and without injuries.      Prior Functioning/Environment Level of Independence: Independent with assistive device(s)        Comments: Pt has a friend/neighbor who assists with select tasks at home, he has difficulty with remembering to use the walker consistently        OT Problem List: Decreased strength;Decreased activity tolerance;Decreased cognition;Decreased knowledge of use of DME or AE;Impaired UE functional use;Decreased range of motion;Impaired balance (sitting and/or standing);Pain      OT Treatment/Interventions: Self-care/ADL training;Cognitive remediation/compensation;Balance training;Therapeutic exercise;DME and/or AE instruction;Therapeutic activities;Patient/family education    OT Goals(Current goals can be found in the care plan section) Acute Rehab OT Goals Patient Stated Goal:  patient would like to return home, be independent and reduce fall risk OT Goal Formulation: With patient Time For Goal Achievement: 02/28/20 Potential to Achieve Goals: Fair ADL Goals Pt Will Perform Lower Body Dressing: with modified independence Pt Will Transfer to Toilet: with modified independence  OT Frequency: Min 1X/week   Barriers to D/C:            Co-evaluation              AM-PAC OT "6 Clicks" Daily Activity     Outcome Measure Help from another person eating meals?: None Help from another person taking care of personal grooming?: A Little Help from another person toileting, which includes using toliet, bedpan, or urinal?: A Little Help from another person bathing (including washing, rinsing, drying)?: A Little Help from another person to put on and taking off regular upper body clothing?: None Help from another person to put on and taking off regular lower body clothing?: A Little 6 Click Score: 20   End of Session Equipment Utilized During Treatment: Gait belt;Rolling walker  Activity Tolerance: Patient tolerated treatment well Patient left: in bed;with call bell/phone within reach;with bed alarm set  OT Visit Diagnosis: Unsteadiness on feet (R26.81);Repeated falls (R29.6);Muscle  weakness (generalized) (M62.81);History of falling (Z91.81);Dizziness and giddiness (R42)                Time: 1216-2446 OT Time Calculation (min): 33 min Charges:  OT General Charges $OT Visit: 1 Visit OT Evaluation $OT Eval Low Complexity: 1 Low OT Treatments $Self Care/Home Management : 8-22 mins  Kyliee Ortego T Joson Sapp, OTR/L, CLT   Johnavon Mcclafferty 02/14/2020, 11:42 AM

## 2020-02-14 NOTE — Progress Notes (Signed)
PROGRESS NOTE    Peter Robertson  SWF:[id number] DOB: March 18, 1941 DOA: 02/12/2020 PCP: Danae Orleans, MD   Chief complaint.  Generalized weakness. Brief Narrative: Peter Armijo Blalockis a 79 y.o.malewith history of type 2 diabetes, thrombocytopenia, BPPV, hypertension, CAD status post CABG,HFpEF,sarcoidosis, TIA, splenomegaly, nephrolithiasis, who presents with weakness and frequent falls.  Patient also has dizziness and lightheadedness. Currently, is complaining left ankle pain from the fall.  X-ray showed soft tissue swelling without fracture.  No DVT. He was also found to have acute kidney injury with mild hyperkalemia today.  Receiving IV fluids.     Assessment & Plan:   Active Problems:   CAD (coronary artery disease)   Diabetes mellitus ([place])   Thrombocytopenia ([place])   AKI (acute kidney injury) ([place])  #1.  Frequent falls with deconditioning. Physical therapy/Occupational Therapy recommended home with home care and physical therapy.  Spoke with Education officer, museum, she is not able to set up today.  Planning to discharge her tomorrow.  2.  Acute kidney injury with mild hyperkalemia. Potassium level normalized, renal function is better. Bladder scan did not show any urinary retention.  3.  Left ankle sprain. No fracture.  Continue symptomatic treatment.  4.  Chronic thrombocytopenia. Follow.  5.  Type 2 diabetes.  Continue current regimen.  6.  Loose stools. Send C. difficile toxin.   DVT prophylaxis:Lovenox Code Status: Full Family Communication: None Disposition Plan:   Patient came from: Home                                                                                                               Anticipated d/c place: Home  Barriers to d/c OR conditions which need to be met to effect a safe d/c: We'll discharge home tomorrow when renal function is better.  Consultants:   None  Procedures: None Antimicrobials:None  Subjective: Patient doing well  today.  Still have significant weakness, but able to walk to the bathroom.  Denies any short of breath or cough. Reported some loose stools today, send C. difficile toxin.  Objective: Vitals:   02/14/20 1059 02/14/20 1101 02/14/20 1103 02/14/20 1104  BP: 116/70 101/63 (!) 93/56 92/71  Pulse: 70 74 78 76  Resp: 18     Temp: 97.7 F (36.5 C)     TempSrc: Oral     SpO2: 97%     Weight:      Height:        Intake/Output Summary (Last 24 hours) at 02/14/2020 1440 Last data filed at 02/14/2020 0533 Gross per 24 hour  Intake 265 ml  Output 1300 ml  Net -1035 ml   Filed Weights   02/12/20 1622 02/13/20 0933  Weight: 81.8 kg 81.2 kg    Examination:  General exam: Appears calm and comfortable  Respiratory system: Clear to auscultation. Respiratory effort normal. Cardiovascular system: S1 & S2 heard, RRR. No JVD, murmurs, rubs, gallops or clicks. No pedal edema. Gastrointestinal system: Abdomen is nondistended, soft and nontender. No organomegaly or masses felt. Normal bowel sounds heard.  Central nervous system: Alert and oriented. No focal neurological deficits. Extremities: Symmetric  Skin: No rashes, lesions or ulcers Psychiatry:  Mood & affect appropriate.     Data Reviewed: I have personally reviewed following labs and imaging studies  CBC: Recent Labs  Lab 02/12/20 1624 02/13/20 0737 02/14/20 0548  WBC 5.7 4.9 5.3  NEUTROABS  --   --  3.8  HGB 10.7* 10.7* 11.3*  HCT 30.4* 31.9* 32.2*  MCV 80.6 82.6 79.5*  PLT 87* 80* 82*   Basic Metabolic Panel: Recent Labs  Lab 02/12/20 1624 02/13/20 0737 02/14/20 0548  NA 137 138 139  K 4.7 5.3* 3.9  CL 105 118* 107  CO2 22 32 27  GLUCOSE 335* 250* 170*  BUN 40* 35* 25*  CREATININE 1.74* 1.67* 1.45*  CALCIUM 8.2* 9.5 8.7*   GFR: Estimated Creatinine Clearance: 40.7 mL/min (A) (by C-G formula based on SCr of 1.45 mg/dL (H)). Liver Function Tests: Recent Labs  Lab 02/12/20 1630  AST 16  ALT 14  ALKPHOS 68   BILITOT 0.8  PROT 5.7*  ALBUMIN 3.4*   No results for input(s): LIPASE, AMYLASE in the last 168 hours. No results for input(s): AMMONIA in the last 168 hours. Coagulation Profile: No results for input(s): INR, PROTIME in the last 168 hours. Cardiac Enzymes: No results for input(s): CKTOTAL, CKMB, CKMBINDEX, TROPONINI in the last 168 hours. BNP (last 3 results) No results for input(s): PROBNP in the last 8760 hours. HbA1C: Recent Labs    02/12/20 1630  HGBA1C 9.5*   CBG: Recent Labs  Lab 02/13/20 1324 02/13/20 1647 02/13/20 2129 02/14/20 0811 02/14/20 1203  GLUCAP 114* 142* 167* 154* 123*   Lipid Profile: No results for input(s): CHOL, HDL, LDLCALC, TRIG, CHOLHDL, LDLDIRECT in the last 72 hours. Thyroid Function Tests: No results for input(s): TSH, T4TOTAL, FREET4, T3FREE, THYROIDAB in the last 72 hours. Anemia Panel: No results for input(s): VITAMINB12, FOLATE, FERRITIN, TIBC, IRON, RETICCTPCT in the last 72 hours. Sepsis Labs: No results for input(s): PROCALCITON, LATICACIDVEN in the last 168 hours.  Recent Results (from the past 240 hour(s))  SARS Coronavirus 2 by RT PCR (hospital order, performed in [facility] hospital lab) Nasopharyngeal Nasopharyngeal Swab     Status: None   Collection Time: 02/12/20 10:31 PM   Specimen: Nasopharyngeal Swab  Result Value Ref Range Status   SARS Coronavirus 2 NEGATIVE NEGATIVE Final    Comment: (NOTE) SARS-CoV-2 target nucleic acids are NOT DETECTED.  The SARS-CoV-2 RNA is generally detectable in upper and lower respiratory specimens during the acute phase of infection. The lowest concentration of SARS-CoV-2 viral copies this assay can detect is 250 copies / mL. A negative result does not preclude SARS-CoV-2 infection and should not be used as the sole basis for treatment or other patient management decisions.  A negative result may occur with improper specimen collection / handling, submission of specimen other than  nasopharyngeal swab, presence of viral mutation(s) within the areas targeted by this assay, and inadequate number of viral copies (<250 copies / mL). A negative result must be combined with clinical observations, patient history, and epidemiological information.  Fact Sheet for Patients:   StrictlyIdeas.no  Fact Sheet for Healthcare Providers: BankingDealers.co.za  This test is not yet approved or  cleared by the [place] FDA and has been authorized for detection and/or diagnosis of SARS-CoV-2 by FDA under an Emergency Use Authorization (EUA).  This EUA will remain in effect (meaning this test can be used) for  the duration of the COVID-19 declaration under Section 564(b)(1) of the Act, 21 U.S.C. section 360bbb-3(b)(1), unless the authorization is terminated or revoked sooner.  Performed at [facility], [address], [place], [place] [address]          Radiology Studies: DG Ankle Complete Left  Result Date: 02/12/2020 CLINICAL DATA:  Initial evaluation for acute trauma, fall, pain. EXAM: LEFT ANKLE COMPLETE - 3+ VIEW COMPARISON:  None available. FINDINGS: No acute fracture dislocation. Ankle mortise approximated. Talar dome intact. Posterior plantar calcaneal enthesophytes noted. Mild osteopenia. Diffuse soft tissue swelling present about the ankle. IMPRESSION: 1. No acute fracture or dislocation. 2. Diffuse soft tissue swelling about the ankle. Electronically Signed   By: Jeannine Boga M.D.   On: 02/12/2020 19:59   CT Head Wo Contrast  Result Date: 02/12/2020 CLINICAL DATA:  Headaches and left-sided shoulder pain, history of recent falls, initial encounter EXAM: CT HEAD WITHOUT CONTRAST CT CERVICAL SPINE WITHOUT CONTRAST TECHNIQUE: Multidetector CT imaging of the head and cervical spine was performed following the standard protocol without intravenous contrast. Multiplanar CT image reconstructions of the  cervical spine were also generated. COMPARISON:  04/07/2019 FINDINGS: CT HEAD FINDINGS Brain: No evidence of acute infarction, hemorrhage, hydrocephalus, extra-axial collection or mass lesion/mass effect. Mild atrophic and chronic white matter ischemic changes are noted. Vascular: No hyperdense vessel or unexpected calcification. Skull: Normal. Negative for fracture or focal lesion. Sinuses/Orbits: No acute finding. Other: None. CT CERVICAL SPINE FINDINGS Alignment: Mild straightening of the normal cervical lordosis is noted. Skull base and vertebrae: Mild degenerative anterolisthesis of C4 on C5 is noted. Disc space narrowing is noted at C4-5, C5-6 and C6-7. Associated osteophytic changes are noted at these levels as well. Mild facet hypertrophic changes are noted without acute abnormality. No acute fracture or acute facet abnormality is seen. Soft tissues and spinal canal: There is a hypodensity identified within the right lobe of the thyroid measuring 17 mm. This is stable in appearance from the prior exam. Upper chest: Scarring is noted in the upper lung fields bilaterally. This is stable from the prior study. Other: None IMPRESSION: CT of the head: Chronic atrophic and ischemic changes without acute abnormality. CT of the cervical spine: Degenerative change without acute abnormality. Right thyroid nodule measuring 17 mm. No follow-up recommended unless clinically warranted (ref: J Am Coll Radiol. 2015 Feb;12(2): 143-50). Electronically Signed   By: Inez Catalina M.D.   On: 02/12/2020 19:06   CT Cervical Spine Wo Contrast  Result Date: 02/12/2020 CLINICAL DATA:  Headaches and left-sided shoulder pain, history of recent falls, initial encounter EXAM: CT HEAD WITHOUT CONTRAST CT CERVICAL SPINE WITHOUT CONTRAST TECHNIQUE: Multidetector CT imaging of the head and cervical spine was performed following the standard protocol without intravenous contrast. Multiplanar CT image reconstructions of the cervical spine  were also generated. COMPARISON:  04/07/2019 FINDINGS: CT HEAD FINDINGS Brain: No evidence of acute infarction, hemorrhage, hydrocephalus, extra-axial collection or mass lesion/mass effect. Mild atrophic and chronic white matter ischemic changes are noted. Vascular: No hyperdense vessel or unexpected calcification. Skull: Normal. Negative for fracture or focal lesion. Sinuses/Orbits: No acute finding. Other: None. CT CERVICAL SPINE FINDINGS Alignment: Mild straightening of the normal cervical lordosis is noted. Skull base and vertebrae: Mild degenerative anterolisthesis of C4 on C5 is noted. Disc space narrowing is noted at C4-5, C5-6 and C6-7. Associated osteophytic changes are noted at these levels as well. Mild facet hypertrophic changes are noted without acute abnormality. No acute fracture or acute facet  abnormality is seen. Soft tissues and spinal canal: There is a hypodensity identified within the right lobe of the thyroid measuring 17 mm. This is stable in appearance from the prior exam. Upper chest: Scarring is noted in the upper lung fields bilaterally. This is stable from the prior study. Other: None IMPRESSION: CT of the head: Chronic atrophic and ischemic changes without acute abnormality. CT of the cervical spine: Degenerative change without acute abnormality. Right thyroid nodule measuring 17 mm. No follow-up recommended unless clinically warranted (ref: J Am Coll Radiol. 2015 Feb;12(2): 143-50). Electronically Signed   By: Inez Catalina M.D.   On: 02/12/2020 19:06   US Venous Img Lower Unilateral Left  Result Date: 02/12/2020 CLINICAL DATA:  Pain and swelling for 1 year. EXAM: LEFT LOWER EXTREMITY VENOUS DOPPLER ULTRASOUND TECHNIQUE: Gray-scale sonography with compression, as well as color and duplex ultrasound, were performed to evaluate the deep venous system(s) from the level of the common femoral vein through the popliteal and proximal calf veins. COMPARISON:  Venous ultrasound 02/07/2016  FINDINGS: VENOUS Normal compressibility of the common femoral, superficial femoral, and popliteal veins, as well as the visualized calf veins. Visualized portions of profunda femoral vein and great saphenous vein unremarkable. No filling defects to suggest DVT on grayscale or color Doppler imaging. Doppler waveforms show normal direction of venous flow, normal respiratory plasticity and response to augmentation. Limited views of the contralateral common femoral vein are unremarkable. OTHER None. Limitations: None IMPRESSION: No evidence of left lower extremity DVT. Electronically Signed   By: Keith Rake M.D.   On: 02/12/2020 20:25   DG Chest Portable 1 View  Result Date: 02/12/2020 CLINICAL DATA:  Weakness, frequent fall, history of sarcoid EXAM: PORTABLE CHEST 1 VIEW COMPARISON:  05/20/2018, 01/30/2017 CT chest 08/27/2019 FINDINGS: Post sternotomy changes. Diffusely abnormal appearance of the lung parenchyma with calcified and noncalcified lung nodules. Irregular foci of opacity bilaterally, increased radiographically since 2019. Stable cardiomediastinal silhouette. No pneumothorax. IMPRESSION: Diffusely abnormal appearance of the lung parenchyma with calcified and noncalcified lung nodules. Overall increased bilateral irregular foci of airspace disease, either reflecting acute superimposed infectious process including viral pneumonia versus disease progression. Electronically Signed   By: Donavan Foil M.D.   On: 02/12/2020 18:42   DG Shoulder Left  Result Date: 02/12/2020 CLINICAL DATA:  Initially a shin for acute pain status post recent trauma, fall. EXAM: LEFT SHOULDER - 2+ VIEW COMPARISON:  Prior radiograph from 08/27/2019. FINDINGS: No acute fracture dislocation. Humeral head in normal alignment with the glenoid. AC joint approximated. Mild osteoarthritic changes present about the shoulder. No periarticular calcification. No visible soft tissue injury. Mild osteopenia. IMPRESSION: No acute  osseous abnormality about the left shoulder. Electronically Signed   By: Jeannine Boga M.D.   On: 02/12/2020 20:01   CT Renal Stone Study  Result Date: 02/12/2020 CLINICAL DATA:  Flank pain. Kidney stone suspected. History of stones. Patient reports weakness and frequent falls. Left-sided abdominal pain. EXAM: CT ABDOMEN AND PELVIS WITHOUT CONTRAST TECHNIQUE: Multidetector CT imaging of the abdomen and pelvis was performed following the standard protocol without IV contrast. COMPARISON:  CT 1 month ago 01/17/2020, additional priors FINDINGS: Lower chest: Stable pleural and diaphragmatic calcifications and calcified nodules. Calcified lower mediastinal and paraesophageal nodes are unchanged. No acute airspace disease. No pleural fluid. Heart is normal in size. There are coronary artery calcifications. Hepatobiliary: No focal hepatic abnormality on noncontrast exam. Equivocal capsular nodularity. Gallbladder physiologically distended, no calcified stone. No biliary dilatation. Pancreas: No ductal dilatation or  inflammation. Stable coarse calcifications in the pancreatic head. Spleen: Chronic splenomegaly measuring up to 15.7 cm AP. No focal abnormality. Adrenals/Urinary Tract: Stable adrenal thickening without dominant nodule. There is chronic dilatation of the left renal pelvis. No evidence of ureteral calculi. 8 mm nonobstructing stone in the lower left kidney. There is no right hydronephrosis or stone. Cyst in the right kidney is not as well visualized in the absence of IV contrast. Mild chronic bladder wall thickening with left anterior bladder diverticulum unchanged. Stomach/Bowel: Patulous distal esophagus. Nondistended stomach. Normal positioning of the duodenum and ligament of Treitz. There is no small bowel obstruction or inflammatory change. Normal appendix. Small volume of stool in the proximal colon. The distal colon is decompressed. Occasional descending and sigmoid colonic diverticula without  diverticulitis. Mild sigmoid colonic tortuosity. Vascular/Lymphatic: Aortic atherosclerosis. No aortic aneurysm. There are small retroperitoneal nodes that are better assessed on prior contrast-enhanced exam. No evidence of bulky adenopathy. 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 Other: No ascites.  No free air.  Fat in both inguinal canals. Musculoskeletal: Chronic L1 compression fracture. This is unchanged from prior exams. Remote posterior left 10 rib fracture. There are no acute or suspicious osseous abnormalities. IMPRESSION: 1. Nonobstructing 8 mm stone in the lower left kidney. Chronic prominence of the left renal pelvis without evidence of obstructing stone or ureteral calculus. 2. Chronic bladder wall thickening and left anterior bladder diverticulum. Enlarged prostate gland causing mass effect on the bladder base. 3. Equivocal capsular nodularity of the liver, can be seen with cirrhosis. Chronic splenomegaly. 4. Colonic diverticulosis without diverticulitis. Aortic Atherosclerosis (ICD10-I70.0). Electronically Signed   By: Keith Rake M.D.   On: 02/12/2020 19:14        Scheduled Meds: . atorvastatin  80 mg Oral QHS  . clopidogrel  75 mg Oral Daily  . enoxaparin (LOVENOX) injection  40 mg Subcutaneous Q24H  . furosemide  20 mg Oral QODAY  . gabapentin  100 mg Oral BID  . insulin aspart  0-15 Units Subcutaneous TID WC  . insulin aspart  0-5 Units Subcutaneous QHS  . insulin aspart  4 Units Subcutaneous TID WC  . insulin glargine  20 Units Subcutaneous QHS  . isosorbide mononitrate  120 mg Oral Daily  . metoprolol succinate  150 mg Oral Daily  . pantoprazole  40 mg Oral Daily  . sertraline  200 mg Oral Daily  . sodium chloride flush  3 mL Intravenous Q12H  . tamsulosin  0.4 mg Oral QHS   Continuous Infusions:   LOS: 2 days    Time spent: 28 minutes    Sharen Hones, MD [facility]   To contact the attending  provider between 7A-7P or the covering provider during after hours 7P-7A, please log into the web site [email or web address] and access using universal [place] password for that web site. If you do not have the password, please call the hospital operator.  02/14/2020, 2:40 PM

## 2020-02-15 ENCOUNTER — Encounter: Payer: Self-pay | Admitting: Family Medicine

## 2020-02-15 DIAGNOSIS — Z008 Encounter for other general examination: Secondary | ICD-10-CM

## 2020-02-15 LAB — GLUCOSE, CAPILLARY
Glucose-Capillary: 126 mg/dL — ABNORMAL HIGH (ref 70–99)
Glucose-Capillary: 205 mg/dL — ABNORMAL HIGH (ref 70–99)
Glucose-Capillary: 258 mg/dL — ABNORMAL HIGH (ref 70–99)

## 2020-02-15 NOTE — TOC Progression Note (Signed)
Transition of Care [facility]) - Progression Note    Patient Details  Name: Peter Robertson MRN: [id number] Date of Birth: 01/29/1941  Transition of Care [facility]) CM/SW [place], [place] Phone Number:  [phone] 02/15/2020, 4:08 PM  Clinical Narrative:     CSW spoke with patient's neighbor Ms. Judeth Horn and she gave me the name for patient's brother Peter Robertson [phone].  This CSW spoke with Mr. Salvino to update him on patient status and discuss patient disposition.Mr. Helm stated he thinks the patient may be safer in an assisted living facility but he was not sure if he would go.  This CSW explained the Attending could request an assessment for competency, and Mr. Mccarrick agreed.  Mr. Penn stated Ms. Judeth Horn is able to receive in formation on patient status and assist with decision making.  This CSW explained to Mr. Cavell there would be a different CSW for Mr. Cecchi tomorrow but he could contact this CSW if he had any question.    Attending stated he will request assessment for competency.    Expected Discharge Plan: [place] Barriers to Discharge: Continued Medical Work up  Expected Discharge Plan and Services Expected Discharge Plan: [place] In-house Referral: Clinical Social Work     Living arrangements for the past 2 months: Single Family Home Expected Discharge Date: 02/15/20                                     Social Determinants of Health (SDOH) Interventions    Readmission Risk Interventions No flowsheet data found.

## 2020-02-15 NOTE — Consult Note (Signed)
[facility] Consult   Reason for Consult:  Capacity Evalaution Referring Physician:  Dr Roosevelt Locks Patient Identification: Peter Robertson MRN:  [id number] Principal Diagnosis: Falls Diagnosis:  Active Problems:   Encounter for psychological evaluation   CAD (coronary artery disease)   Diabetes mellitus ([place])   Thrombocytopenia ([place])   AKI (acute kidney injury) ([place])   Total Time spent with patient: 45 minutes  Subjective:   Peter Robertson is a 79 y.o. male patient admitted with falls.  Patient seen and evaluated in person by provider. Patient was alert and oriented x4, pleasant to speak with, able to engage in conversation. Patient currently has no desire to go anywhere but to his own home. He was able to verbalize that he would first call his neighbor, then his brother, then EMT at 70 in case of fall or medical issue. Patient states that he has a special commode and also a shower chair for his safety. He verbalized he has a dog that does not get in his way or between his feet, dog lays next to his bed and chair. Patient states that there are no stairs in his home and he has no safety concerns at this time. Patient answered all questions appropriately, no capacity concerns noted throughout interview.  He has the capacity to make sound medical decisions at this time.  HPI per MD:    Peter Robertson is a 79 y.o. male with history of type 2 diabetes, thrombocytopenia, BPPV, hypertension, CAD status post CABG, HFpEF, sarcoidosis, TIA, splenomegaly, nephrolithiasis, who presents with weakness and frequent falls.  On interview patient states that he presented for multiple issues, including chest pain, left leg swelling, dizziness, and falls.  With gradual chest pain he states it is left-sided, constant, worse with deep inspiration or coughing, and does not feel like angina.  He states that he thinks that the chest pain is due to falling.  With regard to his left leg swelling he admits that  it is chronic.  With regards to dizziness and falls he also admits that this is chronic and has been happening for several months.  He thinks it is due to a combination of his left leg giving out and dizziness but is unable to give further details.  He states that he lives alone, that his wife and children have passed away, and he is helped primarily by his neighbor Lorinda Creed.  I spoke with Lorinda Creed on the phone. She, her husband, her brother, and her sister-in-law all help to care for him, she lives a few houses down the road from him and help him with his medications. She has not officially been made his POA. She helps him with his bills and other household tasks. She states he has been unsteady on his feet for the past year. She has been trying to take care of him but he is having increasing needs and she is having health problems of her own. She thinks his vision has deteriorated significantly due to his macular degeneration. She has also been worried about his memory. He has left his stove on, has mentioned he has seen people in suits cutting down his hedges but no one else sees these things, and other unusual things.   Review chart shows that he saw his cardiologist in June 2021 (note in care everywhere).  During that visit cardiology documented that patient's primary concern was frequent falls and his left leg giving out.  It is noted in their documentation  that neither issue was new and both were chronic.  At that time the cardiologist recommended to be admitted to the hospital for further evaluation as well as a consideration of moving to an assisted living facility, he declined at that time.  Another note in care everywhere from October 16, 2019 with Dr. Ouida Sills notes longstanding history of vertigo, falls, as well as mention of a prior thorough work-up by neurology with symptoms thought to be most consistent with BPPV.  Note also made at that time of longstanding thrombocytopenia.  In the  ED initial vital signs unremarkable. Lab work-up notable for CMP showing AKI with elevated creatinine of 1.7, and hyperglycemia with glucose 335. Serial troponins were negative. CBC showed stable mild anemia and somewhat worsening of his chronic thrombocytopenia with platelets of 87. UA was unremarkable. EKG was nonischemic and unchanged from prior. CT head and cervical spine showed no acute findings, CT renal stone study showed no acute findings, plain film of the left ankle and shoulder did not show any acute findings, and duplex of the left lower extremity did not show any evidence of DVT. Is given a 1 L normal saline bolus.  Past Psychiatric History: none  Risk to Self:  none Risk to Others:  none Prior Inpatient Therapy:  none Prior Outpatient Therapy:  none  Past Medical History:  Past Medical History:  Diagnosis Date  . Asthma   . BPH (benign prostatic hyperplasia)   . Chronic kidney disease    Kidney Stones  . Coronary artery disease   . Diabetes mellitus without complication ([place])   . Heart murmur   . Hyperlipidemia   . Hypertension   . Myocardial infarction [facility])     Past Surgical History:  Procedure Laterality Date  . COLONOSCOPY WITH PROPOFOL N/A 12/03/2014   Procedure: COLONOSCOPY WITH PROPOFOL;  Surgeon: Manya Silvas, MD;  Location: [facility] ENDOSCOPY;  Service: Endoscopy;  Laterality: N/A;  . CORONARY ARTERY BYPASS GRAFT    . LEFT HEART CATH AND CORS/GRAFTS ANGIOGRAPHY N/A 05/22/2018   Procedure: LEFT HEART CATH AND CORS/GRAFTS ANGIOGRAPHY poss PCI;  Surgeon: Minna Merritts, MD;  Location: [facility] CV LAB;  Service: Cardiovascular;  Laterality: N/A;   Family History:  Family History  Problem Relation Age of Onset  . Diabetes Mother   . Cancer Mother   . Hypertension Father   . Hypertension Brother   . Diabetes Brother    Family Psychiatric  History: none Social History:  Social History   Substance and Sexual Activity  Alcohol Use No     Social  History   Substance and Sexual Activity  Drug Use No    Social History   Socioeconomic History  . Marital status: Married    Spouse name: Not on file  . Number of children: Not on file  . Years of education: Not on file  . Highest education level: Not on file  Occupational History  . Occupation: retired  Tobacco Use  . Smoking status: Former Smoker    Quit date: 06/19/1984    Years since quitting: 35.6  . Smokeless tobacco: Never Used  Vaping Use  . Vaping Use: Never used  Substance and Sexual Activity  . Alcohol use: No  . Drug use: No  . Sexual activity: Not on file  Other Topics Concern  . Not on file  Social History Narrative  . Not on file   Social Determinants of Health   Financial Resource Strain:   . Difficulty of  Paying Living Expenses: Not on file  Food Insecurity:   . Worried About Charity fundraiser in the Last Year: Not on file  . Ran Out of Food in the Last Year: Not on file  Transportation Needs:   . Lack of Transportation (Medical): Not on file  . Lack of Transportation (Non-Medical): Not on file  Physical Activity:   . Days of Exercise per Week: Not on file  . Minutes of Exercise per Session: Not on file  Stress:   . Feeling of Stress : Not on file  Social Connections:   . Frequency of Communication with Friends and Family: Not on file  . Frequency of Social Gatherings with Friends and Family: Not on file  . Attends Religious Services: Not on file  . Active Member of Clubs or Organizations: Not on file  . Attends Archivist Meetings: Not on file  . Marital Status: Not on file   Additional Social History:    Allergies:   Allergies  Allergen Reactions  . Sulfamethoxazole-Trimethoprim Other (See Comments)    dizziness dizziness dizziness   . Tape Rash    blisters    Labs:  Results for orders placed or performed during the hospital encounter of 02/12/20 (from the past 48 hour(s))  Glucose, capillary     Status: Abnormal    Collection Time: 02/13/20  9:29 PM  Result Value Ref Range   Glucose-Capillary 167 (H) 70 - 99 mg/dL    Comment: Glucose reference range applies only to samples taken after fasting for at least 8 hours.  Basic metabolic panel     Status: Abnormal   Collection Time: 02/14/20  5:48 AM  Result Value Ref Range   Sodium 139 135 - 145 mmol/L   Potassium 3.9 3.5 - 5.1 mmol/L   Chloride 107 98 - 111 mmol/L   CO2 27 22 - 32 mmol/L   Glucose, Bld 170 (H) 70 - 99 mg/dL    Comment: Glucose reference range applies only to samples taken after fasting for at least 8 hours.   BUN 25 (H) 8 - 23 mg/dL   Creatinine, Ser 1.45 (H) 0.61 - 1.24 mg/dL   Calcium 8.7 (L) 8.9 - 10.3 mg/dL   GFR calc non Af Amer 45 (L) >60 mL/min   GFR calc Af Amer 52 (L) >60 mL/min   Anion gap 5 5 - 15    Comment: Performed at [facility], [address] [place],  [address]  CBC with Differential/Platelet     Status: Abnormal   Collection Time: 02/14/20  5:48 AM  Result Value Ref Range   WBC 5.3 4.0 - 10.5 K/uL   RBC 4.05 (L) 4.22 - 5.81 MIL/uL   Hemoglobin 11.3 (L) 13.0 - 17.0 g/dL   HCT 32.2 (L) 39 - 52 %   MCV 79.5 (L) 80.0 - 100.0 fL   MCH 27.9 26.0 - 34.0 pg   MCHC 35.1 30.0 - 36.0 g/dL   RDW 16.7 (H) 11.5 - 15.5 %   Platelets 82 (L) 150 - 400 K/uL    Comment: REPEATED TO VERIFY Immature Platelet Fraction may be clinically indicated, consider ordering this additional test [id number]    nRBC 0.0 0.0 - 0.2 %   Neutrophils Relative % 74 %   Neutro Abs 3.8 1.7 - 7.7 K/uL   Lymphocytes Relative 14 %   Lymphs Abs 0.8 0.7 - 4.0 K/uL   Monocytes Relative 10 %   Monocytes Absolute 0.5 0 -  1 K/uL   Eosinophils Relative 2 %   Eosinophils Absolute 0.1 0 - 0 K/uL   Basophils Relative 0 %   Basophils Absolute 0.0 0 - 0 K/uL   Immature Granulocytes 0 %   Abs Immature Granulocytes 0.02 0.00 - 0.07 K/uL    Comment: Performed at [facility], [address] [place], [place] [address]  Glucose,  capillary     Status: Abnormal   Collection Time: 02/14/20  8:11 AM  Result Value Ref Range   Glucose-Capillary 154 (H) 70 - 99 mg/dL    Comment: Glucose reference range applies only to samples taken after fasting for at least 8 hours.   Comment 1 Notify RN   Glucose, capillary     Status: Abnormal   Collection Time: 02/14/20 12:03 PM  Result Value Ref Range   Glucose-Capillary 123 (H) 70 - 99 mg/dL    Comment: Glucose reference range applies only to samples taken after fasting for at least 8 hours.   Comment 1 Notify RN   Glucose, capillary     Status: Abnormal   Collection Time: 02/14/20  4:42 PM  Result Value Ref Range   Glucose-Capillary 134 (H) 70 - 99 mg/dL    Comment: Glucose reference range applies only to samples taken after fasting for at least 8 hours.   Comment 1 Notify RN   Glucose, capillary     Status: Abnormal   Collection Time: 02/14/20  9:07 PM  Result Value Ref Range   Glucose-Capillary 135 (H) 70 - 99 mg/dL    Comment: Glucose reference range applies only to samples taken after fasting for at least 8 hours.  Glucose, capillary     Status: Abnormal   Collection Time: 02/15/20  7:31 AM  Result Value Ref Range   Glucose-Capillary 126 (H) 70 - 99 mg/dL    Comment: Glucose reference range applies only to samples taken after fasting for at least 8 hours.  Glucose, capillary     Status: Abnormal   Collection Time: 02/15/20  4:56 PM  Result Value Ref Range   Glucose-Capillary 205 (H) 70 - 99 mg/dL    Comment: Glucose reference range applies only to samples taken after fasting for at least 8 hours.    Current Facility-Administered Medications  Medication Dose Route Frequency Provider Last Rate Last Admin  . acetaminophen (TYLENOL) tablet 650 mg  650 mg Oral Q6H PRN Clarnce Flock, MD   650 mg at 02/14/20 1412   Or  . acetaminophen (TYLENOL) suppository 650 mg  650 mg Rectal Q6H PRN Clarnce Flock, MD      . atorvastatin (LIPITOR) tablet 80 mg  80 mg Oral QHS  Clarnce Flock, MD   80 mg at 02/14/20 1717  . clopidogrel (PLAVIX) tablet 75 mg  75 mg Oral Daily Clarnce Flock, MD   75 mg at 02/15/20 0920  . enoxaparin (LOVENOX) injection 40 mg  40 mg Subcutaneous Q24H Clarnce Flock, MD   40 mg at 02/15/20 0550  . furosemide (LASIX) tablet 20 mg  20 mg Oral Brantley Persons, MD   20 mg at 02/15/20 0919  . gabapentin (NEURONTIN) capsule 100 mg  100 mg Oral BID Clarnce Flock, MD   100 mg at 02/15/20 0920  . insulin aspart (novoLOG) injection 0-15 Units  0-15 Units Subcutaneous TID WC Clarnce Flock, MD   2 Units at 02/15/20 0919  . insulin aspart (novoLOG) injection 0-5 Units  0-5  Units Subcutaneous QHS Clarnce Flock, MD      . insulin aspart (novoLOG) injection 4 Units  4 Units Subcutaneous TID WC Clarnce Flock, MD   4 Units at 02/15/20 0920  . insulin glargine (LANTUS) injection 20 Units  20 Units Subcutaneous QHS Clarnce Flock, MD   20 Units at 02/14/20 2121  . isosorbide mononitrate (IMDUR) 24 hr tablet 120 mg  120 mg Oral Daily Clarnce Flock, MD   120 mg at 02/15/20 0919  . metoprolol succinate (TOPROL-XL) 24 hr tablet 150 mg  150 mg Oral Daily Clarnce Flock, MD   150 mg at 02/15/20 0920  . nitroGLYCERIN (NITROSTAT) SL tablet 0.4 mg  0.4 mg Sublingual Q5 min PRN Clarnce Flock, MD      . ondansetron [facility]) tablet 4 mg  4 mg Oral Q6H PRN Clarnce Flock, MD       Or  . ondansetron [facility]) injection 4 mg  4 mg Intravenous Q6H PRN Clarnce Flock, MD      . oxyCODONE (Oxy IR/ROXICODONE) immediate release tablet 5 mg  5 mg Oral Q4H PRN Clarnce Flock, MD   5 mg at 02/14/20 0300  . pantoprazole (PROTONIX) EC tablet 40 mg  40 mg Oral Daily Clarnce Flock, MD   40 mg at 02/15/20 0920  . polyethylene glycol (MIRALAX / GLYCOLAX) packet 17 g  17 g Oral Daily PRN Clarnce Flock, MD      . sertraline (ZOLOFT) tablet 200 mg  200 mg Oral Daily Clarnce Flock, MD   200 mg at 02/15/20 0920   . sodium chloride flush (NS) 0.9 % injection 3 mL  3 mL Intravenous Q12H Clarnce Flock, MD   3 mL at 02/15/20 3734  . tamsulosin (FLOMAX) capsule 0.4 mg  0.4 mg Oral QHS Clarnce Flock, MD   0.4 mg at 02/14/20 2028    Musculoskeletal: Strength & Muscle Tone: within normal limits Gait & Station: normal Patient leans: N/A  Psychiatric Specialty Exam: Physical Exam Constitutional:      Appearance: Normal appearance.  Pulmonary:     Effort: Pulmonary effort is normal.  Musculoskeletal:     Cervical back: Normal range of motion.  Neurological:     Mental Status: He is alert.  Psychiatric:        Attention and Perception: Attention and perception normal.        Mood and Affect: Mood and affect normal.        Speech: Speech normal.        Behavior: Behavior normal. Behavior is cooperative.        Thought Content: Thought content normal.        Cognition and Memory: Cognition normal.        Judgment: Judgment normal.     Review of Systems  Blood pressure (!) 146/76, pulse 68, temperature (!) 97.4 F (36.3 C), temperature source Oral, resp. rate 18, height 5\' 6"  (1.676 m), weight 81.2 kg, SpO2 100 %.Body mass index is 28.91 kg/m.  General Appearance: Casual and Well Groomed  Eye Contact:  Good  Speech:  Clear and Coherent and Normal Rate  Volume:  Normal  Mood:  Subjectively good, animated  Affect:  Congruent  Thought Process:  Coherent, Goal Directed and Linear  Orientation:  Full (Time, Place, and Person)  Thought Content:  WDL and Logical  Suicidal Thoughts:  No  Homicidal Thoughts:  No  Memory:  Immediate;  Good Recent;   Good Remote;   Fair  Judgement:  Good  Insight:  Good  Psychomotor Activity:  Normal  Concentration:  Concentration: Good and Attention Span: Good  Recall:  Good  Fund of Knowledge:  Good  Language:  Good  Akathisia:  No  Handed:  Right  AIMS (if indicated):     Assets:  Communication Skills Housing Resilience Social Support   ADL's:  Intact  Cognition:  WNL  Sleep:        Treatment Plan Summary: Capacity Evaluation: Client has capacity to make medical decisions at this time.  He can state what he would do if he fell or had a medical emergency.    Disposition: No evidence of imminent risk to self or others at present.    Waylan Boga, NP 02/15/2020 5:37 PM

## 2020-02-15 NOTE — TOC Progression Note (Addendum)
Transition of Care [facility]) - Progression Note    Patient Details  Name: Peter Robertson MRN: [id number] Date of Birth: 11-Aug-1940  [facility] CM/SW [place], [place] Phone Number:  [phone] 02/15/2020, 2:05 PM  Clinical Narrative:      CSW tried to contact Lorinda Creed [facility]) [phone], and left voicemail.  There is concern for patient's safety if he d/c home alone.  Patient is dependant for assistance from neighbor.  If neighbor does not call back, CSW will need to send a [employer] for welfare check.   Expected Discharge Plan: [place] Barriers to Discharge: Continued Medical Work up  Expected Discharge Plan and Services Expected Discharge Plan: [place] In-house Referral: Clinical Social Work     Living arrangements for the past 2 months: Single Family Home Expected Discharge Date: 02/15/20                                     Social Determinants of Health (SDOH) Interventions    Readmission Risk Interventions No flowsheet data found.

## 2020-02-15 NOTE — Discharge Summary (Signed)
Physician Discharge Summary  Patient ID: Peter Robertson MRN: [id number] DOB/AGE: December 28, 1940 79 y.o.  Admit date: 02/12/2020 Discharge date: 02/15/2020  Admission Diagnoses:  Discharge Diagnoses:  Active Problems:   CAD (coronary artery disease)   Diabetes mellitus ([place])   Thrombocytopenia ([place])   AKI (acute kidney injury) ([place])   Discharged Condition: good  Hospital Course:  Peter Bolyard Blalockis a 79 y.o.malewith history of type 2 diabetes, thrombocytopenia, BPPV, hypertension, CAD status post CABG,HFpEF,sarcoidosis, TIA, splenomegaly, nephrolithiasis, who presents with weakness and frequent falls.Patient also has dizziness and lightheadedness. Currently, is complaining left ankle pain from the fall. X-ray showed soft tissue swelling without fracture. No DVT. He was also found to have acute kidney injury with mild hyperkalemia today. Receiving IV fluids.  #1.  Frequent falls with deconditioning. Physical therapy/Occupational Therapy recommended home with home care and physical therapy.  Spoke with Education officer, museum, patient does not need SNF per recommendation from PT and OT.  We will set up home care, home physical therapy and Occupational Therapy.  2.  Acute kidney injury with mild hyperkalemia. Potassium level normalized, renal function is better. Bladder scan did not show any urinary retention.  3.  Left ankle sprain. No fracture.    4.  Chronic thrombocytopenia. Stable  5.  Type 2 diabetes.   Resume home medicines.  6.  Loose stools. Patient had a 1 day of loose stools after giving Kayexalate.  Condition has resolved today.   Consults: None  Significant Diagnostic Studies:  CT ABDOMEN AND PELVIS WITHOUT CONTRAST  TECHNIQUE: Multidetector CT imaging of the abdomen and pelvis was performed following the standard protocol without IV contrast.  COMPARISON:  CT 1 month ago 01/17/2020, additional priors  FINDINGS: Lower chest: Stable pleural and  diaphragmatic calcifications and calcified nodules. Calcified lower mediastinal and paraesophageal nodes are unchanged. No acute airspace disease. No pleural fluid. Heart is normal in size. There are coronary artery calcifications.  Hepatobiliary: No focal hepatic abnormality on noncontrast exam. Equivocal capsular nodularity. Gallbladder physiologically distended, no calcified stone. No biliary dilatation.  Pancreas: No ductal dilatation or inflammation. Stable coarse calcifications in the pancreatic head.  Spleen: Chronic splenomegaly measuring up to 15.7 cm AP. No focal abnormality.  Adrenals/Urinary Tract: Stable adrenal thickening without dominant nodule. There is chronic dilatation of the left renal pelvis. No evidence of ureteral calculi. 8 mm nonobstructing stone in the lower left kidney. There is no right hydronephrosis or stone. Cyst in the right kidney is not as well visualized in the absence of IV contrast. Mild chronic bladder wall thickening with left anterior bladder diverticulum unchanged.  Stomach/Bowel: Patulous distal esophagus. Nondistended stomach. Normal positioning of the duodenum and ligament of Treitz. There is no small bowel obstruction or inflammatory change. Normal appendix. Small volume of stool in the proximal colon. The distal colon is decompressed. Occasional descending and sigmoid colonic diverticula without diverticulitis. Mild sigmoid colonic tortuosity.  Vascular/Lymphatic: Aortic atherosclerosis. No aortic aneurysm. There are small retroperitoneal nodes that are better assessed on prior contrast-enhanced exam. No evidence of bulky adenopathy.  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  Other: No ascites.  No free air.  Fat in both inguinal canals.  Musculoskeletal: Chronic L1 compression fracture. This is unchanged from prior exams. Remote posterior left 10 rib fracture. There  are no acute or suspicious osseous abnormalities.  IMPRESSION: 1. Nonobstructing 8 mm stone in the lower left kidney. Chronic prominence of the left renal pelvis without evidence of  obstructing stone or ureteral calculus. 2. Chronic bladder wall thickening and left anterior bladder diverticulum. Enlarged prostate gland causing mass effect on the bladder base. 3. Equivocal capsular nodularity of the liver, can be seen with cirrhosis. Chronic splenomegaly. 4. Colonic diverticulosis without diverticulitis.  Aortic Atherosclerosis (ICD10-I70.0).   Electronically Signed   By: Keith Rake M.D.   On: 02/12/2020 19:14  CT HEAD WITHOUT CONTRAST  CT CERVICAL SPINE WITHOUT CONTRAST  TECHNIQUE: Multidetector CT imaging of the head and cervical spine was performed following the standard protocol without intravenous contrast. Multiplanar CT image reconstructions of the cervical spine were also generated.  COMPARISON:  04/07/2019  FINDINGS: CT HEAD FINDINGS  Brain: No evidence of acute infarction, hemorrhage, hydrocephalus, extra-axial collection or mass lesion/mass effect. Mild atrophic and chronic white matter ischemic changes are noted.  Vascular: No hyperdense vessel or unexpected calcification.  Skull: Normal. Negative for fracture or focal lesion.  Sinuses/Orbits: No acute finding.  Other: None.  CT CERVICAL SPINE FINDINGS  Alignment: Mild straightening of the normal cervical lordosis is noted.  Skull base and vertebrae: Mild degenerative anterolisthesis of C4 on C5 is noted. Disc space narrowing is noted at C4-5, C5-6 and C6-7. Associated osteophytic changes are noted at these levels as well. Mild facet hypertrophic changes are noted without acute abnormality. No acute fracture or acute facet abnormality is seen.  Soft tissues and spinal canal: There is a hypodensity identified within the right lobe of the thyroid measuring 17 mm. This is  stable in appearance from the prior exam.  Upper chest: Scarring is noted in the upper lung fields bilaterally. This is stable from the prior study.  Other: None  IMPRESSION: CT of the head: Chronic atrophic and ischemic changes without acute abnormality.  CT of the cervical spine: Degenerative change without acute abnormality.  Right thyroid nodule measuring 17 mm. No follow-up recommended unless clinically warranted (ref: J Am Coll Radiol. 2015 Feb;12(2): 143-50).   Electronically Signed   By: Inez Catalina M.D.   On: 02/12/2020 19:06  LEFT ANKLE COMPLETE - 3+ VIEW  COMPARISON:  None available.  FINDINGS: No acute fracture dislocation. Ankle mortise approximated. Talar dome intact. Posterior plantar calcaneal enthesophytes noted. Mild osteopenia. Diffuse soft tissue swelling present about the ankle.  IMPRESSION: 1. No acute fracture or dislocation. 2. Diffuse soft tissue swelling about the ankle.   Electronically Signed   By: Jeannine Boga M.D.   On: 02/12/2020 19:59  LEFT SHOULDER - 2+ VIEW  COMPARISON:  Prior radiograph from 08/27/2019.  FINDINGS: No acute fracture dislocation. Humeral head in normal alignment with the glenoid. AC joint approximated. Mild osteoarthritic changes present about the shoulder. No periarticular calcification. No visible soft tissue injury. Mild osteopenia.  IMPRESSION: No acute osseous abnormality about the left shoulder.   Electronically Signed   By: Jeannine Boga M.D.   On: 02/12/2020 20:01  LEFT LOWER EXTREMITY VENOUS DOPPLER ULTRASOUND  TECHNIQUE: Gray-scale sonography with compression, as well as color and duplex ultrasound, were performed to evaluate the deep venous system(s) from the level of the common femoral vein through the popliteal and proximal calf veins.  COMPARISON:  Venous ultrasound 02/07/2016  FINDINGS: VENOUS  Normal compressibility of the common femoral,  superficial femoral, and popliteal veins, as well as the visualized calf veins. Visualized portions of profunda femoral vein and great saphenous vein unremarkable. No filling defects to suggest DVT on grayscale or color Doppler imaging. Doppler waveforms show normal direction of venous flow, normal respiratory plasticity and response  to augmentation.  Limited views of the contralateral common femoral vein are unremarkable.  OTHER  None.  Limitations: None  IMPRESSION: No evidence of left lower extremity DVT.   Electronically Signed   By: Keith Rake M.D.   On: 02/12/2020 20:25   Treatments: IV fluids and symptomatic treatment.  Discharge Exam: Blood pressure (!) 146/76, pulse 68, temperature (!) 97.4 F (36.3 C), temperature source Oral, resp. rate 18, height 5\' 6"  (1.676 m), weight 81.2 kg, SpO2 100 %. General appearance: alert, cooperative and Oriented to time place and person. Resp: clear to auscultation bilaterally Cardio: regular rate and rhythm, S1, S2 normal, no murmur, click, rub or gallop GI: soft, non-tender; bowel sounds normal; no masses,  no organomegaly Extremities: extremities normal, atraumatic, no cyanosis or edema  Disposition: Discharge disposition: 01-Home or Self Care       Discharge Instructions    Diet - low sodium heart healthy   Complete by: As directed    Increase activity slowly   Complete by: As directed      Allergies as of 02/15/2020      Reactions   Sulfamethoxazole-trimethoprim Other (See Comments)   dizziness dizziness dizziness   Tape Rash   blisters      Medication List    TAKE these medications   acetaminophen 500 MG tablet Commonly known as: TYLENOL Take 1,000 mg by mouth every 6 (six) hours as needed.   atorvastatin 80 MG tablet Commonly known as: LIPITOR Take 80 mg by mouth at bedtime.   cholecalciferol 25 MCG (1000 UNIT) tablet Commonly known as: VITAMIN D3 Take 1,000 Units by mouth 2 (two)  times daily.   clopidogrel 75 MG tablet Commonly known as: PLAVIX Take 75 mg by mouth daily.   furosemide 20 MG tablet Commonly known as: Lasix Take 1 tablet (20 mg total) by mouth every other day.   gabapentin 100 MG capsule Commonly known as: NEURONTIN Take 100 mg by mouth 2 (two) times daily.   HYDROcodone-acetaminophen 5-325 MG tablet Commonly known as: Norco Take 1 tablet by mouth every 6 (six) hours as needed for moderate pain.   insulin glargine 100 UNIT/ML injection Commonly known as: LANTUS Inject 38 Units into the skin at bedtime.   isosorbide mononitrate 120 MG 24 hr tablet Commonly known as: IMDUR Take 120 mg by mouth daily.   Melatonin 10 MG Caps Take 1 capsule by mouth at bedtime.   metFORMIN 500 MG tablet Commonly known as: GLUCOPHAGE Take 1,000 mg by mouth 2 (two) times daily.   metoprolol succinate 50 MG 24 hr tablet Commonly known as: TOPROL-XL at bedtime. TAKE 1 TABLET (50 MG TOTAL) BY MOUTH TAKE WITH 100 MG TABLET OF METOPROLOL (TOTAL DOSE 150MG )   metoprolol succinate 100 MG 24 hr tablet Commonly known as: TOPROL-XL at bedtime.   nitroGLYCERIN 0.4 MG SL tablet Commonly known as: NITROSTAT Place 0.4 mg under the tongue every 5 (five) minutes as needed for chest pain.   NovoLIN R 100 units/mL injection Generic drug: insulin regular Inject into the skin 3 (three) times daily before meals. Inject 0-0.025 mL (0-2.5 Units total) under the skin Three (3) times a day before meals. Insulin Sliding Scale: Blood Glucose - 151-200 mg/dL = 0.5 units; 201-250 mg/dL = 1 unit; 251-300 mg/dL = 1.5 units; 301-350 mg/dL= 2 units; 351-400 mg/dL = 2.5 units 10 mL   omeprazole 40 MG capsule Commonly known as: PRILOSEC Take 40 mg by mouth 2 (two) times daily.   PRESERVISION AREDS  2+MULTI VIT PO Take by mouth.   sertraline 100 MG tablet Commonly known as: ZOLOFT Take 200 mg by mouth daily.   tamsulosin 0.4 MG Caps capsule Commonly known as: FLOMAX Take 0.8  mg by mouth at bedtime.       Follow-up Information    Danae Orleans, MD Follow up in 1 week(s).   Specialty: Internal Medicine Contact information: [address] [facility] [place] [address] [phone]               Signed: Sharen Hones 02/15/2020, 11:03 AM

## 2020-02-16 LAB — GLUCOSE, CAPILLARY
Glucose-Capillary: 177 mg/dL — ABNORMAL HIGH (ref 70–99)
Glucose-Capillary: 91 mg/dL (ref 70–99)

## 2020-02-16 NOTE — TOC Progression Note (Addendum)
Transition of Care (TOC) - Progression Note    Patient Details  Name: Peter Robertson MRN: [id number] Date of Birth: 06/15/1941  Transition of Care (TOC) CM/SW Contact   C , LCSW Phone Number: 02/16/2020, 9:40 AM  Clinical Narrative: CSW met with patient. No supports at bedside. CSW introduced role and explained that therapy recommendations would be discussed. Patient agreeable to home health. Explained that his insurance is not a great payor for home health so his options may be limited. If we are unable to find an agency to accept him, encouraged him to reach out to his primary doctor in 1-2 weeks to see if staffing is any better or we could try to set up outpatient therapy. Patient's PCP is Yvonne Luyando, MD. He has a walker and crutches at home to use as needed. Patient has orders to discharge home today. Patient reports his neighbor [name] will be picking him up. CSW called and left her a voicemail.   10:15 am: Received call back from [name]. She expressed her concerns about patient returning home alone. CSW explained that unfortunately our hands are tied between capacity evaluation and PT eval. Patient walked 300 feet modified independent.  Expected Discharge Plan: Skilled Nursing Facility Barriers to Discharge: Continued Medical Work up  Expected Discharge Plan and Services Expected Discharge Plan: Skilled Nursing Facility In-house Referral: Clinical Social Work     Living arrangements for the past 2 months: Single Family Home Expected Discharge Date: 02/16/20                                     Social Determinants of Health (SDOH) Interventions    Readmission Risk Interventions No flowsheet data found.  

## 2020-02-16 NOTE — Progress Notes (Signed)
Peter Robertson  A and O x 4. VSS. Pt tolerating diet well. No complaints of pain or nausea. IV removed intact, no new  prescriptions given. Pt voiced understanding of discharge instructions with no further questions. Pt discharged via wheelchair with NT.   Allergies as of 02/16/2020      Reactions   Sulfamethoxazole-trimethoprim Other (See Comments)   dizziness dizziness dizziness   Tape Rash   blisters      Medication List    TAKE these medications   acetaminophen 500 MG tablet Commonly known as: TYLENOL Take 1,000 mg by mouth every 6 (six) hours as needed.   atorvastatin 80 MG tablet Commonly known as: LIPITOR Take 80 mg by mouth at bedtime.   cholecalciferol 25 MCG (1000 UNIT) tablet Commonly known as: VITAMIN D3 Take 1,000 Units by mouth 2 (two) times daily.   clopidogrel 75 MG tablet Commonly known as: PLAVIX Take 75 mg by mouth daily.   furosemide 20 MG tablet Commonly known as: Lasix Take 1 tablet (20 mg total) by mouth every other day.   gabapentin 100 MG capsule Commonly known as: NEURONTIN Take 100 mg by mouth 2 (two) times daily.   HYDROcodone-acetaminophen 5-325 MG tablet Commonly known as: Norco Take 1 tablet by mouth every 6 (six) hours as needed for moderate pain.   insulin glargine 100 UNIT/ML injection Commonly known as: LANTUS Inject 38 Units into the skin at bedtime.   isosorbide mononitrate 120 MG 24 hr tablet Commonly known as: IMDUR Take 120 mg by mouth daily.   Melatonin 10 MG Caps Take 1 capsule by mouth at bedtime.   metFORMIN 500 MG tablet Commonly known as: GLUCOPHAGE Take 1,000 mg by mouth 2 (two) times daily.   metoprolol succinate 50 MG 24 hr tablet Commonly known as: TOPROL-XL at bedtime. TAKE 1 TABLET (50 MG TOTAL) BY MOUTH TAKE WITH 100 MG TABLET OF METOPROLOL (TOTAL DOSE 150MG )   metoprolol succinate 100 MG 24 hr tablet Commonly known as: TOPROL-XL at bedtime.   nitroGLYCERIN 0.4 MG SL tablet Commonly known as:  NITROSTAT Place 0.4 mg under the tongue every 5 (five) minutes as needed for chest pain.   NovoLIN R 100 units/mL injection Generic drug: insulin regular Inject into the skin 3 (three) times daily before meals. Inject 0-0.025 mL (0-2.5 Units total) under the skin Three (3) times a day before meals. Insulin Sliding Scale: Blood Glucose - 151-200 mg/dL = 0.5 units; 201-250 mg/dL = 1 unit; 251-300 mg/dL = 1.5 units; 301-350 mg/dL= 2 units; 351-400 mg/dL = 2.5 units 10 mL   omeprazole 40 MG capsule Commonly known as: PRILOSEC Take 40 mg by mouth 2 (two) times daily.   PRESERVISION AREDS 2+MULTI VIT PO Take by mouth.   sertraline 100 MG tablet Commonly known as: ZOLOFT Take 200 mg by mouth daily.   tamsulosin 0.4 MG Caps capsule Commonly known as: FLOMAX Take 0.8 mg by mouth at bedtime.       Vitals:   02/16/20 0451 02/16/20 0921  BP: (!) 141/80 123/67  Pulse: 68 72  Resp: 20 20  Temp: 98.2 F (36.8 C) 98.1 F (36.7 C)  SpO2: 97%     Peter Robertson

## 2020-02-16 NOTE — Discharge Summary (Signed)
Physician Discharge Summary  Patient ID: Peter Robertson MRN: [id number] DOB/AGE: 1940-10-23 79 y.o.  Admit date: 02/12/2020 Discharge date: 02/16/2020  Admission Diagnoses:  Discharge Diagnoses:  Active Problems:   CAD (coronary artery disease)   Diabetes mellitus ([place])   Thrombocytopenia ([place])   AKI (acute kidney injury) ([place])   Encounter for psychological evaluation  Hospital Course:  Peter Haggart Blalockis a 79 y.o.malewith history of type 2 diabetes, thrombocytopenia, BPPV, hypertension, CAD status post CABG,HFpEF,sarcoidosis, TIA, splenomegaly, nephrolithiasis, who presents with weakness and frequent falls.Patient also has dizziness and lightheadedness. Currently, is complaining left ankle pain from the fall. X-ray showed soft tissue swelling without fracture. No DVT. He was also found to have acute kidney injury with mild hyperkalemia today. Receiving IV fluids.  #1. Frequent falls with deconditioning. Physical therapy/Occupational Therapy recommended home with home care and physical therapy. Spoke with Education officer, museum, patient does not need SNF per recommendation from PT and OT.  We will set up home care, home physical therapy and Occupational Therapy.  2. Acute kidney injury with mild hyperkalemia. Potassium level normalized, renal function is better. Bladder scan did not show any urinary retention.  3. Left ankle sprain. No fracture.   4. Chronic thrombocytopenia. Stable  5. Type 2 diabetes.  Resume home medicines.  6. Loose stools. Patient had a 1 day of loose stools after giving Kayexalate.  Condition has resolved today.   Patient was discharged yesterday, due to concern about family about patient mental status and the decision making capacity, patient discharged was delayed.  Psychiatry consult was obtained, patient was deemed to have decision-making capacity.  He insisted to go home, he will be discharged to home.  Home care, home physical therapy  occupational therapy has been set up.  Consults: None  Significant Diagnostic Studies:  CT ABDOMEN AND PELVIS WITHOUT CONTRAST  TECHNIQUE: Multidetector CT imaging of the abdomen and pelvis was performed following the standard protocol without IV contrast.  COMPARISON: CT 1 month ago 01/17/2020, additional priors  FINDINGS: Lower chest: Stable pleural and diaphragmatic calcifications and calcified nodules. Calcified lower mediastinal and paraesophageal nodes are unchanged. No acute airspace disease. No pleural fluid. Heart is normal in size. There are coronary artery calcifications.  Hepatobiliary: No focal hepatic abnormality on noncontrast exam. Equivocal capsular nodularity. Gallbladder physiologically distended, no calcified stone. No biliary dilatation.  Pancreas: No ductal dilatation or inflammation. Stable coarse calcifications in the pancreatic head.  Spleen: Chronic splenomegaly measuring up to 15.7 cm AP. No focal abnormality.  Adrenals/Urinary Tract: Stable adrenal thickening without dominant nodule. There is chronic dilatation of the left renal pelvis. No evidence of ureteral calculi. 8 mm nonobstructing stone in the lower left kidney. There is no right hydronephrosis or stone. Cyst in the right kidney is not as well visualized in the absence of IV contrast. Mild chronic bladder wall thickening with left anterior bladder diverticulum unchanged.  Stomach/Bowel: Patulous distal esophagus. Nondistended stomach. Normal positioning of the duodenum and ligament of Treitz. There is no small bowel obstruction or inflammatory change. Normal appendix. Small volume of stool in the proximal colon. The distal colon is decompressed. Occasional descending and sigmoid colonic diverticula without diverticulitis. Mild sigmoid colonic tortuosity.  Vascular/Lymphatic: Aortic atherosclerosis. No aortic aneurysm. There are small retroperitoneal nodes that are better  assessed on prior contrast-enhanced exam. No evidence of bulky adenopathy.  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  Other: No ascites. No free air. Fat in both inguinal canals.  Musculoskeletal: Chronic L1 compression fracture. This is unchanged from prior exams. Remote posterior left 10 rib fracture. There are no acute or suspicious osseous abnormalities.  IMPRESSION: 1. Nonobstructing 8 mm stone in the lower left kidney. Chronic prominence of the left renal pelvis without evidence of obstructing stone or ureteral calculus. 2. Chronic bladder wall thickening and left anterior bladder diverticulum. Enlarged prostate gland causing mass effect on the bladder base. 3. Equivocal capsular nodularity of the liver, can be seen with cirrhosis. Chronic splenomegaly. 4. Colonic diverticulosis without diverticulitis.  Aortic Atherosclerosis (ICD10-I70.0).   Electronically Signed By: Keith Rake M.D. On: 02/12/2020 19:14  CT HEAD WITHOUT CONTRAST  CT CERVICAL SPINE WITHOUT CONTRAST  TECHNIQUE: Multidetector CT imaging of the head and cervical spine was performed following the standard protocol without intravenous contrast. Multiplanar CT image reconstructions of the cervical spine were also generated.  COMPARISON: 04/07/2019  FINDINGS: CT HEAD FINDINGS  Brain: No evidence of acute infarction, hemorrhage, hydrocephalus, extra-axial collection or mass lesion/mass effect. Mild atrophic and chronic white matter ischemic changes are noted.  Vascular: No hyperdense vessel or unexpected calcification.  Skull: Normal. Negative for fracture or focal lesion.  Sinuses/Orbits: No acute finding.  Other: None.  CT CERVICAL SPINE FINDINGS  Alignment: Mild straightening of the normal cervical lordosis is noted.  Skull base and vertebrae: Mild degenerative anterolisthesis of C4 on C5 is noted.  Disc space narrowing is noted at C4-5, C5-6 and C6-7. Associated osteophytic changes are noted at these levels as well. Mild facet hypertrophic changes are noted without acute abnormality. No acute fracture or acute facet abnormality is seen.  Soft tissues and spinal canal: There is a hypodensity identified within the right lobe of the thyroid measuring 17 mm. This is stable in appearance from the prior exam.  Upper chest: Scarring is noted in the upper lung fields bilaterally. This is stable from the prior study.  Other: None  IMPRESSION: CT of the head: Chronic atrophic and ischemic changes without acute abnormality.  CT of the cervical spine: Degenerative change without acute abnormality.  Right thyroid nodule measuring 17 mm. No follow-up recommended unless clinically warranted (ref: J Am Coll Radiol. 2015 Feb;12(2): 143-50).   Electronically Signed By: Inez Catalina M.D. On: 02/12/2020 19:06  LEFT ANKLE COMPLETE - 3+ VIEW  COMPARISON: None available.  FINDINGS: No acute fracture dislocation. Ankle mortise approximated. Talar dome intact. Posterior plantar calcaneal enthesophytes noted. Mild osteopenia. Diffuse soft tissue swelling present about the ankle.  IMPRESSION: 1. No acute fracture or dislocation. 2. Diffuse soft tissue swelling about the ankle.   Electronically Signed By: Jeannine Boga M.D. On: 02/12/2020 19:59  LEFT SHOULDER - 2+ VIEW  COMPARISON: Prior radiograph from 08/27/2019.  FINDINGS: No acute fracture dislocation. Humeral head in normal alignment with the glenoid. AC joint approximated. Mild osteoarthritic changes present about the shoulder. No periarticular calcification. No visible soft tissue injury. Mild osteopenia.  IMPRESSION: No acute osseous abnormality about the left shoulder.   Electronically Signed By: Jeannine Boga M.D. On: 02/12/2020 20:01  LEFT LOWER EXTREMITY VENOUS  DOPPLER ULTRASOUND  TECHNIQUE: Gray-scale sonography with compression, as well as color and duplex ultrasound, were performed to evaluate the deep venous system(s) from the level of the common femoral vein through the popliteal and proximal calf veins.  COMPARISON: Venous ultrasound 02/07/2016  FINDINGS: VENOUS  Normal compressibility of the common femoral, superficial femoral, and popliteal veins, as well as the visualized calf veins. Visualized portions of profunda femoral vein and great saphenous vein unremarkable. No filling  defects to suggest DVT on grayscale or color Doppler imaging. Doppler waveforms show normal direction of venous flow, normal respiratory plasticity and response to augmentation.  Limited views of the contralateral common femoral vein are unremarkable.  OTHER  None.  Limitations: None  IMPRESSION: No evidence of left lower extremity DVT.   Electronically Signed By: Keith Rake M.D. On: 02/12/2020 20:25   Treatments: IV fluids and symptomatic treatment.   Discharge Exam: Blood pressure (!) 141/80, pulse 68, temperature 98.2 F (36.8 C), temperature source Oral, resp. rate 20, height 5\' 6"  (1.676 m), weight 81.2 kg, SpO2 97 %. General appearance: alert and cooperative Resp: clear to auscultation bilaterally Cardio: regular rate and rhythm, S1, S2 normal, no murmur, click, rub or gallop GI: soft, non-tender; bowel sounds normal; no masses,  no organomegaly Extremities: extremities normal, atraumatic, no cyanosis or edema  Disposition: Discharge disposition: 01-Home or Self Care       Discharge Instructions    Diet - low sodium heart healthy   Complete by: As directed    Diet - low sodium heart healthy   Complete by: As directed    Increase activity slowly   Complete by: As directed    Increase activity slowly   Complete by: As directed      Allergies as of 02/16/2020      Reactions    Sulfamethoxazole-trimethoprim Other (See Comments)   dizziness dizziness dizziness   Tape Rash   blisters      Medication List    TAKE these medications   acetaminophen 500 MG tablet Commonly known as: TYLENOL Take 1,000 mg by mouth every 6 (six) hours as needed.   atorvastatin 80 MG tablet Commonly known as: LIPITOR Take 80 mg by mouth at bedtime.   cholecalciferol 25 MCG (1000 UNIT) tablet Commonly known as: VITAMIN D3 Take 1,000 Units by mouth 2 (two) times daily.   clopidogrel 75 MG tablet Commonly known as: PLAVIX Take 75 mg by mouth daily.   furosemide 20 MG tablet Commonly known as: Lasix Take 1 tablet (20 mg total) by mouth every other day.   gabapentin 100 MG capsule Commonly known as: NEURONTIN Take 100 mg by mouth 2 (two) times daily.   HYDROcodone-acetaminophen 5-325 MG tablet Commonly known as: Norco Take 1 tablet by mouth every 6 (six) hours as needed for moderate pain.   insulin glargine 100 UNIT/ML injection Commonly known as: LANTUS Inject 38 Units into the skin at bedtime.   isosorbide mononitrate 120 MG 24 hr tablet Commonly known as: IMDUR Take 120 mg by mouth daily.   Melatonin 10 MG Caps Take 1 capsule by mouth at bedtime.   metFORMIN 500 MG tablet Commonly known as: GLUCOPHAGE Take 1,000 mg by mouth 2 (two) times daily.   metoprolol succinate 50 MG 24 hr tablet Commonly known as: TOPROL-XL at bedtime. TAKE 1 TABLET (50 MG TOTAL) BY MOUTH TAKE WITH 100 MG TABLET OF METOPROLOL (TOTAL DOSE 150MG )   metoprolol succinate 100 MG 24 hr tablet Commonly known as: TOPROL-XL at bedtime.   nitroGLYCERIN 0.4 MG SL tablet Commonly known as: NITROSTAT Place 0.4 mg under the tongue every 5 (five) minutes as needed for chest pain.   NovoLIN R 100 units/mL injection Generic drug: insulin regular Inject into the skin 3 (three) times daily before meals. Inject 0-0.025 mL (0-2.5 Units total) under the skin Three (3) times a day before meals.  Insulin Sliding Scale: Blood Glucose - 151-200 mg/dL = 0.5 units; 201-250 mg/dL = 1  unit; 251-300 mg/dL = 1.5 units; 301-350 mg/dL= 2 units; 351-400 mg/dL = 2.5 units 10 mL   omeprazole 40 MG capsule Commonly known as: PRILOSEC Take 40 mg by mouth 2 (two) times daily.   PRESERVISION AREDS 2+MULTI VIT PO Take by mouth.   sertraline 100 MG tablet Commonly known as: ZOLOFT Take 200 mg by mouth daily.   tamsulosin 0.4 MG Caps capsule Commonly known as: FLOMAX Take 0.8 mg by mouth at bedtime.       Follow-up Information    Danae Orleans, MD Follow up in 1 week(s).   Specialty: Internal Medicine Contact information: [address] [facility] [place] [id number] [phone]               Signed: Sharen Hones 02/16/2020, 8:51 AM

## 2020-02-16 NOTE — TOC Transition Note (Signed)
Transition of Care [facility]) - CM/SW Discharge Note   Patient Details  Name: Peter Robertson MRN: [id number] Date of Birth: 1941-01-16  [facility] CM/SW Contact:  Candie Chroman, LCSW Phone Number: 02/16/2020, 10:47 AM   Clinical Narrative:  Patient has orders to discharge home today. [facility] is able to accept him for PT, OT, SW and start of care will be Wednesday 9/1. Patient and neighbor are aware and agreeable. Patient's neighbor will pick him up around 2:00. RN aware. No further concerns. CSW signing off.   Final next level of care: [place] Barriers to Discharge: Barriers Resolved   Patient Goals and CMS Choice Patient states their goals for this hospitalization and ongoing recovery are:: "I would liek to get back home soon, and I want the swellin gin my legs to go away." CMS Medicare.gov Compare Post Acute Care list provided to:: Patient Choice offered to / list presented to : Patient  Discharge Placement                Patient to be transferred to facility by: [facility] will transport him home around 2:00 Name of family member notified: Lorinda Creed Patient and family notified of of transfer: 02/16/20  Discharge Plan and Services In-house Referral: Clinical Social Work                        HH Arranged: PT, OT, Social Work [employer] Agency: Microbiologist ([place]) Date [place]: 02/16/20   Representative spoke with at [place]: [place] ([place]) Interventions     Readmission Risk Interventions No flowsheet data found.

## 2020-02-16 NOTE — Care Management Important Message (Signed)
Important Message  Patient Details  Name: Peter Robertson MRN: [id number] Date of Birth: 04/20/1941   Medicare Important Message Given:  Yes     Dannette Barbara 02/16/2020, 1:24 PM

## 2020-03-02 IMAGING — CT CT CERVICAL SPINE W/O CM
3 of 7 series · 13 of 33 positions shown, 15 images · non-contrast
Comparison: CT 06/29/2017, MRI 06/29/2017, CT spine 02/03/2012

CLINICAL DATA: Fall

EXAM:
CT HEAD WITHOUT CONTRAST
CT CERVICAL SPINE WITHOUT CONTRAST
TECHNIQUE: Multidetector CT imaging of the head and cervical spine was
performed following the standard protocol without intravenous
contrast. Multiplanar CT image reconstructions of the cervical spine
were also generated.

[Series 5: coronal soft tissue · coronal · 0.33mm/px · 3 of 65 slices shown]
[im 17/65  bone]
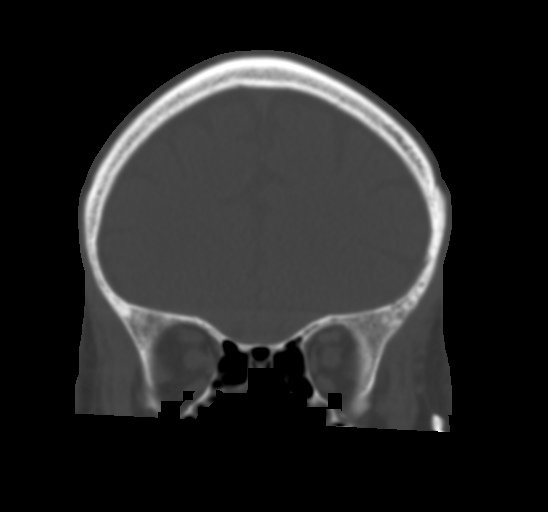
[im 33/65  bone]
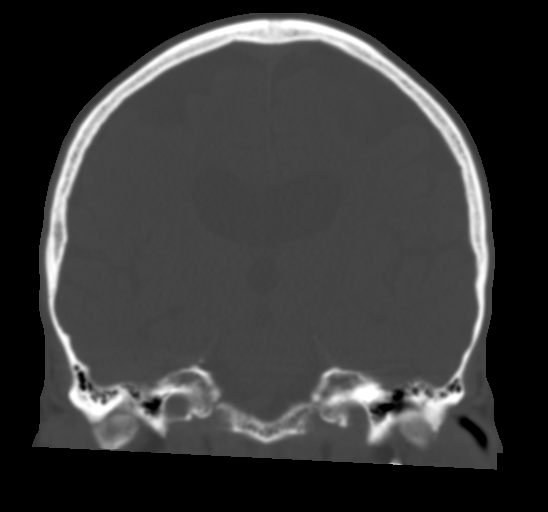
[im 49/65  bone]
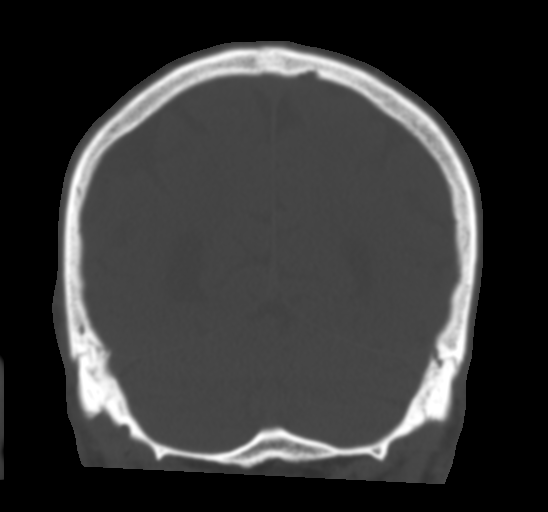

[Series 8: sagittal bone · sagittal · 0.25mm/px · 5 of 53 slices shown]
[im 9/53  bone]
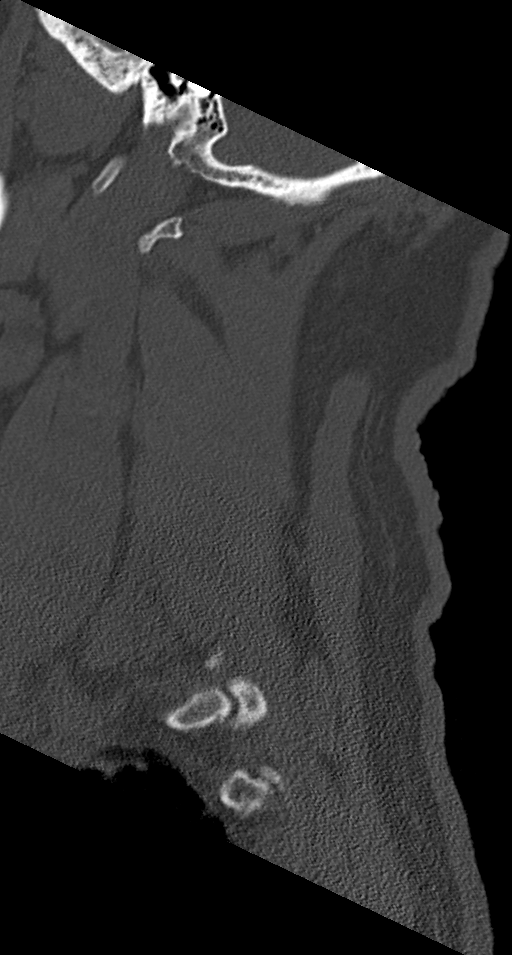
[im 18/53  bone]
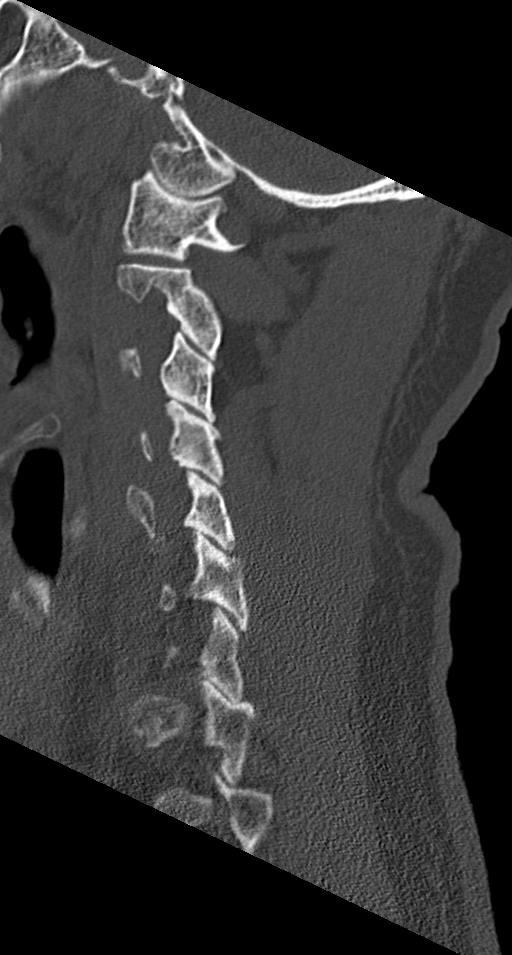
[im 27/53  bone]
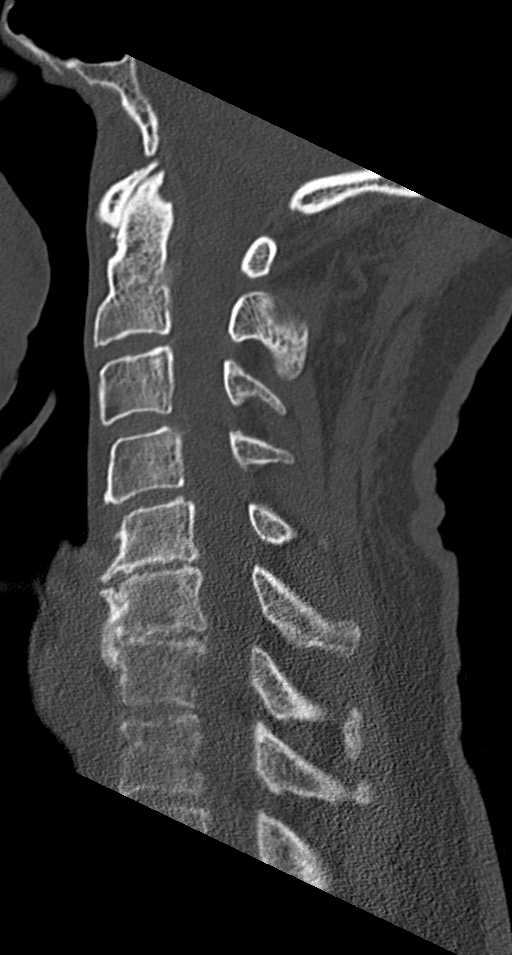
[im 35/53  bone]
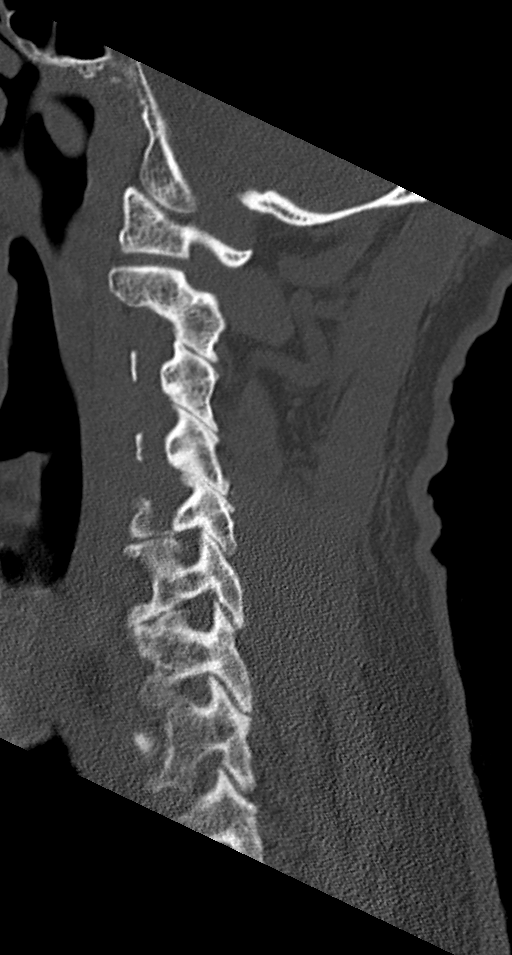
[im 44/53  bone]
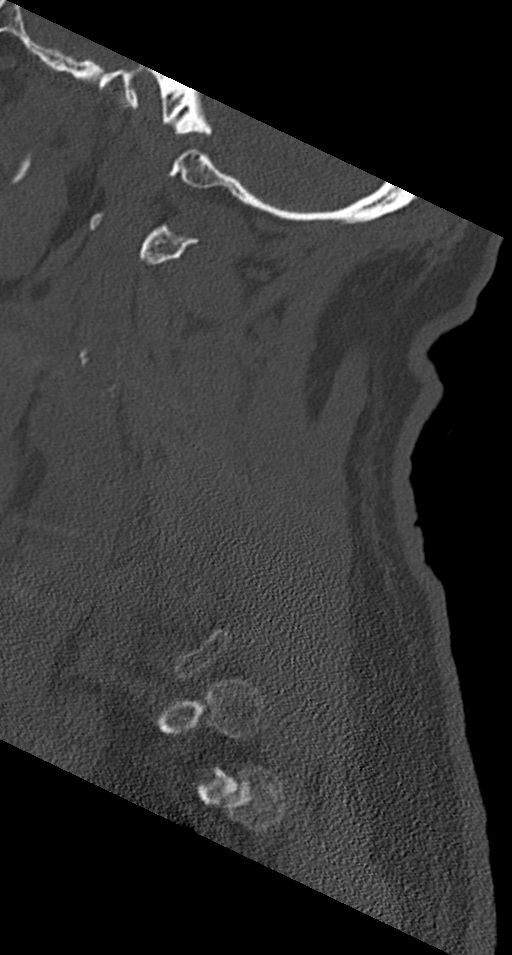

[Series 10: orthogonal bone · axial · 0.25mm/px · z∈[+218,+346]mm · 5 of 107 slices shown, 7 images]
[im 18/107  soft-tissue]
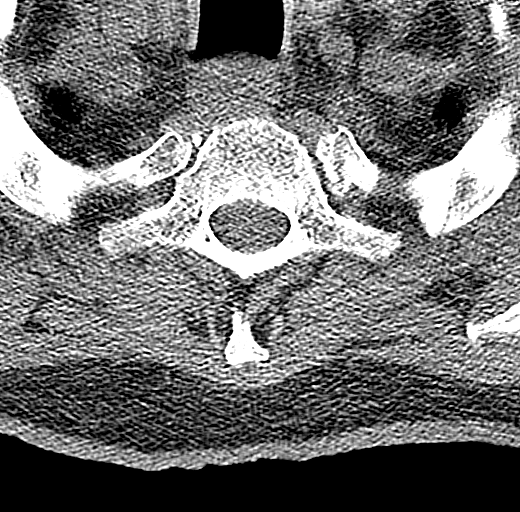
[im 18/107  bone]
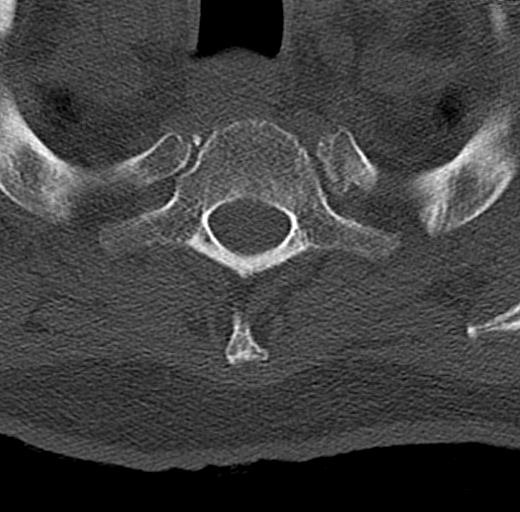
[im 36/107  bone]
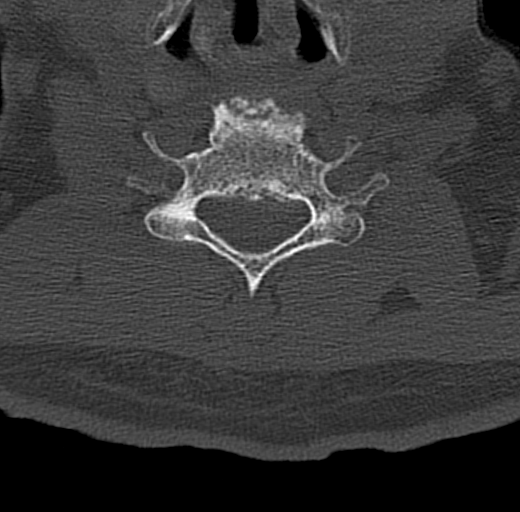
[im 54/107  bone]
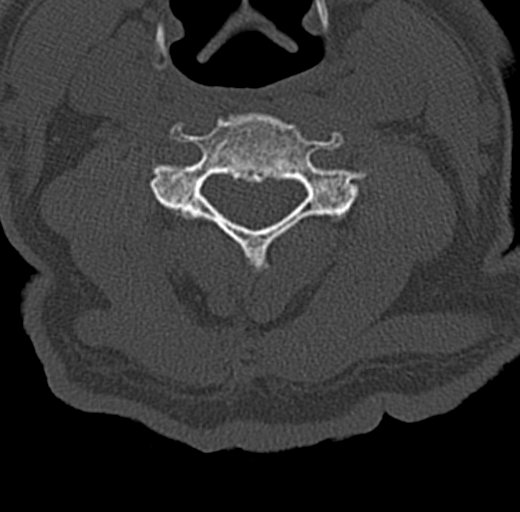
[im 71/107  bone]
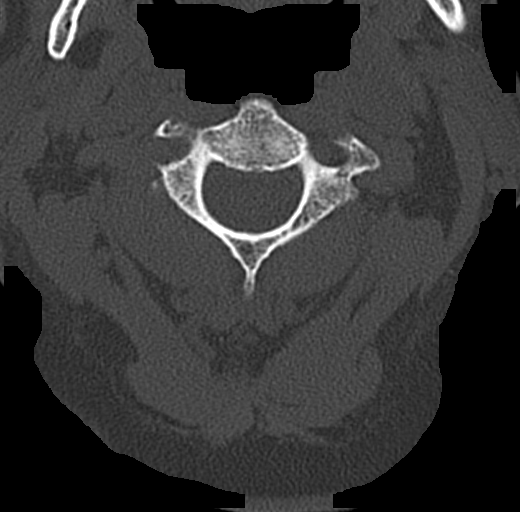
[im 89/107  soft-tissue]
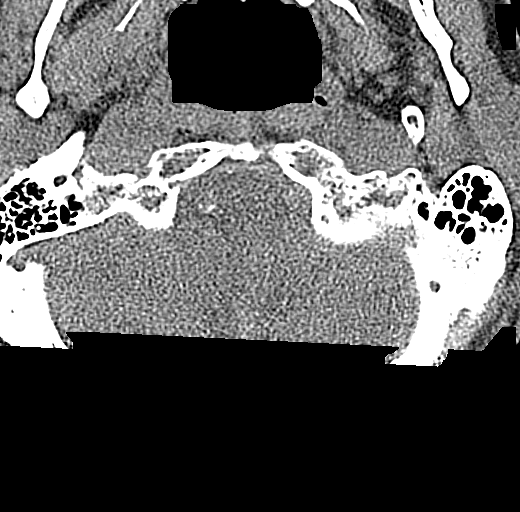
[im 89/107  bone]
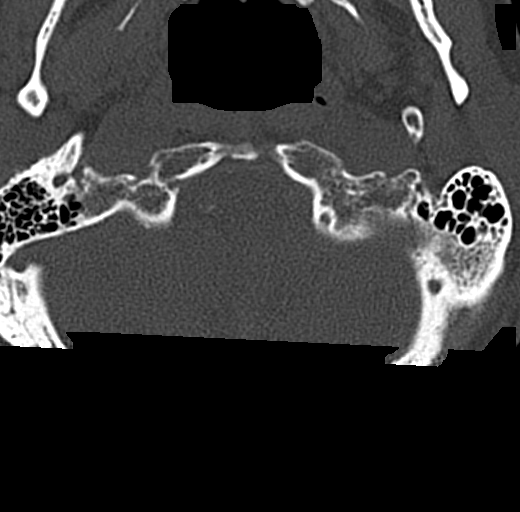

[13 of 33 positions shown; findings below may reference images not displayed]

FINDINGS: CT HEAD FINDINGS

Brain: No acute territorial infarction, hemorrhage or intracranial
mass is visualized. Mild small vessel ischemic changes of the white
matter. Mild to moderate atrophy. Stable ventricle size.

Vascular: No hyperdense vessels.  Carotid vascular calcification

Skull: No fracture

Sinuses/Orbits: Mucosal thickening in the maxillary and ethmoid
sinuses. No acute orbital abnormality

Other: Mild posterior scalp soft tissue swelling

CT CERVICAL SPINE FINDINGS

Alignment: Straightening of the cervical spine. No subluxation.
Facet alignment within normal limits.

Skull base and vertebrae: No acute fracture. No primary bone lesion
or focal pathologic process.

Soft tissues and spinal canal: No prevertebral fluid or swelling. No
visible canal hematoma.

Disc levels: Moderate-to-marked degenerative changes C5-C6 and
C6-C7. Mild degenerative changes at C4-C5.

Upper chest: Scarring and mild emphysematous disease at the apices.

Other: None
IMPRESSION: 1. No CT evidence for acute intracranial abnormality. Atrophy and
minimal small vessel ischemic changes of the white matter
2. Straightening of the cervical spine with degenerative changes. No
definite acute osseous abnormality.

## 2020-08-30 ENCOUNTER — Emergency Department
Admission: EM | Admit: 2020-08-30 | Discharge: 2020-08-30 | Disposition: A | Discharge status: Home / Self Care | Payer: Medicare Other | Attending: Student in an Organized Health Care Education/Training Program | Admitting: Student in an Organized Health Care Education/Training Program

## 2020-08-30 ENCOUNTER — Emergency Department: Payer: Medicare Other

## 2020-08-30 ENCOUNTER — Other Ambulatory Visit: Payer: Self-pay

## 2020-08-30 DIAGNOSIS — Z951 Presence of aortocoronary bypass graft: Secondary | ICD-10-CM | POA: Diagnosis not present

## 2020-08-30 DIAGNOSIS — E1122 Type 2 diabetes mellitus with diabetic chronic kidney disease: Secondary | ICD-10-CM | POA: Insufficient documentation

## 2020-08-30 DIAGNOSIS — Z7984 Long term (current) use of oral hypoglycemic drugs: Secondary | ICD-10-CM | POA: Diagnosis not present

## 2020-08-30 DIAGNOSIS — Z87891 Personal history of nicotine dependence: Secondary | ICD-10-CM | POA: Insufficient documentation

## 2020-08-30 DIAGNOSIS — I2510 Atherosclerotic heart disease of native coronary artery without angina pectoris: Secondary | ICD-10-CM | POA: Diagnosis not present

## 2020-08-30 DIAGNOSIS — Z794 Long term (current) use of insulin: Secondary | ICD-10-CM | POA: Diagnosis not present

## 2020-08-30 DIAGNOSIS — S0990XA Unspecified injury of head, initial encounter: Secondary | ICD-10-CM | POA: Insufficient documentation

## 2020-08-30 DIAGNOSIS — I129 Hypertensive chronic kidney disease with stage 1 through stage 4 chronic kidney disease, or unspecified chronic kidney disease: Secondary | ICD-10-CM | POA: Diagnosis not present

## 2020-08-30 DIAGNOSIS — N189 Chronic kidney disease, unspecified: Secondary | ICD-10-CM | POA: Diagnosis not present

## 2020-08-30 DIAGNOSIS — Z7902 Long term (current) use of antithrombotics/antiplatelets: Secondary | ICD-10-CM | POA: Insufficient documentation

## 2020-08-30 DIAGNOSIS — W01198A Fall on same level from slipping, tripping and stumbling with subsequent striking against other object, initial encounter: Secondary | ICD-10-CM | POA: Insufficient documentation

## 2020-08-30 DIAGNOSIS — S8992XA Unspecified injury of left lower leg, initial encounter: Secondary | ICD-10-CM | POA: Insufficient documentation

## 2020-08-30 DIAGNOSIS — Z79899 Other long term (current) drug therapy: Secondary | ICD-10-CM | POA: Insufficient documentation

## 2020-08-30 DIAGNOSIS — J45909 Unspecified asthma, uncomplicated: Secondary | ICD-10-CM | POA: Diagnosis not present

## 2020-08-30 DIAGNOSIS — W19XXXA Unspecified fall, initial encounter: Secondary | ICD-10-CM

## 2020-08-30 LAB — CBC WITH DIFFERENTIAL/PLATELET
Abs Immature Granulocytes: 0.04 10*3/uL (ref 0.00–0.07)
Basophils Absolute: 0 10*3/uL (ref 0.0–0.1)
Basophils Relative: 0 %
Eosinophils Absolute: 0.1 10*3/uL (ref 0.0–0.5)
Eosinophils Relative: 2 %
HCT: 33.8 % — ABNORMAL LOW (ref 39.0–52.0)
Hemoglobin: 11.7 g/dL — ABNORMAL LOW (ref 13.0–17.0)
Immature Granulocytes: 1 %
Lymphocytes Relative: 13 %
Lymphs Abs: 0.8 10*3/uL (ref 0.7–4.0)
MCH: 28.4 pg (ref 26.0–34.0)
MCHC: 34.6 g/dL (ref 30.0–36.0)
MCV: 82 fL (ref 80.0–100.0)
Monocytes Absolute: 0.6 10*3/uL (ref 0.1–1.0)
Monocytes Relative: 10 %
Neutro Abs: 4.5 10*3/uL (ref 1.7–7.7)
Neutrophils Relative %: 74 %
Platelets: 104 10*3/uL — ABNORMAL LOW (ref 150–400)
RBC: 4.12 MIL/uL — ABNORMAL LOW (ref 4.22–5.81)
RDW: 15 % (ref 11.5–15.5)
Smear Review: NORMAL
WBC: 6 10*3/uL (ref 4.0–10.5)
nRBC: 0 % (ref 0.0–0.2)

## 2020-08-30 LAB — COMPREHENSIVE METABOLIC PANEL
ALT: 12 U/L (ref 0–44)
AST: 19 U/L (ref 15–41)
Albumin: 4.2 g/dL (ref 3.5–5.0)
Alkaline Phosphatase: 64 U/L (ref 38–126)
Anion gap: 9 (ref 5–15)
BUN: 41 mg/dL — ABNORMAL HIGH (ref 8–23)
CO2: 19 mmol/L — ABNORMAL LOW (ref 22–32)
Calcium: 9 mg/dL (ref 8.9–10.3)
Chloride: 106 mmol/L (ref 98–111)
Creatinine, Ser: 1.56 mg/dL — ABNORMAL HIGH (ref 0.61–1.24)
GFR, Estimated: 45 mL/min — ABNORMAL LOW (ref >60)
Glucose, Bld: 299 mg/dL — ABNORMAL HIGH (ref 70–99)
Potassium: 4.4 mmol/L (ref 3.5–5.1)
Sodium: 134 mmol/L — ABNORMAL LOW (ref 135–145)
Total Bilirubin: 0.8 mg/dL (ref 0.3–1.2)
Total Protein: 6.8 g/dL (ref 6.5–8.1)

## 2020-08-30 LAB — URINALYSIS, COMPLETE (UACMP) WITH MICROSCOPIC
Bacteria, UA: NONE SEEN
Bilirubin Urine: NEGATIVE
Glucose, UA: 150 mg/dL — AB
Hgb urine dipstick: NEGATIVE
Ketones, ur: NEGATIVE mg/dL
Leukocytes,Ua: NEGATIVE
Nitrite: NEGATIVE
Protein, ur: 100 mg/dL — AB
Specific Gravity, Urine: 1.017 (ref 1.005–1.030)
Squamous Epithelial / HPF: NONE SEEN (ref 0–5)
pH: 5 (ref 5.0–8.0)

## 2020-08-30 MED ORDER — SODIUM CHLORIDE 0.9 % IV BOLUS: 500.0000 mL | Freq: Once | INTRAVENOUS | Status: DC

## 2020-08-30 MED ORDER — INSULIN ASPART 100 UNIT/ML ~~LOC~~ SOLN
6.0000 [IU] | Freq: Once | SUBCUTANEOUS | Status: AC
  Administered 2020-08-30: 6 [IU] via SUBCUTANEOUS
  Filled 2020-08-30: qty 1

## 2020-08-30 MED ORDER — ACETAMINOPHEN 325 MG PO TABS
650.0000 mg | ORAL_TABLET | Freq: Once | ORAL | Status: AC
  Administered 2020-08-30: 650 mg via ORAL
  Filled 2020-08-30: qty 2

## 2020-08-30 NOTE — ED Triage Notes (Signed)
Pt states his left knee gave out and he lost his balance falling and hitting the right side of his head on the coffee table, no bruising or swelling noted at this time, denies LOC, pt c/o HA, neck pain and abrasion to the right hand

## 2020-08-30 NOTE — ED Provider Notes (Signed)
[facility] Emergency Department Provider Note    Event Date/Time   First MD Initiated Contact with Patient 08/30/20 1835     (approximate)  I have reviewed the triage vital signs and the nursing notes.   HISTORY  Chief Complaint Fall    HPI Peter Robertson is a 80 y.o. male below listed past medical history presents to the ER for evaluation of fall where his left leg gave out he fell striking his head few days back.  States he woke up with headache today.  Denies any numbness or tingling.  No nausea or vomiting.  Not had much to eat today.  Denies any new weakness numbness or tingling.  Denies any abdominal pain at this time.  He does typically walk with a walker.  Lives at home alone but does have neighbor and friend come check on him frequently.  Denies any dysrhythmia or chest pain.  Denies any presyncopal symptoms states it was a mechanical fall.    Past Medical History:  Diagnosis Date  . Asthma   . BPH (benign prostatic hyperplasia)   . Chronic kidney disease    Kidney Stones  . Coronary artery disease   . Diabetes mellitus without complication ([place])   . Heart murmur   . Hyperlipidemia   . Hypertension   . Myocardial infarction [facility])    Family History  Problem Relation Age of Onset  . Diabetes Mother   . Cancer Mother   . Hypertension Father   . Hypertension Brother   . Diabetes Brother    Past Surgical History:  Procedure Laterality Date  . COLONOSCOPY WITH PROPOFOL N/A 12/03/2014   Procedure: COLONOSCOPY WITH PROPOFOL;  Surgeon: Manya Silvas, MD;  Location: [facility] ENDOSCOPY;  Service: Endoscopy;  Laterality: N/A;  . CORONARY ARTERY BYPASS GRAFT    . LEFT HEART CATH AND CORS/GRAFTS ANGIOGRAPHY N/A 05/22/2018   Procedure: LEFT HEART CATH AND CORS/GRAFTS ANGIOGRAPHY poss PCI;  Surgeon: Minna Merritts, MD;  Location: [place] [facility] LAB;  Service: Cardiovascular;  Laterality: N/A;   Patient Active Problem List   Diagnosis Date Noted   . Encounter for psychological evaluation 02/15/2020  . AKI (acute kidney injury) ([place]) 02/12/2020  . Vertigo 10/11/2019  . BPPV (benign paroxysmal positional vertigo) 10/10/2019  . CAD (coronary artery disease) 10/10/2019  . Diabetes mellitus ([place]) 10/10/2019  . BPH (benign prostatic hyperplasia) 10/10/2019  . Thrombocytopenia ([place]) 10/10/2019  . Dark stools 10/10/2019  . Unstable angina ([place]) 05/20/2018  . Chest pain 01/30/2017      Prior to Admission medications   Medication Sig Start Date End Date Taking? Authorizing Provider  acetaminophen (TYLENOL) 500 MG tablet Take 1,000 mg by mouth every 6 (six) hours as needed.    [provider]  atorvastatin (LIPITOR) 80 MG tablet Take 80 mg by mouth at bedtime.    [provider]  cholecalciferol (VITAMIN D3) 25 MCG (1000 UT) tablet Take 1,000 Units by mouth 2 (two) times daily.    [provider]  clopidogrel (PLAVIX) 75 MG tablet Take 75 mg by mouth daily. 08/15/19   [provider]  furosemide (LASIX) 20 MG tablet Take 1 tablet (20 mg total) by mouth every other day. 10/17/19   Samuella Cota, MD  gabapentin (NEURONTIN) 100 MG capsule Take 100 mg by mouth 2 (two) times daily.  01/23/20   [provider]  HYDROcodone-acetaminophen (NORCO) 5-325 MG tablet Take 1 tablet by mouth every 6 (six) hours as  needed for moderate pain. 01/17/20   Paulette Blanch, MD  insulin glargine (LANTUS) 100 UNIT/ML injection Inject 38 Units into the skin at bedtime.     [provider]  isosorbide mononitrate (IMDUR) 120 MG 24 hr tablet Take 120 mg by mouth daily.    [provider]  Melatonin 10 MG CAPS Take 1 capsule by mouth at bedtime.     [provider]  metFORMIN (GLUCOPHAGE) 500 MG tablet Take 1,000 mg by mouth 2 (two) times daily. 09/22/19   [provider]  metoprolol succinate (TOPROL-XL) 100 MG 24 hr tablet at bedtime.    [provider]  metoprolol succinate  (TOPROL-XL) 50 MG 24 hr tablet at bedtime. TAKE 1 TABLET (50 MG TOTAL) BY MOUTH TAKE WITH 100 MG TABLET OF METOPROLOL (TOTAL DOSE 150MG )    [provider]  Multiple Vitamins-Minerals (PRESERVISION AREDS 2+MULTI VIT PO) Take by mouth.    [provider]  nitroGLYCERIN (NITROSTAT) 0.4 MG SL tablet Place 0.4 mg under the tongue every 5 (five) minutes as needed for chest pain.     [provider]  NOVOLIN R 100 UNIT/ML injection Inject into the skin 3 (three) times daily before meals. Inject 0-0.025 mL (0-2.5 Units total) under the skin Three (3) times a day before meals. Insulin Sliding Scale: Blood Glucose - 151-200 mg/dL = 0.5 units; 201-250 mg/dL = 1 unit; 251-300 mg/dL = 1.5 units; 301-350 mg/dL= 2 units; 351-400 mg/dL = 2.5 units 10 mL 01/27/20   [provider]  omeprazole (PRILOSEC) 40 MG capsule Take 40 mg by mouth 2 (two) times daily. 07/19/19   [provider]  sertraline (ZOLOFT) 100 MG tablet Take 200 mg by mouth daily.     [provider]  tamsulosin (FLOMAX) 0.4 MG CAPS capsule Take 0.8 mg by mouth at bedtime.     [provider]    Allergies Sulfamethoxazole-trimethoprim and Tape    Social History Social History   Tobacco Use  . Smoking status: Former Smoker    Quit date: 06/19/1984    Years since quitting: 36.2  . Smokeless tobacco: Never Used  Vaping Use  . Vaping Use: Never used  Substance Use Topics  . Alcohol use: No  . Drug use: No    Review of Systems Patient denies headaches, rhinorrhea, blurry vision, numbness, shortness of breath, chest pain, edema, cough, abdominal pain, nausea, vomiting, diarrhea, dysuria, fevers, rashes or hallucinations unless otherwise stated above in HPI. ____________________________________________   PHYSICAL EXAM:  VITAL SIGNS: Vitals:   08/30/20 1825  BP: 119/70  Pulse: 65  Resp: 17  Temp: 98.3 F (36.8 C)  SpO2: 98%    Constitutional: Alert and oriented.  Eyes:  Conjunctivae are normal.  Head: small contusion to right temporal area, no swelling, no mass, no laceration Nose: No congestion/rhinnorhea. Mouth/Throat: Mucous membranes are moist.   Neck: No stridor. Painless ROM.  Cardiovascular: Normal rate, regular rhythm. Grossly normal heart sounds.  Good peripheral circulation. Respiratory: Normal respiratory effort.  No retractions. Lungs CTAB. Gastrointestinal: Soft and nontender. No distention. No abdominal bruits. No CVA tenderness. Genitourinary: deferred Musculoskeletal: No lower extremity tenderness nor edema.  No joint effusions. Neurologic:  Normal speech and language. No gross focal neurologic deficits are appreciated. No facial droop Skin:  Skin is warm, dry and intact. No rash noted. Psychiatric: Mood and affect are normal. Speech and behavior are normal.  ____________________________________________   LABS (all labs ordered are listed, but only abnormal results are  displayed)  Results for orders placed or performed during the hospital encounter of 08/30/20 (from the past 24 hour(s))  CBC with Differential     Status: Abnormal   Collection Time: 08/30/20  6:46 PM  Result Value Ref Range   WBC 6.0 4.0 - 10.5 K/uL   RBC 4.12 (L) 4.22 - 5.81 MIL/uL   Hemoglobin 11.7 (L) 13.0 - 17.0 g/dL   HCT 33.8 (L) 39.0 - 52.0 %   MCV 82.0 80.0 - 100.0 fL   MCH 28.4 26.0 - 34.0 pg   MCHC 34.6 30.0 - 36.0 g/dL   RDW 15.0 11.5 - 15.5 %   Platelets 104 (L) 150 - 400 K/uL   nRBC 0.0 0.0 - 0.2 %   Neutrophils Relative % 74 %   Neutro Abs 4.5 1.7 - 7.7 K/uL   Lymphocytes Relative 13 %   Lymphs Abs 0.8 0.7 - 4.0 K/uL   Monocytes Relative 10 %   Monocytes Absolute 0.6 0.1 - 1.0 K/uL   Eosinophils Relative 2 %   Eosinophils Absolute 0.1 0.0 - 0.5 K/uL   Basophils Relative 0 %   Basophils Absolute 0.0 0.0 - 0.1 K/uL   WBC Morphology MORPHOLOGY UNREMARKABLE    RBC Morphology MORPHOLOGY UNREMARKABLE    Smear Review Normal platelet morphology     Immature Granulocytes 1 %   Abs Immature Granulocytes 0.04 0.00 - 0.07 K/uL  Comprehensive metabolic panel     Status: Abnormal   Collection Time: 08/30/20  6:46 PM  Result Value Ref Range   Sodium 134 (L) 135 - 145 mmol/L   Potassium 4.4 3.5 - 5.1 mmol/L   Chloride 106 98 - 111 mmol/L   CO2 19 (L) 22 - 32 mmol/L   Glucose, Bld 299 (H) 70 - 99 mg/dL   BUN 41 (H) 8 - 23 mg/dL   Creatinine, Ser 1.56 (H) 0.61 - 1.24 mg/dL   Calcium 9.0 8.9 - 10.3 mg/dL   Total Protein 6.8 6.5 - 8.1 g/dL   Albumin 4.2 3.5 - 5.0 g/dL   AST 19 15 - 41 U/L   ALT 12 0 - 44 U/L   Alkaline Phosphatase 64 38 - 126 U/L   Total Bilirubin 0.8 0.3 - 1.2 mg/dL   GFR, Estimated 45 (L) >60 mL/min   Anion gap 9 5 - 15  Urinalysis, Complete w Microscopic Urine, Clean Catch     Status: Abnormal   Collection Time: 08/30/20  7:29 PM  Result Value Ref Range   Color, Urine YELLOW (A) YELLOW   APPearance CLEAR (A) CLEAR   Specific Gravity, Urine 1.017 1.005 - 1.030   pH 5.0 5.0 - 8.0   Glucose, UA 150 (A) NEGATIVE mg/dL   Hgb urine dipstick NEGATIVE NEGATIVE   Bilirubin Urine NEGATIVE NEGATIVE   Ketones, ur NEGATIVE NEGATIVE mg/dL   Protein, ur 100 (A) NEGATIVE mg/dL   Nitrite NEGATIVE NEGATIVE   Leukocytes,Ua NEGATIVE NEGATIVE   RBC / HPF 0-5 0 - 5 RBC/hpf   WBC, UA 0-5 0 - 5 WBC/hpf   Bacteria, UA NONE SEEN NONE SEEN   Squamous Epithelial / LPF NONE SEEN 0 - 5   Mucus PRESENT    Hyaline Casts, UA PRESENT    ____________________________________________  EKG____________________________________________  RADIOLOGY  I personally reviewed all radiographic images ordered to evaluate for the above acute complaints and reviewed radiology reports and findings.  These findings were personally discussed with the patient.  Please see medical record for radiology report.  ____________________________________________   PROCEDURES  Procedure(s) performed:  Procedures    Critical Care performed:  no ____________________________________________   INITIAL IMPRESSION / ASSESSMENT AND PLAN / ED COURSE  Pertinent labs & imaging results that were available during my care of the patient were reviewed by me and considered in my medical decision making (see chart for details).   DDX: Contusion, SDH, IPH, migraine, fracture, dislocation, electrolyte abnormality  Peter Robertson is a 80 y.o. who presents to the ED with presentation as described above.  Patient well-appearing in no acute distress.  No focal deficits at this time appears to be at his baseline.  Given his age and risk factors CT imaging ordered for by differential shows no evidence of acute intracranial abnormality.  X-rays ordered as he was also complaining of some left leg pain and injured his left knee.  States he has some chronic swelling to that left leg but is unchanged.  No recent fevers.  States he has been going to urinate more frequently.  Will check urinalysis and basic blood work.  Clinical Course as of 08/30/20 2009  Mon Aug 30, 2020  [id number] Patient reassessed.  Feels improved.  Friend and caretaker at bedside now states the primary concern was the fall with a headache if he is on Plavix.  No other concerns related.  Reportedly did not take his insulin this morning.  He is tolerating p.o.  Will give dose of insulin while here is not showing signs of DKA with normal gap.  Abdominal exam soft and benign.  He is able to ambulate.  Typically uses a walker for ambulation at home.  At this point I do believe the patient is stable and appropriate for outpatient follow-up. [PR]    Clinical Course User Index [PR] Merlyn Lot, MD    The patient was evaluated in Emergency Department today for the symptoms described in the history of present illness. He/she was evaluated in the context of the global COVID-19 pandemic, which necessitated consideration that the patient might be at risk for infection with the SARS-CoV-2 virus that  causes COVID-19. Institutional protocols and algorithms that pertain to the evaluation of patients at risk for COVID-19 are in a state of rapid change based on information released by regulatory bodies including the CDC and federal and state organizations. These policies and algorithms were followed during the patient's care in the ED.  As part of my medical decision making, I reviewed the following data within the [place] notes reviewed and incorporated, Labs reviewed, notes from prior ED visits and [place] Controlled Substance Database   ____________________________________________   FINAL CLINICAL IMPRESSION(S) / ED DIAGNOSES  Final diagnoses:  Fall, initial encounter  Minor head injury, initial encounter      NEW MEDICATIONS STARTED DURING THIS VISIT:  New Prescriptions   No medications on file     Note:  This document was prepared using Dragon voice recognition software and may include unintentional dictation errors.    Merlyn Lot, MD 08/30/20 2009

## 2020-08-30 NOTE — ED Notes (Signed)
Pt verbalizes understanding of d/c instructions.

## 2020-08-30 NOTE — ED Notes (Signed)
Pt unable to urinate at this time, will alert RN when able.

## 2021-05-05 ENCOUNTER — Emergency Department: Payer: Medicare Other

## 2021-05-05 ENCOUNTER — Inpatient Hospital Stay
Admission: EM | Admit: 2021-05-05 | Discharge: 2021-05-23 | DRG: 177 | Disposition: A | Discharge status: Skilled Nursing Facility | Payer: Medicare Other | Attending: Internal Medicine | Admitting: Internal Medicine

## 2021-05-05 ENCOUNTER — Other Ambulatory Visit: Payer: Self-pay

## 2021-05-05 DIAGNOSIS — E1122 Type 2 diabetes mellitus with diabetic chronic kidney disease: Secondary | ICD-10-CM | POA: Diagnosis present

## 2021-05-05 DIAGNOSIS — D869 Sarcoidosis, unspecified: Secondary | ICD-10-CM | POA: Diagnosis present

## 2021-05-05 DIAGNOSIS — J690 Pneumonitis due to inhalation of food and vomit: Secondary | ICD-10-CM

## 2021-05-05 DIAGNOSIS — Z87891 Personal history of nicotine dependence: Secondary | ICD-10-CM

## 2021-05-05 DIAGNOSIS — Z79899 Other long term (current) drug therapy: Secondary | ICD-10-CM

## 2021-05-05 DIAGNOSIS — I10 Essential (primary) hypertension: Secondary | ICD-10-CM | POA: Diagnosis present

## 2021-05-05 DIAGNOSIS — E785 Hyperlipidemia, unspecified: Secondary | ICD-10-CM | POA: Diagnosis present

## 2021-05-05 DIAGNOSIS — U071 COVID-19: Secondary | ICD-10-CM | POA: Diagnosis not present

## 2021-05-05 DIAGNOSIS — Z7902 Long term (current) use of antithrombotics/antiplatelets: Secondary | ICD-10-CM

## 2021-05-05 DIAGNOSIS — Z8249 Family history of ischemic heart disease and other diseases of the circulatory system: Secondary | ICD-10-CM

## 2021-05-05 DIAGNOSIS — N1831 Chronic kidney disease, stage 3a: Secondary | ICD-10-CM | POA: Diagnosis present

## 2021-05-05 DIAGNOSIS — K219 Gastro-esophageal reflux disease without esophagitis: Secondary | ICD-10-CM | POA: Diagnosis present

## 2021-05-05 DIAGNOSIS — I959 Hypotension, unspecified: Secondary | ICD-10-CM | POA: Diagnosis not present

## 2021-05-05 DIAGNOSIS — A419 Sepsis, unspecified organism: Secondary | ICD-10-CM | POA: Diagnosis present

## 2021-05-05 DIAGNOSIS — Z7984 Long term (current) use of oral hypoglycemic drugs: Secondary | ICD-10-CM

## 2021-05-05 DIAGNOSIS — J9601 Acute respiratory failure with hypoxia: Secondary | ICD-10-CM | POA: Diagnosis present

## 2021-05-05 DIAGNOSIS — Z881 Allergy status to other antibiotic agents status: Secondary | ICD-10-CM

## 2021-05-05 DIAGNOSIS — I2510 Atherosclerotic heart disease of native coronary artery without angina pectoris: Secondary | ICD-10-CM | POA: Diagnosis present

## 2021-05-05 DIAGNOSIS — R0902 Hypoxemia: Secondary | ICD-10-CM

## 2021-05-05 DIAGNOSIS — R7989 Other specified abnormal findings of blood chemistry: Secondary | ICD-10-CM | POA: Diagnosis present

## 2021-05-05 DIAGNOSIS — F32A Depression, unspecified: Secondary | ICD-10-CM | POA: Diagnosis present

## 2021-05-05 DIAGNOSIS — N183 Chronic kidney disease, stage 3 unspecified: Secondary | ICD-10-CM | POA: Diagnosis present

## 2021-05-05 DIAGNOSIS — E1165 Type 2 diabetes mellitus with hyperglycemia: Secondary | ICD-10-CM | POA: Diagnosis present

## 2021-05-05 DIAGNOSIS — Z9109 Other allergy status, other than to drugs and biological substances: Secondary | ICD-10-CM

## 2021-05-05 DIAGNOSIS — I5033 Acute on chronic diastolic (congestive) heart failure: Secondary | ICD-10-CM | POA: Diagnosis present

## 2021-05-05 DIAGNOSIS — N179 Acute kidney failure, unspecified: Secondary | ICD-10-CM | POA: Diagnosis present

## 2021-05-05 DIAGNOSIS — E041 Nontoxic single thyroid nodule: Secondary | ICD-10-CM

## 2021-05-05 DIAGNOSIS — D696 Thrombocytopenia, unspecified: Secondary | ICD-10-CM | POA: Diagnosis present

## 2021-05-05 DIAGNOSIS — J069 Acute upper respiratory infection, unspecified: Secondary | ICD-10-CM | POA: Diagnosis present

## 2021-05-05 DIAGNOSIS — F0390 Unspecified dementia without behavioral disturbance: Secondary | ICD-10-CM | POA: Diagnosis present

## 2021-05-05 DIAGNOSIS — Z794 Long term (current) use of insulin: Secondary | ICD-10-CM

## 2021-05-05 DIAGNOSIS — Z87442 Personal history of urinary calculi: Secondary | ICD-10-CM

## 2021-05-05 DIAGNOSIS — R011 Cardiac murmur, unspecified: Secondary | ICD-10-CM | POA: Diagnosis present

## 2021-05-05 DIAGNOSIS — I5032 Chronic diastolic (congestive) heart failure: Secondary | ICD-10-CM | POA: Diagnosis present

## 2021-05-05 DIAGNOSIS — J1282 Pneumonia due to coronavirus disease 2019: Secondary | ICD-10-CM

## 2021-05-05 DIAGNOSIS — N400 Enlarged prostate without lower urinary tract symptoms: Secondary | ICD-10-CM | POA: Diagnosis present

## 2021-05-05 DIAGNOSIS — S0990XA Unspecified injury of head, initial encounter: Secondary | ICD-10-CM

## 2021-05-05 DIAGNOSIS — Y92009 Unspecified place in unspecified non-institutional (private) residence as the place of occurrence of the external cause: Secondary | ICD-10-CM

## 2021-05-05 DIAGNOSIS — W1839XA Other fall on same level, initial encounter: Secondary | ICD-10-CM | POA: Diagnosis present

## 2021-05-05 DIAGNOSIS — J45909 Unspecified asthma, uncomplicated: Secondary | ICD-10-CM | POA: Diagnosis present

## 2021-05-05 DIAGNOSIS — R778 Other specified abnormalities of plasma proteins: Secondary | ICD-10-CM | POA: Diagnosis present

## 2021-05-05 DIAGNOSIS — N1832 Chronic kidney disease, stage 3b: Secondary | ICD-10-CM | POA: Diagnosis present

## 2021-05-05 DIAGNOSIS — R0602 Shortness of breath: Secondary | ICD-10-CM | POA: Diagnosis not present

## 2021-05-05 DIAGNOSIS — F0393 Unspecified dementia, unspecified severity, with mood disturbance: Secondary | ICD-10-CM | POA: Diagnosis present

## 2021-05-05 DIAGNOSIS — I252 Old myocardial infarction: Secondary | ICD-10-CM

## 2021-05-05 DIAGNOSIS — I130 Hypertensive heart and chronic kidney disease with heart failure and stage 1 through stage 4 chronic kidney disease, or unspecified chronic kidney disease: Secondary | ICD-10-CM | POA: Diagnosis present

## 2021-05-05 DIAGNOSIS — E1129 Type 2 diabetes mellitus with other diabetic kidney complication: Secondary | ICD-10-CM | POA: Diagnosis present

## 2021-05-05 DIAGNOSIS — W19XXXA Unspecified fall, initial encounter: Secondary | ICD-10-CM

## 2021-05-05 DIAGNOSIS — R531 Weakness: Secondary | ICD-10-CM

## 2021-05-05 DIAGNOSIS — Z833 Family history of diabetes mellitus: Secondary | ICD-10-CM

## 2021-05-05 DIAGNOSIS — T380X5A Adverse effect of glucocorticoids and synthetic analogues, initial encounter: Secondary | ICD-10-CM | POA: Diagnosis present

## 2021-05-05 DIAGNOSIS — I248 Other forms of acute ischemic heart disease: Secondary | ICD-10-CM | POA: Diagnosis present

## 2021-05-05 LAB — CBC
HCT: 36.5 % — ABNORMAL LOW (ref 39.0–52.0)
Hemoglobin: 12 g/dL — ABNORMAL LOW (ref 13.0–17.0)
MCH: 28 pg (ref 26.0–34.0)
MCHC: 32.9 g/dL (ref 30.0–36.0)
MCV: 85.3 fL (ref 80.0–100.0)
Platelets: 99 10*3/uL — ABNORMAL LOW (ref 150–400)
RBC: 4.28 MIL/uL (ref 4.22–5.81)
RDW: 14.6 % (ref 11.5–15.5)
WBC: 5.1 10*3/uL (ref 4.0–10.5)
nRBC: 0 % (ref 0.0–0.2)

## 2021-05-05 LAB — BASIC METABOLIC PANEL
Anion gap: 13 (ref 5–15)
BUN: 24 mg/dL — ABNORMAL HIGH (ref 8–23)
CO2: 25 mmol/L (ref 22–32)
Calcium: 8.8 mg/dL — ABNORMAL LOW (ref 8.9–10.3)
Chloride: 99 mmol/L (ref 98–111)
Creatinine, Ser: 1.57 mg/dL — ABNORMAL HIGH (ref 0.61–1.24)
GFR, Estimated: 44 mL/min — ABNORMAL LOW (ref >60)
Glucose, Bld: 168 mg/dL — ABNORMAL HIGH (ref 70–99)
Potassium: 3.8 mmol/L (ref 3.5–5.1)
Sodium: 137 mmol/L (ref 135–145)

## 2021-05-05 LAB — TROPONIN I (HIGH SENSITIVITY): Troponin I (High Sensitivity): 17 ng/L (ref <18)

## 2021-05-05 NOTE — ED Notes (Addendum)
Attempted to ambulate this pt with walker. Pt initially requested to get discharge to home with care taker who claims won't be able to assist pt to ambulate as she also has broken back.  Pt unable to transfer to/from bed, stand or ambulate by himself . Provider at bedside and aware .  Provider planned to keep this Pt in ED for consult to CM for SNF placement.

## 2021-05-05 NOTE — ED Provider Notes (Signed)
[facility] Emergency Department Provider Note  ____________________________________________  Time seen: Approximately 6:11 PM  I have reviewed the triage vital signs and the nursing notes.   HISTORY  Chief Complaint Fall    HPI Peter Robertson is a 80 y.o. male who presents the emergency department after falling and hitting his head.  Patient states that occasionally his legs will give out from underneath him, this occurred today and he fell backwards striking his head.  Patient states that he did not lose consciousness.  He did not have any symptoms preceding his fall.  Patient states that this does occur sometimes, this was not atypical for him.  He denied any other injury or complaint but states that he does have a headache.  Patient is on Plavix.  He denies any visual changes, neck pain, extremity pain.  No other symptoms at this time.       Past Medical History:  Diagnosis Date   Asthma    BPH (benign prostatic hyperplasia)    Chronic kidney disease    Kidney Stones   Coronary artery disease    Diabetes mellitus without complication (HCC)    Heart murmur    Hyperlipidemia    Hypertension    Myocardial infarction [facility])     Patient Active Problem List   Diagnosis Date Noted   Encounter for psychological evaluation 02/15/2020   AKI (acute kidney injury) ([place]) 02/12/2020   Vertigo 10/11/2019   BPPV (benign paroxysmal positional vertigo) 10/10/2019   CAD (coronary artery disease) 10/10/2019   Diabetes mellitus ([place]) 10/10/2019   BPH (benign prostatic hyperplasia) 10/10/2019   Thrombocytopenia ([place]) 10/10/2019   Dark stools 10/10/2019   Unstable angina ([place]) 05/20/2018   Chest pain 01/30/2017    Past Surgical History:  Procedure Laterality Date   COLONOSCOPY WITH PROPOFOL N/A 12/03/2014   Procedure: COLONOSCOPY WITH PROPOFOL;  Surgeon: Manya Silvas, MD;  Location: [place];  Service: Endoscopy;  Laterality: N/A;   CORONARY ARTERY  BYPASS GRAFT     LEFT HEART CATH AND CORS/GRAFTS ANGIOGRAPHY N/A 05/22/2018   Procedure: LEFT HEART CATH AND CORS/GRAFTS ANGIOGRAPHY poss PCI;  Surgeon: Minna Merritts, MD;  Location: [place] [facility];  Service: Cardiovascular;  Laterality: N/A;    Prior to Admission medications   Medication Sig Start Date End Date Taking? Authorizing Provider  acetaminophen (TYLENOL) 500 MG tablet Take 1,000 mg by mouth every 6 (six) hours as needed.    [provider]  atorvastatin (LIPITOR) 80 MG tablet Take 80 mg by mouth at bedtime.    [provider]  cholecalciferol (VITAMIN D3) 25 MCG (1000 UT) tablet Take 1,000 Units by mouth 2 (two) times daily.    [provider]  clopidogrel (PLAVIX) 75 MG tablet Take 75 mg by mouth daily. 08/15/19   [provider]  furosemide (LASIX) 20 MG tablet Take 1 tablet (20 mg total) by mouth every other day. 10/17/19   Samuella Cota, MD  gabapentin (NEURONTIN) 100 MG capsule Take 100 mg by mouth 2 (two) times daily.  01/23/20   [provider]  HYDROcodone-acetaminophen (NORCO) 5-325 MG tablet Take 1 tablet by mouth every 6 (six) hours as needed for moderate pain. 01/17/20   Paulette Blanch, MD  insulin glargine (LANTUS) 100 UNIT/ML injection Inject 38 Units into the skin at bedtime.     [provider]  isosorbide mononitrate (IMDUR) 120 MG 24 hr tablet Take 120 mg by mouth daily.  [provider]  Melatonin 10 MG CAPS Take 1 capsule by mouth at bedtime.     [provider]  metFORMIN (GLUCOPHAGE) 500 MG tablet Take 1,000 mg by mouth 2 (two) times daily. 09/22/19   [provider]  metoprolol succinate (TOPROL-XL) 100 MG 24 hr tablet at bedtime.    [provider]  metoprolol succinate (TOPROL-XL) 50 MG 24 hr tablet at bedtime. TAKE 1 TABLET (50 MG TOTAL) BY MOUTH TAKE WITH 100 MG TABLET OF METOPROLOL (TOTAL DOSE 150MG )    [provider]  Multiple Vitamins-Minerals  (PRESERVISION AREDS 2+MULTI VIT PO) Take by mouth.    [provider]  nitroGLYCERIN (NITROSTAT) 0.4 MG SL tablet Place 0.4 mg under the tongue every 5 (five) minutes as needed for chest pain.     [provider]  NOVOLIN R 100 UNIT/ML injection Inject into the skin 3 (three) times daily before meals. Inject 0-0.025 mL (0-2.5 Units total) under the skin Three (3) times a day before meals. Insulin Sliding Scale: Blood Glucose - 151-200 mg/dL = 0.5 units; 201-250 mg/dL = 1 unit; 251-300 mg/dL = 1.5 units; 301-350 mg/dL= 2 units; 351-400 mg/dL = 2.5 units 10 mL 01/27/20   [provider]  omeprazole (PRILOSEC) 40 MG capsule Take 40 mg by mouth 2 (two) times daily. 07/19/19   [provider]  sertraline (ZOLOFT) 100 MG tablet Take 200 mg by mouth daily.     [provider]  tamsulosin (FLOMAX) 0.4 MG CAPS capsule Take 0.8 mg by mouth at bedtime.     [provider]    Allergies Sulfamethoxazole-trimethoprim and Tape  Family History  Problem Relation Age of Onset   Diabetes Mother    Cancer Mother    Hypertension Father    Hypertension Brother    Diabetes Brother     Social History Social History   Tobacco Use   Smoking status: Former    Types: Cigarettes    Quit date: 06/19/1984    Years since quitting: 36.9   Smokeless tobacco: Never  Vaping Use   Vaping Use: Never used  Substance Use Topics   Alcohol use: No   Drug use: No     Review of Systems  Constitutional: No fever/chills Eyes: No visual changes. No discharge ENT: No upper respiratory complaints. Cardiovascular: no chest pain. Respiratory: no cough. No SOB. Gastrointestinal: No abdominal pain.  No nausea, no vomiting.  No diarrhea.  No constipation. Musculoskeletal: Negative for musculoskeletal pain. Skin: Negative for rash, abrasions, lacerations, ecchymosis. Neurological: Positive for posttraumatic headache.  Denies focal weakness or numbness.  10 System ROS  otherwise negative.  ____________________________________________   PHYSICAL EXAM:  VITAL SIGNS: ED Triage Vitals [05/05/21 1528]  Enc Vitals Group     BP (!) 179/99     Pulse Rate 97     Resp 18     Temp 98.5 F (36.9 C)     Temp Source Oral     SpO2 98 %     Weight 182 lb (82.6 kg)     Height 5\' 6"  (1.676 m)     Head Circumference      Peak Flow      Pain Score 0     Pain Loc      Pain Edu?      Excl. in [place]?      Constitutional: Alert and oriented. Well appearing and in no acute distress. Eyes: Conjunctivae are normal. PERRL. EOMI. Head: Atraumatic.  No abrasions,  lacerations, open wounds.  No palpable abnormality, crepitus.  No battle signs, raccoon eyes, serosanguineous fluid drainage from the ears or nares. ENT:      Ears:       Nose: No congestion/rhinnorhea.      Mouth/Throat: Mucous membranes are moist.  Neck: No stridor.  No cervical spine tenderness to palpation.  Cardiovascular: Normal rate, regular rhythm. Normal S1 and S2.  Good peripheral circulation. Respiratory: Normal respiratory effort without tachypnea or retractions. Lungs CTAB. Good air entry to the bases with no decreased or absent breath sounds. Musculoskeletal: Full range of motion to all extremities. No gross deformities appreciated. Neurologic:  Normal speech and language. No gross focal neurologic deficits are appreciated.  Patient neurologically intact at this time with cranial nerves testing 2 through 12 grossly intact. Skin:  Skin is warm, dry and intact. No rash noted. Psychiatric: Mood and affect are normal. Speech and behavior are normal. Patient exhibits appropriate insight and judgement.   ____________________________________________   LABS (all labs ordered are listed, but only abnormal results are displayed)  Labs Reviewed  CBC - Abnormal; Notable for the following components:      Result Value   Hemoglobin 12.0 (*)    HCT 36.5 (*)    Platelets 99 (*)    All other components  within normal limits  BASIC METABOLIC PANEL - Abnormal; Notable for the following components:   Glucose, Bld 168 (*)    BUN 24 (*)    Creatinine, Ser 1.57 (*)    Calcium 8.8 (*)    GFR, Estimated 44 (*)    All other components within normal limits  URINALYSIS, ROUTINE W REFLEX MICROSCOPIC  TROPONIN I (HIGH SENSITIVITY)  TROPONIN I (HIGH SENSITIVITY)   ____________________________________________  EKG   ____________________________________________  RADIOLOGY I personally viewed and evaluated these images as part of my medical decision making, as well as reviewing the written report by the radiologist.  ED Provider Interpretation: Initial imaging was concerning for a possible 8 mm increased density concerning for possible hemorrhage.  However repeat imaging revealed no evidence of intracranial hemorrhage.  No acute cardiovascular findings on chest x-ray  DG Chest 2 View  Result Date: 05/05/2021 CLINICAL DATA:  Status post fall. EXAM: CHEST - 2 VIEW COMPARISON:  August 30, 2020 FINDINGS: Multiple sternal wires are seen. Bilateral chronic appearing lung markings are noted with multiple calcified lung nodules of various sizes seen bilaterally. There is no evidence of acute infiltrate, pleural effusion or pneumothorax. The cardiac silhouette is within normal limits. The visualized skeletal structures are unremarkable. IMPRESSION: 1. Evidence of prior median sternotomy with findings consistent with history of sarcoidosis. 2. No acute or active cardiopulmonary disease. Electronically Signed   By: Virgina Norfolk M.D.   On: 05/05/2021 21:31   CT HEAD WO CONTRAST (5MM)  Result Date: 05/05/2021 CLINICAL DATA:  Recent fall with headaches and neck pain, initial encounter EXAM: CT HEAD WITHOUT CONTRAST CT CERVICAL SPINE WITHOUT CONTRAST TECHNIQUE: Multidetector CT imaging of the head and cervical spine was performed following the standard protocol without intravenous contrast. Multiplanar CT image  reconstructions of the cervical spine were also generated. COMPARISON:  08/30/2020, CT of the head from earlier in the same day. FINDINGS: CT HEAD FINDINGS Brain: The area of increased attenuation adjacent to the left frontal cortex is no longer identified and felt to be artifactual in nature likely related to mild motion and beam hardening artifact. Mild atrophic changes are seen. No findings to suggest acute hemorrhage, acute infarction  or space-occupying mass lesion is noted. Vascular: No hyperdense vessel or unexpected calcification. Skull: Normal. Negative for fracture or focal lesion. Sinuses/Orbits: No acute finding. Other: None. CT CERVICAL SPINE FINDINGS Alignment: Mild straightening of the normal cervical lordosis is noted. Skull base and vertebrae: 7 cervical segments are well visualized. Vertebral body height is well maintained. Mild osteophytic changes are noted from C4-C7. Mild anterolisthesis of C4 on C5 is seen. Facet hypertrophic changes are noted bilaterally. No acute fracture or acute facet abnormality is seen. Soft tissues and spinal canal: Surrounding soft tissue structures are within normal limits with the exception of a hypodense nodule within the right lobe of the thyroid measuring 19 mm. This is stable in appearance from the prior CT. Upper chest: Visualized lung apices demonstrate biapical scarring. Right-sided pleural effusion is noted new from the prior exam. Chest x-ray is recommended for further evaluation. Other: None IMPRESSION: CT of the head: No acute intracranial abnormality noted. The previously seen area of suggested hemorrhage is felt to be artifactual in nature related to motion artifact. CT of the cervical spine: Multi level degenerative changes similar to that seen on prior exams. 1.9 cm right thyroid nodule. Recommend nonemergent thyroid US (ref: J Am Coll Radiol. 2015 Feb;12(2): 143-50). New right-sided pleural effusion. Chest x-ray is recommended for further evaluation.  Electronically Signed   By: Inez Catalina M.D.   On: 05/05/2021 20:16   CT Head Wo Contrast  Result Date: 05/05/2021 CLINICAL DATA:  Trauma, fall EXAM: CT HEAD WITHOUT CONTRAST TECHNIQUE: Contiguous axial images were obtained from the base of the skull through the vertex without intravenous contrast. COMPARISON:  08/30/2020 FINDINGS: Brain: Motion artifacts are seen in the some of the images. Additional images were obtained.rrw in image 19 of series 3, there is 8 x 4 mm area of subtle increased density in the left frontal cortex. Area of this finding is not included in the repeated images. There is no significant focal mass effect. There is no demonstrable epidural or subdural fluid collection. Ventricles are prominent. There is no shift of midline structures. Cortical sulci are prominent. There is subtle decreased density in the periventricular white matter. Vascular: Unremarkable. Skull: No fracture is seen in calvarium. Sinuses/Orbits: Unremarkable. Other: None IMPRESSION: There is 8 mm subtle increased density in the left frontal cortex close to the inner table of calvarium. This may suggest small focus of petechial hemorrhage due to contusion. In view of motion artifacts, possibility an artifact is not excluded. Short-term follow-up CT or MRI in 2-3 hours may be considered. Atrophy. Small-vessel disease. Electronically Signed   By: Elmer Picker M.D.   On: 05/05/2021 16:45   CT Cervical Spine Wo Contrast  Result Date: 05/05/2021 CLINICAL DATA:  Recent fall with headaches and neck pain, initial encounter EXAM: CT HEAD WITHOUT CONTRAST CT CERVICAL SPINE WITHOUT CONTRAST TECHNIQUE: Multidetector CT imaging of the head and cervical spine was performed following the standard protocol without intravenous contrast. Multiplanar CT image reconstructions of the cervical spine were also generated. COMPARISON:  08/30/2020, CT of the head from earlier in the same day. FINDINGS: CT HEAD FINDINGS Brain: The  area of increased attenuation adjacent to the left frontal cortex is no longer identified and felt to be artifactual in nature likely related to mild motion and beam hardening artifact. Mild atrophic changes are seen. No findings to suggest acute hemorrhage, acute infarction or space-occupying mass lesion is noted. Vascular: No hyperdense vessel or unexpected calcification. Skull: Normal. Negative for fracture or focal lesion.  Sinuses/Orbits: No acute finding. Other: None. CT CERVICAL SPINE FINDINGS Alignment: Mild straightening of the normal cervical lordosis is noted. Skull base and vertebrae: 7 cervical segments are well visualized. Vertebral body height is well maintained. Mild osteophytic changes are noted from C4-C7. Mild anterolisthesis of C4 on C5 is seen. Facet hypertrophic changes are noted bilaterally. No acute fracture or acute facet abnormality is seen. Soft tissues and spinal canal: Surrounding soft tissue structures are within normal limits with the exception of a hypodense nodule within the right lobe of the thyroid measuring 19 mm. This is stable in appearance from the prior CT. Upper chest: Visualized lung apices demonstrate biapical scarring. Right-sided pleural effusion is noted new from the prior exam. Chest x-ray is recommended for further evaluation. Other: None IMPRESSION: CT of the head: No acute intracranial abnormality noted. The previously seen area of suggested hemorrhage is felt to be artifactual in nature related to motion artifact. CT of the cervical spine: Multi level degenerative changes similar to that seen on prior exams. 1.9 cm right thyroid nodule. Recommend nonemergent thyroid US (ref: J Am Coll Radiol. 2015 Feb;12(2): 143-50). New right-sided pleural effusion. Chest x-ray is recommended for further evaluation. Electronically Signed   By: Inez Catalina M.D.   On: 05/05/2021 20:16    ____________________________________________    PROCEDURES  Procedure(s) performed:     Procedures    Medications - No data to display   ____________________________________________   INITIAL IMPRESSION / ASSESSMENT AND PLAN / ED COURSE  Pertinent labs & imaging results that were available during my care of the patient were reviewed by me and considered in my medical decision making (see chart for details).  Review of the [place] CSRS was performed in accordance of the [place] prior to dispensing any controlled drugs.           Patient's diagnosis is consistent with fall, head injury, weakness.  Patient presented to the emergency department after sustaining what reportedly was a fall due to weak legs.  Patient states that his "legs give out all the time".  Patient denied any other symptoms preceding this fall.  He did hit his head and was complaining of a headache with no other complaints.  Initial imaging was concerning for a potential hemorrhage the repeat imaging reveals no evidence of hemorrhage.  Patient was neurologically intact.  During the encounter patient's neighbor who is in charge of taking care of the patient presented to the ED.  The patient endorsed to her at that time that he cannot move his legs.  I went in to discuss this finding with the patient and at that time he made no effort to move his legs.  When I discussed that we would need to image his spine to ensure no injury patient states that he in fact could not move his legs and proceeded to demonstrate moving both legs independently.  Patient then made comments to his neighbor that he could not go home, could not walk, can take care of himself.  When I discussed the patient would have to stay and be placed into a long-term care facility he stated "oh I can walk, I just did not want to."  Neighbor states that the patient does frequently complain of these complaints whenever he feels like he is not receiving an adequate amount of attention.  She believes that he is doing the same at this time.  This does coincide  with the patient's repeated complaints and when I addressed these issues and  discussed what it would entail to work through them, patient will change his complaint and reverse his positions such as he cannot feel or use his legs, he cannot walk.  However as we discussed going home and that the requirement to do so would be to demonstrate walking with a walker, patient could not walk independently with a walker.  As such the patient will need likely placement into a skilled nursing facility..  At this time urine is still pending, and I will transfer care to attending provider, Dr. Beather Arbour pending this last result.  If patient has a concerning urinalysis with the weakness he will need antibiotics and possibly admission if he is not improving, however if urinalysis is reassuring he will likely need placement into long-term care facility.     ____________________________________________  FINAL CLINICAL IMPRESSION(S) / ED DIAGNOSES  Final diagnoses:  Fall, initial encounter  Injury of head, initial encounter      NEW MEDICATIONS STARTED DURING THIS VISIT:  ED Discharge Orders     None           This chart was dictated using voice recognition software/Dragon. Despite best efforts to proofread, errors can occur which can change the meaning. Any change was purely unintentional.    Darletta Moll, PA-C 05/06/21 0105    Paulette Blanch, MD 05/12/21 [phone]

## 2021-05-05 NOTE — ED Triage Notes (Signed)
Pt here via [facility] with a fall today. Pt fell from a standing position and hit the back of his head. Pt has a smal hematome to the back of his head and he takes Plavix. Pt denies pain or LOC. Hx of dementia and open heart surgery in 2002.   POA [phone]

## 2021-05-06 ENCOUNTER — Encounter: Payer: Self-pay | Admitting: Internal Medicine

## 2021-05-06 ENCOUNTER — Emergency Department: Payer: Medicare Other

## 2021-05-06 ENCOUNTER — Other Ambulatory Visit: Payer: Self-pay

## 2021-05-06 DIAGNOSIS — I5031 Acute diastolic (congestive) heart failure: Secondary | ICD-10-CM | POA: Diagnosis not present

## 2021-05-06 DIAGNOSIS — A419 Sepsis, unspecified organism: Secondary | ICD-10-CM

## 2021-05-06 DIAGNOSIS — J45909 Unspecified asthma, uncomplicated: Secondary | ICD-10-CM | POA: Diagnosis present

## 2021-05-06 DIAGNOSIS — J069 Acute upper respiratory infection, unspecified: Secondary | ICD-10-CM

## 2021-05-06 DIAGNOSIS — D696 Thrombocytopenia, unspecified: Secondary | ICD-10-CM

## 2021-05-06 DIAGNOSIS — Z8249 Family history of ischemic heart disease and other diseases of the circulatory system: Secondary | ICD-10-CM | POA: Diagnosis not present

## 2021-05-06 DIAGNOSIS — F0390 Unspecified dementia without behavioral disturbance: Secondary | ICD-10-CM | POA: Diagnosis not present

## 2021-05-06 DIAGNOSIS — N1831 Chronic kidney disease, stage 3a: Secondary | ICD-10-CM | POA: Diagnosis present

## 2021-05-06 DIAGNOSIS — W19XXXA Unspecified fall, initial encounter: Secondary | ICD-10-CM

## 2021-05-06 DIAGNOSIS — E041 Nontoxic single thyroid nodule: Secondary | ICD-10-CM | POA: Diagnosis present

## 2021-05-06 DIAGNOSIS — E785 Hyperlipidemia, unspecified: Secondary | ICD-10-CM | POA: Diagnosis present

## 2021-05-06 DIAGNOSIS — I252 Old myocardial infarction: Secondary | ICD-10-CM | POA: Diagnosis not present

## 2021-05-06 DIAGNOSIS — R778 Other specified abnormalities of plasma proteins: Secondary | ICD-10-CM

## 2021-05-06 DIAGNOSIS — I130 Hypertensive heart and chronic kidney disease with heart failure and stage 1 through stage 4 chronic kidney disease, or unspecified chronic kidney disease: Secondary | ICD-10-CM | POA: Diagnosis present

## 2021-05-06 DIAGNOSIS — J690 Pneumonitis due to inhalation of food and vomit: Secondary | ICD-10-CM | POA: Insufficient documentation

## 2021-05-06 DIAGNOSIS — I5033 Acute on chronic diastolic (congestive) heart failure: Secondary | ICD-10-CM | POA: Diagnosis present

## 2021-05-06 DIAGNOSIS — U071 COVID-19: Secondary | ICD-10-CM | POA: Diagnosis present

## 2021-05-06 DIAGNOSIS — I959 Hypotension, unspecified: Secondary | ICD-10-CM | POA: Diagnosis not present

## 2021-05-06 DIAGNOSIS — Y92009 Unspecified place in unspecified non-institutional (private) residence as the place of occurrence of the external cause: Secondary | ICD-10-CM

## 2021-05-06 DIAGNOSIS — N1832 Chronic kidney disease, stage 3b: Secondary | ICD-10-CM | POA: Diagnosis present

## 2021-05-06 DIAGNOSIS — Z833 Family history of diabetes mellitus: Secondary | ICD-10-CM | POA: Diagnosis not present

## 2021-05-06 DIAGNOSIS — T380X5A Adverse effect of glucocorticoids and synthetic analogues, initial encounter: Secondary | ICD-10-CM | POA: Diagnosis present

## 2021-05-06 DIAGNOSIS — J9601 Acute respiratory failure with hypoxia: Secondary | ICD-10-CM | POA: Diagnosis present

## 2021-05-06 DIAGNOSIS — I10 Essential (primary) hypertension: Secondary | ICD-10-CM | POA: Diagnosis present

## 2021-05-06 DIAGNOSIS — N183 Chronic kidney disease, stage 3 unspecified: Secondary | ICD-10-CM | POA: Diagnosis present

## 2021-05-06 DIAGNOSIS — F0393 Unspecified dementia, unspecified severity, with mood disturbance: Secondary | ICD-10-CM | POA: Diagnosis present

## 2021-05-06 DIAGNOSIS — I248 Other forms of acute ischemic heart disease: Secondary | ICD-10-CM | POA: Diagnosis present

## 2021-05-06 DIAGNOSIS — D869 Sarcoidosis, unspecified: Secondary | ICD-10-CM | POA: Diagnosis present

## 2021-05-06 DIAGNOSIS — J1282 Pneumonia due to coronavirus disease 2019: Secondary | ICD-10-CM | POA: Diagnosis present

## 2021-05-06 DIAGNOSIS — I2510 Atherosclerotic heart disease of native coronary artery without angina pectoris: Secondary | ICD-10-CM | POA: Diagnosis present

## 2021-05-06 DIAGNOSIS — I5032 Chronic diastolic (congestive) heart failure: Secondary | ICD-10-CM | POA: Diagnosis not present

## 2021-05-06 DIAGNOSIS — W1839XA Other fall on same level, initial encounter: Secondary | ICD-10-CM | POA: Diagnosis present

## 2021-05-06 DIAGNOSIS — Z87442 Personal history of urinary calculi: Secondary | ICD-10-CM | POA: Diagnosis not present

## 2021-05-06 DIAGNOSIS — N400 Enlarged prostate without lower urinary tract symptoms: Secondary | ICD-10-CM | POA: Diagnosis present

## 2021-05-06 DIAGNOSIS — K219 Gastro-esophageal reflux disease without esophagitis: Secondary | ICD-10-CM | POA: Diagnosis present

## 2021-05-06 DIAGNOSIS — N179 Acute kidney failure, unspecified: Secondary | ICD-10-CM | POA: Diagnosis present

## 2021-05-06 DIAGNOSIS — F32A Depression, unspecified: Secondary | ICD-10-CM | POA: Diagnosis present

## 2021-05-06 DIAGNOSIS — R0602 Shortness of breath: Secondary | ICD-10-CM | POA: Diagnosis present

## 2021-05-06 DIAGNOSIS — E1122 Type 2 diabetes mellitus with diabetic chronic kidney disease: Secondary | ICD-10-CM | POA: Diagnosis present

## 2021-05-06 LAB — RESP PANEL BY RT-PCR (FLU A&B, COVID) ARPGX2
Influenza A by PCR: NEGATIVE
Influenza B by PCR: NEGATIVE
SARS Coronavirus 2 by RT PCR: POSITIVE — AB

## 2021-05-06 LAB — URINALYSIS, ROUTINE W REFLEX MICROSCOPIC
Bacteria, UA: NONE SEEN
Bilirubin Urine: NEGATIVE
Glucose, UA: NEGATIVE mg/dL
Ketones, ur: 5 mg/dL — AB
Leukocytes,Ua: NEGATIVE
Nitrite: NEGATIVE
Protein, ur: >=300 mg/dL — AB
Specific Gravity, Urine: 1.013 (ref 1.005–1.030)
Squamous Epithelial / HPF: NONE SEEN (ref 0–5)
pH: 7 (ref 5.0–8.0)

## 2021-05-06 LAB — D-DIMER, QUANTITATIVE: D-Dimer, Quant: 1.32 ug/mL-FEU — ABNORMAL HIGH (ref 0.00–0.50)

## 2021-05-06 LAB — C-REACTIVE PROTEIN: CRP: 9.3 mg/dL — ABNORMAL HIGH (ref <1.0)

## 2021-05-06 LAB — TROPONIN I (HIGH SENSITIVITY)
Troponin I (High Sensitivity): 31 ng/L — ABNORMAL HIGH (ref <18)
Troponin I (High Sensitivity): 31 ng/L — ABNORMAL HIGH (ref <18)
Troponin I (High Sensitivity): 32 ng/L — ABNORMAL HIGH (ref <18)
Troponin I (High Sensitivity): 56 ng/L — ABNORMAL HIGH (ref <18)
Troponin I (High Sensitivity): 52 ng/L — ABNORMAL HIGH (ref <18)

## 2021-05-06 LAB — CBG MONITORING, ED
Glucose-Capillary: 210 mg/dL — ABNORMAL HIGH (ref 70–99)
Glucose-Capillary: 209 mg/dL — ABNORMAL HIGH (ref 70–99)
Glucose-Capillary: 188 mg/dL — ABNORMAL HIGH (ref 70–99)
Glucose-Capillary: 124 mg/dL — ABNORMAL HIGH (ref 70–99)

## 2021-05-06 LAB — BLOOD GAS, VENOUS
Acid-base deficit: 0.1 mmol/L (ref 0.0–2.0)
Bicarbonate: 25.4 mmol/L (ref 20.0–28.0)
O2 Saturation: 93.3 %
Patient temperature: 37
pCO2, Ven: 44 mmHg (ref 44.0–60.0)
pH, Ven: 7.37 (ref 7.250–7.430)
pO2, Ven: 70 mmHg — ABNORMAL HIGH (ref 32.0–45.0)

## 2021-05-06 LAB — TYPE AND SCREEN
ABO/RH(D): O POS
Antibody Screen: NEGATIVE

## 2021-05-06 LAB — LACTIC ACID, PLASMA
Lactic Acid, Venous: 0.8 mmol/L (ref 0.5–1.9)
Lactic Acid, Venous: 0.9 mmol/L (ref 0.5–1.9)

## 2021-05-06 LAB — HEPATIC FUNCTION PANEL
ALT: 11 U/L (ref 0–44)
AST: 18 U/L (ref 15–41)
Albumin: 3.7 g/dL (ref 3.5–5.0)
Alkaline Phosphatase: 60 U/L (ref 38–126)
Bilirubin, Direct: 0.2 mg/dL (ref 0.0–0.2)
Indirect Bilirubin: 0.9 mg/dL (ref 0.3–0.9)
Total Bilirubin: 1.1 mg/dL (ref 0.3–1.2)
Total Protein: 6.4 g/dL — ABNORMAL LOW (ref 6.5–8.1)

## 2021-05-06 LAB — FERRITIN: Ferritin: 104 ng/mL (ref 24–336)

## 2021-05-06 LAB — BRAIN NATRIURETIC PEPTIDE: B Natriuretic Peptide: 562.3 pg/mL — ABNORMAL HIGH (ref 0.0–100.0)

## 2021-05-06 LAB — PROCALCITONIN: Procalcitonin: <0.1 ng/mL

## 2021-05-06 LAB — HEMOGLOBIN A1C
Hgb A1c MFr Bld: 7.1 % — ABNORMAL HIGH (ref 4.8–5.6)
Mean Plasma Glucose: 157.07 mg/dL

## 2021-05-06 MED ORDER — METOPROLOL SUCCINATE ER 50 MG PO TB24: 50.0000 mg | ORAL_TABLET | Freq: Every day | ORAL | Status: DC

## 2021-05-06 MED ORDER — GABAPENTIN 100 MG PO CAPS
100.0000 mg | ORAL_CAPSULE | Freq: Every day | ORAL | Status: DC
  Administered 2021-05-06 – 2021-05-22 (×17): 100 mg via ORAL
  Filled 2021-05-06 (×17): qty 1

## 2021-05-06 MED ORDER — DM-GUAIFENESIN ER 30-600 MG PO TB12
1.0000 | ORAL_TABLET | Freq: Two times a day (BID) | ORAL | Status: DC | PRN
  Administered 2021-05-06 – 2021-05-17 (×8): 1 via ORAL
  Filled 2021-05-06 (×8): qty 1

## 2021-05-06 MED ORDER — HYDRALAZINE HCL 20 MG/ML IJ SOLN: 5.0000 mg | INTRAMUSCULAR | Status: DC | PRN

## 2021-05-06 MED ORDER — ONDANSETRON HCL 4 MG/2ML IJ SOLN
4.0000 mg | Freq: Once | INTRAMUSCULAR | Status: AC
  Administered 2021-05-06: 4 mg via INTRAVENOUS
  Filled 2021-05-06: qty 2

## 2021-05-06 MED ORDER — METHYLPREDNISOLONE SODIUM SUCC 40 MG IJ SOLR
40.0000 mg | Freq: Two times a day (BID) | INTRAMUSCULAR | Status: DC
  Administered 2021-05-06 – 2021-05-09 (×7): 40 mg via INTRAVENOUS
  Filled 2021-05-06 (×7): qty 1

## 2021-05-06 MED ORDER — FUROSEMIDE 10 MG/ML IJ SOLN
40.0000 mg | Freq: Once | INTRAMUSCULAR | Status: AC
  Administered 2021-05-06: 40 mg via INTRAVENOUS
  Filled 2021-05-06: qty 4

## 2021-05-06 MED ORDER — INSULIN GLARGINE-YFGN 100 UNIT/ML ~~LOC~~ SOLN
29.0000 [IU] | Freq: Every day | SUBCUTANEOUS | Status: DC
  Administered 2021-05-06 – 2021-05-10 (×5): 29 [IU] via SUBCUTANEOUS
  Filled 2021-05-06 (×6): qty 0.29

## 2021-05-06 MED ORDER — MELATONIN 5 MG PO TABS
10.0000 mg | ORAL_TABLET | Freq: Every evening | ORAL | Status: DC | PRN
  Administered 2021-05-06 – 2021-05-19 (×9): 10 mg via ORAL
  Filled 2021-05-06 (×11): qty 2

## 2021-05-06 MED ORDER — ACETAMINOPHEN 325 MG PO TABS
650.0000 mg | ORAL_TABLET | Freq: Four times a day (QID) | ORAL | Status: DC | PRN
  Administered 2021-05-13 – 2021-05-17 (×8): 650 mg via ORAL
  Filled 2021-05-06 (×9): qty 2

## 2021-05-06 MED ORDER — IPRATROPIUM-ALBUTEROL 20-100 MCG/ACT IN AERS
2.0000 | INHALATION_SPRAY | Freq: Four times a day (QID) | RESPIRATORY_TRACT | Status: DC
  Administered 2021-05-07 – 2021-05-11 (×16): 2 via RESPIRATORY_TRACT
  Filled 2021-05-06: qty 4

## 2021-05-06 MED ORDER — OCUVITE-LUTEIN PO CAPS
1.0000 | ORAL_CAPSULE | Freq: Two times a day (BID) | ORAL | Status: DC
  Administered 2021-05-06 – 2021-05-23 (×34): 1 via ORAL
  Filled 2021-05-06 (×37): qty 1

## 2021-05-06 MED ORDER — MORPHINE SULFATE (PF) 2 MG/ML IV SOLN
0.5000 mg | Freq: Once | INTRAVENOUS | Status: AC
  Administered 2021-05-06: 0.5 mg via INTRAVENOUS
  Filled 2021-05-06: qty 1

## 2021-05-06 MED ORDER — ONDANSETRON 4 MG PO TBDP: 4.0000 mg | ORAL_TABLET | Freq: Once | ORAL | Status: DC

## 2021-05-06 MED ORDER — METHYLPREDNISOLONE SODIUM SUCC 125 MG IJ SOLR: 1.0000 mg/kg | Freq: Two times a day (BID) | INTRAMUSCULAR | Status: DC

## 2021-05-06 MED ORDER — VITAMIN D 25 MCG (1000 UNIT) PO TABS
1000.0000 [IU] | ORAL_TABLET | Freq: Every day | ORAL | Status: DC
Start: 2021-05-07 — End: 2021-05-23
  Administered 2021-05-07 – 2021-05-23 (×17): 1000 [IU] via ORAL
  Filled 2021-05-06 (×17): qty 1

## 2021-05-06 MED ORDER — LORAZEPAM 2 MG/ML IJ SOLN
1.0000 mg | Freq: Once | INTRAMUSCULAR | Status: AC
  Administered 2021-05-06: 1 mg via INTRAVENOUS
  Filled 2021-05-06: qty 1

## 2021-05-06 MED ORDER — FUROSEMIDE 10 MG/ML IJ SOLN
40.0000 mg | Freq: Every day | INTRAMUSCULAR | Status: DC
  Administered 2021-05-07 – 2021-05-08 (×2): 40 mg via INTRAVENOUS
  Filled 2021-05-06 (×2): qty 4

## 2021-05-06 MED ORDER — INSULIN ASPART 100 UNIT/ML IJ SOLN
0.0000 [IU] | Freq: Three times a day (TID) | INTRAMUSCULAR | Status: DC
  Administered 2021-05-06 (×2): 3 [IU] via SUBCUTANEOUS
  Administered 2021-05-07: 9 [IU] via SUBCUTANEOUS
  Administered 2021-05-07: 5 [IU] via SUBCUTANEOUS
  Administered 2021-05-07: 3 [IU] via SUBCUTANEOUS
  Administered 2021-05-08: 9 [IU] via SUBCUTANEOUS
  Administered 2021-05-08: 5 [IU] via SUBCUTANEOUS
  Administered 2021-05-08: 3 [IU] via SUBCUTANEOUS
  Administered 2021-05-09 (×2): 2 [IU] via SUBCUTANEOUS
  Administered 2021-05-09: 9 [IU] via SUBCUTANEOUS
  Administered 2021-05-10 – 2021-05-11 (×3): 2 [IU] via SUBCUTANEOUS
  Administered 2021-05-11: 1 [IU] via SUBCUTANEOUS
  Administered 2021-05-12: 3 [IU] via SUBCUTANEOUS
  Filled 2021-05-06 (×16): qty 1

## 2021-05-06 MED ORDER — SODIUM CHLORIDE 0.9 % IV SOLN
200.0000 mg | Freq: Once | INTRAVENOUS | Status: AC
  Administered 2021-05-06: 200 mg via INTRAVENOUS
  Filled 2021-05-06: qty 40

## 2021-05-06 MED ORDER — METOPROLOL SUCCINATE ER 50 MG PO TB24
50.0000 mg | ORAL_TABLET | Freq: Every day | ORAL | Status: DC
  Administered 2021-05-06 – 2021-05-23 (×18): 50 mg via ORAL
  Filled 2021-05-06 (×18): qty 1

## 2021-05-06 MED ORDER — TAMSULOSIN HCL 0.4 MG PO CAPS
0.8000 mg | ORAL_CAPSULE | Freq: Every day | ORAL | Status: DC
  Administered 2021-05-06 – 2021-05-22 (×17): 0.8 mg via ORAL
  Filled 2021-05-06 (×17): qty 2

## 2021-05-06 MED ORDER — INSULIN ASPART 100 UNIT/ML IJ SOLN
0.0000 [IU] | Freq: Every day | INTRAMUSCULAR | Status: DC
  Administered 2021-05-07: 2 [IU] via SUBCUTANEOUS
  Administered 2021-05-08: 5 [IU] via SUBCUTANEOUS
  Administered 2021-05-09: 4 [IU] via SUBCUTANEOUS
  Administered 2021-05-10: 2 [IU] via SUBCUTANEOUS
  Filled 2021-05-06 (×2): qty 1

## 2021-05-06 MED ORDER — SODIUM CHLORIDE 0.9 % IV BOLUS
1000.0000 mL | Freq: Once | INTRAVENOUS | Status: AC
  Administered 2021-05-06: 1000 mL via INTRAVENOUS

## 2021-05-06 MED ORDER — PREDNISONE 20 MG PO TABS: 50.0000 mg | ORAL_TABLET | Freq: Every day | ORAL | Status: DC

## 2021-05-06 MED ORDER — PANTOPRAZOLE SODIUM 40 MG PO TBEC
40.0000 mg | DELAYED_RELEASE_TABLET | Freq: Every day | ORAL | Status: DC
  Administered 2021-05-07 – 2021-05-23 (×17): 40 mg via ORAL
  Filled 2021-05-06 (×17): qty 1

## 2021-05-06 MED ORDER — SODIUM CHLORIDE 0.9 % IV SOLN
100.0000 mg | Freq: Every day | INTRAVENOUS | Status: AC
  Administered 2021-05-07 – 2021-05-10 (×4): 100 mg via INTRAVENOUS
  Filled 2021-05-06: qty 20
  Filled 2021-05-06 (×2): qty 100
  Filled 2021-05-06: qty 20

## 2021-05-06 MED ORDER — ALBUTEROL SULFATE (2.5 MG/3ML) 0.083% IN NEBU: 2.5000 mg | INHALATION_SOLUTION | RESPIRATORY_TRACT | Status: DC | PRN

## 2021-05-06 MED ORDER — SERTRALINE HCL 50 MG PO TABS
200.0000 mg | ORAL_TABLET | Freq: Every morning | ORAL | Status: DC
  Administered 2021-05-07 – 2021-05-22 (×16): 200 mg via ORAL
  Filled 2021-05-06 (×14): qty 4

## 2021-05-06 MED ORDER — VITAMIN B-12 1000 MCG PO TABS
1000.0000 ug | ORAL_TABLET | Freq: Every day | ORAL | Status: DC
  Administered 2021-05-07 – 2021-05-23 (×17): 1000 ug via ORAL
  Filled 2021-05-06 (×17): qty 1

## 2021-05-06 MED ORDER — ONDANSETRON HCL 4 MG/2ML IJ SOLN
4.0000 mg | Freq: Three times a day (TID) | INTRAMUSCULAR | Status: DC | PRN
  Administered 2021-05-06: 4 mg via INTRAVENOUS
  Filled 2021-05-06: qty 2

## 2021-05-06 MED ORDER — SODIUM CHLORIDE 0.9 % IV SOLN
3.0000 g | Freq: Once | INTRAVENOUS | Status: AC
  Administered 2021-05-06: 3 g via INTRAVENOUS
  Filled 2021-05-06: qty 8

## 2021-05-06 MED ORDER — IPRATROPIUM-ALBUTEROL 0.5-2.5 (3) MG/3ML IN SOLN: 3.0000 mL | RESPIRATORY_TRACT | Status: DC

## 2021-05-06 NOTE — ED Notes (Signed)
Pt cleaned of urine, linens and blankets changed, new chux and male purewick applied. Pt placed on 4 liter's [employer] to eat lunch at this time.

## 2021-05-06 NOTE — ED Notes (Signed)
Pt to CT

## 2021-05-06 NOTE — ED Provider Notes (Addendum)
-----------------------------------------   1:21 AM on 05/06/2021 -----------------------------------------   Will assume care of patient who is pending UA and repeat troponin.  If unremarkable then patient will need TOC border consult.   ----------------------------------------- 4:23 AM on 05/06/2021 -----------------------------------------   UA unremarkable, troponin mildly increased.  Patient vomiting.  Audible rales noted, concern for aspiration.  Will repeat chest x-ray, obtain CT abdomen/pelvis noncontrast as patient is also complaining of abdominal pain.  Obtain COVID swab.  Repeat oral temperature 98.6 F.  Anticipate hospitalization.   ----------------------------------------- 5:13 AM on 05/06/2021 -----------------------------------------   Chest x-ray worsened from prior; review of patient's record does not demonstrate history of CHF.  Strongly suspect aspiration.  Patient hypoxic, placed on 2 L nasal cannula oxygen with saturations 93%.  Will obtain blood cultures, lactic acid.  Start IV Unasyn for aspiration.   ED ECG REPORT I, Fisher Hargadon J, the attending physician, personally viewed and interpreted this ECG.   Date: 05/06/2021  EKG Time: 0535  Rate: 87  Rhythm: normal sinus rhythm  Axis: Normal  Intervals:right bundle branch block  ST&T Change: Nonspecific   ----------------------------------------- 5:53 AM on 05/06/2021 -----------------------------------------   CT abdomen/pelvis interpreted per Dr. [place] Crane:  1. A small layering right pleural effusion is new since last year.  But otherwise visible extensive nodular, partially calcified chronic  lung and pleural disease is stable.     2. No acute or inflammatory process identified in the non-contrast  abdomen or pelvis.     3. Bulky left lower pole renal calculus is stable with no convincing  obstructive uropathy.  Chronic splenomegaly is nonspecific and only mildly progressed since  2017. And superimposed  mild chronic retroperitoneal lymphadenopathy  is stable since 2017.  Chronic bladder wall thickening and small bladder diverticulum.  Chronic prostatomegaly.   Patient currently resting; no further vomiting.  Remains on 2 L nasal cannula oxygen.  Will discuss with hospitalist services for admission.    ----------------------------------------- 6:47 AM on 05/06/2021 -----------------------------------------   Verbal report from laboratory - patient is COVID+; will initiate treatment with IV Solu-Medrol and Remdesivir.   Paulette Blanch, MD 05/06/21 [phone]

## 2021-05-06 NOTE — ED Notes (Signed)
Pt given reprieve from bipap-switched to 4 liters [place]. Pt is alert to touch, oriented x4. NAD noted. Intermittent cough noted.

## 2021-05-06 NOTE — ED Notes (Signed)
MD Blaine Hamper) contacted about patients work of breathing. RT contacted

## 2021-05-06 NOTE — ED Notes (Addendum)
Pt vomited onto himself and the floor - Pt states it came about "suddenly". Pt consented to allowing this RN to throw away his shirt since it was covered in vomit. MD Beather Arbour) made aware. View orders & MAR for intervention.

## 2021-05-06 NOTE — ED Notes (Signed)
Pt transported to floor on cardiac monitoring by this RN at this time. Unit mad e aware of pt arrival.

## 2021-05-06 NOTE — ED Notes (Signed)
RN to bedside to introduce self to pt. RT at bedside too to place bipap. Pt has increased work of breathing.

## 2021-05-06 NOTE — ED Notes (Signed)
Pt given new hospital gown and warm blankets.

## 2021-05-06 NOTE — H&P (Signed)
History and Physical    SEVE MONETTE VQM:[id number] DOB: [birth year] DOA: 05/05/2021  Referring MD/NP/PA:   PCP: Danae Orleans, MD   Patient coming from:  The patient is coming from home.    Chief Complaint: fall and SOB  HPI: Peter Robertson is a 80 y.o. male with medical history significant of hypertension, hyperlipidemia, diabetes mellitus, asthma, GERD, CAD, myocardial infarction, CKD-3A, BPH, BPV, vertigo, thrombocytopenia, dementia, dCHF, who presents with fall and shortness of breath.  Pt has dementia and is a poor historian.  History is limited. I called his neighbor whose name is listed as contact person, but she did not exactly what happened to pt.   Per report, pt fell yesterday. He fell backwards striking his head. He has headache, no neck pain. Patient states that he did not lose consciousness. Patient states that occasionally his legs will give out.  No vision change or hearing loss.  No facial droop or slurred speech.  No unilateral numbness or tingling to extremities.  Patient has dry cough and shortness of breath.  Denies chest pain, fever or chills.  Per report, patient has not vomited once, which seem to have resolved.  Currently no active nausea vomiting, diarrhea.  Denies abdominal pain.  No symptoms of UTI. Patient was found to have acute respiratory distress in the ED, using accessory muscle for breathing.  BiPAP is started in ED.   ED Course: pt was found to have positive COVID PCR, WBC 5.1, lactic acid of 0.8, troponin level 17, 31, 31, 56, negative urinalysis, stable renal function, temperature 99.2, blood pressure 172/79, heart rate 97, RR 32.  BNP 562.  ABG with pH 7.39, CO2 40, O2 45.  Patient is admitted to progressive bed as inpatient.  The first CXR showed sarcoidosis, but no acute or active cardiopulmonary disease. The second CXR showed Interval progression of patchy bilateral airspace disease suggesting pulmonary edema although diffuse infection cannot be  excluded. This is after pt was given 1L of NS bolus in ED.    The first Ct-head showed  There is 8 mm subtle increased density in the left frontal cortex close to the inner table of calvarium. This may suggest small focus of petechial hemorrhage due to contusion. In view of motion artifacts, possibility an artifact is not excluded. Short-term follow-up CT or MRI in 2-3 hours may be considered. Atrophy. Small-vessel disease.  The second CT-head showed  No acute intracranial abnormality noted. The previously seen area of suggested hemorrhage is felt to be artifactual in nature related to motion artifact.   CT of the cervical spine:  Multi level degenerative changes similar to that seen on prior exams. 1.9 cm right thyroid nodule.   CT-abd/pelvis: 1. A small layering right pleural effusion is new since last year. But otherwise visible extensive nodular, partially calcified chronic lung and pleural disease is stable.   2. No acute or inflammatory process identified in the non-contrast abdomen or pelvis.   3. Bulky left lower pole renal calculus is stable with no convincing obstructive uropathy. Chronic splenomegaly is nonspecific and only mildly progressed since 2017. And superimposed mild chronic retroperitoneal lymphadenopathy is stable since 2017. Chronic bladder wall thickening and small bladder diverticulum. Chronic prostatomegaly. Aortic Atherosclerosis (ICD10-I70.0).      Review of Systems:   General: no fevers, chills, no body weight gain, has fatigue HEENT: no blurry vision, hearing changes or sore throat Respiratory: has dyspnea, coughing, no wheezing CV: no chest pain, no palpitations GI: had nausea,  vomiting, no abdominal pain, diarrhea, constipation GU: no dysuria, burning on urination, increased urinary frequency, hematuria  Ext: has trace leg edema Neuro: no unilateral weakness, numbness, or tingling, no vision change or hearing loss. Has fall Skin: no  rash, no skin tear. MSK: No muscle spasm, no deformity, no limitation of range of movement in spin Heme: No easy bruising.  Travel history: No recent long distant travel.  Allergy:  Allergies  Allergen Reactions   Sulfamethoxazole-Trimethoprim Other (See Comments)    dizziness dizziness dizziness    Tape Rash    blisters    Past Medical History:  Diagnosis Date   Asthma    BPH (benign prostatic hyperplasia)    Chronic kidney disease    Kidney Stones   Coronary artery disease    Diabetes mellitus without complication (HCC)    Heart murmur    Hyperlipidemia    Hypertension    Myocardial infarction [facility])     Past Surgical History:  Procedure Laterality Date   COLONOSCOPY WITH PROPOFOL N/A 12/03/2014   Procedure: COLONOSCOPY WITH PROPOFOL;  Surgeon: Manya Silvas, MD;  Location: [place];  Service: Endoscopy;  Laterality: N/A;   CORONARY ARTERY BYPASS GRAFT     LEFT HEART CATH AND CORS/GRAFTS ANGIOGRAPHY N/A 05/22/2018   Procedure: LEFT HEART CATH AND CORS/GRAFTS ANGIOGRAPHY poss PCI;  Surgeon: Minna Merritts, MD;  [facility];  Service: Cardiovascular;  Laterality: N/A;    Social History:  reports that he quit smoking about 36 years ago. He has never used smokeless tobacco. He reports that he does not drink alcohol and does not use drugs.  Family History:  Family History  Problem Relation Age of Onset   Diabetes Mother    Cancer Mother    Hypertension Father    Hypertension Brother    Diabetes Brother      Prior to Admission medications   Medication Sig Start Date End Date Taking? Authorizing Provider  acetaminophen (TYLENOL) 500 MG tablet Take 1,000 mg by mouth every 6 (six) hours as needed.    [provider]  atorvastatin (LIPITOR) 80 MG tablet Take 80 mg by mouth at bedtime.    [provider]  cholecalciferol (VITAMIN D3) 25 MCG (1000 UT) tablet Take 1,000 Units by mouth 2 (two) times daily.    [provider]  clopidogrel (PLAVIX) 75 MG tablet Take 75 mg by mouth daily. 08/15/19   [provider]  furosemide (LASIX) 20 MG tablet Take 1 tablet (20 mg total) by mouth every other day. 10/17/19   Samuella Cota, MD  gabapentin (NEURONTIN) 100 MG capsule Take 100 mg by mouth 2 (two) times daily.  01/23/20   [provider]  HYDROcodone-acetaminophen (NORCO) 5-325 MG tablet Take 1 tablet by mouth every 6 (six) hours as needed for moderate pain. 01/17/20   Paulette Blanch, MD  insulin glargine (LANTUS) 100 UNIT/ML injection Inject 38 Units into the skin at bedtime.     [provider]  isosorbide mononitrate (IMDUR) 120 MG 24 hr tablet Take 120 mg by mouth daily.    [provider]  Melatonin 10 MG CAPS Take 1 capsule by mouth at bedtime.     [provider]  metFORMIN (GLUCOPHAGE) 500 MG tablet Take 1,000 mg by mouth 2 (two) times daily. 09/22/19   [provider]  metoprolol succinate (TOPROL-XL) 100 MG 24 hr tablet at bedtime.    [provider]  metoprolol succinate (TOPROL-XL) 50  MG 24 hr tablet at bedtime. TAKE 1 TABLET (50 MG TOTAL) BY MOUTH TAKE WITH 100 MG TABLET OF METOPROLOL (TOTAL DOSE 150MG )    [provider]  Multiple Vitamins-Minerals (PRESERVISION AREDS 2+MULTI VIT PO) Take by mouth.    [provider]  nitroGLYCERIN (NITROSTAT) 0.4 MG SL tablet Place 0.4 mg under the tongue every 5 (five) minutes as needed for chest pain.     [provider]  NOVOLIN R 100 UNIT/ML injection Inject into the skin 3 (three) times daily before meals. Inject 0-0.025 mL (0-2.5 Units total) under the skin Three (3) times a day before meals. Insulin Sliding Scale: Blood Glucose - 151-200 mg/dL = 0.5 units; 201-250 mg/dL = 1 unit; 251-300 mg/dL = 1.5 units; 301-350 mg/dL= 2 units; 351-400 mg/dL = 2.5 units 10 mL 01/27/20   [provider]  omeprazole (PRILOSEC) 40 MG capsule Take 40 mg by mouth 2 (two) times daily.  07/19/19   [provider]  sertraline (ZOLOFT) 100 MG tablet Take 200 mg by mouth daily.     [provider]  tamsulosin (FLOMAX) 0.4 MG CAPS capsule Take 0.8 mg by mouth at bedtime.     [provider]    Physical Exam: Vitals:   05/06/21 1900 05/06/21 1915 05/06/21 1930 05/06/21 2000  BP: (!) 158/75  (!) 173/93 (!) 163/86  Pulse: 85 83 91 86  Resp: 20 (!) 29    Temp:      TempSrc:      SpO2: 98% 98% 97% 92%  Weight:      Height:       General: in acute respiratory distress HEENT:       Eyes: PERRL, EOMI, no scleral icterus.       ENT: No discharge from the ears and nose, no pharynx injection, no tonsillar enlargement.        Neck: No JVD, no bruit, no mass felt. Heme: No neck lymph node enlargement. Cardiac: S1/S2, RRR, No gallops or rubs. Respiratory: has coarse breathing sound bilaterally GI: Soft, nondistended, nontender, no rebound pain, no organomegaly, BS present. GU: No hematuria Ext: has trace leg edema bilaterally. 1+DP/PT pulse bilaterally. Musculoskeletal: No joint deformities, No joint redness or warmth, no limitation of ROM in spin. Skin: No rashes.  Neuro: Alert, oriented X3, cranial nerves II-XII grossly intact, moves all extremities normally. Psych: Patient is not psychotic, no suicidal or hemocidal ideation.  Labs on Admission: I have personally reviewed following labs and imaging studies  CBC: Recent Labs  Lab 05/05/21 1531  WBC 5.1  HGB 12.0*  HCT 36.5*  MCV 85.3  PLT 99*   Basic Metabolic Panel: Recent Labs  Lab 05/05/21 1531  NA 137  K 3.8  CL 99  CO2 25  GLUCOSE 168*  BUN 24*  CREATININE 1.57*  CALCIUM 8.8*   GFR: Estimated Creatinine Clearance: 37.2 mL/min (A) (by C-G formula based on SCr of 1.57 mg/dL (H)). Liver Function Tests: Recent Labs  Lab 05/06/21 0805  AST 18  ALT 11  ALKPHOS 60  BILITOT 1.1  PROT 6.4*  ALBUMIN 3.7   No results for input(s): LIPASE, AMYLASE in the last 168 hours. No  results for input(s): AMMONIA in the last 168 hours. Coagulation Profile: No results for input(s): INR, PROTIME in the last 168 hours. Cardiac Enzymes: No results for input(s): CKTOTAL, CKMB, CKMBINDEX, TROPONINI in the last 168 hours. BNP (last 3 results) No results for input(s): PROBNP in the last 8760 hours. HbA1C: Recent  Labs    05/06/21 0805  HGBA1C 7.1*   CBG: Recent Labs  Lab 05/06/21 0801 05/06/21 1139 05/06/21 1607 05/06/21 1900  GLUCAP 210* 209* 188* 124*   Lipid Profile: No results for input(s): CHOL, HDL, LDLCALC, TRIG, CHOLHDL, LDLDIRECT in the last 72 hours. Thyroid Function Tests: No results for input(s): TSH, T4TOTAL, FREET4, T3FREE, THYROIDAB in the last 72 hours. Anemia Panel: Recent Labs    05/06/21 0806  FERRITIN 104   Urine analysis:    Component Value Date/Time   COLORURINE YELLOW (A) 05/06/2021 0224   APPEARANCEUR CLEAR (A) 05/06/2021 0224   LABSPEC 1.013 05/06/2021 0224   PHURINE 7.0 05/06/2021 0224   GLUCOSEU NEGATIVE 05/06/2021 0224   HGBUR SMALL (A) 05/06/2021 0224   BILIRUBINUR NEGATIVE 05/06/2021 0224   KETONESUR 5 (A) 05/06/2021 0224   PROTEINUR >=300 (A) 05/06/2021 0224   NITRITE NEGATIVE 05/06/2021 0224   LEUKOCYTESUR NEGATIVE 05/06/2021 0224   Sepsis Labs: @LABRCNTIP (procalcitonin:4,lacticidven:4) ) Recent Results (from the past 240 hour(s))  Resp Panel by RT-PCR (Flu A&B, Covid) Nasopharyngeal Swab     Status: Abnormal   Collection Time: 05/06/21  5:27 AM   Specimen: Nasopharyngeal Swab; Nasopharyngeal(NP) swabs in vial transport medium  Result Value Ref Range Status   SARS Coronavirus 2 by RT PCR POSITIVE (A) NEGATIVE Final    Comment: RESULT CALLED TO, READ BACK BY AND VERIFIED WITH: ALYCIA BEVERLY @0646  05/06/21 MJU (NOTE) SARS-CoV-2 target nucleic acids are DETECTED.  The SARS-CoV-2 RNA is generally detectable in upper respiratory specimens during the acute phase of infection. Positive results are indicative of the  presence of the identified virus, but do not rule out bacterial infection or co-infection with other pathogens not detected by the test. Clinical correlation with patient history and other diagnostic information is necessary to determine patient infection status. The expected result is Negative.  Fact Sheet for Patients: EntrepreneurPulse.com.au  Fact Sheet for Healthcare Providers: IncredibleEmployment.be  This test is not yet approved or cleared by the [place] FDA and  has been authorized for detection and/or diagnosis of SARS-CoV-2 by FDA under an Emergency Use Authorization (EUA).  This EUA will remain in effect (meaning this test can be  used) for the duration of  the COVID-19 declaration under Section 564(b)(1) of the Act, 21 U.S.C. section 360bbb-3(b)(1), unless the authorization is terminated or revoked sooner.     Influenza A by PCR NEGATIVE NEGATIVE Final   Influenza B by PCR NEGATIVE NEGATIVE Final    Comment: (NOTE) The Xpert Xpress SARS-CoV-2/FLU/RSV plus assay is intended as an aid in the diagnosis of influenza from Nasopharyngeal swab specimens and should not be used as a sole basis for treatment. Nasal washings and aspirates are unacceptable for Xpert Xpress SARS-CoV-2/FLU/RSV testing.  Fact Sheet for Patients: EntrepreneurPulse.com.au  Fact Sheet for Healthcare Providers: IncredibleEmployment.be  This test is not yet approved or cleared by the [place] FDA and has been authorized for detection and/or diagnosis of SARS-CoV-2 by FDA under an Emergency Use Authorization (EUA). This EUA will remain in effect (meaning this test can be used) for the duration of the COVID-19 declaration under Section 564(b)(1) of the Act, 21 U.S.C. section 360bbb-3(b)(1), unless the authorization is terminated or revoked.  Performed at [facility], [address], [place], [place]  [address]      Radiological Exams on Admission: CT Abdomen Pelvis Wo Contrast  Result Date: 05/06/2021 CLINICAL DATA:  80 year old male with acute onset vomiting. EXAM: CT ABDOMEN AND PELVIS WITHOUT CONTRAST TECHNIQUE:  Multidetector CT imaging of the abdomen and pelvis was performed following the standard protocol without IV contrast. COMPARISON:  CT Abdomen and Pelvis 02/12/2020, and earlier. Chest CT 08/27/2019. FINDINGS: Lower chest: Chronic nodular lung disease, with frequent bulky calcified pulmonary nodules, and chronic coarse pleural calcifications in both costophrenic angles. Superimposed small new layering right pleural effusion, and trace effusion on the left. No cardiomegaly. No pericardial effusion. Hepatobiliary: Negative noncontrast liver. Possible gallbladder layering sludge on series 2, image 33. But no gallbladder distension or pericholecystic inflammation. Pancreas: Chronic dystrophic calcifications throughout the pancreatic head and uncinate appears stable since last year. No pancreatic inflammation is evident. Spleen: Chronic splenomegaly. Estimated splenic volume is 840 mL (normal splenic volume range 83 - 412 mL). But this has only mildly increased since 2017. No discrete splenic lesion. No perisplenic fluid. Adrenals/Urinary Tract: Adrenal glands are stable and negative. Right kidney appears nonobstructed and stable since 2017. No right hydroureter. Bulky 11 mm left lower pole calculus. Chronic asymmetric left renal pelvis appears stable since 2017. No left hydroureter. No acute pararenal or periureteral inflammation. Chronic mildly thick-walled urinary bladder with an anterior small bladder diverticulum unchanged on series 2, image 75. Chronic pelvic phleboliths. Stomach/Bowel: Negative large bowel aside from redundancy and occasional diverticula. The cecum is located on a lax mesentery in the anterior right upper quadrant. Normal appendix on series 2, image 48. Negative terminal ileum.  No dilated small bowel. Gas containing but otherwise negative stomach. Fluid in the duodenum. Small distal duodenal diverticulum without active inflammation on series 2, image 36. No free air or free fluid. Vascular/Lymphatic: Aortoiliac calcified atherosclerosis. Normal caliber abdominal aorta. Vascular patency is not evaluated in the absence of IV contrast. There are chronic, matted aortocaval lymph nodes in the lower retroperitoneum (series 2, image 49) which are stable since 2017. No other lymphadenopathy is evident on this noncontrast exam. Reproductive: Chronic prostatomegaly and small fat containing inguinal hernias. Other: No pelvic free fluid. Musculoskeletal: Chronic posterior left lower rib fractures. Chronic L1 compression fracture is stable since last year. No acute osseous abnormality identified. IMPRESSION: 1. A small layering right pleural effusion is new since last year. But otherwise visible extensive nodular, partially calcified chronic lung and pleural disease is stable. 2. No acute or inflammatory process identified in the non-contrast abdomen or pelvis. 3. Bulky left lower pole renal calculus is stable with no convincing obstructive uropathy. Chronic splenomegaly is nonspecific and only mildly progressed since 2017. And superimposed mild chronic retroperitoneal lymphadenopathy is stable since 2017. Chronic bladder wall thickening and small bladder diverticulum. Chronic prostatomegaly. Aortic Atherosclerosis (ICD10-I70.0). Electronically Signed   By: Genevie Ann M.D.   On: 05/06/2021 05:49   DG Chest 2 View  Result Date: 05/05/2021 CLINICAL DATA:  Status post fall. EXAM: CHEST - 2 VIEW COMPARISON:  August 30, 2020 FINDINGS: Multiple sternal wires are seen. Bilateral chronic appearing lung markings are noted with multiple calcified lung nodules of various sizes seen bilaterally. There is no evidence of acute infiltrate, pleural effusion or pneumothorax. The cardiac silhouette is within normal  limits. The visualized skeletal structures are unremarkable. IMPRESSION: 1. Evidence of prior median sternotomy with findings consistent with history of sarcoidosis. 2. No acute or active cardiopulmonary disease. Electronically Signed   By: Virgina Norfolk M.D.   On: 05/05/2021 21:31   CT HEAD WO CONTRAST (5MM)  Result Date: 05/05/2021 CLINICAL DATA:  Recent fall with headaches and neck pain, initial encounter EXAM: CT HEAD WITHOUT CONTRAST CT CERVICAL SPINE WITHOUT CONTRAST TECHNIQUE: Multidetector  CT imaging of the head and cervical spine was performed following the standard protocol without intravenous contrast. Multiplanar CT image reconstructions of the cervical spine were also generated. COMPARISON:  08/30/2020, CT of the head from earlier in the same day. FINDINGS: CT HEAD FINDINGS Brain: The area of increased attenuation adjacent to the left frontal cortex is no longer identified and felt to be artifactual in nature likely related to mild motion and beam hardening artifact. Mild atrophic changes are seen. No findings to suggest acute hemorrhage, acute infarction or space-occupying mass lesion is noted. Vascular: No hyperdense vessel or unexpected calcification. Skull: Normal. Negative for fracture or focal lesion. Sinuses/Orbits: No acute finding. Other: None. CT CERVICAL SPINE FINDINGS Alignment: Mild straightening of the normal cervical lordosis is noted. Skull base and vertebrae: 7 cervical segments are well visualized. Vertebral body height is well maintained. Mild osteophytic changes are noted from C4-C7. Mild anterolisthesis of C4 on C5 is seen. Facet hypertrophic changes are noted bilaterally. No acute fracture or acute facet abnormality is seen. Soft tissues and spinal canal: Surrounding soft tissue structures are within normal limits with the exception of a hypodense nodule within the right lobe of the thyroid measuring 19 mm. This is stable in appearance from the prior CT. Upper chest:  Visualized lung apices demonstrate biapical scarring. Right-sided pleural effusion is noted new from the prior exam. Chest x-ray is recommended for further evaluation. Other: None IMPRESSION: CT of the head: No acute intracranial abnormality noted. The previously seen area of suggested hemorrhage is felt to be artifactual in nature related to motion artifact. CT of the cervical spine: Multi level degenerative changes similar to that seen on prior exams. 1.9 cm right thyroid nodule. Recommend nonemergent thyroid US (ref: J Am Coll Radiol. 2015 Feb;12(2): 143-50). New right-sided pleural effusion. Chest x-ray is recommended for further evaluation. Electronically Signed   By: Inez Catalina M.D.   On: 05/05/2021 20:16   CT Head Wo Contrast  Result Date: 05/05/2021 CLINICAL DATA:  Trauma, fall EXAM: CT HEAD WITHOUT CONTRAST TECHNIQUE: Contiguous axial images were obtained from the base of the skull through the vertex without intravenous contrast. COMPARISON:  08/30/2020 FINDINGS: Brain: Motion artifacts are seen in the some of the images. Additional images were obtained.rrw in image 19 of series 3, there is 8 x 4 mm area of subtle increased density in the left frontal cortex. Area of this finding is not included in the repeated images. There is no significant focal mass effect. There is no demonstrable epidural or subdural fluid collection. Ventricles are prominent. There is no shift of midline structures. Cortical sulci are prominent. There is subtle decreased density in the periventricular white matter. Vascular: Unremarkable. Skull: No fracture is seen in calvarium. Sinuses/Orbits: Unremarkable. Other: None IMPRESSION: There is 8 mm subtle increased density in the left frontal cortex close to the inner table of calvarium. This may suggest small focus of petechial hemorrhage due to contusion. In view of motion artifacts, possibility an artifact is not excluded. Short-term follow-up CT or MRI in 2-3 hours may be  considered. Atrophy. Small-vessel disease. Electronically Signed   By: Elmer Picker M.D.   On: 05/05/2021 16:45   CT Cervical Spine Wo Contrast  Result Date: 05/05/2021 CLINICAL DATA:  Recent fall with headaches and neck pain, initial encounter EXAM: CT HEAD WITHOUT CONTRAST CT CERVICAL SPINE WITHOUT CONTRAST TECHNIQUE: Multidetector CT imaging of the head and cervical spine was performed following the standard protocol without intravenous contrast. Multiplanar CT image reconstructions  of the cervical spine were also generated. COMPARISON:  08/30/2020, CT of the head from earlier in the same day. FINDINGS: CT HEAD FINDINGS Brain: The area of increased attenuation adjacent to the left frontal cortex is no longer identified and felt to be artifactual in nature likely related to mild motion and beam hardening artifact. Mild atrophic changes are seen. No findings to suggest acute hemorrhage, acute infarction or space-occupying mass lesion is noted. Vascular: No hyperdense vessel or unexpected calcification. Skull: Normal. Negative for fracture or focal lesion. Sinuses/Orbits: No acute finding. Other: None. CT CERVICAL SPINE FINDINGS Alignment: Mild straightening of the normal cervical lordosis is noted. Skull base and vertebrae: 7 cervical segments are well visualized. Vertebral body height is well maintained. Mild osteophytic changes are noted from C4-C7. Mild anterolisthesis of C4 on C5 is seen. Facet hypertrophic changes are noted bilaterally. No acute fracture or acute facet abnormality is seen. Soft tissues and spinal canal: Surrounding soft tissue structures are within normal limits with the exception of a hypodense nodule within the right lobe of the thyroid measuring 19 mm. This is stable in appearance from the prior CT. Upper chest: Visualized lung apices demonstrate biapical scarring. Right-sided pleural effusion is noted new from the prior exam. Chest x-ray is recommended for further evaluation.  Other: None IMPRESSION: CT of the head: No acute intracranial abnormality noted. The previously seen area of suggested hemorrhage is felt to be artifactual in nature related to motion artifact. CT of the cervical spine: Multi level degenerative changes similar to that seen on prior exams. 1.9 cm right thyroid nodule. Recommend nonemergent thyroid US (ref: J Am Coll Radiol. 2015 Feb;12(2): 143-50). New right-sided pleural effusion. Chest x-ray is recommended for further evaluation. Electronically Signed   By: Inez Catalina M.D.   On: 05/05/2021 20:16   DG Chest Port 1 View  Result Date: 05/06/2021 CLINICAL DATA:  Shortness of breath EXAM: PORTABLE CHEST 1 VIEW COMPARISON:  05/05/2021 FINDINGS: 0430 hours. Interval progression of patchy bilateral airspace disease on a background of chronic interstitial change. Calcified lymph nodes are again noted in the mediastinum and hilar regions with calcified pleural/parenchymal lung nodules best appreciated at the bases. The cardiopericardial silhouette is within normal limits for size. Telemetry leads overlie the chest. IMPRESSION: Interval progression of patchy bilateral airspace disease suggesting pulmonary edema although diffuse infection cannot be excluded. Electronically Signed   By: Misty Stanley M.D.   On: 05/06/2021 04:57     EKG: I have personally reviewed.  Sinus rhythm, QTC 489, bifascicular block, early R wave progression, PVC, T wave inversion in lead III/aVF  Assessment/Plan Principal Problem:   Acute respiratory disease due to COVID-19 virus Active Problems:   CAD (coronary artery disease)   Type II diabetes mellitus with renal manifestations (HCC)   BPH (benign prostatic hyperplasia)   Thrombocytopenia (HCC)   Sepsis (HCC)   Hyperlipidemia   Hypertension   Elevated troponin   Chronic diastolic CHF (congestive heart failure) (HCC)   Depression   CKD (chronic kidney disease), stage IIIa   Thyroid nodule   Fall at home, initial  encounter   Sepsis due to acute respiratory disease due to COVID-19 virus: Patient meets criteria for sepsis with heart rate 97, RR 32.  Lactic acid is normal.  -will admit to PCU as inpt -continue BiPAP -Remdesivir per pharm -Solumedrol 40 mg bid -Bronchodilators -PRN Mucinex for cough -f/u Blood culture -Gentle IV fluid: 1L of NS -f/u inflammatory markers -Will ask the patient to maintain an  awake prone position for 16+ hours a day, if possible, with a minimum of 2-3 hours at a time -Will attempt to maintain euvolemia to a net negative fluid status -IF patient deteriorates, will consult PCCM and ID -consult TOC for possible SNF placement  CAD (coronary artery disease) and elevated trop: trop 17 --> 31 --> 31  --> 56.  No chest pain, likely due to demand ischemia -Trend troponin -Check A1c, FLP -Continue home Lipitor -Hold Plavix due to fall with injury -Imdur -prn NGT  Type II diabetes mellitus with renal manifestations [facility]): Recent A1c 9.5, poorly controlled.  Patient is taking Novolin, metformin and Lantus -Sliding scale insulin -Decrease Lantus dose from 38 to 20 unit daily  BPH (benign prostatic hyperplasia) -Flomax  Thrombocytopenia ([place]): This is chronic issue.  Platelet 99. -Follow-up with CBC  Hyperlipidemia -Lipitor  Hypertension -Metoprolol -IV hydralazine as needed  Chronic diastolic CHF (congestive heart failure) ([place]): 2D echo on 05/21/2018 showed EF of 60 to 65% with grade 2 diastolic dysfunction.  BNP is elevated 562, indicating possible fluid overload -Lasix to 40 g daily  Depression -Continue home medications  CKD (chronic kidney disease), stage IIIa: Close to baseline -Follow-up with BMP  Thyroid nodule -Follow-up with PCP  Fall: -pt/OT       DVT ppx: SCD Code Status: Full code Family Communication: I called her neighbor [name]. Disposition Plan: to be detemined Consults called:  none Admission status and Level of care:  Progressive:   as inpt        Status is: Inpatient  Remains inpatient appropriate because: Patient has multiple comorbidities, now presents with acute respiratory failure with hypoxia due to COVID-19 infection.  Patient also has fall, elevated troponin and sepsis.  His presentation is highly complicated.  Patient is at high risk of deteriorating given his older age, will need to be treated in hospital for due to history of          Date of Service 05/06/2021    [facility]   If 7PM-7AM, please contact night-coverage [email or web address] 05/06/2021, 8:19 PM

## 2021-05-06 NOTE — ED Notes (Signed)
Transportation requested  

## 2021-05-06 NOTE — ED Notes (Signed)
Pt did not eat significant amount of lunch. Tray removed, lights dimmed for comfort, bipap placed back on pt.

## 2021-05-06 NOTE — ED Notes (Signed)
Pt with emesis on gown and floor- pt states he just threw up and c/o nausea. Will medicate.

## 2021-05-06 NOTE — Progress Notes (Signed)
Remdesivir - Pharmacy Brief Note   O:  CXR: "Interval progression of patchy bilateral airspace disease suggesting pulmonary edema although diffuse infection cannot be excluded." SpO2: 93-98% on 2 L/min [place]   A/P:  Remdesivir 200 mg IVPB once followed by 100 mg IVPB daily x 4 days.   Renda Rolls, PharmD, MBA 05/06/2021 7:00 AM

## 2021-05-06 NOTE — ED Notes (Signed)
MD Niu notified of troponin of 56 at this time

## 2021-05-07 ENCOUNTER — Encounter: Payer: Self-pay | Admitting: Internal Medicine

## 2021-05-07 DIAGNOSIS — N1831 Chronic kidney disease, stage 3a: Secondary | ICD-10-CM | POA: Diagnosis not present

## 2021-05-07 DIAGNOSIS — J9601 Acute respiratory failure with hypoxia: Secondary | ICD-10-CM | POA: Diagnosis present

## 2021-05-07 DIAGNOSIS — I5033 Acute on chronic diastolic (congestive) heart failure: Secondary | ICD-10-CM

## 2021-05-07 DIAGNOSIS — U071 COVID-19: Secondary | ICD-10-CM | POA: Diagnosis not present

## 2021-05-07 LAB — LIPID PANEL
Cholesterol: 111 mg/dL (ref 0–200)
HDL: 30 mg/dL — ABNORMAL LOW (ref >40)
LDL Cholesterol: 69 mg/dL (ref 0–99)
Total CHOL/HDL Ratio: 3.7 RATIO
Triglycerides: 59 mg/dL (ref <150)
VLDL: 12 mg/dL (ref 0–40)

## 2021-05-07 LAB — COMPREHENSIVE METABOLIC PANEL
ALT: 10 U/L (ref 0–44)
AST: 16 U/L (ref 15–41)
Albumin: 3.7 g/dL (ref 3.5–5.0)
Alkaline Phosphatase: 55 U/L (ref 38–126)
Anion gap: 10 (ref 5–15)
BUN: 41 mg/dL — ABNORMAL HIGH (ref 8–23)
CO2: 26 mmol/L (ref 22–32)
Calcium: 8.8 mg/dL — ABNORMAL LOW (ref 8.9–10.3)
Chloride: 101 mmol/L (ref 98–111)
Creatinine, Ser: 1.7 mg/dL — ABNORMAL HIGH (ref 0.61–1.24)
GFR, Estimated: 40 mL/min — ABNORMAL LOW (ref >60)
Glucose, Bld: 227 mg/dL — ABNORMAL HIGH (ref 70–99)
Potassium: 4.3 mmol/L (ref 3.5–5.1)
Sodium: 137 mmol/L (ref 135–145)
Total Bilirubin: 1 mg/dL (ref 0.3–1.2)
Total Protein: 6.5 g/dL (ref 6.5–8.1)

## 2021-05-07 LAB — CBC WITH DIFFERENTIAL/PLATELET
Abs Immature Granulocytes: 0.02 10*3/uL (ref 0.00–0.07)
Basophils Absolute: 0 10*3/uL (ref 0.0–0.1)
Basophils Relative: 0 %
Eosinophils Absolute: 0 10*3/uL (ref 0.0–0.5)
Eosinophils Relative: 0 %
HCT: 34.5 % — ABNORMAL LOW (ref 39.0–52.0)
Hemoglobin: 11.5 g/dL — ABNORMAL LOW (ref 13.0–17.0)
Immature Granulocytes: 0 %
Lymphocytes Relative: 5 %
Lymphs Abs: 0.3 10*3/uL — ABNORMAL LOW (ref 0.7–4.0)
MCH: 28.3 pg (ref 26.0–34.0)
MCHC: 33.3 g/dL (ref 30.0–36.0)
MCV: 84.8 fL (ref 80.0–100.0)
Monocytes Absolute: 0.5 10*3/uL (ref 0.1–1.0)
Monocytes Relative: 8 %
Neutro Abs: 5.3 10*3/uL (ref 1.7–7.7)
Neutrophils Relative %: 87 %
Platelets: 88 10*3/uL — ABNORMAL LOW (ref 150–400)
RBC: 4.07 MIL/uL — ABNORMAL LOW (ref 4.22–5.81)
RDW: 14.8 % (ref 11.5–15.5)
WBC: 6 10*3/uL (ref 4.0–10.5)
nRBC: 0 % (ref 0.0–0.2)

## 2021-05-07 LAB — GLUCOSE, CAPILLARY
Glucose-Capillary: 220 mg/dL — ABNORMAL HIGH (ref 70–99)
Glucose-Capillary: 282 mg/dL — ABNORMAL HIGH (ref 70–99)
Glucose-Capillary: 365 mg/dL — ABNORMAL HIGH (ref 70–99)
Glucose-Capillary: 245 mg/dL — ABNORMAL HIGH (ref 70–99)

## 2021-05-07 LAB — URINE CULTURE: Culture: NO GROWTH

## 2021-05-07 LAB — C-REACTIVE PROTEIN: CRP: 15.2 mg/dL — ABNORMAL HIGH (ref <1.0)

## 2021-05-07 NOTE — Progress Notes (Signed)
PROGRESS NOTE  Peter Robertson FBP:[id number] DOB: 11/20/1940 DOA: 05/05/2021 PCP: Danae Orleans, MD  HPI/Recap of past 39 hours: -year-old male with past medical history of hypertension, diabetes mellitus, stage IIIa chronic kidney disease, diastolic CHF, CAD and dementia who presented to the emergency room on 11/17 night after a fall, hitting his head, but with no loss of consciousness.  He was noted to however be in acute respiratory distress and was positive for COVID.  He also look to be slightly volume overloaded.  Seen after arrival to floor.  Currently resting comfortably.  Assessment/Plan: Principal Problem:   Acute respiratory failure with hypoxia due to COVID-19 virus: Initially on BiPAP, able to be weaned down to 4 L by following day.  Continue nebulizers, steroids and remdesivir.  Patient does not meet criteria for sepsis given factors could be also related to CHF, therefore sepsis is ruled out. Active Problems:   CAD (coronary artery disease): Stable.    Type II diabetes mellitus with renal manifestations [facility]): Increase coverage given steroids.  CBGs have been in the low 200s.    BPH (benign prostatic hyperplasia): Continue Flomax    Senile dementia without behavioral disturbance: Monitor.   Hyperlipidemia: Continue Lipitor.    Hypertension: Should significantly improve as he better diuresis.    Elevated troponin: Likely secondary to CHF, no evidence of ACS. Acute on chronic diastolic CHF (congestive heart failure) ([place]): BNP of 560.  Last echo was December 2019 noting grade 2 diastolic dysfunction and a preserved ejection fraction.  IV Lasix started and patient has diuresed almost 1 L of fluid since.    Depression: Continue home medication.  AKI in the setting of CKD (chronic kidney disease), stage IIIa: Monitor, especially while getting diuretics.   Thyroid nodule: Incidentally noted, follow-up with PCP.    Fall at home, initial encounter: Physical therapy to  see.   Code Status: Full code  Family Communication: Left message for neighbor who is only contact listed  Disposition Plan: Hopefully patient will make good recovery and able to be discharged home  Consultants: None  Procedures: None  Antimicrobials: IV Remdisivir 11/18-present  DVT prophylaxis: SCDs  Level of care: Progressive   Objective: Vitals:   05/06/21 2245 05/07/21 0817  BP: (!) 161/84 (!) 166/81  Pulse: 92 94  Resp: 16 17  Temp: 98.8 F (37.1 C) 98.9 F (37.2 C)  SpO2: 99% 99%    Intake/Output Summary (Last 24 hours) at 05/07/2021 1039 Last data filed at 05/07/2021 0700 Gross per 24 hour  Intake 240 ml  Output 800 ml  Net -560 ml   Filed Weights   05/05/21 1528 05/06/21 2245  Weight: 82.6 kg 80.9 kg   Body mass index is 28.79 kg/m.  Exam:  General: Currently resting comfortably, no acute distress HEENT: Normocephalic and atraumatic, mucous membranes are slightly dry Cardiovascular: Regular rate and rhythm, S1-S2 Respiratory: Decreased breath sounds throughout Abdomen: Soft, nontender, nondistended, positive bowel sounds Musculoskeletal: No clubbing or cyanosis or edema Skin: No skin breaks, tears or lesions Psychiatry: Appropriate, no evidence of psychoses Neurology: No focal deficits   Data Reviewed: CBC: Recent Labs  Lab 05/05/21 1531 05/07/21 0631  WBC 5.1 6.0  NEUTROABS  --  5.3  HGB 12.0* 11.5*  HCT 36.5* 34.5*  MCV 85.3 84.8  PLT 99* 88*   Basic Metabolic Panel: Recent Labs  Lab 05/05/21 1531 05/07/21 0631  NA 137 137  K 3.8 4.3  CL 99 101  CO2 25 26  GLUCOSE  168* 227*  BUN 24* 41*  CREATININE 1.57* 1.70*  CALCIUM 8.8* 8.8*   GFR: Estimated Creatinine Clearance: 34 mL/min (A) (by C-G formula based on SCr of 1.7 mg/dL (H)). Liver Function Tests: Recent Labs  Lab 05/06/21 0805 05/07/21 0631  AST 18 16  ALT 11 10  ALKPHOS 60 55  BILITOT 1.1 1.0  PROT 6.4* 6.5  ALBUMIN 3.7 3.7   No results for input(s):  LIPASE, AMYLASE in the last 168 hours. No results for input(s): AMMONIA in the last 168 hours. Coagulation Profile: No results for input(s): INR, PROTIME in the last 168 hours. Cardiac Enzymes: No results for input(s): CKTOTAL, CKMB, CKMBINDEX, TROPONINI in the last 168 hours. BNP (last 3 results) No results for input(s): PROBNP in the last 8760 hours. HbA1C: Recent Labs    05/06/21 0805  HGBA1C 7.1*   CBG: Recent Labs  Lab 05/06/21 0801 05/06/21 1139 05/06/21 1607 05/06/21 1900 05/07/21 0819  GLUCAP 210* 209* 188* 124* 220*   Lipid Profile: Recent Labs    05/07/21 0631  CHOL 111  HDL 30*  LDLCALC 69  TRIG 59  CHOLHDL 3.7   Thyroid Function Tests: No results for input(s): TSH, T4TOTAL, FREET4, T3FREE, THYROIDAB in the last 72 hours. Anemia Panel: Recent Labs    05/06/21 0806  FERRITIN 104   Urine analysis:    Component Value Date/Time   COLORURINE YELLOW (A) 05/06/2021 0224   APPEARANCEUR CLEAR (A) 05/06/2021 0224   LABSPEC 1.013 05/06/2021 0224   PHURINE 7.0 05/06/2021 0224   GLUCOSEU NEGATIVE 05/06/2021 0224   HGBUR SMALL (A) 05/06/2021 0224   BILIRUBINUR NEGATIVE 05/06/2021 0224   KETONESUR 5 (A) 05/06/2021 0224   PROTEINUR >=300 (A) 05/06/2021 0224   NITRITE NEGATIVE 05/06/2021 0224   LEUKOCYTESUR NEGATIVE 05/06/2021 0224   Sepsis Labs: @LABRCNTIP (procalcitonin:4,lacticidven:4)  ) Recent Results (from the past 240 hour(s))  Urine Culture     Status: None   Collection Time: 05/06/21  2:24 AM   Specimen: Urine, Random  Result Value Ref Range Status   Specimen Description   Final    URINE, RANDOM Performed at [facility], [address], [place], [place] [address]    Special Requests   Final    NONE Performed at [facility], [address], [place], [place] [address]    Culture   Final    NO GROWTH Performed at [facility], [place] [address], [place],  [address]    Report Status 05/07/2021 FINAL  Final   Resp Panel by RT-PCR (Flu A&B, Covid) Nasopharyngeal Swab     Status: Abnormal   Collection Time: 05/06/21  5:27 AM   Specimen: Nasopharyngeal Swab; Nasopharyngeal(NP) swabs in vial transport medium  Result Value Ref Range Status   SARS Coronavirus 2 by RT PCR POSITIVE (A) NEGATIVE Final    Comment: RESULT CALLED TO, READ BACK BY AND VERIFIED WITH: ALYCIA BEVERLY @0646  05/06/21 MJU (NOTE) SARS-CoV-2 target nucleic acids are DETECTED.  The SARS-CoV-2 RNA is generally detectable in upper respiratory specimens during the acute phase of infection. Positive results are indicative of the presence of the identified virus, but do not rule out bacterial infection or co-infection with other pathogens not detected by the test. Clinical correlation with patient history and other diagnostic information is necessary to determine patient infection status. The expected result is Negative.  Fact Sheet for Patients: EntrepreneurPulse.com.au  Fact Sheet for Healthcare Providers: IncredibleEmployment.be  This test is not yet approved or cleared by the  [place] Architectural technologist and  has been authorized for detection and/or diagnosis of SARS-CoV-2 by FDA under an Print production planner (EUA).  This EUA will remain in effect (meaning this test can be  used) for the duration of  the COVID-19 declaration under Section 564(b)(1) of the Act, 21 U.S.C. section 360bbb-3(b)(1), unless the authorization is terminated or revoked sooner.     Influenza A by PCR NEGATIVE NEGATIVE Final   Influenza B by PCR NEGATIVE NEGATIVE Final    Comment: (NOTE) The Xpert Xpress SARS-CoV-2/FLU/RSV plus assay is intended as an aid in the diagnosis of influenza from Nasopharyngeal swab specimens and should not be used as a sole basis for treatment. Nasal washings and aspirates are unacceptable for Xpert Xpress SARS-CoV-2/FLU/RSV testing.  Fact Sheet for  Patients: EntrepreneurPulse.com.au  Fact Sheet for Healthcare Providers: IncredibleEmployment.be  This test is not yet approved or cleared by the [place] FDA and has been authorized for detection and/or diagnosis of SARS-CoV-2 by FDA under an Emergency Use Authorization (EUA). This EUA will remain in effect (meaning this test can be used) for the duration of the COVID-19 declaration under Section 564(b)(1) of the Act, 21 U.S.C. section 360bbb-3(b)(1), unless the authorization is terminated or revoked.  Performed at [facility], [address] [place], [place] [address]   Culture, blood (routine x 2)     Status: None (Preliminary result)   Collection Time: 05/06/21  5:27 AM   Specimen: BLOOD  Result Value Ref Range Status   Specimen Description BLOOD RIGHT ASSIST CONTROL  Final   Special Requests   Final    BOTTLES DRAWN AEROBIC AND ANAEROBIC Blood Culture results may not be optimal due to an excessive volume of blood received in culture bottles   Culture   Final    NO GROWTH 1 DAY Performed at [facility], [address], [place], [place] [address]    Report Status PENDING  Incomplete  Culture, blood (routine x 2)     Status: None (Preliminary result)   Collection Time: 05/06/21  5:27 AM   Specimen: BLOOD  Result Value Ref Range Status   Specimen Description BLOOD LEFT FOREARM  Final   Special Requests   Final    BOTTLES DRAWN AEROBIC AND ANAEROBIC Blood Culture results may not be optimal due to an excessive volume of blood received in culture bottles   Culture   Final    NO GROWTH 1 DAY Performed at [facility], [address] [place], [place] [address]    Report Status PENDING  Incomplete      Studies: No results found.  Scheduled Meds:  cholecalciferol  1,000 Units Oral Daily   furosemide  40 mg Intravenous Daily   gabapentin  100 mg Oral QHS   insulin aspart  0-5 Units Subcutaneous QHS    insulin aspart  0-9 Units Subcutaneous TID WC   insulin glargine-yfgn  29 Units Subcutaneous QHS   Ipratropium-Albuterol  2 puff Inhalation Q6H   methylPREDNISolone (SOLU-MEDROL) injection  40 mg Intravenous Q12H   metoprolol succinate  50 mg Oral Daily   multivitamin-lutein  1 capsule Oral BID   pantoprazole  40 mg Oral Daily   sertraline  200 mg Oral q AM   tamsulosin  0.8 mg Oral QHS   vitamin B-12  1,000 mcg Oral Daily    Continuous Infusions:  remdesivir 100 mg in NS 100 mL 100 mg (05/07/21 0931)     LOS: 1 day  Annita Brod, MD [facility]   05/07/2021, 10:39 AM

## 2021-05-07 NOTE — Evaluation (Signed)
Physical Therapy Evaluation Patient Details Name: Peter Robertson MRN: [id number] DOB: November 23, 1940 Today's Date: 05/07/2021  History of Present Illness  Pt admitted for ARF secondary to covid with complaints of falls and SOB symptoms. History includes HTN, HLD, DM, GERD, and MI. Pt A&O x 2.  Clinical Impression  Pt is a pleasant 80 year old male who was admitted for ARF secondary to covid. Pt performs bed mobility, transfers, and ambulation with min assist and RW. Fatigues quickly with exertion on 3-4L of O2. Sats WNL. Pt demonstrates deficits with strength/mobility/endurance. Pt is high falls risk and unsteady with all ambulation. Educated on continued use of RW for all exertion. Currently not at baseline level. Needs more support at home for safe discharge to previous living conditions. Would benefit from skilled PT to address above deficits and promote optimal return to PLOF; recommend transition to STR upon discharge from acute hospitalization.      Recommendations for follow up therapy are one component of a multi-disciplinary discharge planning process, led by the attending physician.  Recommendations may be updated based on patient status, additional functional criteria and insurance authorization.  Follow Up Recommendations Skilled nursing-short term rehab (<3 hours/day)    Assistance Recommended at Discharge Frequent or constant Supervision/Assistance  Functional Status Assessment Patient has had a recent decline in their functional status and demonstrates the ability to make significant improvements in function in a reasonable and predictable amount of time.  Equipment Recommendations   (TBD)    Recommendations for Other Services       Precautions / Restrictions Precautions Precautions: Fall Restrictions Weight Bearing Restrictions: No      Mobility  Bed Mobility Overal bed mobility: Needs Assistance Bed Mobility: Supine to Sit     Supine to sit: Min assist     General  bed mobility comments: needs cues for initiation. Once seated at EOB, upright posture noted. All mobility performed on 4L of O2 with sats at 98%.    Transfers Overall transfer level: Needs assistance Equipment used: Rolling walker (2 wheels) Transfers: Bed to chair/wheelchair/BSC     Step pivot transfers: Min assist       General transfer comment: needs cues for sequencing. O2 decreased to 3L with exertion with sats at 97%. RW used    Ambulation/Gait Ambulation/Gait assistance: Herbalist (Feet): 3 Feet Assistive device: Rolling walker (2 wheels) Gait Pattern/deviations: Step-to pattern       General Gait Details: reports he is unable to further ambulate due to increased fatigue and SOB with ambulation to recliner. High falls risk.  Stairs            Wheelchair Mobility    Modified Rankin (Stroke Patients Only)       Balance Overall balance assessment: History of Falls;Needs assistance Sitting-balance support: Feet supported Sitting balance-Leahy Scale: Good     Standing balance support: Bilateral upper extremity supported Standing balance-Leahy Scale: Fair                               Pertinent Vitals/Pain Pain Assessment: No/denies pain    Home Living Family/patient expects to be discharged to:: Private residence Living Arrangements: Alone   Type of Home: House         Home Layout: One level Home Equipment: Conservation officer, nature (2 wheels) Additional Comments: pt is poor historian; unsure of accurate history.    Prior Function Prior Level of Function : Patient poor historian/Family  not available;Independent/Modified Independent             Mobility Comments: reports he doesn't typically use AD and is typically ambulatory in home and community. Depends on neighbor for transportation       Hand Dominance        Extremity/Trunk Assessment   Upper Extremity Assessment Upper Extremity Assessment: Generalized  weakness (B UE grossly 4/5)    Lower Extremity Assessment Lower Extremity Assessment: Generalized weakness (B LE grossly 4/5)       Communication   Communication: No difficulties  Cognition Arousal/Alertness: Awake/alert Behavior During Therapy: WFL for tasks assessed/performed Overall Cognitive Status: No family/caregiver present to determine baseline cognitive functioning                                 General Comments: confused on date/season of year. THought he was at [place]      Exercises Other Exercises Other Exercises: supine/seated ther-ex performed on B LE including AP, LAQ, and alt marching. 10 reps with monitoring O2 sats on 3L of O2. Supervision given. Quick fatigue with exertion.   Assessment/Plan    PT Assessment Patient needs continued PT services  PT Problem List Decreased strength;Decreased activity tolerance;Decreased balance;Decreased mobility;Cardiopulmonary status limiting activity       PT Treatment Interventions Gait training;Therapeutic exercise    PT Goals (Current goals can be found in the Care Plan section)  Acute Rehab PT Goals Patient Stated Goal: to get stronger PT Goal Formulation: With patient Time For Goal Achievement: 05/21/21 Potential to Achieve Goals: Good    Frequency Min 2X/week   Barriers to discharge        Co-evaluation               AM-PAC PT "6 Clicks" Mobility  Outcome Measure Help needed turning from your back to your side while in a flat bed without using bedrails?: A Little Help needed moving from lying on your back to sitting on the side of a flat bed without using bedrails?: A Little Help needed moving to and from a bed to a chair (including a wheelchair)?: A Little Help needed standing up from a chair using your arms (e.g., wheelchair or bedside chair)?: A Little Help needed to walk in hospital room?: A Little Help needed climbing 3-5 steps with a railing? : A  Little 6 Click Score: 18    End of Session Equipment Utilized During Treatment: Oxygen Activity Tolerance: Patient tolerated treatment well Patient left: in chair;with chair alarm set Nurse Communication: Mobility status PT Visit Diagnosis: Muscle weakness (generalized) (M62.81);Difficulty in walking, not elsewhere classified (R26.2)    Time: 0960-4540 PT Time Calculation (min) (ACUTE ONLY): 15 min   Charges:   PT Evaluation $PT Eval Low Complexity: 1 Low PT Treatments $Therapeutic Exercise: 8-22 mins        Greggory Stallion, PT, DPT [phone]   [place] Truex 05/07/2021, 3:27 PM

## 2021-05-08 DIAGNOSIS — N1831 Chronic kidney disease, stage 3a: Secondary | ICD-10-CM | POA: Diagnosis not present

## 2021-05-08 DIAGNOSIS — F0390 Unspecified dementia without behavioral disturbance: Secondary | ICD-10-CM | POA: Diagnosis not present

## 2021-05-08 DIAGNOSIS — I5033 Acute on chronic diastolic (congestive) heart failure: Secondary | ICD-10-CM | POA: Diagnosis not present

## 2021-05-08 DIAGNOSIS — J9601 Acute respiratory failure with hypoxia: Secondary | ICD-10-CM | POA: Diagnosis not present

## 2021-05-08 LAB — GLUCOSE, CAPILLARY
Glucose-Capillary: 205 mg/dL — ABNORMAL HIGH (ref 70–99)
Glucose-Capillary: 269 mg/dL — ABNORMAL HIGH (ref 70–99)
Glucose-Capillary: 397 mg/dL — ABNORMAL HIGH (ref 70–99)
Glucose-Capillary: 419 mg/dL — ABNORMAL HIGH (ref 70–99)
Glucose-Capillary: 396 mg/dL — ABNORMAL HIGH (ref 70–99)

## 2021-05-08 LAB — CBC
HCT: 35.9 % — ABNORMAL LOW (ref 39.0–52.0)
Hemoglobin: 12 g/dL — ABNORMAL LOW (ref 13.0–17.0)
MCH: 27.9 pg (ref 26.0–34.0)
MCHC: 33.4 g/dL (ref 30.0–36.0)
MCV: 83.5 fL (ref 80.0–100.0)
Platelets: 100 10*3/uL — ABNORMAL LOW (ref 150–400)
RBC: 4.3 MIL/uL (ref 4.22–5.81)
RDW: 14.7 % (ref 11.5–15.5)
WBC: 6.7 10*3/uL (ref 4.0–10.5)
nRBC: 0 % (ref 0.0–0.2)

## 2021-05-08 LAB — BASIC METABOLIC PANEL
Anion gap: 7 (ref 5–15)
BUN: 54 mg/dL — ABNORMAL HIGH (ref 8–23)
CO2: 29 mmol/L (ref 22–32)
Calcium: 8.7 mg/dL — ABNORMAL LOW (ref 8.9–10.3)
Chloride: 97 mmol/L — ABNORMAL LOW (ref 98–111)
Creatinine, Ser: 1.7 mg/dL — ABNORMAL HIGH (ref 0.61–1.24)
GFR, Estimated: 40 mL/min — ABNORMAL LOW (ref >60)
Glucose, Bld: 191 mg/dL — ABNORMAL HIGH (ref 70–99)
Potassium: 5.1 mmol/L (ref 3.5–5.1)
Sodium: 133 mmol/L — ABNORMAL LOW (ref 135–145)

## 2021-05-08 LAB — C-REACTIVE PROTEIN: CRP: 9.1 mg/dL — ABNORMAL HIGH (ref <1.0)

## 2021-05-08 MED ORDER — INSULIN ASPART 100 UNIT/ML IJ SOLN
20.0000 [IU] | Freq: Once | INTRAMUSCULAR | Status: AC
  Administered 2021-05-08: 20 [IU] via SUBCUTANEOUS
  Filled 2021-05-08: qty 1

## 2021-05-08 NOTE — Evaluation (Signed)
Occupational Therapy Evaluation Patient Details Name: Peter Robertson MRN: [id number] DOB: 12/22/1940 Today's Date: 05/08/2021   History of Present Illness Pt admitted for ARF secondary to covid with complaints of falls and SOB symptoms. History includes HTN, HLD, DM, GERD, and MI.   Clinical Impression   Mr. Poehler presents with generalized weakness, limited endurance, and impaired balance. Pt lives alone, receiving assistance with driving, shopping, and general household chores from his neighbor. He reports being IND in dressing, bathing, toileting, cooking, cleaning, although states that he has had "a lot" of falls in the previous 12 months. He reports that he uses a rollator for ambulation and is aware of when he needs to stop and rest. He states that he believes most falls have happened because his L ankle "gives out." During today's session, pt is able to repeat several sit<>stand transfers w/o physical assistance. He performs LE therex in standing, UE therex in sitting while keeping O2 levels 97% or higher while on 2L O2 [place], not experiencing any signs of SOB, denying pain. Pt does demonstrate impaired balance in standing, including 3-4 overt LOB episodes from which he is able to self-recover while using RW. Recommend ongoing OT while hospitalized, with DC to SNF to improve endurance, strength, balance and increase safety prior to returning to home setting. Pt in agreement.     Recommendations for follow up therapy are one component of a multi-disciplinary discharge planning process, led by the attending physician.  Recommendations may be updated based on patient status, additional functional criteria and insurance authorization.   Follow Up Recommendations  Skilled nursing-short term rehab (<3 hours/day)    Assistance Recommended at Discharge Intermittent Supervision/Assistance  Functional Status Assessment  Patient has had a recent decline in their functional status and demonstrates the  ability to make significant improvements in function in a reasonable and predictable amount of time.  Equipment Recommendations  None recommended by OT    Recommendations for Other Services       Precautions / Restrictions Precautions Precautions: Fall Restrictions Weight Bearing Restrictions: No      Mobility Bed Mobility Overal bed mobility: Needs Assistance             General bed mobility comments: received, left in recliner    Transfers Overall transfer level: Needs assistance Equipment used: Rolling walker (2 wheels) Transfers: Sit to/from Stand Sit to Stand: Min guard                  Balance Overall balance assessment: History of Falls;Needs assistance Sitting-balance support: Feet supported Sitting balance-Leahy Scale: Good     Standing balance support: Bilateral upper extremity supported Standing balance-Leahy Scale: Fair Standing balance comment: several small LOB episodes, from which pt is able to self-correct                           ADL either performed or assessed with clinical judgement   ADL Overall ADL's : Needs assistance/impaired                                             Vision         Perception     Praxis      Pertinent Vitals/Pain Pain Assessment: No/denies pain     Hand Dominance Right   Extremity/Trunk Assessment Upper Extremity Assessment Upper Extremity  Assessment: Overall WFL for tasks assessed   Lower Extremity Assessment Lower Extremity Assessment: Overall WFL for tasks assessed       Communication Communication Communication: No difficulties   Cognition Arousal/Alertness: Awake/alert Behavior During Therapy: WFL for tasks assessed/performed Overall Cognitive Status: No family/caregiver present to determine baseline cognitive functioning                                 General Comments: able to state that he is at hospital, cannot name it. Unable to  provide month or year     General Comments       Exercises Other Exercises Other Exercises: Seated UB and LB therex. Sit<>stand transfers x 3, standing balance tolerance, discuss re: DC recs   Shoulder Instructions      Home Living Family/patient expects to be discharged to:: Private residence Living Arrangements: Alone Available Help at Discharge: Neighbor Type of Home: House Home Access: Stairs to enter [employer] of Steps: 2   Ranger: One level     Bathroom Shower/Tub: Teacher, early years/pre: [place]: Conservation officer, nature (2 wheels);Rollator (4 wheels)          Prior Functioning/Environment Prior Level of Function : History of Falls (last six months);Independent/Modified Independent             Mobility Comments: Reports he uses rollator for ambulation ADLs Comments: Pt lives alone, manages ADLs/IADLs largely with Mod I; neighbor assists with transport, shopping        OT Problem List: Decreased strength;Impaired balance (sitting and/or standing);Decreased safety awareness;Decreased activity tolerance      OT Treatment/Interventions: Self-care/ADL training;DME and/or AE instruction;Therapeutic activities;Balance training;Therapeutic exercise;Energy conservation;Patient/family education    OT Goals(Current goals can be found in the care plan section) Acute Rehab OT Goals Patient Stated Goal: to not fall at home OT Goal Formulation: With patient Time For Goal Achievement: 05/22/21 Potential to Achieve Goals: Good ADL Goals Pt Will Perform Lower Body Dressing: with modified independence;sit to/from stand;sitting/lateral leans Pt Will Transfer to Toilet: with modified independence;stand pivot transfer;regular height toilet (using LRAD) Pt/caregiver will Perform Home Exercise Program: Right Upper extremity;Left upper extremity;Increased strength;Increased ROM (to increase strength and endurance)  OT Frequency: Min  2X/week   Barriers to D/C: Decreased caregiver support  Pt living alone, receiving asst from neighbor; no formal/scheduled caregivers; frequent falls       Co-evaluation              AM-PAC OT "6 Clicks" Daily Activity     Outcome Measure Help from another person eating meals?: None Help from another person taking care of personal grooming?: A Little Help from another person toileting, which includes using toliet, bedpan, or urinal?: A Little Help from another person bathing (including washing, rinsing, drying)?: A Little Help from another person to put on and taking off regular upper body clothing?: A Little Help from another person to put on and taking off regular lower body clothing?: A Little 6 Click Score: 19   End of Session Equipment Utilized During Treatment: Rolling walker (2 wheels)  Activity Tolerance: Patient tolerated treatment well Patient left: in chair;with call bell/phone within reach;with chair alarm set  OT Visit Diagnosis: Unsteadiness on feet (R26.81);History of falling (Z91.81);Muscle weakness (generalized) (M62.81)                Time: 8182-9937 OT Time Calculation (min): 14 min Charges:  OT General Charges $OT Visit: 1 Visit OT Evaluation $OT Eval Low Complexity: 1 Low OT Treatments $Self Care/Home Management : 8-22 mins Josiah Lobo, PhD, MS, OTR/L 05/08/21, 2:37 PM

## 2021-05-08 NOTE — TOC CM/SW Note (Signed)
Per RN, patient AOx3. Only confused about year. Tried calling in room to discuss SNF recommendation but no answer. Will try again later.  Peter Robertson, [place]

## 2021-05-08 NOTE — Progress Notes (Signed)
PROGRESS NOTE  Peter Robertson UUV:[id number] DOB: 1940/12/15 DOA: 05/05/2021 PCP: Danae Orleans, MD  HPI/Recap of past 2 hours: -year-old male with past medical history of hypertension, diabetes mellitus, stage IIIa chronic kidney disease, diastolic CHF, CAD and dementia who presented to the emergency room on 11/17 night after a fall, hitting his head, but with no loss of consciousness.  He was noted to however be in acute respiratory distress and was positive for COVID.  He also look to be slightly volume overloaded.   Overnight, hypoxia improving.  Seen by PT and OT who are recommending short-term skilled nursing.  Patient okay, in good spirits, no acute distress, breathing a little easier.  Much more alert today.  Assessment/Plan: Principal Problem:   Acute respiratory failure with hypoxia due to COVID-19 virus: Initially on BiPAP, able to be weaned down to 4 L by following day, currently down to 2 L..  Continue nebulizers, steroids and remdesivir.  Patient does not meet criteria for sepsis given factors could be also related to CHF, therefore sepsis is ruled out. Active Problems:   CAD (coronary artery disease): Stable.    Type II diabetes mellitus with renal manifestations [facility]): Increase coverage given steroids.  CBGs have been in the low 200s.    BPH (benign prostatic hyperplasia): Continue Flomax    Senile dementia without behavioral disturbance: Monitor.  Patient is more alert and oriented x2, in no acute distress   Hyperlipidemia: Continue Lipitor.    Hypertension: Should significantly improve as he better diuresis.    Elevated troponin: Likely secondary to CHF, no evidence of ACS. Acute on chronic diastolic CHF (congestive heart failure) ([place]): BNP of 560.  Last echo was December 2019 noting grade 2 diastolic dysfunction and a preserved ejection fraction.  IV Lasix started and patient has diuresed over 3.5 L since admission.    Depression: Continue home medication.  AKI  in the setting of CKD (chronic kidney disease), stage IIIa: Monitor, especially while getting diuretics.   Thyroid nodule: Incidentally noted, follow-up with PCP.    Fall at home, initial encounter: We will need skilled nursing   Code Status: Full code  Family Communication: Left message for neighbor who is only contact listed  Disposition Plan: Needs skilled nursing although with COVID, he will need to be here for at least another week.  Will discuss with case management  Consultants: None  Procedures: None  Antimicrobials: IV Remdisivir 11/18-present  DVT prophylaxis: SCDs  Level of care: Telemetry Medical   Objective: Vitals:   05/08/21 0456 05/08/21 0823  BP: 132/65 140/79  Pulse: 63 66  Resp: 16   Temp: 97.7 F (36.5 C) 97.8 F (36.6 C)  SpO2: 99% 99%    Intake/Output Summary (Last 24 hours) at 05/08/2021 1024 Last data filed at 05/08/2021 0824 Gross per 24 hour  Intake 1790 ml  Output 2725 ml  Net -935 ml    Filed Weights   05/05/21 1528 05/06/21 2245  Weight: 82.6 kg 80.9 kg   Body mass index is 28.79 kg/m.  Exam:  General: Alert and oriented x2, no acute distress HEENT: Normocephalic and atraumatic, mucous membranes are slightly dry Cardiovascular: Regular rate and rhythm, S1-S2 Respiratory: Decreased breath sounds throughout, no crackles or rales Abdomen: Soft, nontender, nondistended, positive bowel sounds Musculoskeletal: No clubbing or cyanosis or edema Skin: No skin breaks, tears or lesions Psychiatry: Appropriate, no evidence of psychoses Neurology: No focal deficits   Data Reviewed: CBC: Recent Labs  Lab 05/05/21 1531 05/07/21  0631 05/08/21 0436  WBC 5.1 6.0 6.7  NEUTROABS  --  5.3  --   HGB 12.0* 11.5* 12.0*  HCT 36.5* 34.5* 35.9*  MCV 85.3 84.8 83.5  PLT 99* 88* 100*    Basic Metabolic Panel: Recent Labs  Lab 05/05/21 1531 05/07/21 0631 05/08/21 0436  NA 137 137 133*  K 3.8 4.3 5.1  CL 99 101 97*  CO2 25 26 29    GLUCOSE 168* 227* 191*  BUN 24* 41* 54*  CREATININE 1.57* 1.70* 1.70*  CALCIUM 8.8* 8.8* 8.7*    GFR: Estimated Creatinine Clearance: 34 mL/min (A) (by C-G formula based on SCr of 1.7 mg/dL (H)). Liver Function Tests: Recent Labs  Lab 05/06/21 0805 05/07/21 0631  AST 18 16  ALT 11 10  ALKPHOS 60 55  BILITOT 1.1 1.0  PROT 6.4* 6.5  ALBUMIN 3.7 3.7    No results for input(s): LIPASE, AMYLASE in the last 168 hours. No results for input(s): AMMONIA in the last 168 hours. Coagulation Profile: No results for input(s): INR, PROTIME in the last 168 hours. Cardiac Enzymes: No results for input(s): CKTOTAL, CKMB, CKMBINDEX, TROPONINI in the last 168 hours. BNP (last 3 results) No results for input(s): PROBNP in the last 8760 hours. HbA1C: Recent Labs    05/06/21 0805  HGBA1C 7.1*    CBG: Recent Labs  Lab 05/07/21 0819 05/07/21 1202 05/07/21 1654 05/07/21 2020 05/08/21 0822  GLUCAP 220* 282* 365* 245* 205*    Lipid Profile: Recent Labs    05/07/21 0631  CHOL 111  HDL 30*  LDLCALC 69  TRIG 59  CHOLHDL 3.7    Thyroid Function Tests: No results for input(s): TSH, T4TOTAL, FREET4, T3FREE, THYROIDAB in the last 72 hours. Anemia Panel: Recent Labs    05/06/21 0806  FERRITIN 104    Urine analysis:    Component Value Date/Time   COLORURINE YELLOW (A) 05/06/2021 0224   APPEARANCEUR CLEAR (A) 05/06/2021 0224   LABSPEC 1.013 05/06/2021 0224   PHURINE 7.0 05/06/2021 0224   GLUCOSEU NEGATIVE 05/06/2021 0224   HGBUR SMALL (A) 05/06/2021 0224   BILIRUBINUR NEGATIVE 05/06/2021 0224   KETONESUR 5 (A) 05/06/2021 0224   PROTEINUR >=300 (A) 05/06/2021 0224   NITRITE NEGATIVE 05/06/2021 0224   LEUKOCYTESUR NEGATIVE 05/06/2021 0224   Sepsis Labs: @LABRCNTIP (procalcitonin:4,lacticidven:4)  ) Recent Results (from the past 240 hour(s))  Urine Culture     Status: None   Collection Time: 05/06/21  2:24 AM   Specimen: Urine, Random  Result Value Ref Range Status    Specimen Description   Final    URINE, RANDOM Performed at [facility], [address] [place], [place] [address]    Special Requests   Final    NONE Performed at [facility], [address] [place], [place] [address]    Culture   Final    NO GROWTH Performed at [facility], [place] [address] [place], [place] [address]    Report Status 05/07/2021 FINAL  Final  Resp Panel by RT-PCR (Flu A&B, Covid) Nasopharyngeal Swab     Status: Abnormal   Collection Time: 05/06/21  5:27 AM   Specimen: Nasopharyngeal Swab; Nasopharyngeal(NP) swabs in vial transport medium  Result Value Ref Range Status   SARS Coronavirus 2 by RT PCR POSITIVE (A) NEGATIVE Final    Comment: RESULT CALLED TO, READ BACK BY AND VERIFIED WITH: ALYCIA BEVERLY @0646  05/06/21 MJU (NOTE) SARS-CoV-2 target nucleic acids are DETECTED.  The SARS-CoV-2 RNA is  generally detectable in upper respiratory specimens during the acute phase of infection. Positive results are indicative of the presence of the identified virus, but do not rule out bacterial infection or co-infection with other pathogens not detected by the test. Clinical correlation with patient history and other diagnostic information is necessary to determine patient infection status. The expected result is Negative.  Fact Sheet for Patients: EntrepreneurPulse.com.au  Fact Sheet for Healthcare Providers: IncredibleEmployment.be  This test is not yet approved or cleared by the [place] FDA and  has been authorized for detection and/or diagnosis of SARS-CoV-2 by FDA under an Emergency Use Authorization (EUA).  This EUA will remain in effect (meaning this test can be  used) for the duration of  the COVID-19 declaration under Section 564(b)(1) of the Act, 21 U.S.C. section 360bbb-3(b)(1), unless the authorization is terminated or revoked sooner.     Influenza A by PCR NEGATIVE NEGATIVE Final    Influenza B by PCR NEGATIVE NEGATIVE Final    Comment: (NOTE) The Xpert Xpress SARS-CoV-2/FLU/RSV plus assay is intended as an aid in the diagnosis of influenza from Nasopharyngeal swab specimens and should not be used as a sole basis for treatment. Nasal washings and aspirates are unacceptable for Xpert Xpress SARS-CoV-2/FLU/RSV testing.  Fact Sheet for Patients: EntrepreneurPulse.com.au  Fact Sheet for Healthcare Providers: IncredibleEmployment.be  This test is not yet approved or cleared by the [place] FDA and has been authorized for detection and/or diagnosis of SARS-CoV-2 by FDA under an Emergency Use Authorization (EUA). This EUA will remain in effect (meaning this test can be used) for the duration of the COVID-19 declaration under Section 564(b)(1) of the Act, 21 U.S.C. section 360bbb-3(b)(1), unless the authorization is terminated or revoked.  Performed at [facility], [address] [place], [place] [address]   Culture, blood (routine x 2)     Status: None (Preliminary result)   Collection Time: 05/06/21  5:27 AM   Specimen: BLOOD  Result Value Ref Range Status   Specimen Description BLOOD RIGHT ASSIST CONTROL  Final   Special Requests   Final    BOTTLES DRAWN AEROBIC AND ANAEROBIC Blood Culture results may not be optimal due to an excessive volume of blood received in culture bottles   Culture   Final    NO GROWTH 2 DAYS Performed at [facility], [address], [place], [place] [address]    Report Status PENDING  Incomplete  Culture, blood (routine x 2)     Status: None (Preliminary result)   Collection Time: 05/06/21  5:27 AM   Specimen: BLOOD  Result Value Ref Range Status   Specimen Description BLOOD LEFT FOREARM  Final   Special Requests   Final    BOTTLES DRAWN AEROBIC AND ANAEROBIC Blood Culture results may not be optimal due to an excessive volume of blood received in culture bottles    Culture   Final    NO GROWTH 2 DAYS Performed at [facility], [address] [place], [place] [address]    Report Status PENDING  Incomplete      Studies: No results found.  Scheduled Meds:  cholecalciferol  1,000 Units Oral Daily   furosemide  40 mg Intravenous Daily   gabapentin  100 mg Oral QHS   insulin aspart  0-5 Units Subcutaneous QHS   insulin aspart  0-9 Units Subcutaneous TID WC   insulin glargine-yfgn  29 Units Subcutaneous QHS   Ipratropium-Albuterol  2 puff Inhalation Q6H   methylPREDNISolone (  SOLU-MEDROL) injection  40 mg Intravenous Q12H   metoprolol succinate  50 mg Oral Daily   multivitamin-lutein  1 capsule Oral BID   pantoprazole  40 mg Oral Daily   sertraline  200 mg Oral q AM   tamsulosin  0.8 mg Oral QHS   vitamin B-12  1,000 mcg Oral Daily    Continuous Infusions:  remdesivir 100 mg in NS 100 mL 100 mg (05/08/21 0925)     LOS: 2 days     Annita Brod, MD [facility]   05/08/2021, 10:24 AM

## 2021-05-08 NOTE — Progress Notes (Signed)
Called and spoke to [employer] (patient's POA- per patient and family member).  Gave update on patient condition.   Caregiver and family concerned about patient frequent falls.   Let her know PT was recommend SNF once patient stable for discharge.

## 2021-05-08 NOTE — TOC Initial Note (Addendum)
Transition of Care [facility]) - Initial/Assessment Note    Patient Details  Name: Peter Robertson MRN: [id number] Date of Birth: 1940-11-05  [facility] CM/SW Contact:    Candie Chroman, LCSW Phone Number: 05/08/2021, 3:18 PM  Clinical Narrative:   CSW called patient in the room, introduced role, and explained that PT recommendations would be discussed. Patient is agreeable to SNF placement. He tested positive for COVID on 11/18 so earliest he would be able to go anywhere would be 11/29. Will send referral closer to discharge. No further concerns. CSW encouraged patient to contact CSW as needed. CSW will continue to follow patient for support and facilitate discharge to SNF once medically stable.               4:27 pm: Per RN, patient's neighbor has questions about applying for Medicaid. CSW called Ms. Welch and confirmed. She said he has bills from [facility] that are around $8000. Explained that Medicaid probably would not cover past hospital bills. Emailed Development worker, community asking her to reach out to Ms. Judeth Horn with information about applying for Medicaid. Ms. Judeth Horn also asked if insurance would cover ALF. CSW explained that ALF is private pay or covered by Medicaid.  Expected Discharge Plan: Skilled Nursing Facility Barriers to Discharge: Continued Medical Work up   Patient Goals and CMS Choice        Expected Discharge Plan and Services Expected Discharge Plan: [place] Acute Care Choice: [place] Living arrangements for the past 2 months: Single Family Home                                      Prior Living Arrangements/Services Living arrangements for the past 2 months: Single Family Home Lives with:: Self Patient language and need for interpreter reviewed:: Yes Do you feel safe going back to the place where you live?: Yes      Need for Family Participation in Patient Care: Yes (Comment) Care giver support system in  place?: Yes (comment)   Criminal Activity/Legal Involvement Pertinent to Current Situation/Hospitalization: No - Comment as needed  Activities of Daily Living Home Assistive Devices/Equipment: Gilford Rile (specify type) ADL Screening (condition at time of admission) Patient's cognitive ability adequate to safely complete daily activities?: No Is the patient deaf or have difficulty hearing?: Yes Does the patient have difficulty seeing, even when wearing glasses/contacts?: Yes Does the patient have difficulty concentrating, remembering, or making decisions?: Yes Patient able to express need for assistance with ADLs?: Yes Does the patient have difficulty dressing or bathing?: Yes Independently performs ADLs?: No Communication: Independent Dressing (OT): Needs assistance Is this a change from baseline?: Change from baseline, expected to last >3 days Grooming: Needs assistance Is this a change from baseline?: Change from baseline, expected to last >3 days Feeding: Independent Bathing: Needs assistance Is this a change from baseline?: Change from baseline, expected to last >3 days Toileting: Needs assistance Is this a change from baseline?: Change from baseline, expected to last >3days In/Out Bed: Needs assistance Is this a change from baseline?: Change from baseline, expected to last >3 days Walks in Home: Independent with device (comment) Does the patient have difficulty walking or climbing stairs?: Yes Weakness of Legs: Both Weakness of Arms/Hands: None  Permission Sought/Granted Permission sought to share information with : Facility Sport and exercise psychologist Permission granted to share information with : Yes,  Verbal Permission Granted     Permission granted to share info w AGENCY: SNF's        Emotional Assessment Appearance:: Appears stated age Attitude/Demeanor/Rapport: Engaged, Gracious Affect (typically observed): Accepting, Appropriate, Calm, Pleasant Orientation: : Oriented to  Self, Oriented to Place, Oriented to  Time, Oriented to Situation Alcohol / Substance Use: Not Applicable Psych Involvement: No (comment)  Admission diagnosis:  Aspiration pneumonia ([place]) [J69.0] Weakness generalized [R53.1] Hypoxia [R09.02] Injury of head, initial encounter [S09.90XA] Fall, initial encounter [W19.XXXA] Aspiration pneumonia, unspecified aspiration pneumonia type, unspecified laterality, unspecified part of lung ([place]) [J69.0] Pneumonia due to COVID-19 virus [U07.1, J12.82] Acute respiratory disease due to COVID-19 virus [U07.1, J06.9] Patient Active Problem List   Diagnosis Date Noted   Acute on chronic diastolic CHF (congestive heart failure) ([place]) 05/07/2021   Acute respiratory failure with hypoxia ([place]) 05/07/2021   Aspiration pneumonia ([place]) 05/06/2021   Acute respiratory disease due to COVID-19 virus 05/06/2021   Senile dementia without behavioral disturbance ([place]) 05/06/2021   Fall at home, initial encounter 05/06/2021   Hyperlipidemia    Hypertension    Elevated troponin    Chronic diastolic CHF (congestive heart failure) ([place])    Depression    CKD (chronic kidney disease), stage IIIa    Thyroid nodule    Fall    Encounter for psychological evaluation 02/15/2020   AKI (acute kidney injury) ([place]) 02/12/2020   Vertigo 10/11/2019   BPPV (benign paroxysmal positional vertigo) 10/10/2019   CAD (coronary artery disease) 10/10/2019   Type II diabetes mellitus with renal manifestations ([place]) 10/10/2019   BPH (benign prostatic hyperplasia) 10/10/2019   Thrombocytopenia ([place]) 10/10/2019   Dark stools 10/10/2019   Unstable angina ([facility]) 05/20/2018   Chest pain 01/30/2017   PCP:  Danae Orleans, MD Pharmacy:   [id number] [place], [place] [address] [place] [address] Phone: [phone] Fax: [phone]     Social Determinants of Health (SDOH) Interventions    Readmission Risk Interventions No flowsheet data found.

## 2021-05-09 ENCOUNTER — Inpatient Hospital Stay (HOSPITAL_COMMUNITY)
Admit: 2021-05-09 | Discharge: 2021-05-09 | Disposition: A | Discharge status: Home / Self Care | Payer: Medicare Other | Attending: Internal Medicine | Admitting: Internal Medicine

## 2021-05-09 DIAGNOSIS — F0390 Unspecified dementia without behavioral disturbance: Secondary | ICD-10-CM | POA: Diagnosis not present

## 2021-05-09 DIAGNOSIS — I5031 Acute diastolic (congestive) heart failure: Secondary | ICD-10-CM

## 2021-05-09 DIAGNOSIS — N1831 Chronic kidney disease, stage 3a: Secondary | ICD-10-CM | POA: Diagnosis not present

## 2021-05-09 DIAGNOSIS — J9601 Acute respiratory failure with hypoxia: Secondary | ICD-10-CM | POA: Diagnosis not present

## 2021-05-09 DIAGNOSIS — I5033 Acute on chronic diastolic (congestive) heart failure: Secondary | ICD-10-CM | POA: Diagnosis not present

## 2021-05-09 LAB — BASIC METABOLIC PANEL
Anion gap: 9 (ref 5–15)
BUN: 57 mg/dL — ABNORMAL HIGH (ref 8–23)
CO2: 27 mmol/L (ref 22–32)
Calcium: 9 mg/dL (ref 8.9–10.3)
Chloride: 99 mmol/L (ref 98–111)
Creatinine, Ser: 1.77 mg/dL — ABNORMAL HIGH (ref 0.61–1.24)
GFR, Estimated: 38 mL/min — ABNORMAL LOW (ref >60)
Glucose, Bld: 111 mg/dL — ABNORMAL HIGH (ref 70–99)
Potassium: 3.9 mmol/L (ref 3.5–5.1)
Sodium: 135 mmol/L (ref 135–145)

## 2021-05-09 LAB — ECHOCARDIOGRAM COMPLETE
AR max vel: 2.74 cm2
AV Area VTI: 2.84 cm2
AV Area mean vel: 2.25 cm2
AV Mean grad: 3.5 mmHg
AV Peak grad: 5 mmHg
Ao pk vel: 1.11 m/s
Area-P 1/2: 5.16 cm2
Height: 66 in
MV VTI: 2.56 cm2
S' Lateral: 3 cm
Weight: 2853.63 oz

## 2021-05-09 LAB — GLUCOSE, CAPILLARY
Glucose-Capillary: 154 mg/dL — ABNORMAL HIGH (ref 70–99)
Glucose-Capillary: 177 mg/dL — ABNORMAL HIGH (ref 70–99)
Glucose-Capillary: 353 mg/dL — ABNORMAL HIGH (ref 70–99)
Glucose-Capillary: 307 mg/dL — ABNORMAL HIGH (ref 70–99)

## 2021-05-09 NOTE — Progress Notes (Signed)
Physical Therapy Treatment Patient Details Name: Peter Robertson MRN: [id number] DOB: 01/21/1941 Today's Date: 05/09/2021   History of Present Illness Pt admitted for ARF secondary to covid with complaints of falls and SOB symptoms. History includes HTN, HLD, DM, GERD, and MI.    PT Comments    Pt was pleasant and motivated to participate during the session and put forth good effort throughout. Pt required cuing for sequencing and extra effort during sit to stand but was generally steady without LOB. Pt was able to amb 60' x 3 with seated rest breaks between walks with SpO2 and HR WNL per below. Pt drifted L/R while walking and was typically able to correct mild instability during gait but did require min A x 1 to prevent left lateral LOB.  Pt will benefit from PT services in a SNF setting upon discharge to safely address deficits listed in patient problem list for decreased caregiver assistance and eventual return to PLOF.     Recommendations for follow up therapy are one component of a multi-disciplinary discharge planning process, led by the attending physician.  Recommendations may be updated based on patient status, additional functional criteria and insurance authorization.  Follow Up Recommendations  Skilled nursing-short term rehab (<3 hours/day)     Assistance Recommended at Discharge Frequent or constant Supervision/Assistance  Equipment Recommendations  None recommended by PT    Recommendations for Other Services       Precautions / Restrictions Precautions Precautions: Fall Precaution Comments: airborne, droplet precautions Restrictions Weight Bearing Restrictions: No     Mobility  Bed Mobility               General bed mobility comments: received, left in recliner    Transfers Overall transfer level: Needs assistance Equipment used: Rolling walker (2 wheels) Transfers: Sit to/from Stand Sit to Stand: Min guard           General transfer comment: Min  to mod verbal cues for hand placement with fair eccentric and concentric control and stability    Ambulation/Gait Ambulation/Gait assistance: Min assist Gait Distance (Feet): 60 Feet x 3 with seated rest breaks between walks Assistive device: Rolling walker (2 wheels) Gait Pattern/deviations: Step-through pattern;Decreased step length - right;Decreased step length - left;Drifts right/left Gait velocity: decreased     General Gait Details: Very slow cadence with short B step length with frequent drifting L/R, more so to the L, with pt typically able to self-correct but did require min A x 1 to prevent L lateral LOB; SpO2 >/= 96% on room air throughout the session with HR WNL   Stairs             Wheelchair Mobility    Modified Rankin (Stroke Patients Only)       Balance Overall balance assessment: History of Falls;Needs assistance Sitting-balance support: Feet supported Sitting balance-Leahy Scale: Good     Standing balance support: Bilateral upper extremity supported;During functional activity Standing balance-Leahy Scale: Poor Standing balance comment: Pt typically able to correct mild instability during gait but did require min A x 1 to prevent LOB                            Cognition Arousal/Alertness: Awake/alert Behavior During Therapy: WFL for tasks assessed/performed Overall Cognitive Status: No family/caregiver present to determine baseline cognitive functioning  Exercises      General Comments        Pertinent Vitals/Pain Pain Assessment: No/denies pain    Home Living Family/patient expects to be discharged to:: Private residence                        Prior Function            PT Goals (current goals can now be found in the care plan section) Progress towards PT goals: Progressing toward goals    Frequency    Min 2X/week      PT Plan Current plan remains  appropriate    Co-evaluation              AM-PAC PT "6 Clicks" Mobility   Outcome Measure  Help needed turning from your back to your side while in a flat bed without using bedrails?: A Little Help needed moving from lying on your back to sitting on the side of a flat bed without using bedrails?: A Little Help needed moving to and from a bed to a chair (including a wheelchair)?: A Little Help needed standing up from a chair using your arms (e.g., wheelchair or bedside chair)?: A Little Help needed to walk in hospital room?: A Little Help needed climbing 3-5 steps with a railing? : A Lot 6 Click Score: 17    End of Session Equipment Utilized During Treatment: Gait belt Activity Tolerance: Patient tolerated treatment well Patient left: in chair;with chair alarm set;with call bell/phone within reach Nurse Communication: Mobility status PT Visit Diagnosis: Muscle weakness (generalized) (M62.81);Difficulty in walking, not elsewhere classified (R26.2)     Time: 1447-1530 PT Time Calculation (min) (ACUTE ONLY): 43 min  Charges:  $Gait Training: 23-37 mins $Therapeutic Activity: 8-22 mins                    D. Scott Saramarie Stinger PT, DPT 05/09/21, 3:50 PM

## 2021-05-09 NOTE — Progress Notes (Signed)
PROGRESS NOTE  Peter Robertson DTO:[id number] DOB: 10/06/1940 DOA: 05/05/2021 PCP: Danae Orleans, MD  HPI/Recap of past 59 hours: -year-old male with past medical history of hypertension, diabetes mellitus, stage IIIa chronic kidney disease, diastolic CHF, CAD and dementia who presented to the emergency room on 11/17 night after a fall, hitting his head, but with no loss of consciousness.  He was noted to however be in acute respiratory distress and was positive for COVID.  He also look to be slightly volume overloaded.   Oxygen continues to improve, able to be weaned off today.  Patient with no complaints, breathing comfortably.  Assessment/Plan: Principal Problem:   Acute respiratory failure with hypoxia due to COVID-19 virus: Initially on BiPAP, able to be weaned down to 4 L and able to be weaned off.  Will discontinue steroids.  Continue nebulizers and Remdisivir.  Patient does not meet criteria for sepsis given factors could be also related to CHF, therefore sepsis is ruled out. Active Problems:   CAD (coronary artery disease): Stable.    Type II diabetes mellitus with renal manifestations ([place]): CBG should improve as we are discontinuing steroids.    BPH (benign prostatic hyperplasia): Continue Flomax    Senile dementia without behavioral disturbance: Monitor.  Patient is more alert and oriented x2, in no acute distress   Hyperlipidemia: Continue Lipitor.    Hypertension: Should significantly improve as he better diuresis.    Elevated troponin: Likely secondary to CHF, no evidence of ACS. Acute on chronic diastolic CHF (congestive heart failure) ([place]): BNP of 560.  Last echo was December 2019 noting grade 2 diastolic dysfunction and a preserved ejection fraction.  IV Lasix started and patient has diuresed over 3.5 L since admission.  Little change from previous day.  Have stopped IV Lasix 40 mg.  Checking repeat echocardiogram    Depression: Continue home medication.  AKI in the  setting of CKD (chronic kidney disease), stage IIIa: Monitor, especially while getting diuretics.  Creatinine with slight increase likely from slight overdiuresis.  See above.   Thyroid nodule: Incidentally noted, follow-up with PCP.    Fall at home, initial encounter: We will need skilled nursing, although have asked PT and OT to reassess.   Code Status: Full code  Family Communication: Left message for neighbor who is only contact listed  Disposition Plan: Asking PT and OT to reassess.  If he still needs skilled nursing, he will need to be here at least until 11/29 due to [place].    Consultants: None  Procedures: Echo pending  Antimicrobials: IV Remdisivir 11/18-11/22  DVT prophylaxis: SCDs  Level of care: Telemetry Medical   Objective: Vitals:   05/09/21 0411 05/09/21 0836  BP: (!) 147/82 (!) 142/80  Pulse: 75 78  Resp: 18   Temp: (!) 97.4 F (36.3 C) 97.6 F (36.4 C)  SpO2: 98% 98%    Intake/Output Summary (Last 24 hours) at 05/09/2021 0913 Last data filed at 05/09/2021 0836 Gross per 24 hour  Intake 1200 ml  Output 2401 ml  Net -1201 ml    Filed Weights   05/05/21 1528 05/06/21 2245  Weight: 82.6 kg 80.9 kg   Body mass index is 28.79 kg/m.  Exam:  General: Alert and oriented x2, no acute distress HEENT: Normocephalic and atraumatic, mucous membranes are slightly dry Cardiovascular: Regular rate and rhythm, S1-S2 Respiratory: Decreased breath sounds throughout, no crackles or rales Abdomen: Soft, nontender, nondistended, positive bowel sounds Musculoskeletal: No clubbing or cyanosis or edema Skin:  No skin breaks, tears or lesions Psychiatry: Appropriate, no evidence of psychoses Neurology: No focal deficits   Data Reviewed: CBC: Recent Labs  Lab 05/05/21 1531 05/07/21 0631 05/08/21 0436  WBC 5.1 6.0 6.7  NEUTROABS  --  5.3  --   HGB 12.0* 11.5* 12.0*  HCT 36.5* 34.5* 35.9*  MCV 85.3 84.8 83.5  PLT 99* 88* 100*    Basic  Metabolic Panel: Recent Labs  Lab 05/05/21 1531 05/07/21 0631 05/08/21 0436 05/09/21 0443  NA 137 137 133* 135  K 3.8 4.3 5.1 3.9  CL 99 101 97* 99  CO2 25 26 29 27   GLUCOSE 168* 227* 191* 111*  BUN 24* 41* 54* 57*  CREATININE 1.57* 1.70* 1.70* 1.77*  CALCIUM 8.8* 8.8* 8.7* 9.0    GFR: Estimated Creatinine Clearance: 32.7 mL/min (A) (by C-G formula based on SCr of 1.77 mg/dL (H)). Liver Function Tests: Recent Labs  Lab 05/06/21 0805 05/07/21 0631  AST 18 16  ALT 11 10  ALKPHOS 60 55  BILITOT 1.1 1.0  PROT 6.4* 6.5  ALBUMIN 3.7 3.7    No results for input(s): LIPASE, AMYLASE in the last 168 hours. No results for input(s): AMMONIA in the last 168 hours. Coagulation Profile: No results for input(s): INR, PROTIME in the last 168 hours. Cardiac Enzymes: No results for input(s): CKTOTAL, CKMB, CKMBINDEX, TROPONINI in the last 168 hours. BNP (last 3 results) No results for input(s): PROBNP in the last 8760 hours. HbA1C: No results for input(s): HGBA1C in the last 72 hours.  CBG: Recent Labs  Lab 05/08/21 1211 05/08/21 1630 05/08/21 2001 05/08/21 2223 05/09/21 0840  GLUCAP 269* 397* 419* 396* 154*    Lipid Profile: Recent Labs    05/07/21 0631  CHOL 111  HDL 30*  LDLCALC 69  TRIG 59  CHOLHDL 3.7    Thyroid Function Tests: No results for input(s): TSH, T4TOTAL, FREET4, T3FREE, THYROIDAB in the last 72 hours. Anemia Panel: No results for input(s): VITAMINB12, FOLATE, FERRITIN, TIBC, IRON, RETICCTPCT in the last 72 hours.  Urine analysis:    Component Value Date/Time   COLORURINE YELLOW (A) 05/06/2021 0224   APPEARANCEUR CLEAR (A) 05/06/2021 0224   LABSPEC 1.013 05/06/2021 0224   PHURINE 7.0 05/06/2021 0224   GLUCOSEU NEGATIVE 05/06/2021 0224   HGBUR SMALL (A) 05/06/2021 0224   BILIRUBINUR NEGATIVE 05/06/2021 0224   KETONESUR 5 (A) 05/06/2021 0224   PROTEINUR >=300 (A) 05/06/2021 0224   NITRITE NEGATIVE 05/06/2021 0224   LEUKOCYTESUR NEGATIVE  05/06/2021 0224   Sepsis Labs: @LABRCNTIP (procalcitonin:4,lacticidven:4)  ) Recent Results (from the past 240 hour(s))  Urine Culture     Status: None   Collection Time: 05/06/21  2:24 AM   Specimen: Urine, Random  Result Value Ref Range Status   Specimen Description   Final    URINE, RANDOM Performed at [facility], [address], [place], [place] [address]    Special Requests   Final    NONE Performed at [facility], [address] [place], [place] [address]    Culture   Final    NO GROWTH Performed at [facility], [place] [address], [place], [place] [address]    Report Status 05/07/2021 FINAL  Final  Resp Panel by RT-PCR (Flu A&B, Covid) Nasopharyngeal Swab     Status: Abnormal   Collection Time: 05/06/21  5:27 AM   Specimen: Nasopharyngeal Swab; Nasopharyngeal(NP) swabs in vial transport medium  Result Value Ref Range Status   SARS  Coronavirus 2 by RT PCR POSITIVE (A) NEGATIVE Final    Comment: RESULT CALLED TO, READ BACK BY AND VERIFIED WITH: ALYCIA BEVERLY @0646  05/06/21 MJU (NOTE) SARS-CoV-2 target nucleic acids are DETECTED.  The SARS-CoV-2 RNA is generally detectable in upper respiratory specimens during the acute phase of infection. Positive results are indicative of the presence of the identified virus, but do not rule out bacterial infection or co-infection with other pathogens not detected by the test. Clinical correlation with patient history and other diagnostic information is necessary to determine patient infection status. The expected result is Negative.  Fact Sheet for Patients: EntrepreneurPulse.com.au  Fact Sheet for Healthcare Providers: IncredibleEmployment.be  This test is not yet approved or cleared by the [place] FDA and  has been authorized for detection and/or diagnosis of SARS-CoV-2 by FDA under an Emergency Use Authorization (EUA).  This EUA will remain in effect  (meaning this test can be  used) for the duration of  the COVID-19 declaration under Section 564(b)(1) of the Act, 21 U.S.C. section 360bbb-3(b)(1), unless the authorization is terminated or revoked sooner.     Influenza A by PCR NEGATIVE NEGATIVE Final   Influenza B by PCR NEGATIVE NEGATIVE Final    Comment: (NOTE) The Xpert Xpress SARS-CoV-2/FLU/RSV plus assay is intended as an aid in the diagnosis of influenza from Nasopharyngeal swab specimens and should not be used as a sole basis for treatment. Nasal washings and aspirates are unacceptable for Xpert Xpress SARS-CoV-2/FLU/RSV testing.  Fact Sheet for Patients: EntrepreneurPulse.com.au  Fact Sheet for Healthcare Providers: IncredibleEmployment.be  This test is not yet approved or cleared by the [place] FDA and has been authorized for detection and/or diagnosis of SARS-CoV-2 by FDA under an Emergency Use Authorization (EUA). This EUA will remain in effect (meaning this test can be used) for the duration of the COVID-19 declaration under Section 564(b)(1) of the Act, 21 U.S.C. section 360bbb-3(b)(1), unless the authorization is terminated or revoked.  Performed at [facility], [address] [place], [place] [address]   Culture, blood (routine x 2)     Status: None (Preliminary result)   Collection Time: 05/06/21  5:27 AM   Specimen: BLOOD  Result Value Ref Range Status   Specimen Description BLOOD RIGHT ASSIST CONTROL  Final   Special Requests   Final    BOTTLES DRAWN AEROBIC AND ANAEROBIC Blood Culture results may not be optimal due to an excessive volume of blood received in culture bottles   Culture   Final    NO GROWTH 2 DAYS Performed at [facility], [address], [place], [place] [address]    Report Status PENDING  Incomplete  Culture, blood (routine x 2)     Status: None (Preliminary result)   Collection Time: 05/06/21  5:27 AM   Specimen: BLOOD   Result Value Ref Range Status   Specimen Description BLOOD LEFT FOREARM  Final   Special Requests   Final    BOTTLES DRAWN AEROBIC AND ANAEROBIC Blood Culture results may not be optimal due to an excessive volume of blood received in culture bottles   Culture   Final    NO GROWTH 2 DAYS Performed at [facility], [address] [place], [place] [address]    Report Status PENDING  Incomplete      Studies: No results found.  Scheduled Meds:  cholecalciferol  1,000 Units Oral Daily   gabapentin  100 mg Oral QHS   insulin aspart  0-5 Units Subcutaneous  QHS   insulin aspart  0-9 Units Subcutaneous TID WC   insulin glargine-yfgn  29 Units Subcutaneous QHS   Ipratropium-Albuterol  2 puff Inhalation Q6H   methylPREDNISolone (SOLU-MEDROL) injection  40 mg Intravenous Q12H   metoprolol succinate  50 mg Oral Daily   multivitamin-lutein  1 capsule Oral BID   pantoprazole  40 mg Oral Daily   sertraline  200 mg Oral q AM   tamsulosin  0.8 mg Oral QHS   vitamin B-12  1,000 mcg Oral Daily    Continuous Infusions:  remdesivir 100 mg in NS 100 mL 100 mg (05/08/21 0925)     LOS: 3 days     Annita Brod, MD [facility]   05/09/2021, 9:13 AM

## 2021-05-09 NOTE — Progress Notes (Signed)
Inpatient Diabetes Program Recommendations  AACE/ADA: New Consensus Statement on Inpatient Glycemic Control   Target Ranges:  Prepandial:   less than 140 mg/dL      Peak postprandial:   less than 180 mg/dL (1-2 hours)      Critically ill patients:  140 - 180 mg/dL    Latest Reference Range & Units 05/08/21 08:22 05/08/21 12:11 05/08/21 16:30 05/08/21 20:01 05/08/21 22:23 05/09/21 08:40  Glucose-Capillary 70 - 99 mg/dL 205 (H) 269 (H) 397 (H) 419 (H) 396 (H) 154 (H)  (H): Data is abnormally high  Review of Glycemic Control  Diabetes history: DM2 Outpatient Diabetes medications: Lantus 38 units QHS, Regulr 0.5-2.5 units TID with meals, Metformin 1000 mg BID Current orders for Inpatient glycemic control: Semglee 29 units QHS, Novolog 0-9 units TID with meals, Novolog 0-5 units QHS; Solumedrol 40 mg Q12H  Inpatient Diabetes Program Recommendations:    Insulin: If steroids are continued, please consider ordering Novolog 4 units TID with meals for meal coverage if patient eats at least 50% of meals.  Thanks, Barnie Alderman, RN, MSN, CDE Diabetes Coordinator Inpatient Diabetes Program [phone] (Team Pager from 8am to 5pm)

## 2021-05-09 NOTE — Progress Notes (Signed)
*  PRELIMINARY RESULTS* Echocardiogram 2D Echocardiogram has been performed.  Sherrie Sport 05/09/2021, 10:59 AM

## 2021-05-09 NOTE — Progress Notes (Signed)
Occupational Therapy Treatment Patient Details Name: Peter Robertson MRN: [id number] DOB: [birth year] Today's Date: 05/09/2021   History of present illness Pt admitted for ARF secondary to covid with complaints of falls and SOB symptoms. History includes HTN, HLD, DM, GERD, and MI.   OT comments  Peter Robertson made good effort today, appearing steadier, with improved balance and endurance, than during yesterday's OT session. Pt now on room air, with O2 sats remaining in upper 90s with OOB activity. Pt does have a history of falls at home, and continues to display moments of mild instability with fxl activity in standing. Will continue to offer rehab services during hospitalization. This pt could benefit from STR to address balance and endurance concerns, given limited caregiver support (PRN assistance from a neighbor) and repeated falls. Alternatively, if pt could arrange more regular home support, DC to home with HHOT/PT could also be a possibility.    Recommendations for follow up therapy are one component of a multi-disciplinary discharge planning process, led by the attending physician.  Recommendations may be updated based on patient status, additional functional criteria and insurance authorization.    Follow Up Recommendations  Skilled nursing-short term rehab (<3 hours/day)    Assistance Recommended at Discharge Intermittent Supervision/Assistance  Equipment Recommendations  None recommended by OT    Recommendations for Other Services      Precautions / Restrictions Precautions Precautions: Fall Precaution Comments: airborne, droplet precautions Restrictions Weight Bearing Restrictions: No       Mobility Bed Mobility Overal bed mobility: Needs Assistance Bed Mobility: Supine to Sit     Supine to sit: Modified independent (Device/Increase time)     General bed mobility comments: received, left in recliner    Transfers Overall transfer level: Needs assistance Equipment  used: Rolling walker (2 wheels) Transfers: Sit to/from Stand Sit to Stand: Min guard   Step pivot transfers: Min guard       General transfer comment: Min to mod verbal cues for hand placement with fair eccentric and concentric control and stability     Balance Overall balance assessment: History of Falls;Needs assistance Sitting-balance support: Feet supported Sitting balance-Leahy Scale: Good     Standing balance support: Bilateral upper extremity supported;During functional activity Standing balance-Leahy Scale: Fair Standing balance comment: several episodes of mild instability, w/ pt able to self-correct                           ADL either performed or assessed with clinical judgement   ADL Overall ADL's : Needs assistance/impaired                     Lower Body Dressing: Min guard                      Extremity/Trunk Assessment Upper Extremity Assessment Upper Extremity Assessment: Overall WFL for tasks assessed   Lower Extremity Assessment Lower Extremity Assessment: Overall WFL for tasks assessed        Vision       Perception     Praxis      Cognition Arousal/Alertness: Awake/alert Behavior During Therapy: WFL for tasks assessed/performed Overall Cognitive Status: No family/caregiver present to determine baseline cognitive functioning  Exercises Other Exercises Other Exercises: bed mobility, UB and LB therex, sit<>stand, standing balance tolerance, discuss re: DC recs   Shoulder Instructions       General Comments      Pertinent Vitals/ Pain       Pain Assessment: No/denies pain  Home Living Family/patient expects to be discharged to:: Private residence                                        Prior Functioning/Environment              Frequency  Min 2X/week        Progress Toward Goals  OT Goals(current goals can now be  found in the care plan section)  Progress towards OT goals: Progressing toward goals  Acute Rehab OT Goals Patient Stated Goal: to to home OT Goal Formulation: With patient Time For Goal Achievement: 05/22/21 Potential to Achieve Goals: Good  Plan Discharge plan remains appropriate;Frequency remains appropriate    Co-evaluation                 AM-PAC OT "6 Clicks" Daily Activity     Outcome Measure   Help from another person eating meals?: None Help from another person taking care of personal grooming?: A Little Help from another person toileting, which includes using toliet, bedpan, or urinal?: A Little Help from another person bathing (including washing, rinsing, drying)?: A Little Help from another person to put on and taking off regular upper body clothing?: A Little Help from another person to put on and taking off regular lower body clothing?: A Little 6 Click Score: 19    End of Session Equipment Utilized During Treatment: Rolling walker (2 wheels)  OT Visit Diagnosis: Unsteadiness on feet (R26.81);History of falling (Z91.81);Muscle weakness (generalized) (M62.81)   Activity Tolerance Patient tolerated treatment well   Patient Left in chair;with call bell/phone within reach;with chair alarm set   Nurse Communication          Time: 6283-1517 OT Time Calculation (min): 20 min  Charges: OT General Charges $OT Visit: 1 Visit OT Treatments $Self Care/Home Management : 8-22 mins  Josiah Lobo, PhD, MS, OTR/L 05/09/21, 4:29 PM

## 2021-05-09 NOTE — Plan of Care (Signed)
Patient slept well, VSS, no BM, monitored closely  Problem: Education: Goal: Knowledge of General Education information will improve Description: Including pain rating scale, medication(s)/side effects and non-pharmacologic comfort measures Outcome: Progressing   Problem: Health Behavior/Discharge Planning: Goal: Ability to manage health-related needs will improve Outcome: Progressing

## 2021-05-09 NOTE — Plan of Care (Signed)
  Problem: Education: Goal: Knowledge of General Education information will improve Description Including pain rating scale, medication(s)/side effects and non-pharmacologic comfort measures Outcome: Progressing   Problem: Health Behavior/Discharge Planning: Goal: Ability to manage health-related needs will improve Outcome: Progressing   

## 2021-05-10 DIAGNOSIS — I5033 Acute on chronic diastolic (congestive) heart failure: Secondary | ICD-10-CM | POA: Diagnosis not present

## 2021-05-10 DIAGNOSIS — J9601 Acute respiratory failure with hypoxia: Secondary | ICD-10-CM | POA: Diagnosis not present

## 2021-05-10 DIAGNOSIS — N1831 Chronic kidney disease, stage 3a: Secondary | ICD-10-CM | POA: Diagnosis not present

## 2021-05-10 DIAGNOSIS — F0390 Unspecified dementia without behavioral disturbance: Secondary | ICD-10-CM | POA: Diagnosis not present

## 2021-05-10 LAB — GLUCOSE, CAPILLARY
Glucose-Capillary: 113 mg/dL — ABNORMAL HIGH (ref 70–99)
Glucose-Capillary: 182 mg/dL — ABNORMAL HIGH (ref 70–99)
Glucose-Capillary: 256 mg/dL — ABNORMAL HIGH (ref 70–99)
Glucose-Capillary: 208 mg/dL — ABNORMAL HIGH (ref 70–99)

## 2021-05-10 NOTE — Progress Notes (Signed)
Physical Therapy Treatment Patient Details Name: Peter Robertson MRN: [id number] DOB: 09-29-1940 Today's Date: 05/10/2021   History of Present Illness Pt admitted for ARF secondary to covid with complaints of falls and SOB symptoms. History includes HTN, HLD, DM, GERD, and MI.    PT Comments    Pt received seated in recliner upon arrival to room.  Pt agreeable to therapy.  Pt able to perform seated exercises without much difficulty and demonstrated increased balance and ability to ambulate during session today.  Pt initially started with ambulating within the room, however he noted to not be fatigued and wanted to attempt at ambulating outside in the hallway.  Pt able to ambulate around nursing station with clean gown and surgical mask for precautionary purposes.  Pt then returned to room and left in recliner per request.  Pt performed much better today, with increase stability and no complications.  Updated recommendations are listed below to home with HHPT services, however due to insurance complications, outpatient therapy services may be the only option at this point in time.       Recommendations for follow up therapy are one component of a multi-disciplinary discharge planning process, led by the attending physician.  Recommendations may be updated based on patient status, additional functional criteria and insurance authorization.  Follow Up Recommendations  Home health PT     Assistance Recommended at Discharge Intermittent Supervision/Assistance  Equipment Recommendations  None recommended by PT    Recommendations for Other Services       Precautions / Restrictions Precautions Precautions: Fall Precaution Comments: airborne, droplet precautions     Mobility  Bed Mobility               General bed mobility comments: received, left in recliner    Transfers Overall transfer level: Needs assistance Equipment used: Rolling walker (2 wheels) Transfers: Sit to/from  Stand Sit to Stand: Min guard           General transfer comment: much better control when performing eccentric and concentric control.    Ambulation/Gait Ambulation/Gait assistance: Min assist Gait Distance (Feet): 220 Feet Assistive device: Rolling walker (2 wheels) Gait Pattern/deviations: Step-through pattern;Decreased step length - right;Decreased step length - left;Drifts right/left Gait velocity: decreased     General Gait Details: Very slow cadence with short B step length with frequent drifting L/R, more so to the L, with pt typically able to self-correct but did require min A x 1 to prevent L lateral LOB   Stairs             Wheelchair Mobility    Modified Rankin (Stroke Patients Only)       Balance Overall balance assessment: History of Falls;Needs assistance Sitting-balance support: Feet supported Sitting balance-Leahy Scale: Good     Standing balance support: Bilateral upper extremity supported;During functional activity Standing balance-Leahy Scale: Good Standing balance comment: pt has good stability in standing and during ambulation.  still utilization of FWW for support, but much mroe stabl than in previous sessions.                            Cognition Arousal/Alertness: Awake/alert Behavior During Therapy: WFL for tasks assessed/performed Overall Cognitive Status: No family/caregiver present to determine baseline cognitive functioning  Exercises      General Comments        Pertinent Vitals/Pain Pain Assessment: No/denies pain    Home Living                          Prior Function            PT Goals (current goals can now be found in the care plan section) Acute Rehab PT Goals Patient Stated Goal: to get stronger PT Goal Formulation: With patient Time For Goal Achievement: 05/21/21 Potential to Achieve Goals: Good Progress towards PT goals:  Progressing toward goals    Frequency    Min 2X/week      PT Plan Discharge plan needs to be updated    Co-evaluation              AM-PAC PT "6 Clicks" Mobility   Outcome Measure  Help needed turning from your back to your side while in a flat bed without using bedrails?: A Little Help needed moving from lying on your back to sitting on the side of a flat bed without using bedrails?: A Little Help needed moving to and from a bed to a chair (including a wheelchair)?: A Little Help needed standing up from a chair using your arms (e.g., wheelchair or bedside chair)?: A Little Help needed to walk in hospital room?: A Little Help needed climbing 3-5 steps with a railing? : A Lot 6 Click Score: 17    End of Session Equipment Utilized During Treatment: Gait belt Activity Tolerance: Patient tolerated treatment well Patient left: in chair;with chair alarm set;with call bell/phone within reach Nurse Communication: Mobility status PT Visit Diagnosis: Muscle weakness (generalized) (M62.81);Difficulty in walking, not elsewhere classified (R26.2)     Time: 5038-8828 PT Time Calculation (min) (ACUTE ONLY): 41 min  Charges:  $Gait Training: 23-37 mins $Therapeutic Exercise: 8-22 mins                     Peter Robertson, PT, DPT 05/10/21, 5:34 PM    Peter Robertson 05/10/2021, 5:23 PM

## 2021-05-10 NOTE — Progress Notes (Signed)
PROGRESS NOTE  EOGHAN BELCHER UXL:[id number] DOB: Jun 29, 1940 DOA: 05/05/2021 PCP: Danae Orleans, MD  HPI/Recap of past 13 hours: -year-old male with past medical history of hypertension, diabetes mellitus, stage IIIa chronic kidney disease, diastolic CHF, CAD and dementia who presented to the emergency room on 11/17 night after a fall, hitting his head, but with no loss of consciousness.  He was noted to however be in acute respiratory distress and was positive for COVID.  He also look to be slightly volume overloaded.   Patient able to be weaned off of oxygen.  Fully diuresed.  Seen by PT who are recommending SNF, which he will be able to go after 11/29, after 10 day quarantine from Covid.  He is comfortable, no complaints.  Assessment/Plan: Principal Problem:   Acute respiratory failure with hypoxia due to COVID-19 virus: Initially on BiPAP, able to be weaned down to 4 L and able to be weaned off.  Will discontinue steroids.  Continue nebulizers and Remdisivir.  Patient does not meet criteria for sepsis given factors could be also related to CHF, therefore sepsis is ruled out. Active Problems:   CAD (coronary artery disease): Stable.    Type II diabetes mellitus with renal manifestations ([place]): CBG should improve as we are discontinuing steroids.    BPH (benign prostatic hyperplasia): Continue Flomax    Senile dementia without behavioral disturbance: Monitor.  Patient is more alert and oriented x2, in no acute distress    Hyperlipidemia: Continue Lipitor.    Hypertension: Better with diuresis.    Elevated troponin: Likely secondary to CHF, no evidence of ACS. Acute on chronic diastolic CHF (congestive heart failure) ([place]): BNP of 560.  Last echo was December 2019 noting grade 2 diastolic dysfunction and a preserved ejection fraction.  IV Lasix started and patient has diuresed over 7 L since admission.  Little change from previous day.  Have stopped IV Lasix 40 mg.  Echo notes grade 2  diastolic dysfunction.    Depression: Continue home medication.  AKI in the setting of CKD (chronic kidney disease), stage IIIa: Monitor, especially while getting diuretics.  Creatinine with slight increase likely from slight overdiuresis.  See above.   Thyroid nodule: Incidentally noted, follow-up with PCP.    Fall at home, initial encounter: For SNF   Code Status: Full code  Family Communication: Left message for neighbor who is only contact listed  Disposition Plan: needs skilled nursing, he will need to be here at least until 11/29 due to [place].    Consultants: None  Procedures: Echo : Grade 2 diastolic dysfunction  Antimicrobials: IV Remdisivir 11/18-11/22  DVT prophylaxis: SCDs  Level of care: Med-Surg   Objective: Vitals:   05/10/21 0806 05/10/21 1144  BP: 121/75 (!) 117/104  Pulse: 94 (!) 51  Resp:    Temp:    SpO2: 97% 98%    Intake/Output Summary (Last 24 hours) at 05/10/2021 1433 Last data filed at 05/09/2021 1818 Gross per 24 hour  Intake 440 ml  Output 600 ml  Net -160 ml    Filed Weights   05/05/21 1528 05/06/21 2245  Weight: 82.6 kg 80.9 kg   Body mass index is 28.79 kg/m.  Exam:  General: Alert and oriented x2, no acute distress HEENT: Normocephalic and atraumatic, mucous membranes are slightly dry Cardiovascular: Regular rate and rhythm, S1-S2 Respiratory: Decreased breath sounds throughout, no crackles or rales Abdomen: Soft, nontender, nondistended, positive bowel sounds Musculoskeletal: No clubbing or cyanosis or edema Skin: No skin  breaks, tears or lesions Psychiatry: Appropriate, no evidence of psychoses Neurology: No focal deficits   Data Reviewed: CBC: Recent Labs  Lab 05/05/21 1531 05/07/21 0631 05/08/21 0436  WBC 5.1 6.0 6.7  NEUTROABS  --  5.3  --   HGB 12.0* 11.5* 12.0*  HCT 36.5* 34.5* 35.9*  MCV 85.3 84.8 83.5  PLT 99* 88* 100*    Basic Metabolic Panel: Recent Labs  Lab 05/05/21 1531  05/07/21 0631 05/08/21 0436 05/09/21 0443  NA 137 137 133* 135  K 3.8 4.3 5.1 3.9  CL 99 101 97* 99  CO2 25 26 29 27   GLUCOSE 168* 227* 191* 111*  BUN 24* 41* 54* 57*  CREATININE 1.57* 1.70* 1.70* 1.77*  CALCIUM 8.8* 8.8* 8.7* 9.0    GFR: Estimated Creatinine Clearance: 32.7 mL/min (A) (by C-G formula based on SCr of 1.77 mg/dL (H)). Liver Function Tests: Recent Labs  Lab 05/06/21 0805 05/07/21 0631  AST 18 16  ALT 11 10  ALKPHOS 60 55  BILITOT 1.1 1.0  PROT 6.4* 6.5  ALBUMIN 3.7 3.7    No results for input(s): LIPASE, AMYLASE in the last 168 hours. No results for input(s): AMMONIA in the last 168 hours. Coagulation Profile: No results for input(s): INR, PROTIME in the last 168 hours. Cardiac Enzymes: No results for input(s): CKTOTAL, CKMB, CKMBINDEX, TROPONINI in the last 168 hours. BNP (last 3 results) No results for input(s): PROBNP in the last 8760 hours. HbA1C: No results for input(s): HGBA1C in the last 72 hours.  CBG: Recent Labs  Lab 05/09/21 1201 05/09/21 1546 05/09/21 2057 05/10/21 0803 05/10/21 1143  GLUCAP 177* 353* 307* 113* 182*    Lipid Profile: No results for input(s): CHOL, HDL, LDLCALC, TRIG, CHOLHDL, LDLDIRECT in the last 72 hours.  Thyroid Function Tests: No results for input(s): TSH, T4TOTAL, FREET4, T3FREE, THYROIDAB in the last 72 hours. Anemia Panel: No results for input(s): VITAMINB12, FOLATE, FERRITIN, TIBC, IRON, RETICCTPCT in the last 72 hours.  Urine analysis:    Component Value Date/Time   COLORURINE YELLOW (A) 05/06/2021 0224   APPEARANCEUR CLEAR (A) 05/06/2021 0224   LABSPEC 1.013 05/06/2021 0224   PHURINE 7.0 05/06/2021 0224   GLUCOSEU NEGATIVE 05/06/2021 0224   HGBUR SMALL (A) 05/06/2021 0224   BILIRUBINUR NEGATIVE 05/06/2021 0224   KETONESUR 5 (A) 05/06/2021 0224   PROTEINUR >=300 (A) 05/06/2021 0224   NITRITE NEGATIVE 05/06/2021 0224   LEUKOCYTESUR NEGATIVE 05/06/2021 0224   Sepsis  Labs: @LABRCNTIP (procalcitonin:4,lacticidven:4)  ) Recent Results (from the past 240 hour(s))  Urine Culture     Status: None   Collection Time: 05/06/21  2:24 AM   Specimen: Urine, Random  Result Value Ref Range Status   Specimen Description   Final    URINE, RANDOM Performed at [facility], [address] [place], [place] [address]    Special Requests   Final    NONE Performed at [facility], [address] [place], [place] [address]    Culture   Final    NO GROWTH Performed at [facility], [place] [address], [place], [place] [address]    Report Status 05/07/2021 FINAL  Final  Resp Panel by RT-PCR (Flu A&B, Covid) Nasopharyngeal Swab     Status: Abnormal   Collection Time: 05/06/21  5:27 AM   Specimen: Nasopharyngeal Swab; Nasopharyngeal(NP) swabs in vial transport medium  Result Value Ref Range Status   SARS Coronavirus 2 by RT PCR POSITIVE (A) NEGATIVE Final  Comment: RESULT CALLED TO, READ BACK BY AND VERIFIED WITH: ALYCIA BEVERLY @0646  05/06/21 MJU (NOTE) SARS-CoV-2 target nucleic acids are DETECTED.  The SARS-CoV-2 RNA is generally detectable in upper respiratory specimens during the acute phase of infection. Positive results are indicative of the presence of the identified virus, but do not rule out bacterial infection or co-infection with other pathogens not detected by the test. Clinical correlation with patient history and other diagnostic information is necessary to determine patient infection status. The expected result is Negative.  Fact Sheet for Patients: EntrepreneurPulse.com.au  Fact Sheet for Healthcare Providers: IncredibleEmployment.be  This test is not yet approved or cleared by the [place] FDA and  has been authorized for detection and/or diagnosis of SARS-CoV-2 by FDA under an Emergency Use Authorization (EUA).  This EUA will remain in effect (meaning this test can be  used)  for the duration of  the COVID-19 declaration under Section 564(b)(1) of the Act, 21 U.S.C. section 360bbb-3(b)(1), unless the authorization is terminated or revoked sooner.     Influenza A by PCR NEGATIVE NEGATIVE Final   Influenza B by PCR NEGATIVE NEGATIVE Final    Comment: (NOTE) The Xpert Xpress SARS-CoV-2/FLU/RSV plus assay is intended as an aid in the diagnosis of influenza from Nasopharyngeal swab specimens and should not be used as a sole basis for treatment. Nasal washings and aspirates are unacceptable for Xpert Xpress SARS-CoV-2/FLU/RSV testing.  Fact Sheet for Patients: EntrepreneurPulse.com.au  Fact Sheet for Healthcare Providers: IncredibleEmployment.be  This test is not yet approved or cleared by the [place] FDA and has been authorized for detection and/or diagnosis of SARS-CoV-2 by FDA under an Emergency Use Authorization (EUA). This EUA will remain in effect (meaning this test can be used) for the duration of the COVID-19 declaration under Section 564(b)(1) of the Act, 21 U.S.C. section 360bbb-3(b)(1), unless the authorization is terminated or revoked.  Performed at [facility], [address] [place], [place] [address]   Culture, blood (routine x 2)     Status: None (Preliminary result)   Collection Time: 05/06/21  5:27 AM   Specimen: BLOOD  Result Value Ref Range Status   Specimen Description BLOOD RIGHT ASSIST CONTROL  Final   Special Requests   Final    BOTTLES DRAWN AEROBIC AND ANAEROBIC Blood Culture results may not be optimal due to an excessive volume of blood received in culture bottles   Culture   Final    NO GROWTH 4 DAYS Performed at [facility], [address], [place], [place] [address]    Report Status PENDING  Incomplete  Culture, blood (routine x 2)     Status: None (Preliminary result)   Collection Time: 05/06/21  5:27 AM   Specimen: BLOOD  Result Value Ref Range Status    Specimen Description BLOOD LEFT FOREARM  Final   Special Requests   Final    BOTTLES DRAWN AEROBIC AND ANAEROBIC Blood Culture results may not be optimal due to an excessive volume of blood received in culture bottles   Culture   Final    NO GROWTH 4 DAYS Performed at [facility], [address] [place], [place] [address]    Report Status PENDING  Incomplete      Studies: No results found.  Scheduled Meds:  cholecalciferol  1,000 Units Oral Daily   gabapentin  100 mg Oral QHS   insulin aspart  0-5 Units Subcutaneous QHS   insulin aspart  0-9 Units Subcutaneous TID WC  insulin glargine-yfgn  29 Units Subcutaneous QHS   Ipratropium-Albuterol  2 puff Inhalation Q6H   metoprolol succinate  50 mg Oral Daily   multivitamin-lutein  1 capsule Oral BID   pantoprazole  40 mg Oral Daily   sertraline  200 mg Oral q AM   tamsulosin  0.8 mg Oral QHS   vitamin B-12  1,000 mcg Oral Daily    Continuous Infusions:     LOS: 4 days     Annita Brod, MD [facility]   05/10/2021, 2:33 PM

## 2021-05-10 NOTE — Plan of Care (Signed)
  Problem: Education: Goal: Knowledge of General Education information will improve Description: Including pain rating scale, medication(s)/side effects and non-pharmacologic comfort measures Outcome: Progressing   Problem: Clinical Measurements: Goal: Respiratory complications will improve Outcome: Progressing   

## 2021-05-11 DIAGNOSIS — W19XXXA Unspecified fall, initial encounter: Secondary | ICD-10-CM | POA: Diagnosis not present

## 2021-05-11 DIAGNOSIS — S0990XA Unspecified injury of head, initial encounter: Secondary | ICD-10-CM

## 2021-05-11 DIAGNOSIS — N400 Enlarged prostate without lower urinary tract symptoms: Secondary | ICD-10-CM | POA: Diagnosis not present

## 2021-05-11 DIAGNOSIS — U071 COVID-19: Secondary | ICD-10-CM | POA: Diagnosis not present

## 2021-05-11 DIAGNOSIS — E782 Mixed hyperlipidemia: Secondary | ICD-10-CM

## 2021-05-11 DIAGNOSIS — J9601 Acute respiratory failure with hypoxia: Secondary | ICD-10-CM | POA: Diagnosis not present

## 2021-05-11 DIAGNOSIS — Z794 Long term (current) use of insulin: Secondary | ICD-10-CM

## 2021-05-11 DIAGNOSIS — F320 Major depressive disorder, single episode, mild: Secondary | ICD-10-CM

## 2021-05-11 DIAGNOSIS — Y92009 Unspecified place in unspecified non-institutional (private) residence as the place of occurrence of the external cause: Secondary | ICD-10-CM

## 2021-05-11 DIAGNOSIS — R531 Weakness: Secondary | ICD-10-CM

## 2021-05-11 DIAGNOSIS — E1121 Type 2 diabetes mellitus with diabetic nephropathy: Secondary | ICD-10-CM

## 2021-05-11 LAB — CULTURE, BLOOD (ROUTINE X 2)
Culture: NO GROWTH
Culture: NO GROWTH

## 2021-05-11 LAB — GLUCOSE, CAPILLARY
Glucose-Capillary: 139 mg/dL — ABNORMAL HIGH (ref 70–99)
Glucose-Capillary: 118 mg/dL — ABNORMAL HIGH (ref 70–99)
Glucose-Capillary: 156 mg/dL — ABNORMAL HIGH (ref 70–99)
Glucose-Capillary: 186 mg/dL — ABNORMAL HIGH (ref 70–99)

## 2021-05-11 MED ORDER — INSULIN GLARGINE-YFGN 100 UNIT/ML ~~LOC~~ SOLN
30.0000 [IU] | Freq: Every day | SUBCUTANEOUS | Status: DC
  Administered 2021-05-12 – 2021-05-23 (×12): 30 [IU] via SUBCUTANEOUS
  Filled 2021-05-11 (×12): qty 0.3

## 2021-05-11 MED ORDER — IPRATROPIUM-ALBUTEROL 20-100 MCG/ACT IN AERS
2.0000 | INHALATION_SPRAY | Freq: Two times a day (BID) | RESPIRATORY_TRACT | Status: DC
Start: 2021-05-11 — End: 2021-05-23
  Administered 2021-05-11 – 2021-05-23 (×22): 2 via RESPIRATORY_TRACT
  Filled 2021-05-11: qty 4

## 2021-05-11 NOTE — Progress Notes (Signed)
PROGRESS NOTE  Peter Robertson    DOB: 1940-11-26, 80 y.o.  GBT:[id number]  PCP: Danae Orleans, MD   Code Status: Full Code   DOA: 05/05/2021   LOS: 5  Brief Narrative of Current Hospitalization  Peter Robertson is a 80 y.o. male with a PMH significant for HTN, Type 2 DM, CKDIIIa, HFpEF, CAD, dementia. They presented from home to the ED on 05/05/2021 with fall and head injury.  In the ED, it was found that they had acute respiratory distress with COVID and fluid overload. They were treated with oxygen, BiPAP, steroids, breathing treatments, remdesivir.  Patient was admitted to medicine service for further workup and management of acute hypoxic respiratory failure in setting of COVID as outlined in detail below.  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  Completed remdesivir.  Patient able to discharge to SNF once COVID-19 isolation precautions met by SNF.  Patient strongly wishes to go home. -PT/OT to continue evaluation for improvement and treatments to increase strength -Mucinex as needed -Combivent twice daily  Type II DM-hemoglobin A1c 7.1 on admission which is below goal of 8 for age.  Blood sugars well controlled, were elevated early on admission due to steroid administration. -Changing insulin administration to a.m. dosing for better  safety profile  -30 units Semglee a.m. -Can likely discontinue sliding scale if blood sugars are well controlled tomorrow.  CKD IIIb-chronic, stable -Avoid nephrotoxic agents -BMP a.m.  Dementia without behavioral disturbance  depression -Continue home sertraline -Delirium precautions  HLD-chronic, not on home medications  HTN-chronic, moderately well controlled -Continue home metoprolol  Fall at home, initial encounter -PT/OT  Thyroid nodule -Follow-up outpatient ultrasound  BPH -Continue home Flomax  DVT prophylaxis: SCDs Start: 05/06/21 0739   Diet:  Diet Orders (From admission, onward)     Start     Ordered   05/06/21 0727  Diet heart healthy/carb modified Room service appropriate? Yes; Fluid consistency: Thin  Diet effective now       Question Answer Comment  Diet-HS Snack? Nothing   Room service appropriate? Yes   Fluid consistency: Thin      05/06/21 0726            Subjective 05/11/21    Pt reports doing well today.  He really wants to go home.  He has no other complaints.  Disposition Plan & Communication  Patient status: Inpatient  Admitted From: Home Disposition: Skilled nursing facility Anticipated discharge date: 11/29  Family Communication: None Consults, Procedures, Significant Events  Consultants:  None  Procedures/significant events:  None Antimicrobials:  Anti-infectives (From admission, onward)    Start     Dose/Rate Route Frequency Ordered Stop   05/07/21 1000  remdesivir 100 mg in sodium chloride 0.9 % 100 mL IVPB       See Hyperspace for full Linked Orders Report.  100 mg 200 mL/hr over 30 Minutes Intravenous Daily 05/06/21 0649 05/10/21 1000   05/06/21 0700  remdesivir 200 mg in sodium chloride 0.9% 250 mL IVPB       See Hyperspace for full Linked Orders Report.   200 mg 580 mL/hr over 30 Minutes Intravenous Once 05/06/21 0649 05/06/21 1009   05/06/21 0515  Ampicillin-Sulbactam (UNASYN) 3 g in sodium chloride 0.9 %  100 mL IVPB        3 g 200 mL/hr over 30 Minutes Intravenous  Once 05/06/21 0512 05/06/21 0603       Objective   Vitals:   05/10/21 1558 05/10/21 2127 05/11/21 0128 05/11/21 0511  BP: 140/64 127/72 (!) 162/81 140/65  Pulse: 74 100 65 63  Resp:  18 19 19   Temp: (!) 97.4 F (36.3 C) 97.9 F (36.6 C) 97.6 F (36.4 C) 98.6 F (37 C)  TempSrc:      SpO2: 100% 93% 100% 93%  Weight:    85 kg  Height:        Intake/Output Summary (Last 24 hours) at 05/11/2021 0644 Last data filed at 05/11/2021 0130 Gross per 24 hour  Intake 540 ml  Output 900 ml  Net -360 ml   Filed Weights   05/05/21 1528 05/06/21 2245 05/11/21 0511  Weight: 82.6 kg 80.9 kg 85 kg    Patient BMI: Body mass index is 30.25 kg/m.   Physical Exam: General: awake, alert, NAD HEENT: atraumatic, clear conjunctiva, anicteric sclera, moist mucus membranes, hearing grossly normal Respiratory: normal respiratory effort. Cardiovascular: normal S1/S2,  RRR, no JVD, murmurs, rubs, gallops, quick capillary refill  Gastrointestinal: soft, NT, ND, no HSM felt Nervous: A&O x3. no gross focal neurologic deficits, normal speech Extremities: moves all equally, no edema, normal tone Skin: dry, intact, normal temperature, normal color, No rashes, lesions or ulcers Psychiatry: normal mood, congruent affect  Labs   I have personally reviewed following labs and imaging studies Admission on 05/05/2021  Component Date Value Ref Range Status   WBC 05/05/2021 5.1  4.0 - 10.5 K/uL Final   RBC 05/05/2021 4.28  4.22 - 5.81 MIL/uL Final   Hemoglobin 05/05/2021 12.0 (L)  13.0 - 17.0 g/dL Final   HCT 05/05/2021 36.5 (L)  39.0 - 52.0 % Final   MCV 05/05/2021 85.3  80.0 - 100.0 fL Final   MCH 05/05/2021 28.0  26.0 - 34.0 pg Final   MCHC 05/05/2021 32.9  30.0 - 36.0 g/dL Final   RDW 05/05/2021 14.6  11.5 - 15.5 % Final   Platelets 05/05/2021 99 (L)  150 - 400 K/uL Final   nRBC 05/05/2021 0.0  0.0 - 0.2 % Final   Sodium 05/05/2021 137   135 - 145 mmol/L Final   Potassium 05/05/2021 3.8  3.5 - 5.1 mmol/L Final   Chloride 05/05/2021 99  98 - 111 mmol/L Final   CO2 05/05/2021 25  22 - 32 mmol/L Final   Glucose, Bld 05/05/2021 168 (H)  70 - 99 mg/dL Final   BUN 05/05/2021 24 (H)  8 - 23 mg/dL Final   Creatinine, Ser 05/05/2021 1.57 (H)  0.61 - 1.24 mg/dL Final   Calcium 05/05/2021 8.8 (L)  8.9 - 10.3 mg/dL Final   GFR, Estimated 05/05/2021 44 (L)  >60 mL/min Final   Anion gap 05/05/2021 13  5 - 15 Final   Color, Urine 05/06/2021 YELLOW (A)  YELLOW Final   APPearance 05/06/2021 CLEAR (A)  CLEAR Final   Specific Gravity, Urine 05/06/2021  1.013  1.005 - 1.030 Final   pH 05/06/2021 7.0  5.0 - 8.0 Final   Glucose, UA 05/06/2021 NEGATIVE  NEGATIVE mg/dL Final   Hgb urine dipstick 05/06/2021 SMALL (A)  NEGATIVE Final   Bilirubin Urine 05/06/2021 NEGATIVE  NEGATIVE Final   Ketones, ur 05/06/2021 5 (A)  NEGATIVE mg/dL Final   Protein, ur 05/06/2021 >=300 (A)  NEGATIVE mg/dL Final   Nitrite 05/06/2021 NEGATIVE  NEGATIVE Final   Leukocytes,Ua 05/06/2021 NEGATIVE  NEGATIVE Final   RBC / HPF 05/06/2021 6-10  0 - 5 RBC/hpf Final   WBC, UA 05/06/2021 0-5  0 - 5 WBC/hpf Final   Bacteria, UA 05/06/2021 NONE SEEN  NONE SEEN Final   Squamous Epithelial / LPF 05/06/2021 NONE SEEN  0 - 5 Final   Mucus 05/06/2021 PRESENT   Final   Troponin I (High Sensitivity) 05/05/2021 17  <18 ng/L Final   Troponin I (High Sensitivity) 05/06/2021 31 (H)  <18 ng/L Final   Troponin I (High Sensitivity) 05/06/2021 31 (H)  <18 ng/L Final   SARS Coronavirus 2 by RT PCR 05/06/2021 POSITIVE (A)  NEGATIVE Final   Influenza A by PCR 05/06/2021 NEGATIVE  NEGATIVE Final   Influenza B by PCR 05/06/2021 NEGATIVE  NEGATIVE Final   Specimen Description 05/06/2021 BLOOD RIGHT ASSIST CONTROL   Final   Special Requests 05/06/2021 BOTTLES DRAWN AEROBIC AND ANAEROBIC Blood Culture results may not be optimal due to an excessive volume of blood received in culture bottles    Final   Culture 05/06/2021    Final                   Value:NO GROWTH 4 DAYS Performed at [facility], [address] [place], [place] [address]    Report Status 05/06/2021 PENDING   Incomplete   Specimen Description 05/06/2021 BLOOD LEFT FOREARM   Final   Special Requests 05/06/2021 BOTTLES DRAWN AEROBIC AND ANAEROBIC Blood Culture results may not be optimal due to an excessive volume of blood received in culture bottles   Final   Culture 05/06/2021    Final                   Value:NO GROWTH 4 DAYS Performed at [facility], [address], [place], [place] [address]    Report Status 05/06/2021 PENDING   Incomplete   Lactic Acid, Venous 05/06/2021 0.8  0.5 - 1.9 mmol/L Final   Specimen Description 05/06/2021    Final                   Value:URINE, RANDOM Performed at [facility], [address] [place], [place] [address]    Special Requests 05/06/2021    Final                   Value:NONE Performed at [facility], [address] [place], [place] [address]    Culture 05/06/2021    Final                   Value:NO GROWTH Performed at [facility], [place] [address], [place],  [address]    Report Status 05/06/2021 05/07/2021 FINAL   Final   Lactic Acid, Venous 05/06/2021 0.9  0.5 - 1.9 mmol/L Final   ABO/RH(D) 05/06/2021 O POS   Final   Antibody Screen 05/06/2021 NEG   Final   Sample Expiration 05/06/2021    Final  Value:05/09/2021,2359 Performed at [facility], [address] [place], [place] [address]    B Natriuretic Peptide 05/06/2021 562.3 (H)  0.0 - 100.0 pg/mL Final   CRP 05/06/2021 9.3 (H)  <1.0 mg/dL Final   D-Dimer, Quant 05/06/2021 1.32 (H)  0.00 - 0.50 ug/mL-FEU Final   Ferritin 05/06/2021 104  24 - 336 ng/mL Final   Procalcitonin 05/06/2021 <0.10  ng/mL Final   pH, Ven 05/06/2021 7.37  7.250 - 7.430 Final   pCO2, Ven 05/06/2021 44  44.0 - 60.0 mmHg Final   pO2, Ven 05/06/2021 70.0  (H)  32.0 - 45.0 mmHg Final   Bicarbonate 05/06/2021 25.4  20.0 - 28.0 mmol/L Final   Acid-base deficit 05/06/2021 0.1  0.0 - 2.0 mmol/L Final   O2 Saturation 05/06/2021 93.3  % Final   Patient temperature 05/06/2021 37.0   Final   Collection site 05/06/2021 VEIN   Final   Sample type 05/06/2021 VEIN   Final   Hgb A1c MFr Bld 05/06/2021 7.1 (H)  4.8 - 5.6 % Final   Mean Plasma Glucose 05/06/2021 157.07  mg/dL Final   Troponin I (High Sensitivity) 05/06/2021 32 (H)  <18 ng/L Final   Total Protein 05/06/2021 6.4 (L)  6.5 - 8.1 g/dL Final   Albumin 05/06/2021 3.7  3.5 - 5.0 g/dL Final   AST 05/06/2021 18  15 - 41 U/L Final   ALT 05/06/2021 11  0 - 44 U/L Final   Alkaline Phosphatase 05/06/2021 60  38 - 126 U/L Final   Total Bilirubin 05/06/2021 1.1  0.3 - 1.2 mg/dL Final   Bilirubin, Direct 05/06/2021 0.2  0.0 - 0.2 mg/dL Final   Indirect Bilirubin 05/06/2021 0.9  0.3 - 0.9 mg/dL Final   Glucose-Capillary 05/06/2021 210 (H)  70 - 99 mg/dL Final   Troponin I (High Sensitivity) 05/06/2021 56 (H)  <18 ng/L Final   Glucose-Capillary 05/06/2021 209 (H)  70 - 99 mg/dL Final   Troponin I (High Sensitivity) 05/06/2021 52 (H)  <18 ng/L Final   Glucose-Capillary 05/06/2021 188 (H)  70 - 99 mg/dL Final   Cholesterol 05/07/2021 111  0 - 200 mg/dL Final   Triglycerides 05/07/2021 59  <150 mg/dL Final   HDL 05/07/2021 30 (L)  >40 mg/dL Final   Total CHOL/HDL Ratio 05/07/2021 3.7  RATIO Final   VLDL 05/07/2021 12  0 - 40 mg/dL Final   LDL Cholesterol 05/07/2021 69  0 - 99 mg/dL Final   Sodium 05/07/2021 137  135 - 145 mmol/L Final   Potassium 05/07/2021 4.3  3.5 - 5.1 mmol/L Final   Chloride 05/07/2021 101  98 - 111 mmol/L Final   CO2 05/07/2021 26  22 - 32 mmol/L Final   Glucose, Bld 05/07/2021 227 (H)  70 - 99 mg/dL Final   BUN 05/07/2021 41 (H)  8 - 23 mg/dL Final   Creatinine, Ser 05/07/2021 1.70 (H)  0.61 - 1.24 mg/dL Final   Calcium 05/07/2021 8.8 (L)  8.9 - 10.3 mg/dL Final   Total Protein  05/07/2021 6.5  6.5 - 8.1 g/dL Final   Albumin 05/07/2021 3.7  3.5 - 5.0 g/dL Final   AST 05/07/2021 16  15 - 41 U/L Final   ALT 05/07/2021 10  0 - 44 U/L Final   Alkaline Phosphatase 05/07/2021 55  38 - 126 U/L Final   Total Bilirubin 05/07/2021 1.0  0.3 - 1.2 mg/dL Final   GFR, Estimated 05/07/2021 40 (L)  >60 mL/min Final  Anion gap 05/07/2021 10  5 - 15 Final   WBC 05/07/2021 6.0  4.0 - 10.5 K/uL Final   RBC 05/07/2021 4.07 (L)  4.22 - 5.81 MIL/uL Final   Hemoglobin 05/07/2021 11.5 (L)  13.0 - 17.0 g/dL Final   HCT 05/07/2021 34.5 (L)  39.0 - 52.0 % Final   MCV 05/07/2021 84.8  80.0 - 100.0 fL Final   MCH 05/07/2021 28.3  26.0 - 34.0 pg Final   MCHC 05/07/2021 33.3  30.0 - 36.0 g/dL Final   RDW 05/07/2021 14.8  11.5 - 15.5 % Final   Platelets 05/07/2021 88 (L)  150 - 400 K/uL Final   nRBC 05/07/2021 0.0  0.0 - 0.2 % Final   Neutrophils Relative % 05/07/2021 87  % Final   Neutro Abs 05/07/2021 5.3  1.7 - 7.7 K/uL Final   Lymphocytes Relative 05/07/2021 5  % Final   Lymphs Abs 05/07/2021 0.3 (L)  0.7 - 4.0 K/uL Final   Monocytes Relative 05/07/2021 8  % Final   Monocytes Absolute 05/07/2021 0.5  0.1 - 1.0 K/uL Final   Eosinophils Relative 05/07/2021 0  % Final   Eosinophils Absolute 05/07/2021 0.0  0.0 - 0.5 K/uL Final   Basophils Relative 05/07/2021 0  % Final   Basophils Absolute 05/07/2021 0.0  0.0 - 0.1 K/uL Final   Immature Granulocytes 05/07/2021 0  % Final   Abs Immature Granulocytes 05/07/2021 0.02  0.00 - 0.07 K/uL Final   CRP 05/07/2021 15.2 (H)  <1.0 mg/dL Final   Glucose-Capillary 05/06/2021 124 (H)  70 - 99 mg/dL Final   Glucose-Capillary 05/07/2021 220 (H)  70 - 99 mg/dL Final   Glucose-Capillary 05/07/2021 282 (H)  70 - 99 mg/dL Final   Glucose-Capillary 05/07/2021 365 (H)  70 - 99 mg/dL Final   Sodium 05/08/2021 133 (L)  135 - 145 mmol/L Final   Potassium 05/08/2021 5.1  3.5 - 5.1 mmol/L Final   Chloride 05/08/2021 97 (L)  98 - 111 mmol/L Final   CO2  05/08/2021 29  22 - 32 mmol/L Final   Glucose, Bld 05/08/2021 191 (H)  70 - 99 mg/dL Final   BUN 05/08/2021 54 (H)  8 - 23 mg/dL Final   Creatinine, Ser 05/08/2021 1.70 (H)  0.61 - 1.24 mg/dL Final   Calcium 05/08/2021 8.7 (L)  8.9 - 10.3 mg/dL Final   GFR, Estimated 05/08/2021 40 (L)  >60 mL/min Final   Anion gap 05/08/2021 7  5 - 15 Final   WBC 05/08/2021 6.7  4.0 - 10.5 K/uL Final   RBC 05/08/2021 4.30  4.22 - 5.81 MIL/uL Final   Hemoglobin 05/08/2021 12.0 (L)  13.0 - 17.0 g/dL Final   HCT 05/08/2021 35.9 (L)  39.0 - 52.0 % Final   MCV 05/08/2021 83.5  80.0 - 100.0 fL Final   MCH 05/08/2021 27.9  26.0 - 34.0 pg Final   MCHC 05/08/2021 33.4  30.0 - 36.0 g/dL Final   RDW 05/08/2021 14.7  11.5 - 15.5 % Final   Platelets 05/08/2021 100 (L)  150 - 400 K/uL Final   nRBC 05/08/2021 0.0  0.0 - 0.2 % Final   CRP 05/08/2021 9.1 (H)  <1.0 mg/dL Final   Glucose-Capillary 05/07/2021 245 (H)  70 - 99 mg/dL Final   Glucose-Capillary 05/08/2021 205 (H)  70 - 99 mg/dL Final   Glucose-Capillary 05/08/2021 269 (H)  70 - 99 mg/dL Final   Glucose-Capillary 05/08/2021 397 (H)  70 - 99 mg/dL Final  Sodium 05/09/2021 135  135 - 145 mmol/L Final   Potassium 05/09/2021 3.9  3.5 - 5.1 mmol/L Final   Chloride 05/09/2021 99  98 - 111 mmol/L Final   CO2 05/09/2021 27  22 - 32 mmol/L Final   Glucose, Bld 05/09/2021 111 (H)  70 - 99 mg/dL Final   BUN 05/09/2021 57 (H)  8 - 23 mg/dL Final   Creatinine, Ser 05/09/2021 1.77 (H)  0.61 - 1.24 mg/dL Final   Calcium 05/09/2021 9.0  8.9 - 10.3 mg/dL Final   GFR, Estimated 05/09/2021 38 (L)  >60 mL/min Final   Anion gap 05/09/2021 9  5 - 15 Final   Glucose-Capillary 05/08/2021 419 (H)  70 - 99 mg/dL Final   Glucose-Capillary 05/08/2021 396 (H)  70 - 99 mg/dL Final   Glucose-Capillary 05/09/2021 154 (H)  70 - 99 mg/dL Final   Weight 05/09/2021 2,853.63  oz Final   Height 05/09/2021 66  in Final   BP 05/09/2021 142/80  mmHg Final   Ao pk vel 05/09/2021 1.11  m/s  Final   AV Area VTI 05/09/2021 2.84  cm2 Final   AR max vel 05/09/2021 2.74  cm2 Final   AV Mean grad 05/09/2021 3.5  mmHg Final   AV Peak grad 05/09/2021 5.0  mmHg Final   S' Lateral 05/09/2021 3.00  cm Final   AV Area mean vel 05/09/2021 2.25  cm2 Final   Area-P 1/2 05/09/2021 5.16  cm2 Final   MV VTI 05/09/2021 2.56  cm2 Final   Glucose-Capillary 05/09/2021 177 (H)  70 - 99 mg/dL Final   Glucose-Capillary 05/09/2021 353 (H)  70 - 99 mg/dL Final   Glucose-Capillary 05/09/2021 307 (H)  70 - 99 mg/dL Final   Glucose-Capillary 05/10/2021 113 (H)  70 - 99 mg/dL Final   Glucose-Capillary 05/10/2021 182 (H)  70 - 99 mg/dL Final   Glucose-Capillary 05/10/2021 256 (H)  70 - 99 mg/dL Final   Glucose-Capillary 05/10/2021 208 (H)  70 - 99 mg/dL Final    Imaging Studies  No results found. Medications   Scheduled Meds:  cholecalciferol  1,000 Units Oral Daily   gabapentin  100 mg Oral QHS   insulin aspart  0-5 Units Subcutaneous QHS   insulin aspart  0-9 Units Subcutaneous TID WC   insulin glargine-yfgn  29 Units Subcutaneous QHS   Ipratropium-Albuterol  2 puff Inhalation Q6H   metoprolol succinate  50 mg Oral Daily   multivitamin-lutein  1 capsule Oral BID   pantoprazole  40 mg Oral Daily   sertraline  200 mg Oral q AM   tamsulosin  0.8 mg Oral QHS   vitamin B-12  1,000 mcg Oral Daily   No recently discontinued medications to reconcile  LOS: 5 days   Time spent: >53mn  CRicharda Osmond DO [facility] 05/11/2021, 6:44 AM   Please refer to amion to contact the [facility] or Consulting provider for this pt  [email or web address] Available by Epic secure chat 7AM-7PM. If 7PM-7AM, please contact night-coverage

## 2021-05-12 ENCOUNTER — Encounter: Payer: Self-pay | Admitting: Internal Medicine

## 2021-05-12 DIAGNOSIS — I5033 Acute on chronic diastolic (congestive) heart failure: Secondary | ICD-10-CM | POA: Diagnosis not present

## 2021-05-12 DIAGNOSIS — E041 Nontoxic single thyroid nodule: Secondary | ICD-10-CM

## 2021-05-12 DIAGNOSIS — J9601 Acute respiratory failure with hypoxia: Secondary | ICD-10-CM | POA: Diagnosis not present

## 2021-05-12 DIAGNOSIS — W19XXXA Unspecified fall, initial encounter: Secondary | ICD-10-CM | POA: Diagnosis not present

## 2021-05-12 DIAGNOSIS — U071 COVID-19: Secondary | ICD-10-CM | POA: Diagnosis not present

## 2021-05-12 DIAGNOSIS — N183 Chronic kidney disease, stage 3 unspecified: Secondary | ICD-10-CM

## 2021-05-12 DIAGNOSIS — I2510 Atherosclerotic heart disease of native coronary artery without angina pectoris: Secondary | ICD-10-CM

## 2021-05-12 LAB — BASIC METABOLIC PANEL
Anion gap: 8 (ref 5–15)
BUN: 70 mg/dL — ABNORMAL HIGH (ref 8–23)
CO2: 26 mmol/L (ref 22–32)
Calcium: 8.9 mg/dL (ref 8.9–10.3)
Chloride: 101 mmol/L (ref 98–111)
Creatinine, Ser: 2.16 mg/dL — ABNORMAL HIGH (ref 0.61–1.24)
GFR, Estimated: 30 mL/min — ABNORMAL LOW (ref >60)
Glucose, Bld: 116 mg/dL — ABNORMAL HIGH (ref 70–99)
Potassium: 4.1 mmol/L (ref 3.5–5.1)
Sodium: 135 mmol/L (ref 135–145)

## 2021-05-12 LAB — GLUCOSE, CAPILLARY
Glucose-Capillary: 119 mg/dL — ABNORMAL HIGH (ref 70–99)
Glucose-Capillary: 244 mg/dL — ABNORMAL HIGH (ref 70–99)
Glucose-Capillary: 82 mg/dL (ref 70–99)
Glucose-Capillary: 102 mg/dL — ABNORMAL HIGH (ref 70–99)

## 2021-05-12 NOTE — Progress Notes (Signed)
PROGRESS NOTE  Peter Robertson    DOB: 01/21/1941, 80 y.o.  OKO:[id number]  PCP: Jenell Milliner, MD   Code Status: Full Code   DOA: 05/05/2021   LOS: 6  Brief Narrative of Current Hospitalization  Peter Robertson is a 79 y.o. male with a PMH significant for HTN, Type 2 DM, CKDIIIa, HFpEF, CAD, dementia. They presented from home to the ED on 05/05/2021 with fall and head injury.  In the ED, it was found that they had acute respiratory distress with COVID and fluid overload. They were treated with oxygen, BiPAP, steroids, breathing treatments, remdesivir.  Patient was admitted to medicine service for further workup and management of acute hypoxic respiratory failure in setting of COVID as outlined in detail below.  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  Completed remdesivir.  Patient able to discharge to SNF once COVID-19 isolation precautions met by SNF.  Patient strongly wishes to go home. -PT/OT to continue evaluation for improvement and treatments to increase strength -Mucinex as needed -Combivent twice daily  Type II DM-hemoglobin A1c 7.1 on admission which is below goal of 8 for age.  Blood sugars well controlled, were elevated early on admission due to steroid administration. -Changing insulin  administration to a.m. dosing for better safety profile  -30 units Semglee a.m. -Can likely discontinue sliding scale if blood sugars are well controlled tomorrow.  CKD IIIb-chronic, stable -Avoid nephrotoxic agents -BMP a.m.  Dementia without behavioral disturbance  depression -Continue home sertraline -Delirium precautions  HLD-chronic, not on home medications  HTN-chronic, moderately well controlled -Continue home metoprolol  Fall at home, initial encounter -PT/OT  Thyroid nodule -ordered US to be done while inpatient- once isolation precautions have been discontinued (11/28)  BPH -Continue home Flomax  DVT prophylaxis: SCDs Start: 05/06/21 0739   Diet:  Diet Orders (From admission, onward)     Start     Ordered   05/06/21 0727  Diet heart healthy/carb modified Room service appropriate? Yes; Fluid consistency: Thin  Diet effective now       Question Answer Comment  Diet-HS Snack? Nothing   Room service appropriate? Yes   Fluid consistency: Thin      05/06/21 0726            Subjective 05/12/21    Pt reports no complaints today. He got a shave and bath today.  Disposition Plan & Communication  Patient status: Inpatient  Admitted From: Home Disposition: Skilled nursing facility Anticipated discharge date: 11/29  Family Communication: None Consults, Procedures, Significant Events  Consultants:  None  Procedures/significant events:  None Antimicrobials:  Anti-infectives (From admission, onward)    Start     Dose/Rate Route Frequency Ordered Stop   05/07/21 1000  remdesivir 100 mg in sodium chloride 0.9 % 100 mL IVPB  See Hyperspace for full Linked Orders Report.   100 mg 200 mL/hr over 30 Minutes Intravenous Daily 05/06/21 0649 05/10/21 1000   05/06/21 0700  remdesivir 200 mg in sodium chloride 0.9% 250 mL IVPB       See Hyperspace for full Linked Orders Report.   200 mg 580 mL/hr over 30 Minutes Intravenous Once 05/06/21 0649 05/06/21  1009   05/06/21 0515  Ampicillin-Sulbactam (UNASYN) 3 g in sodium chloride 0.9 % 100 mL IVPB        3 g 200 mL/hr over 30 Minutes Intravenous  Once 05/06/21 0512 05/06/21 0603       Objective   Vitals:   05/11/21 2045 05/12/21 0032 05/12/21 0335 05/12/21 0337  BP: (!) 121/57 116/79  (!) 132/118  Pulse: 69 66  73  Resp: _0 Temp: 98.4 F (36.9 C) 97.9 F (36.6 C) 97.6 F (36.4 C) 97.6 F (36.4 C)  TempSrc:      SpO2: 93% 98%  100%  Weight:      Height:        Intake/Output Summary (Last 24 hours) at 05/12/2021 0653 Last data filed at 05/11/2021 1758 Gross per 24 hour  Intake 720 ml  Output 600 ml  Net 120 ml    Filed Weights   05/05/21 1528 05/06/21 2245 05/11/21 0511  Weight: 82.6 kg 80.9 kg 85 kg    Patient BMI: Body mass index is 30.25 kg/m.   Physical Exam: General: awake, alert, NAD HEENT: atraumatic, clear conjunctiva, anicteric sclera, moist mucus membranes, hearing grossly normal Respiratory: normal respiratory effort. Cardiovascular: normal S1/S2,  RRR, no JVD, murmurs, rubs, gallops, quick capillary refill  Gastrointestinal: soft, NT, ND, no HSM felt Nervous: A&O x3. no gross focal neurologic deficits, normal speech Extremities: moves all equally, no edema, normal tone Skin: dry, intact, normal temperature, normal color, No rashes, lesions or ulcers Psychiatry: normal mood, congruent affect  Labs   I have personally reviewed following labs and imaging studies Admission on 05/05/2021  Component Date Value Ref Range Status   WBC 05/05/2021 5.1  4.0 - 10.5 K/uL Final   RBC 05/05/2021 4.28  4.22 - 5.81 MIL/uL Final   Hemoglobin 05/05/2021 12.0 (L)  13.0 - 17.0 g/dL Final   HCT 05/05/2021 36.5 (L)  39.0 - 52.0 % Final   MCV 05/05/2021 85.3  80.0 - 100.0 fL Final   MCH 05/05/2021 28.0  26.0 - 34.0 pg Final   MCHC 05/05/2021 32.9  30.0 - 36.0 g/dL Final   RDW 05/05/2021 14.6  11.5 - 15.5 % Final   Platelets 05/05/2021 99 (L)  150 - 400 K/uL Final    nRBC 05/05/2021 0.0  0.0 - 0.2 % Final   Sodium 05/05/2021 137  135 - 145 mmol/L Final   Potassium 05/05/2021 3.8  3.5 - 5.1 mmol/L Final   Chloride 05/05/2021 99  98 - 111 mmol/L Final   CO2 05/05/2021 25  22 - 32 mmol/L Final   Glucose, Bld 05/05/2021 168 (H)  70 - 99 mg/dL Final   BUN 05/05/2021 24 (H)  8 - 23 mg/dL Final   Creatinine, Ser 05/05/2021 1.57 (H)  0.61 - 1.24 mg/dL Final   Calcium 05/05/2021 8.8 (L)  8.9 - 10.3 mg/dL Final   GFR, Estimated 05/05/2021 44 (L)  >60 mL/min Final   Anion gap 05/05/2021 13  5 - 15 Final   Color, Urine 05/06/2021 YELLOW (A)  YELLOW Final   APPearance 05/06/2021 CLEAR (A)  CLEAR Final   Specific Gravity, Urine 05/06/2021 1.013  1.005 - 1.030 Final   pH 05/06/2021 7.0  5.0 - 8.0 Final   Glucose, UA 05/06/2021 NEGATIVE  NEGATIVE mg/dL Final   Hgb urine dipstick 05/06/2021 SMALL (A)  NEGATIVE Final   Bilirubin Urine 05/06/2021 NEGATIVE  NEGATIVE Final   Ketones, ur 05/06/2021 5 (A)  NEGATIVE mg/dL Final   Protein, ur 05/06/2021 >=300 (A)  NEGATIVE mg/dL Final   Nitrite 05/06/2021 NEGATIVE  NEGATIVE Final   Leukocytes,Ua 05/06/2021 NEGATIVE  NEGATIVE Final   RBC / HPF 05/06/2021 6-10  0 - 5 RBC/hpf Final   WBC, UA 05/06/2021 0-5  0 - 5 WBC/hpf Final   Bacteria, UA 05/06/2021 NONE SEEN  NONE SEEN Final   Squamous Epithelial / LPF 05/06/2021 NONE SEEN  0 - 5 Final   Mucus 05/06/2021 PRESENT   Final   Troponin I (High Sensitivity) 05/05/2021 17  <18 ng/L Final   Troponin I (High Sensitivity) 05/06/2021 31 (H)  <18 ng/L Final   Troponin I (High Sensitivity) 05/06/2021 31 (H)  <18 ng/L Final   SARS Coronavirus 2 by RT PCR 05/06/2021 POSITIVE (A)  NEGATIVE Final   Influenza A by PCR 05/06/2021 NEGATIVE  NEGATIVE Final   Influenza B by PCR 05/06/2021 NEGATIVE  NEGATIVE Final   Specimen Description 05/06/2021 BLOOD RIGHT ASSIST CONTROL   Final   Special Requests 05/06/2021 BOTTLES DRAWN AEROBIC AND ANAEROBIC Blood Culture results may not be optimal  due to an excessive volume of blood received in culture bottles   Final   Culture 05/06/2021    Final                   Value:NO GROWTH 5 DAYS Performed at [facility], [address], [place], [place] [address]    Report Status 05/06/2021 05/11/2021 FINAL   Final   Specimen Description 05/06/2021 BLOOD LEFT FOREARM   Final   Special Requests 05/06/2021 BOTTLES DRAWN AEROBIC AND ANAEROBIC Blood Culture results may not be optimal due to an excessive volume of blood received in culture bottles   Final   Culture 05/06/2021    Final                   Value:NO GROWTH 5 DAYS Performed at [facility], [address], [place], [place] [address]    Report Status 05/06/2021 05/11/2021 FINAL   Final   Lactic Acid, Venous 05/06/2021 0.8  0.5 - 1.9 mmol/L Final   Specimen Description 05/06/2021    Final                   Value:URINE, RANDOM Performed at [facility], [address], [place], [place] [address]    Special Requests 05/06/2021    Final                   Value:NONE Performed at [facility], [address] [place], [place] [address]    Culture 05/06/2021    Final                   Value:NO GROWTH Performed at [facility], [place] [address] [place], [place] [address]    Report Status 05/06/2021 05/07/2021 FINAL   Final   Lactic Acid, Venous 05/06/2021 0.9  0.5 - 1.9 mmol/L Final   ABO/RH(D) 05/06/2021 O POS   Final   Antibody Screen 05/06/2021 NEG   Final   Sample Expiration 05/06/2021  Final                   Value:05/09/2021,2359 Performed at [facility], [address] [place], [place] [address]    B Natriuretic Peptide 05/06/2021 562.3 (H)  0.0 - 100.0 pg/mL Final   CRP 05/06/2021 9.3 (H)  <1.0 mg/dL Final   D-Dimer, Quant 05/06/2021 1.32 (H)  0.00 - 0.50 ug/mL-FEU Final   Ferritin 05/06/2021 104  24 - 336 ng/mL Final   Procalcitonin 05/06/2021 <0.10  ng/mL Final   pH, Ven 05/06/2021 7.37  7.250 - 7.430 Final    pCO2, Ven 05/06/2021 44  44.0 - 60.0 mmHg Final   pO2, Ven 05/06/2021 70.0 (H)  32.0 - 45.0 mmHg Final   Bicarbonate 05/06/2021 25.4  20.0 - 28.0 mmol/L Final   Acid-base deficit 05/06/2021 0.1  0.0 - 2.0 mmol/L Final   O2 Saturation 05/06/2021 93.3  % Final   Patient temperature 05/06/2021 37.0   Final   Collection site 05/06/2021 VEIN   Final   Sample type 05/06/2021 VEIN   Final   Hgb A1c MFr Bld 05/06/2021 7.1 (H)  4.8 - 5.6 % Final   Mean Plasma Glucose 05/06/2021 157.07  mg/dL Final   Troponin I (High Sensitivity) 05/06/2021 32 (H)  <18 ng/L Final   Total Protein 05/06/2021 6.4 (L)  6.5 - 8.1 g/dL Final   Albumin 05/06/2021 3.7  3.5 - 5.0 g/dL Final   AST 05/06/2021 18  15 - 41 U/L Final   ALT 05/06/2021 11  0 - 44 U/L Final   Alkaline Phosphatase 05/06/2021 60  38 - 126 U/L Final   Total Bilirubin 05/06/2021 1.1  0.3 - 1.2 mg/dL Final   Bilirubin, Direct 05/06/2021 0.2  0.0 - 0.2 mg/dL Final   Indirect Bilirubin 05/06/2021 0.9  0.3 - 0.9 mg/dL Final   Glucose-Capillary 05/06/2021 210 (H)  70 - 99 mg/dL Final   Troponin I (High Sensitivity) 05/06/2021 56 (H)  <18 ng/L Final   Glucose-Capillary 05/06/2021 209 (H)  70 - 99 mg/dL Final   Troponin I (High Sensitivity) 05/06/2021 52 (H)  <18 ng/L Final   Glucose-Capillary 05/06/2021 188 (H)  70 - 99 mg/dL Final   Cholesterol 05/07/2021 111  0 - 200 mg/dL Final   Triglycerides 05/07/2021 59  <150 mg/dL Final   HDL 05/07/2021 30 (L)  >40 mg/dL Final   Total CHOL/HDL Ratio 05/07/2021 3.7  RATIO Final   VLDL 05/07/2021 12  0 - 40 mg/dL Final   LDL Cholesterol 05/07/2021 69  0 - 99 mg/dL Final   Sodium 05/07/2021 137  135 - 145 mmol/L Final   Potassium 05/07/2021 4.3  3.5 - 5.1 mmol/L Final   Chloride 05/07/2021 101  98 - 111 mmol/L Final   CO2 05/07/2021 26  22 - 32 mmol/L Final   Glucose, Bld 05/07/2021 227 (H)  70 - 99 mg/dL Final   BUN 05/07/2021 41 (H)  8 - 23 mg/dL Final   Creatinine, Ser 05/07/2021 1.70 (H)  0.61 - 1.24 mg/dL  Final   Calcium 05/07/2021 8.8 (L)  8.9 - 10.3 mg/dL Final   Total Protein 05/07/2021 6.5  6.5 - 8.1 g/dL Final   Albumin 05/07/2021 3.7  3.5 - 5.0 g/dL Final   AST 05/07/2021 16  15 - 41 U/L Final   ALT 05/07/2021 10  0 - 44 U/L Final   Alkaline Phosphatase 05/07/2021 55  38 - 126 U/L Final   Total Bilirubin 05/07/2021 1.0  0.3 - 1.2 mg/dL Final   GFR, Estimated 05/07/2021 40 (L)  >60 mL/min Final   Anion gap 05/07/2021 10  5 - 15 Final   WBC 05/07/2021 6.0  4.0 - 10.5 K/uL Final   RBC 05/07/2021 4.07 (L)  4.22 - 5.81 MIL/uL Final   Hemoglobin 05/07/2021 11.5 (L)  13.0 - 17.0 g/dL Final   HCT 05/07/2021 34.5 (L)  39.0 - 52.0 % Final   MCV 05/07/2021 84.8  80.0 - 100.0 fL Final   MCH 05/07/2021 28.3  26.0 - 34.0 pg Final   MCHC 05/07/2021 33.3  30.0 - 36.0 g/dL Final   RDW 05/07/2021 14.8  11.5 - 15.5 % Final   Platelets 05/07/2021 88 (L)  150 - 400 K/uL Final   nRBC 05/07/2021 0.0  0.0 - 0.2 % Final   Neutrophils Relative % 05/07/2021 87  % Final   Neutro Abs 05/07/2021 5.3  1.7 - 7.7 K/uL Final   Lymphocytes Relative 05/07/2021 5  % Final   Lymphs Abs 05/07/2021 0.3 (L)  0.7 - 4.0 K/uL Final   Monocytes Relative 05/07/2021 8  % Final   Monocytes Absolute 05/07/2021 0.5  0.1 - 1.0 K/uL Final   Eosinophils Relative 05/07/2021 0  % Final   Eosinophils Absolute 05/07/2021 0.0  0.0 - 0.5 K/uL Final   Basophils Relative 05/07/2021 0  % Final   Basophils Absolute 05/07/2021 0.0  0.0 - 0.1 K/uL Final   Immature Granulocytes 05/07/2021 0  % Final   Abs Immature Granulocytes 05/07/2021 0.02  0.00 - 0.07 K/uL Final   CRP 05/07/2021 15.2 (H)  <1.0 mg/dL Final   Glucose-Capillary 05/06/2021 124 (H)  70 - 99 mg/dL Final   Glucose-Capillary 05/07/2021 220 (H)  70 - 99 mg/dL Final   Glucose-Capillary 05/07/2021 282 (H)  70 - 99 mg/dL Final   Glucose-Capillary 05/07/2021 365 (H)  70 - 99 mg/dL Final   Sodium 05/08/2021 133 (L)  135 - 145 mmol/L Final   Potassium 05/08/2021 5.1  3.5 - 5.1  mmol/L Final   Chloride 05/08/2021 97 (L)  98 - 111 mmol/L Final   CO2 05/08/2021 29  22 - 32 mmol/L Final   Glucose, Bld 05/08/2021 191 (H)  70 - 99 mg/dL Final   BUN 05/08/2021 54 (H)  8 - 23 mg/dL Final   Creatinine, Ser 05/08/2021 1.70 (H)  0.61 - 1.24 mg/dL Final   Calcium 05/08/2021 8.7 (L)  8.9 - 10.3 mg/dL Final   GFR, Estimated 05/08/2021 40 (L)  >60 mL/min Final   Anion gap 05/08/2021 7  5 - 15 Final   WBC 05/08/2021 6.7  4.0 - 10.5 K/uL Final   RBC 05/08/2021 4.30  4.22 - 5.81 MIL/uL Final   Hemoglobin 05/08/2021 12.0 (L)  13.0 - 17.0 g/dL Final   HCT 05/08/2021 35.9 (L)  39.0 - 52.0 % Final   MCV 05/08/2021 83.5  80.0 - 100.0 fL Final   MCH 05/08/2021 27.9  26.0 - 34.0 pg Final   MCHC 05/08/2021 33.4  30.0 - 36.0 g/dL Final   RDW 05/08/2021 14.7  11.5 - 15.5 % Final   Platelets 05/08/2021 100 (L)  150 - 400 K/uL Final   nRBC 05/08/2021 0.0  0.0 - 0.2 % Final   CRP 05/08/2021 9.1 (H)  <1.0 mg/dL Final   Glucose-Capillary 05/07/2021 245 (H)  70 - 99 mg/dL Final   Glucose-Capillary 05/08/2021 205 (H)  70 - 99 mg/dL Final   Glucose-Capillary 05/08/2021 269 (H)  70 - 99 mg/dL Final   Glucose-Capillary 05/08/2021 397 (H)  70 - 99 mg/dL Final   Sodium 05/09/2021 135  135 - 145 mmol/L Final   Potassium 05/09/2021 3.9  3.5 - 5.1 mmol/L Final   Chloride 05/09/2021 99  98 - 111 mmol/L Final   CO2 05/09/2021 27  22 - 32 mmol/L Final   Glucose, Bld 05/09/2021 111 (H)  70 - 99 mg/dL Final   BUN 05/09/2021 57 (H)  8 - 23 mg/dL Final   Creatinine, Ser 05/09/2021 1.77 (H)  0.61 - 1.24 mg/dL Final   Calcium 05/09/2021 9.0  8.9 - 10.3 mg/dL Final   GFR, Estimated 05/09/2021 38 (L)  >60 mL/min Final   Anion gap 05/09/2021 9  5 - 15 Final   Glucose-Capillary 05/08/2021 419 (H)  70 - 99 mg/dL Final   Glucose-Capillary 05/08/2021 396 (H)  70 - 99 mg/dL Final   Glucose-Capillary 05/09/2021 154 (H)  70 - 99 mg/dL Final   Weight 05/09/2021 2,853.63  oz Final   Height 05/09/2021 66  in Final    BP 05/09/2021 142/80  mmHg Final   Ao pk vel 05/09/2021 1.11  m/s Final   AV Area VTI 05/09/2021 2.84  cm2 Final   AR max vel 05/09/2021 2.74  cm2 Final   AV Mean grad 05/09/2021 3.5  mmHg Final   AV Peak grad 05/09/2021 5.0  mmHg Final   S' Lateral 05/09/2021 3.00  cm Final   AV Area mean vel 05/09/2021 2.25  cm2 Final   Area-P 1/2 05/09/2021 5.16  cm2 Final   MV VTI 05/09/2021 2.56  cm2 Final   Glucose-Capillary 05/09/2021 177 (H)  70 - 99 mg/dL Final   Glucose-Capillary 05/09/2021 353 (H)  70 - 99 mg/dL Final   Glucose-Capillary 05/09/2021 307 (H)  70 - 99 mg/dL Final   Glucose-Capillary 05/10/2021 113 (H)  70 - 99 mg/dL Final   Glucose-Capillary 05/10/2021 182 (H)  70 - 99 mg/dL Final   Glucose-Capillary 05/10/2021 256 (H)  70 - 99 mg/dL Final   Glucose-Capillary 05/10/2021 208 (H)  70 - 99 mg/dL Final   Glucose-Capillary 05/11/2021 139 (H)  70 - 99 mg/dL Final   Glucose-Capillary 05/11/2021 118 (H)  70 - 99 mg/dL Final   Glucose-Capillary 05/11/2021 156 (H)  70 - 99 mg/dL Final   Glucose-Capillary 05/11/2021 186 (H)  70 - 99 mg/dL Final    Imaging Studies  No results found. Medications   Scheduled Meds:  cholecalciferol  1,000 Units Oral Daily   gabapentin  100 mg Oral QHS   insulin aspart  0-9 Units Subcutaneous TID WC   insulin glargine-yfgn  30 Units Subcutaneous Daily   Ipratropium-Albuterol  2 puff Inhalation BID   metoprolol succinate  50 mg Oral Daily   multivitamin-lutein  1 capsule Oral BID   pantoprazole  40 mg Oral Daily   sertraline  200 mg Oral q AM   tamsulosin  0.8 mg Oral QHS   vitamin B-12  1,000 mcg Oral Daily   No recently discontinued medications to reconcile  LOS: 6 days   Time spent: >31mn  Kendrea Cerritos L Izyan Ezzell, DO [facility] 05/12/2021, 6:53 AM   Please refer to amion to contact the [facility] Institute[facility]ending or Consulting provider for this pt  [email or web address] Available by Epic secure chat 7AM-7PM. If 7PM-7AM, please contact  night-coverage

## 2021-05-13 ENCOUNTER — Encounter: Payer: Self-pay | Admitting: Internal Medicine

## 2021-05-13 DIAGNOSIS — N400 Enlarged prostate without lower urinary tract symptoms: Secondary | ICD-10-CM | POA: Diagnosis not present

## 2021-05-13 DIAGNOSIS — J9601 Acute respiratory failure with hypoxia: Secondary | ICD-10-CM | POA: Diagnosis not present

## 2021-05-13 DIAGNOSIS — W19XXXA Unspecified fall, initial encounter: Secondary | ICD-10-CM | POA: Diagnosis not present

## 2021-05-13 DIAGNOSIS — I10 Essential (primary) hypertension: Secondary | ICD-10-CM

## 2021-05-13 DIAGNOSIS — U071 COVID-19: Secondary | ICD-10-CM | POA: Diagnosis not present

## 2021-05-13 LAB — GLUCOSE, CAPILLARY
Glucose-Capillary: 140 mg/dL — ABNORMAL HIGH (ref 70–99)
Glucose-Capillary: 385 mg/dL — ABNORMAL HIGH (ref 70–99)
Glucose-Capillary: 309 mg/dL — ABNORMAL HIGH (ref 70–99)
Glucose-Capillary: 265 mg/dL — ABNORMAL HIGH (ref 70–99)

## 2021-05-13 MED ORDER — ENOXAPARIN SODIUM 30 MG/0.3ML IJ SOSY
30.0000 mg | PREFILLED_SYRINGE | INTRAMUSCULAR | Status: DC
  Administered 2021-05-13 – 2021-05-22 (×10): 30 mg via SUBCUTANEOUS
  Filled 2021-05-13 (×10): qty 0.3

## 2021-05-13 NOTE — Plan of Care (Signed)

## 2021-05-13 NOTE — Progress Notes (Signed)
Physical Therapy Treatment Patient Details Name: Peter Robertson MRN: [id number] DOB: May 15, 1941 Today's Date: 05/13/2021   History of Present Illness Pt admitted for ARF secondary to covid with complaints of falls and SOB symptoms. History includes HTN, HLD, DM, GERD, and MI.    PT Comments    Pt received in Semi-Fowler's position and agreeable to therapy.  Pt with much less stability during session today and noted to be fatigued at the conclusion of session.  Pt still struggling with DME management and proper sequencing even with consistent multimodal cuing throughout the session.  Pt demonstrated fatigue when hurrying back to room and "plopping" on the bed and self-reporting that he was tired.  Ot in room upon leaving with all needs met at this time.  Pt would still benefit from d/c home with services to assist and supervision at this time.     Recommendations for follow up therapy are one component of a multi-disciplinary discharge planning process, led by the attending physician.  Recommendations may be updated based on patient status, additional functional criteria and insurance authorization.  Follow Up Recommendations  Home health PT     Assistance Recommended at Discharge Frequent or constant Supervision/Assistance  Equipment Recommendations  None recommended by PT    Recommendations for Other Services       Precautions / Restrictions Precautions Precautions: Fall Precaution Comments: airborne, droplet precautions Restrictions Weight Bearing Restrictions: No     Mobility  Bed Mobility Overal bed mobility: Modified Independent Bed Mobility: Sit to Supine     Supine to sit: Modified independent (Device/Increase time) Sit to supine: Modified independent (Device/Increase time)        Transfers Overall transfer level: Needs assistance Equipment used: Rolling walker (2 wheels) Transfers: Sit to/from Stand Sit to Stand: Min guard     Step pivot transfers: Min  guard          Ambulation/Gait Ambulation/Gait assistance: Herbalist (Feet): 260 Feet Assistive device: Rolling walker (2 wheels) Gait Pattern/deviations: Step-through pattern;Decreased step length - right;Decreased step length - left;Drifts right/left Gait velocity: decreased     General Gait Details: Pt able to ambulate longer distance with decreased safety awareness at this time.  Pt continues to have difficulty with drifting and not being compliant with maintaining body control within the walker.   Stairs             Wheelchair Mobility    Modified Rankin (Stroke Patients Only)       Balance Overall balance assessment: History of Falls;Needs assistance Sitting-balance support: Feet supported;No upper extremity supported Sitting balance-Leahy Scale: Good     Standing balance support: Bilateral upper extremity supported;During functional activity Standing balance-Leahy Scale: Good Standing balance comment: pt with less stability during treatment session today.                            Cognition Arousal/Alertness: Awake/alert Behavior During Therapy: WFL for tasks assessed/performed Overall Cognitive Status: No family/caregiver present to determine baseline cognitive functioning                                 General Comments: decreased safety awareness        Exercises      General Comments        Pertinent Vitals/Pain Pain Assessment: No/denies pain    Home Living  Prior Function            PT Goals (current goals can now be found in the care plan section) Acute Rehab PT Goals Patient Stated Goal: to get stronger PT Goal Formulation: With patient Time For Goal Achievement: 05/21/21 Potential to Achieve Goals: Good Progress towards PT goals: Progressing toward goals    Frequency    Min 2X/week      PT Plan Current plan remains appropriate     Co-evaluation              AM-PAC PT "6 Clicks" Mobility   Outcome Measure  Help needed turning from your back to your side while in a flat bed without using bedrails?: A Little Help needed moving from lying on your back to sitting on the side of a flat bed without using bedrails?: A Little Help needed moving to and from a bed to a chair (including a wheelchair)?: A Little Help needed standing up from a chair using your arms (e.g., wheelchair or bedside chair)?: A Little Help needed to walk in hospital room?: A Little Help needed climbing 3-5 steps with a railing? : A Lot 6 Click Score: 17    End of Session Equipment Utilized During Treatment: Gait belt Activity Tolerance: Patient tolerated treatment well Patient left: in chair;with chair alarm set;with call bell/phone within reach Nurse Communication: Mobility status PT Visit Diagnosis: Muscle weakness (generalized) (M62.81);Difficulty in walking, not elsewhere classified (R26.2)     Time: 3403-5248 PT Time Calculation (min) (ACUTE ONLY): 23 min  Charges:  $Gait Training: 23-37 mins                     Gwenlyn Saran, PT, DPT 05/13/21, 10:43 PM    [name] 05/13/2021, 10:41 PM

## 2021-05-13 NOTE — Progress Notes (Signed)
Occupational Therapy Treatment Patient Details Name: Peter Robertson MRN: [id number] DOB: 04/05/1941 Today's Date: 05/13/2021   History of present illness Pt admitted for ARF secondary to covid with complaints of falls and SOB symptoms. History includes HTN, HLD, DM, GERD, and MI.   OT comments  Pt seen for OT tx directly following PT session where he ambulated 2 laps around the nurses station. Once back in the room pt engaged in seated grooming tasks EOB only requiring set up and supervision for safety. Pt tolerated well, denied difficulty. VSS. Pt declined toileting or additional ADL. Mod indep return to bed at end. Pt progressing towards goals. Continues to demonstrate some impaired safety awareness but functionally progressing to point he could potentially go home with increased assist initially at least to maximize safety.    Recommendations for follow up therapy are one component of a multi-disciplinary discharge planning process, led by the attending physician.  Recommendations may be updated based on patient status, additional functional criteria and insurance authorization.    Follow Up Recommendations  Home health OT    Assistance Recommended at Discharge Frequent or constant Supervision/Assistance  Equipment Recommendations  None recommended by OT    Recommendations for Other Services      Precautions / Restrictions Precautions Precautions: Fall Precaution Comments: airborne, droplet precautions Restrictions Weight Bearing Restrictions: No       Mobility Bed Mobility Overal bed mobility: Modified Independent Bed Mobility: Sit to Supine       Sit to supine: Modified independent (Device/Increase time)        Transfers                         Balance Overall balance assessment: History of Falls;Needs assistance Sitting-balance support: Feet supported;No upper extremity supported Sitting balance-Leahy Scale: Good                                      ADL either performed or assessed with clinical judgement   ADL Overall ADL's : Needs assistance/impaired     Grooming: Sitting;Wash/dry hands;Wash/dry face;Set up;Oral care;Applying deodorant                                      Extremity/Trunk Assessment              Vision       Perception     Praxis      Cognition Arousal/Alertness: Awake/alert Behavior During Therapy: WFL for tasks assessed/performed Overall Cognitive Status: No family/caregiver present to determine baseline cognitive functioning                                 General Comments: decreased safety awareness          Exercises     Shoulder Instructions       General Comments      Pertinent Vitals/ Pain       Pain Assessment: No/denies pain  Home Living                                          Prior Functioning/Environment  Frequency  Min 2X/week        Progress Toward Goals  OT Goals(current goals can now be found in the care plan section)  Progress towards OT goals: Progressing toward goals  Acute Rehab OT Goals Patient Stated Goal: to go home OT Goal Formulation: With patient Time For Goal Achievement: 05/22/21 Potential to Achieve Goals: Good  Plan Frequency remains appropriate;Discharge plan needs to be updated    Co-evaluation                 AM-PAC OT "6 Clicks" Daily Activity     Outcome Measure   Help from another person eating meals?: None Help from another person taking care of personal grooming?: None Help from another person toileting, which includes using toliet, bedpan, or urinal?: A Little Help from another person bathing (including washing, rinsing, drying)?: A Little Help from another person to put on and taking off regular upper body clothing?: None Help from another person to put on and taking off regular lower body clothing?: A Little 6 Click Score: 21    End of  Session    OT Visit Diagnosis: Unsteadiness on feet (R26.81);History of falling (Z91.81);Muscle weakness (generalized) (M62.81)   Activity Tolerance Patient tolerated treatment well   Patient Left in bed;with call bell/phone within reach;with bed alarm set   Nurse Communication Mobility status        Time: 2440-1027 OT Time Calculation (min): 10 min  Charges: OT General Charges $OT Visit: 1 Visit OT Treatments $Self Care/Home Management : 8-22 mins  Ardeth Perfect., MPH, MS, OTR/L ascom [phone] 05/13/21, 4:31 PM

## 2021-05-13 NOTE — Progress Notes (Signed)
PROGRESS NOTE  DONTEL Robertson    DOB: 05-01-1941, 80 y.o.  DJM:[id number]  PCP: Peter Orleans, MD   Code Status: Full Code   DOA: 05/05/2021   LOS: 7  Brief Narrative of Current Hospitalization  Peter Robertson is a 80 y.o. male with a PMH significant for HTN, Type 2 DM, CKDIIIa, HFpEF, CAD, dementia. They presented from home to the ED on 05/05/2021 with fall and head injury.  In the ED, it was found that they had acute respiratory distress with COVID and fluid overload. They were treated with oxygen, BiPAP, steroids, breathing treatments, remdesivir.  Patient was admitted to medicine service for further workup and management of acute hypoxic respiratory failure in setting of COVID as outlined in detail below.  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  Completed remdesivir.  Patient able to discharge to SNF once COVID-19 isolation precautions met by SNF.  Patient strongly wishes to go home. -PT/OT to continue evaluation for improvement and treatments to increase strength -Mucinex as needed -Combivent twice daily  Type II DM-hemoglobin A1c 7.1 on admission which is below goal of 8 for age.  Blood sugars well  controlled, were elevated early on admission due to steroid administration. -Changing insulin administration to a.m. dosing for better safety profile  -30 units Semglee a.m. -discontinued sliding scale  CKD IIIb-chronic, stable -Avoid nephrotoxic agents -BMP a.m.  Dementia without behavioral disturbance  depression -Continue home sertraline -Delirium precautions  HLD-chronic, not on home medications  HTN-chronic, moderately well controlled -Continue home metoprolol  Fall at home, initial encounter -PT/OT  Thyroid nodule -ordered US to be done while inpatient- once isolation precautions have been discontinued (11/28)  BPH -Continue home Flomax  DVT prophylaxis: SCDs Start: 05/06/21 0739   Diet:  Diet Orders (From admission, onward)     Start     Ordered   05/06/21 0727  Diet heart healthy/carb modified Room service appropriate? Yes; Fluid consistency: Thin  Diet effective now       Question Answer Comment  Diet-HS Snack? Nothing   Room service appropriate? Yes   Fluid consistency: Thin      05/06/21 0726            Subjective 05/13/21    Pt reports that he would like to have a bath and clean clothes.   Disposition Plan & Communication  Patient status: Inpatient  Admitted From: Home Disposition: Skilled nursing facility Anticipated discharge date: 11/29  Family Communication: None Consults, Procedures, Significant Events  Consultants:  None  Procedures/significant events:  None Antimicrobials:  Anti-infectives (From admission, onward)    Start     Dose/Rate Route Frequency Ordered Stop   05/07/21 1000  remdesivir  100 mg in sodium chloride 0.9 % 100 mL IVPB       See Hyperspace for full Linked Orders Report.   100 mg 200 mL/hr over 30 Minutes Intravenous Daily 05/06/21 0649 05/10/21 1000   05/06/21 0700  remdesivir 200 mg in sodium chloride 0.9% 250 mL IVPB       See Hyperspace for full Linked Orders Report.   200 mg 580 mL/hr over 30 Minutes  Intravenous Once 05/06/21 0649 05/06/21 1009   05/06/21 0515  Ampicillin-Sulbactam (UNASYN) 3 g in sodium chloride 0.9 % 100 mL IVPB        3 g 200 mL/hr over 30 Minutes Intravenous  Once 05/06/21 0512 05/06/21 0603       Objective   Vitals:   05/12/21 1653 05/12/21 2113 05/13/21 0057 05/13/21 0506  BP: 137/68 (!) 144/72 137/72 118/71  Pulse: 70 70 74 77  Resp: _0 Temp: 97.9 F (36.6 C) 97.9 F (36.6 C) 97.9 F (36.6 C) 97.9 F (36.6 C)  TempSrc:  Oral Oral Oral  SpO2: 98% 100% 97% 100%  Weight:      Height:        Intake/Output Summary (Last 24 hours) at 05/13/2021 0703 Last data filed at 05/13/2021 0507 Gross per 24 hour  Intake 960 ml  Output 1700 ml  Net -740 ml    Filed Weights   05/05/21 1528 05/06/21 2245 05/11/21 0511  Weight: 82.6 kg 80.9 kg 85 kg    Patient BMI: Body mass index is 30.25 kg/m.   Physical Exam: General: awake, alert, NAD HEENT: atraumatic, clear conjunctiva, anicteric sclera, moist mucus membranes, hearing grossly normal Respiratory: normal respiratory effort. Cardiovascular: normal S1/S2,  RRR, no JVD, murmurs, rubs, gallops, quick capillary refill  Gastrointestinal: soft, NT, ND, no HSM felt Nervous: A&O x3. no gross focal neurologic deficits, normal speech Extremities: moves all equally, no edema, normal tone Skin: dry, intact, normal temperature, normal color, No rashes, lesions or ulcers Psychiatry: normal mood, congruent affect  Labs   I have personally reviewed following labs and imaging studies CBC    Component Value Date/Time   WBC 6.7 05/08/2021 0436   RBC 4.30 05/08/2021 0436   HGB 12.0 (L) 05/08/2021 0436   HGB 15.1 09/29/2013 0350   HCT 35.9 (L) 05/08/2021 0436   HCT 42.9 09/29/2013 0350   PLT 100 (L) 05/08/2021 0436   PLT 127 (L) 09/29/2013 0350   MCV 83.5 05/08/2021 0436   MCV 83 09/29/2013 0350   MCH 27.9 05/08/2021 0436   MCHC 33.4 05/08/2021 0436   RDW 14.7 05/08/2021 0436   RDW 15.2 (H)  09/29/2013 0350   LYMPHSABS 0.3 (L) 05/07/2021 0631   LYMPHSABS 1.0 09/29/2013 0350   MONOABS 0.5 05/07/2021 0631   MONOABS 0.8 09/29/2013 0350   EOSABS 0.0 05/07/2021 0631   EOSABS 0.2 09/29/2013 0350   BASOSABS 0.0 05/07/2021 0631   BASOSABS 0.1 09/29/2013 0350   Imaging Studies  No results found. Medications   Scheduled Meds:  cholecalciferol  1,000 Units Oral Daily   gabapentin  100 mg Oral QHS   insulin aspart  0-9 Units Subcutaneous TID WC   insulin glargine-yfgn  30 Units Subcutaneous Daily   Ipratropium-Albuterol  2 puff Inhalation BID   metoprolol succinate  50 mg Oral Daily   multivitamin-lutein  1 capsule Oral BID   pantoprazole  40 mg Oral Daily   sertraline  200 mg Oral q AM   tamsulosin  0.8 mg  Oral QHS   vitamin B-12  1,000 mcg Oral Daily   No recently discontinued medications to reconcile  LOS: 7 days   Time spent: >12mn  Keamber Macfadden L Miliyah Luper, DO [facility] 05/13/2021, 7:03 AM   Please refer to amion to contact the [facility] IncAttending or Consulting provider for this pt  [email or web address] Available by Epic secure chat 7AM-7PM. If 7PM-7AM, please contact night-coverage

## 2021-05-14 DIAGNOSIS — W19XXXA Unspecified fall, initial encounter: Secondary | ICD-10-CM | POA: Diagnosis not present

## 2021-05-14 DIAGNOSIS — N400 Enlarged prostate without lower urinary tract symptoms: Secondary | ICD-10-CM | POA: Diagnosis not present

## 2021-05-14 DIAGNOSIS — J9601 Acute respiratory failure with hypoxia: Secondary | ICD-10-CM | POA: Diagnosis not present

## 2021-05-14 DIAGNOSIS — U071 COVID-19: Secondary | ICD-10-CM | POA: Diagnosis not present

## 2021-05-14 LAB — GLUCOSE, CAPILLARY
Glucose-Capillary: 146 mg/dL — ABNORMAL HIGH (ref 70–99)
Glucose-Capillary: 209 mg/dL — ABNORMAL HIGH (ref 70–99)
Glucose-Capillary: 263 mg/dL — ABNORMAL HIGH (ref 70–99)
Glucose-Capillary: 252 mg/dL — ABNORMAL HIGH (ref 70–99)

## 2021-05-14 MED ORDER — ATORVASTATIN CALCIUM 20 MG PO TABS
40.0000 mg | ORAL_TABLET | Freq: Every day | ORAL | Status: DC
  Administered 2021-05-14 – 2021-05-22 (×9): 40 mg via ORAL
  Filled 2021-05-14 (×9): qty 2

## 2021-05-14 NOTE — Progress Notes (Signed)
PROGRESS NOTE  Peter Robertson    DOB: 1941-03-03, 80 y.o.  FSE:[id number]  PCP: Danae Orleans, MD   Code Status: Full Code   DOA: 05/05/2021   LOS: 8  Brief Narrative of Current Hospitalization  Peter Robertson is a 80 y.o. male with a PMH significant for HTN, Type 2 DM, CKDIIIa, HFpEF, CAD, dementia. They presented from home to the ED on 05/05/2021 with fall and head injury.  In the ED, it was found that they had acute respiratory distress with COVID and fluid overload. They were treated with oxygen, BiPAP, steroids, breathing treatments, remdesivir.  Patient was admitted to medicine service for further workup and management of acute hypoxic respiratory failure in setting of COVID as outlined in detail below.  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  Completed remdesivir.  Patient able to discharge to SNF once COVID-19 isolation precautions met by SNF.  Patient strongly wishes to go home. -PT/OT to continue evaluation for improvement and treatments to increase strength -Mucinex as needed -Combivent twice daily  Type II DM-hemoglobin A1c 7.1 on admission which is below goal of 8 for age.  Blood sugars well  controlled, were elevated early on admission due to steroid administration. -Changing insulin administration to a.m. dosing for better safety profile  -30 units Semglee a.m. -discontinued sliding scale  CKD IIIb-chronic, stable -Avoid nephrotoxic agents -BMP a.m.  Dementia without behavioral disturbance  depression -Continue home sertraline -Delirium precautions  HLD-chronic, not on home medications  HTN-chronic, moderately well controlled -Continue home metoprolol  Fall at home, initial encounter -PT/OT  Thyroid nodule -ordered US to be done while inpatient- once isolation precautions have been discontinued (11/28)  BPH -Continue home Flomax  DVT prophylaxis: enoxaparin (LOVENOX) injection 30 mg Start: 05/13/21 2200 SCDs Start: 05/06/21 0739   Diet:  Diet Orders (From admission, onward)     Start     Ordered   05/06/21 0727  Diet heart healthy/carb modified Room service appropriate? Yes; Fluid consistency: Thin  Diet effective now       Question Answer Comment  Diet-HS Snack? Nothing   Room service appropriate? Yes   Fluid consistency: Thin      05/06/21 0726            Subjective 05/14/21    Pt reports that he is doing well overall. States there is nothing good on TV.   Disposition Plan & Communication  Patient status: Inpatient  Admitted From: Home Disposition: Skilled nursing facility Anticipated discharge date: 11/29  Family Communication: None Consults, Procedures, Significant Events  Consultants:  None  Procedures/significant events:  None Antimicrobials:  Anti-infectives (From admission, onward)    Start     Dose/Rate  Route Frequency Ordered Stop   05/07/21 1000  remdesivir 100 mg in sodium chloride 0.9 % 100 mL IVPB       See Hyperspace for full Linked Orders Report.   100 mg 200 mL/hr over 30 Minutes Intravenous Daily 05/06/21 0649 05/10/21 1000   05/06/21 0700  remdesivir 200 mg in sodium chloride 0.9% 250 mL IVPB       See  Hyperspace for full Linked Orders Report.   200 mg 580 mL/hr over 30 Minutes Intravenous Once 05/06/21 0649 05/06/21 1009   05/06/21 0515  Ampicillin-Sulbactam (UNASYN) 3 g in sodium chloride 0.9 % 100 mL IVPB        3 g 200 mL/hr over 30 Minutes Intravenous  Once 05/06/21 0512 05/06/21 0603       Objective   Vitals:   05/13/21 1603 05/13/21 1930 05/13/21 2248 05/14/21 0400  BP: 131/61 121/88 116/87 138/70  Pulse: 78 76 70 72  Resp: 18 16 17 18   Temp: 98.2 F (36.8 C) 98.5 F (36.9 C) 97.7 F (36.5 C) 97.6 F (36.4 C)  TempSrc:  Oral Oral Oral  SpO2: 100% 98% 100% 97%  Weight:      Height:        Intake/Output Summary (Last 24 hours) at 05/14/2021 0714 Last data filed at 05/14/2021 0600 Gross per 24 hour  Intake 720 ml  Output 1075 ml  Net -355 ml    Filed Weights   05/05/21 1528 05/06/21 2245 05/11/21 0511  Weight: 82.6 kg 80.9 kg 85 kg    Patient BMI: Body mass index is 30.25 kg/m.   Physical Exam: General: awake, alert, NAD, sitting in chair HEENT: atraumatic, clear conjunctiva, anicteric sclera, moist mucus membranes, hearing grossly normal Respiratory: normal respiratory effort. Cardiovascular: normal S1/S2,  RRR, no JVD, murmurs, rubs, gallops, quick capillary refill  Gastrointestinal: soft, NT, ND, no HSM felt Nervous: A&O x3. no gross focal neurologic deficits, normal speech Extremities: moves all equally, no edema, normal tone Skin: dry, intact, normal temperature, normal color, No rashes, lesions or ulcers Psychiatry: normal mood, congruent affect  Labs   I have personally reviewed following labs and imaging studies CBC    Component Value Date/Time   WBC 6.7 05/08/2021 0436   RBC 4.30 05/08/2021 0436   HGB 12.0 (L) 05/08/2021 0436   HGB 15.1 09/29/2013 0350   HCT 35.9 (L) 05/08/2021 0436   HCT 42.9 09/29/2013 0350   PLT 100 (L) 05/08/2021 0436   PLT 127 (L) 09/29/2013 0350   MCV 83.5 05/08/2021 0436   MCV 83 09/29/2013 0350   MCH 27.9  05/08/2021 0436   MCHC 33.4 05/08/2021 0436   RDW 14.7 05/08/2021 0436   RDW 15.2 (H) 09/29/2013 0350   LYMPHSABS 0.3 (L) 05/07/2021 0631   LYMPHSABS 1.0 09/29/2013 0350   MONOABS 0.5 05/07/2021 0631   MONOABS 0.8 09/29/2013 0350   EOSABS 0.0 05/07/2021 0631   EOSABS 0.2 09/29/2013 0350   BASOSABS 0.0 05/07/2021 0631   BASOSABS 0.1 09/29/2013 0350   Imaging Studies  No results found. Medications   Scheduled Meds:  cholecalciferol  1,000 Units Oral Daily   enoxaparin (LOVENOX) injection  30 mg Subcutaneous Q24H   gabapentin  100 mg Oral QHS   insulin glargine-yfgn  30 Units Subcutaneous Daily   Ipratropium-Albuterol  2 puff Inhalation BID   metoprolol succinate  50 mg Oral Daily   multivitamin-lutein  1 capsule Oral BID   pantoprazole  40 mg Oral Daily   sertraline  200 mg Oral q AM   tamsulosin  0.8 mg Oral QHS   vitamin B-12  1,000 mcg Oral Daily   No recently discontinued medications to reconcile  LOS: 8 days   Time spent: >58mn  Ranjit Ashurst L Rohnan Bartleson, DO [facility] 05/14/2021, 7:14 AM   Please refer to amion to contact the TC[facility] ClinicAttending or Consulting provider for this pt  [email or web address] Available by Epic secure chat 7AM-7PM. If 7PM-7AM, please contact night-coverage

## 2021-05-15 LAB — GLUCOSE, CAPILLARY
Glucose-Capillary: 133 mg/dL — ABNORMAL HIGH (ref 70–99)
Glucose-Capillary: 144 mg/dL — ABNORMAL HIGH (ref 70–99)
Glucose-Capillary: 164 mg/dL — ABNORMAL HIGH (ref 70–99)
Glucose-Capillary: 200 mg/dL — ABNORMAL HIGH (ref 70–99)

## 2021-05-15 NOTE — Progress Notes (Signed)
I spoke with patient Peter Robertson, [place], she wanted to speak with Case Management concerning discharge plan to rehab/SNF. Her ultimate goal is assisted living but patient is refusing.

## 2021-05-15 NOTE — Progress Notes (Signed)
PROGRESS NOTE  Peter Robertson    DOB: [birth year], 80 y.o.  LJQ:[id number]  PCP: Danae Orleans, MD   Code Status: Full Code   DOA: 05/05/2021   LOS: 9  Brief Narrative of Current Hospitalization  Peter Robertson is a 80 y.o. male with a PMH significant for HTN, Type 2 DM, CKDIIIa, HFpEF, CAD, dementia. They presented from home to the ED on 05/05/2021 with fall and head injury.  In the ED, it was found that they had acute respiratory distress with COVID and fluid overload. They were treated with oxygen, BiPAP, steroids, breathing treatments, remdesivir.  Patient was admitted to medicine service for further workup and management of acute hypoxic respiratory failure in setting of COVID as outlined in detail below.  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  Completed remdesivir.  Patient able to discharge to SNF once COVID-19 isolation precautions met by SNF.  Patient strongly wishes to go home. -PT/OT to continue evaluation for improvement and treatments to increase strength -Mucinex as needed -Combivent twice daily  Type II DM-hemoglobin A1c 7.1 on admission which is below goal of 8 for age.  Blood sugars well  controlled, were elevated early on admission due to steroid administration. -Changing insulin administration to a.m. dosing for better safety profile  -30 units Semglee a.m. -discontinued sliding scale  CKD IIIb-chronic, stable -Avoid nephrotoxic agents -BMP a.m.  Dementia without behavioral disturbance  depression -Continue home sertraline -Delirium precautions  HLD-chronic, not on home medications  HTN-chronic, moderately well controlled -Continue home metoprolol  Fall at home, initial encounter -PT/OT  Thyroid nodule -ordered US to be done while inpatient- once isolation precautions have been discontinued (11/28)  BPH -Continue home Flomax  DVT prophylaxis: enoxaparin (LOVENOX) injection 30 mg Start: 05/13/21 2200 SCDs Start: 05/06/21 0739   Diet:  Diet Orders (From admission, onward)     Start     Ordered   05/06/21 0727  Diet heart healthy/carb modified Room service appropriate? Yes; Fluid consistency: Thin  Diet effective now       Question Answer Comment  Diet-HS Snack? Nothing   Room service appropriate? Yes   Fluid consistency: Thin      05/06/21 0726            Subjective 05/15/21    Pt reports no complaints today. Doing well.  Disposition Plan & Communication  Patient status: Inpatient  Admitted From: Home Disposition: Skilled nursing facility Anticipated discharge date: 11/29  Family Communication: None Consults, Procedures, Significant Events  Consultants:  None  Procedures/significant events:  None Antimicrobials:  Anti-infectives (From admission, onward)    Start     Dose/Rate Route Frequency Ordered Stop   05/07/21 1000  remdesivir 100 mg in sodium chloride 0.9 % 100 mL IVPB       See Hyperspace for full Linked Orders Report.   100 mg 200 mL/hr over 30 Minutes Intravenous Daily 05/06/21 0649 05/10/21 1000   05/06/21 0700  remdesivir 200 mg in sodium chloride 0.9% 250 mL IVPB       See Hyperspace for full Linked Orders Report.    200 mg 580 mL/hr over 30 Minutes Intravenous Once 05/06/21 0649 05/06/21 1009   05/06/21 0515  Ampicillin-Sulbactam (UNASYN) 3 g in sodium chloride 0.9 % 100 mL IVPB        3 g 200 mL/hr over 30 Minutes Intravenous  Once 05/06/21 0512 05/06/21 0603       Objective   Vitals:   05/14/21 1624 05/14/21 1859 05/14/21 2328 05/15/21 0405  BP: 136/68 136/85 111/69 136/70  Pulse: 63 66 70 74  Resp: 18 16 17 15   Temp: 98.2 F (36.8 C) 98.1 F (36.7 C) 98.3 F (36.8 C) 98 F (36.7 C)  TempSrc:   Oral Oral  SpO2: 98% 93% 97% 95%  Weight:      Height:        Intake/Output Summary (Last 24 hours) at 05/15/2021 0650 Last data filed at 05/15/2021 0600 Gross per 24 hour  Intake 1700 ml  Output 1450 ml  Net 250 ml    Filed Weights   05/05/21 1528 05/06/21 2245 05/11/21 0511  Weight: 82.6 kg 80.9 kg 85 kg    Patient BMI: Body mass index is 30.25 kg/m.   Physical Exam: General: awake, alert, NAD, sitting in chair HEENT: atraumatic, clear conjunctiva, anicteric sclera, moist mucus membranes, hearing grossly normal Respiratory: normal respiratory effort. Cardiovascular: normal S1/S2,  RRR, no JVD, murmurs, rubs, gallops, quick capillary refill  Gastrointestinal: soft, NT, ND, no HSM felt Nervous: A&O x3. no gross focal neurologic deficits, normal speech Extremities: moves all equally, no edema, normal tone Skin: dry, intact, normal temperature, normal color, No rashes, lesions or ulcers Psychiatry: normal mood, congruent affect  Labs   I have personally reviewed following labs and imaging studies CBC    Component Value Date/Time   WBC 6.7 05/08/2021 0436   RBC 4.30 05/08/2021 0436   HGB 12.0 (L) 05/08/2021 0436   HGB 15.1 09/29/2013 0350   HCT 35.9 (L) 05/08/2021 0436   HCT 42.9 09/29/2013 0350   PLT 100 (L) 05/08/2021 0436   PLT 127 (L) 09/29/2013 0350   MCV 83.5 05/08/2021 0436   MCV 83 09/29/2013 0350   MCH 27.9 05/08/2021 0436   MCHC 33.4 05/08/2021 0436   RDW  14.7 05/08/2021 0436   RDW 15.2 (H) 09/29/2013 0350   LYMPHSABS 0.3 (L) 05/07/2021 0631   LYMPHSABS 1.0 09/29/2013 0350   MONOABS 0.5 05/07/2021 0631   MONOABS 0.8 09/29/2013 0350   EOSABS 0.0 05/07/2021 0631   EOSABS 0.2 09/29/2013 0350   BASOSABS 0.0 05/07/2021 0631   BASOSABS 0.1 09/29/2013 0350   Imaging Studies  No results found. Medications   Scheduled Meds:  atorvastatin  40 mg Oral QAC supper   cholecalciferol  1,000 Units Oral Daily   enoxaparin (LOVENOX) injection  30 mg Subcutaneous Q24H   gabapentin  100 mg Oral QHS   insulin glargine-yfgn  30 Units Subcutaneous Daily   Ipratropium-Albuterol  2 puff Inhalation BID   metoprolol succinate  50 mg Oral Daily   multivitamin-lutein  1 capsule Oral BID   pantoprazole  40 mg Oral Daily   sertraline  200 mg Oral q AM   tamsulosin  0.8 mg Oral QHS   vitamin B-12  1,000 mcg Oral Daily   No recently discontinued medications to reconcile  LOS: 9 days   Time spent: >71mn  Johanna Stafford L Randie Tallarico, DO [facility] 05/15/2021, 6:50 AM   Please refer to amion to contact the [facility]ending or Consulting provider for this pt  [email or web address] Available by Epic secure chat 7AM-7PM. If 7PM-7AM, please contact night-coverage

## 2021-05-16 ENCOUNTER — Inpatient Hospital Stay: Payer: Medicare Other

## 2021-05-16 DIAGNOSIS — J1282 Pneumonia due to coronavirus disease 2019: Secondary | ICD-10-CM

## 2021-05-16 DIAGNOSIS — J9601 Acute respiratory failure with hypoxia: Secondary | ICD-10-CM | POA: Diagnosis not present

## 2021-05-16 DIAGNOSIS — W19XXXA Unspecified fall, initial encounter: Secondary | ICD-10-CM | POA: Diagnosis not present

## 2021-05-16 DIAGNOSIS — N400 Enlarged prostate without lower urinary tract symptoms: Secondary | ICD-10-CM | POA: Diagnosis not present

## 2021-05-16 DIAGNOSIS — U071 COVID-19: Secondary | ICD-10-CM | POA: Diagnosis not present

## 2021-05-16 LAB — GLUCOSE, CAPILLARY
Glucose-Capillary: 145 mg/dL — ABNORMAL HIGH (ref 70–99)
Glucose-Capillary: 185 mg/dL — ABNORMAL HIGH (ref 70–99)
Glucose-Capillary: 126 mg/dL — ABNORMAL HIGH (ref 70–99)
Glucose-Capillary: 121 mg/dL — ABNORMAL HIGH (ref 70–99)

## 2021-05-16 NOTE — Care Management Important Message (Signed)
Important Message  Patient Details  Name: Peter Robertson MRN: [id number] Date of Birth: 1940-10-10   Medicare Important Message Given:  Yes     Juliann Pulse A Reianna Batdorf 05/16/2021, 12:09 PM

## 2021-05-16 NOTE — NC FL2 (Signed)
[place] LEVEL OF CARE SCREENING TOOL     IDENTIFICATION  Patient Name: Peter Robertson Birthdate: 12/12/1940 Sex: male Admission Date (Current Location): 05/05/2021  [place] and [place] Number:  Engineering geologist and Address:  [facility], [address] [place], [place] [address]      Provider Number: [id number]  Attending Physician Name and Address:  Richarda Osmond, MD  Relative Name and Phone Number:  Lorinda Creed [phone]    Current Level of Care: Hospital Recommended Level of Care: [place] Prior Approval Number:    Date Approved/Denied:   PASRR Number: [id number]  Discharge Plan: SNF    Current Diagnoses: Patient Active Problem List   Diagnosis Date Noted   Thyroid nodule greater than or equal to 1 cm in diameter incidentally noted on imaging study    Head injury    Weakness generalized    Acute on chronic diastolic CHF (congestive heart failure) ([place]) 05/07/2021   Acute respiratory failure with hypoxia ([place]) 05/07/2021   Aspiration pneumonia ([place]) 05/06/2021   Pneumonia due to COVID-19 virus 05/06/2021   Senile dementia without behavioral disturbance ([place]) 05/06/2021   Fall at home, initial encounter 05/06/2021   Hyperlipidemia    Hypertension    Elevated troponin    Chronic diastolic CHF (congestive heart failure) ([place])    Depression    CKD (chronic kidney disease), stage IIIa    Thyroid nodule    Fall    Encounter for psychological evaluation 02/15/2020   AKI (acute kidney injury) ([place]) 02/12/2020   Vertigo 10/11/2019   BPPV (benign paroxysmal positional vertigo) 10/10/2019   CAD (coronary artery disease) 10/10/2019   Type II diabetes mellitus with renal manifestations ([place]) 10/10/2019   BPH (benign prostatic hyperplasia) 10/10/2019   Thrombocytopenia ([place]) 10/10/2019   Dark stools 10/10/2019   Unstable angina (HCC) 05/20/2018   Chest pain 01/30/2017    Orientation RESPIRATION  BLADDER Height & Weight     Self, Time, Situation, Place  Normal Incontinent Weight: 85 kg Height:  5\' 6"  (167.6 cm)  BEHAVIORAL SYMPTOMS/MOOD NEUROLOGICAL BOWEL NUTRITION STATUS      Continent Diet (see discharge summery)  AMBULATORY STATUS COMMUNICATION OF NEEDS Skin   Limited Assist Verbally Normal                       Personal Care Assistance Level of Assistance  Bathing, Feeding, Dressing Bathing Assistance: Limited assistance Feeding assistance: Limited assistance Dressing Assistance: Limited assistance     Functional Limitations Info  Sight, Hearing, Speech Sight Info: Impaired Hearing Info: Impaired Speech Info: Adequate    SPECIAL CARE FACTORS FREQUENCY  PT (By licensed PT), OT (By licensed OT)     PT Frequency: 5 times per week OT Frequency: 5 times per week            Contractures Contractures Info: Not present    Additional Factors Info  Code Status, Allergies, Isolation Precautions Code Status Info: Full Allergies Info: Sulfa and tape     Isolation Precautions Info: airborne     Current Medications (05/16/2021):  This is the current hospital active medication list Current Facility-Administered Medications  Medication Dose Route Frequency Provider Last Rate Last Admin   acetaminophen (TYLENOL) tablet 650 mg  650 mg Oral Q6H PRN Ivor Costa, MD   650 mg at 05/15/21 2158   atorvastatin (LIPITOR) tablet 40 mg  40 mg Oral QAC supper Richarda Osmond, MD   40  mg at 05/15/21 1712   cholecalciferol (VITAMIN D3) tablet 1,000 Units  1,000 Units Oral Daily Ivor Costa, MD   1,000 Units at 05/15/21 0818   dextromethorphan-guaiFENesin ([place] DM) 30-600 MG per 12 hr tablet 1 tablet  1 tablet Oral BID PRN Ivor Costa, MD   1 tablet at 05/15/21 0818   enoxaparin (LOVENOX) injection 30 mg  30 mg Subcutaneous Q24H Doristine Mango L, MD   30 mg at 05/15/21 2200   gabapentin (NEURONTIN) capsule 100 mg  100 mg Oral QHS Ivor Costa, MD   100 mg at 05/15/21 2159    insulin glargine-yfgn (SEMGLEE) injection 30 Units  30 Units Subcutaneous Daily Richarda Osmond, MD   30 Units at 05/15/21 7867   Ipratropium-Albuterol (COMBIVENT) respimat 2 puff  2 puff Inhalation BID Richarda Osmond, MD   2 puff at 05/15/21 2201   melatonin tablet 10 mg  10 mg Oral QHS PRN Ivor Costa, MD   10 mg at 05/15/21 2200   metoprolol succinate (TOPROL-XL) 24 hr tablet 50 mg  50 mg Oral Daily Ivor Costa, MD   50 mg at 05/15/21 0818   multivitamin-lutein (OCUVITE-LUTEIN) capsule 1 capsule  1 capsule Oral BID Ivor Costa, MD   1 capsule at 05/15/21 2200   ondansetron (ZOFRAN) injection 4 mg  4 mg Intravenous Q8H PRN Ivor Costa, MD   4 mg at 05/06/21 2209   pantoprazole (PROTONIX) EC tablet 40 mg  40 mg Oral Daily Ivor Costa, MD   40 mg at 05/15/21 0818   sertraline (ZOLOFT) tablet 200 mg  200 mg Oral q AM Ivor Costa, MD   200 mg at 05/15/21 0818   tamsulosin (FLOMAX) capsule 0.8 mg  0.8 mg Oral QHS Ivor Costa, MD   0.8 mg at 05/15/21 2159   vitamin B-12 (CYANOCOBALAMIN) tablet 1,000 mcg  1,000 mcg Oral Daily Ivor Costa, MD   1,000 mcg at 05/15/21 6720     Discharge Medications: Please see discharge summary for a list of discharge medications.  Relevant Imaging Results:  Relevant Lab Results:   Additional Information SS# [id number]  Shelbie Hutching, RN

## 2021-05-16 NOTE — TOC Progression Note (Signed)
Transition of Care [facility]) - Progression Note    Patient Details  Name: TEAGHAN FORMICA MRN: [id number] Date of Birth: 18-Apr-1941  [facility]) CM/SW Contact  Shelbie Hutching, RN Phone Number: 05/16/2021, 3:56 PM  Clinical Narrative:     [facility] chooses [facility] for [facility]F.  [facility] can accept tomorrow.  RNCM has started insurance auth.    Expected Discharge Plan: [place] Barriers to Discharge: Continued Medical Work up  Expected Discharge Plan and Services Expected Discharge Plan: [place] Choice: [place] arrangements for the past 2 months: Single Family Home                                       Social Determinants of Health (SDOH) Interventions    Readmission Risk Interventions No flowsheet data found.

## 2021-05-16 NOTE — Progress Notes (Signed)
Physical Therapy Treatment Patient Details Name: Peter Robertson MRN: [id number] DOB: November 28, 1940 Today's Date: 05/16/2021   History of Present Illness Pt admitted for ARF secondary to covid with complaints of falls and SOB symptoms. History includes HTN, HLD, DM, GERD, and MI.    PT Comments    Pt nauseous on first attempt but agrees on second.  He is able to get to EOB with supervision.  Steady in sitting.  He needs cues for hand placements to stand.  Stands with min guard x 1.  Initially for 100' gait is very slow with short shuffling steps.  He has a post lean and needs tactile cues and occasional light min assist to prevent post LOB.  Given time and distance, gait slowly improves but decrease step length and height and decreased speed. 5 x sit to stand with emphasis on hand placements and overall balance.     Pt is progressing well towards goal but balance remains impaired and is at high risk for post LOB and fall without +1 assist.  He has poor safety awareness and when asked, stated he does not think he will have difficulty managing at home but given gait speed, decreased step height and length and overall balance deficits, independent mobility at home is precarious.  He has a neighbor that looks in on his day per pt but who works and is not available at all times and per [facility] caregivers are now Covid + and unable to help.  Given barriers to discharge, will change recommendations to SNF to allow for improved strength, mobility and balance to improve chances of a successful discharge.   Recommendations for follow up therapy are one component of a multi-disciplinary discharge planning process, led by the attending physician.  Recommendations may be updated based on patient status, additional functional criteria and insurance authorization.  Follow Up Recommendations  Skilled nursing-short term rehab (<3 hours/day)     Assistance Recommended at Discharge    Equipment Recommendations  Rolling  walker (2 wheels)    Recommendations for Other Services       Precautions / Restrictions Precautions Precautions: Fall Precaution Comments: no longer on covid precautions, 10 days up Restrictions Weight Bearing Restrictions: No     Mobility  Bed Mobility Overal bed mobility: Modified Independent Bed Mobility: Sit to Supine;Supine to Sit     Supine to sit: Modified independent (Device/Increase time) Sit to supine: Modified independent (Device/Increase time)        Transfers Overall transfer level: Needs assistance Equipment used: Rolling walker (2 wheels) Transfers: Sit to/from Stand Sit to Stand: Supervision;Min guard           General transfer comment: cues for hand placements, general safety and controlled sit to chair    Ambulation/Gait Ambulation/Gait assistance: Min assist Gait Distance (Feet): 200 Feet Assistive device: Rolling walker (2 wheels) Gait Pattern/deviations: Step-through pattern;Decreased step length - right;Decreased step length - left;Drifts right/left Gait velocity: decreased     General Gait Details: very slow gait with short shuffling steps, speed does increase with time but remains decreased.  some post lean/lob that requires min a x 1/tactile cues to correct.   Stairs             Wheelchair Mobility    Modified Rankin (Stroke Patients Only)       Balance Overall balance assessment: History of Falls;Needs assistance Sitting-balance support: Feet supported;No upper extremity supported Sitting balance-Leahy Scale: Good     Standing balance support: Bilateral upper extremity supported;During  functional activity Standing balance-Leahy Scale: Fair Standing balance comment: some post lean during mobility that increases risk for post LOB                            Cognition Arousal/Alertness: Awake/alert Behavior During Therapy: WFL for tasks assessed/performed Overall Cognitive Status: Within Functional Limits  for tasks assessed                                 General Comments: decreased safety awareness        Exercises Other Exercises Other Exercises: sit to stand x 5 with emphasis on hand placements and safety. with 1 step forwarrd and back to challenge balance and ensure no chair support. Other Exercises: Seated sink bath w/ grooming and seated dressing tasks completed with remote supervision after set up. Up to bathroom with supervision + RW.    General Comments        Pertinent Vitals/Pain Pain Assessment: Faces Pain Score: 10-Worst pain ever Faces Pain Scale: Hurts little more Pain Location: L knee stiffness Pain Descriptors / Indicators: Tightness Pain Intervention(s): Limited activity within patient's tolerance;Monitored during session;Repositioned    Home Living                          Prior Function            PT Goals (current goals can now be found in the care plan section) Progress towards PT goals: Progressing toward goals    Frequency    Min 2X/week      PT Plan Discharge plan needs to be updated    Co-evaluation              AM-PAC PT "6 Clicks" Mobility   Outcome Measure  Help needed turning from your back to your side while in a flat bed without using bedrails?: A Little Help needed moving from lying on your back to sitting on the side of a flat bed without using bedrails?: A Little Help needed moving to and from a bed to a chair (including a wheelchair)?: A Little Help needed standing up from a chair using your arms (e.g., wheelchair or bedside chair)?: A Little Help needed to walk in hospital room?: A Little Help needed climbing 3-5 steps with a railing? : A Lot 6 Click Score: 17    End of Session Equipment Utilized During Treatment: Gait belt Activity Tolerance: Patient tolerated treatment well Patient left: in chair;with chair alarm set;with call bell/phone within reach Nurse Communication: Mobility  status PT Visit Diagnosis: Muscle weakness (generalized) (M62.81);Difficulty in walking, not elsewhere classified (R26.2)     Time: 8115-7262 PT Time Calculation (min) (ACUTE ONLY): 26 min  Charges:  $Gait Training: 8-22 mins $Therapeutic Activity: 8-22 mins                    Chesley Noon, PTA 05/16/21, 3:58 PM

## 2021-05-16 NOTE — TOC Progression Note (Signed)
Transition of Care [facility]) - Progression Note    Patient Details  Name: Peter Robertson MRN: [id number] Date of Birth: Feb 10, 1941  [facility]) CM/SW Contact  Shelbie Hutching, RN Phone Number: 05/16/2021, 9:42 AM  Clinical Narrative:    Patient will be ready for discharge tomorrow, today is last day of quarantine.  Bed search started.     Expected Discharge Plan: [place] Barriers to Discharge: Continued Medical Work up  Expected Discharge Plan and Services Expected Discharge Plan: [place] Choice: [place] arrangements for the past 2 months: Single Family Home                                       Social Determinants of Health (SDOH) Interventions    Readmission Risk Interventions No flowsheet data found.

## 2021-05-16 NOTE — Progress Notes (Signed)
PROGRESS NOTE  Peter Robertson    DOB: 05-31-1941, 80 y.o.  FHL:[id number]  PCP: Danae Orleans, MD   Code Status: Full Code   DOA: 05/05/2021   LOS: 10  Brief Narrative of Current Hospitalization  Peter Robertson is a 80 y.o. male with a PMH significant for HTN, Type 2 DM, CKDIIIa, HFpEF, CAD, dementia. They presented from home to the ED on 05/05/2021 with fall and head injury.  In the ED, it was found that they had acute respiratory distress with COVID and fluid overload. They were treated with oxygen, BiPAP, steroids, breathing treatments, remdesivir.  Patient was admitted to medicine service for further workup and management of acute hypoxic respiratory failure in setting of COVID as outlined in detail below.  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  Completed remdesivir.  Patient able to discharge to SNF once COVID-19 isolation precautions met by SNF.  Patient strongly wishes to go home. Off airborn precautions today. Can dc tomorrow.  -PT/OT to continue evaluation for improvement and treatments to increase strength -Mucinex as needed -Combivent twice daily  Type II DM-hemoglobin A1c 7.1 on admission which  is below goal of 8 for age.  Blood sugars well controlled, were elevated early on admission due to steroid administration. -Changing insulin administration to a.m. dosing for better safety profile  -30 units Semglee a.m. -discontinued sliding scale  CKD IIIb-chronic, stable -Avoid nephrotoxic agents -BMP a.m.  Dementia without behavioral disturbance  depression -Continue home sertraline -Delirium precautions  HLD-chronic, not on home medications  HTN-chronic, moderately well controlled -Continue home metoprolol  Fall at home, initial encounter -PT/OT  Thyroid nodule -ordered US to be done while inpatient- once isolation precautions have been discontinued (11/28)  BPH -Continue home Flomax  DVT prophylaxis: enoxaparin (LOVENOX) injection 30 mg Start: 05/13/21 2200 SCDs Start: 05/06/21 0739   Diet:  Diet Orders (From admission, onward)     Start     Ordered   05/06/21 0727  Diet heart healthy/carb modified Room service appropriate? Yes; Fluid consistency: Thin  Diet effective now       Question Answer Comment  Diet-HS Snack? Nothing   Room service appropriate? Yes   Fluid consistency: Thin      05/06/21 0726            Subjective 05/16/21    Pt reports doing well today. No complaints other than cold coffee.  Disposition Plan & Communication  Patient status: Inpatient  Admitted From: Home Disposition: Skilled nursing facility Anticipated discharge date: 11/29  Family Communication: None Consults, Procedures, Significant Events  Consultants:  None  Procedures/significant events:  Thyroid US 11/28  Antimicrobials:  Anti-infectives (From admission, onward)  Start     Dose/Rate Route Frequency Ordered Stop   05/07/21 1000  remdesivir 100 mg in sodium chloride 0.9 % 100 mL IVPB       See Hyperspace for full Linked Orders Report.   100 mg 200 mL/hr over 30 Minutes Intravenous Daily 05/06/21 0649 05/10/21 1000   05/06/21 0700  remdesivir 200 mg in  sodium chloride 0.9% 250 mL IVPB       See Hyperspace for full Linked Orders Report.   200 mg 580 mL/hr over 30 Minutes Intravenous Once 05/06/21 0649 05/06/21 1009   05/06/21 0515  Ampicillin-Sulbactam (UNASYN) 3 g in sodium chloride 0.9 % 100 mL IVPB        3 g 200 mL/hr over 30 Minutes Intravenous  Once 05/06/21 0512 05/06/21 0603       Objective   Vitals:   05/15/21 1701 05/15/21 2118 05/16/21 0115 05/16/21 0458  BP: 121/65 (!) 154/79 118/66 128/65  Pulse: 62 77 81 89  Resp: 18 20 18 19   Temp: 98.1 F (36.7 C) 98.2 F (36.8 C) (!) 97.5 F (36.4 C) 98.1 F (36.7 C)  TempSrc: Oral Oral Oral Oral  SpO2: 99% 96% 94% 92%  Weight:      Height:        Intake/Output Summary (Last 24 hours) at 05/16/2021 0646 Last data filed at 05/15/2021 2219 Gross per 24 hour  Intake 240 ml  Output 1150 ml  Net -910 ml    Filed Weights   05/05/21 1528 05/06/21 2245 05/11/21 0511  Weight: 82.6 kg 80.9 kg 85 kg    Patient BMI: Body mass index is 30.25 kg/m.   Physical Exam: General: awake, alert, NAD, sitting in chair HEENT: atraumatic, clear conjunctiva, anicteric sclera, moist mucus membranes, hearing grossly normal Respiratory: normal respiratory effort. Cardiovascular: normal S1/S2,  RRR, no JVD, murmurs, rubs, gallops, quick capillary refill  Gastrointestinal: soft, NT, ND, no HSM felt Nervous: A&O x3. no gross focal neurologic deficits, normal speech Extremities: moves all equally, no edema, normal tone Skin: dry, intact, normal temperature, normal color, No rashes, lesions or ulcers Psychiatry: normal mood, congruent affect  Labs   I have personally reviewed following labs and imaging studies CBC    Component Value Date/Time   WBC 6.7 05/08/2021 0436   RBC 4.30 05/08/2021 0436   HGB 12.0 (L) 05/08/2021 0436   HGB 15.1 09/29/2013 0350   HCT 35.9 (L) 05/08/2021 0436   HCT 42.9 09/29/2013 0350   PLT 100 (L) 05/08/2021 0436   PLT 127 (L) 09/29/2013 0350   MCV 83.5  05/08/2021 0436   MCV 83 09/29/2013 0350   MCH 27.9 05/08/2021 0436   MCHC 33.4 05/08/2021 0436   RDW 14.7 05/08/2021 0436   RDW 15.2 (H) 09/29/2013 0350   LYMPHSABS 0.3 (L) 05/07/2021 0631   LYMPHSABS 1.0 09/29/2013 0350   MONOABS 0.5 05/07/2021 0631   MONOABS 0.8 09/29/2013 0350   EOSABS 0.0 05/07/2021 0631   EOSABS 0.2 09/29/2013 0350   BASOSABS 0.0 05/07/2021 0631   BASOSABS 0.1 09/29/2013 0350   Imaging Studies  No results found. Medications   Scheduled Meds:  atorvastatin  40 mg Oral QAC supper   cholecalciferol  1,000 Units Oral Daily   enoxaparin (LOVENOX) injection  30 mg Subcutaneous Q24H   gabapentin  100 mg Oral QHS   insulin glargine-yfgn  30 Units Subcutaneous Daily   Ipratropium-Albuterol  2 puff Inhalation BID   metoprolol succinate  50 mg Oral Daily  multivitamin-lutein  1 capsule Oral BID   pantoprazole  40 mg Oral Daily   sertraline  200 mg Oral q AM   tamsulosin  0.8 mg Oral QHS   vitamin B-12  1,000 mcg Oral Daily   No recently discontinued medications to reconcile  LOS: 10 days   Time spent: >49mn  Yuan Gann L Khoa Opdahl, DO [facility] 05/16/2021, 6:46 AM   Please refer to amion to contact the [facility] or Consulting provider for this pt  [email or web address] Available by Epic secure chat 7AM-7PM. If 7PM-7AM, please contact night-coverage

## 2021-05-16 NOTE — Progress Notes (Signed)
Occupational Therapy Treatment Patient Details Name: Peter Robertson MRN: [id number] DOB: 1941-01-29 Today's Date: 05/16/2021   History of present illness Pt admitted for ARF secondary to covid with complaints of falls and SOB symptoms. History includes HTN, HLD, DM, GERD, and MI.   OT comments  Pt seen for OT tx today. Pt performed bed mobility with modified independence, supervision for ADL transfers + VC for hand placement/safety, MIN A to wash his back while seated. Pt able to perform BLE bathing and LB dressing from seated position using figure 4 method. Pt negotiated room with RW requiring Supv-CGA and intermittent VC for safety/RW mgt. Pt is a high falls risk. Demo's decreased safety awareness. Pt eager to return home, however, currently has no assistance available to him to facilitate a safe discharge home, as his neighbor is sick. Pt would benefit from additional skilled OT services and intermittent supervision/assist for safety. Recommend SNF at discharge for additional rehab to address noted impairments in order to facilitate safe return home at later date with minimize caregiver burden.    Recommendations for follow up therapy are one component of a multi-disciplinary discharge planning process, led by the attending physician.  Recommendations may be updated based on patient status, additional functional criteria and insurance authorization.    Follow Up Recommendations  Skilled nursing-short term rehab (<3 hours/day)    Assistance Recommended at Discharge Frequent or constant Supervision/Assistance  Equipment Recommendations  None recommended by OT    Recommendations for Other Services      Precautions / Restrictions Precautions Precautions: Fall Precaution Comments: no longer on covid precautions, 10 days up Restrictions Weight Bearing Restrictions: No       Mobility Bed Mobility Overal bed mobility: Modified Independent Bed Mobility: Sit to Supine;Supine to Sit      Supine to sit: Modified independent (Device/Increase time) Sit to supine: Modified independent (Device/Increase time)        Transfers Overall transfer level: Needs assistance Equipment used: Rolling walker (2 wheels) Transfers: Sit to/from Stand Sit to Stand: Supervision                 Balance Overall balance assessment: History of Falls;Needs assistance Sitting-balance support: Feet supported;No upper extremity supported Sitting balance-Leahy Scale: Good     Standing balance support: Bilateral upper extremity supported;During functional activity Standing balance-Leahy Scale: Good                             ADL either performed or assessed with clinical judgement   ADL Overall ADL's : Needs assistance/impaired     Grooming: Sitting;Modified independent   Upper Body Bathing: Sitting;Set up;Supervision/ safety   Lower Body Bathing: Sitting/lateral leans;Set up;Supervison/ safety       Lower Body Dressing: Sitting/lateral leans;Set up;Supervision/safety                      Extremity/Trunk Assessment              Vision       Perception     Praxis      Cognition Arousal/Alertness: Awake/alert Behavior During Therapy: WFL for tasks assessed/performed Overall Cognitive Status: No family/caregiver present to determine baseline cognitive functioning                                 General Comments: decreased safety awareness  Exercises Other Exercises Other Exercises: Seated sink bath w/ grooming and seated dressing tasks completed with remote supervision after set up. Up to bathroom with supervision + RW.   Shoulder Instructions       General Comments      Pertinent Vitals/ Pain       Pain Assessment: 0-10 Pain Score: 10-Worst pain ever Pain Location: R lateral neck pain Pain Descriptors / Indicators: Aching Pain Intervention(s): Limited activity within patient's tolerance;Monitored during  session;Repositioned;Patient requesting pain meds-RN notified  Home Living                                          Prior Functioning/Environment              Frequency  Min 2X/week        Progress Toward Goals  OT Goals(current goals can now be found in the care plan section)  Progress towards OT goals: Progressing toward goals  Acute Rehab OT Goals Patient Stated Goal: to go home OT Goal Formulation: With patient Time For Goal Achievement: 05/22/21 Potential to Achieve Goals: Good  Plan Frequency remains appropriate;Discharge plan remains appropriate    Co-evaluation                 AM-PAC OT "6 Clicks" Daily Activity     Outcome Measure   Help from another person eating meals?: None Help from another person taking care of personal grooming?: None Help from another person toileting, which includes using toliet, bedpan, or urinal?: A Little Help from another person bathing (including washing, rinsing, drying)?: A Little Help from another person to put on and taking off regular upper body clothing?: None Help from another person to put on and taking off regular lower body clothing?: None 6 Click Score: 22    End of Session Equipment Utilized During Treatment: Rolling walker (2 wheels);Gait belt  OT Visit Diagnosis: Unsteadiness on feet (R26.81);History of falling (Z91.81);Muscle weakness (generalized) (M62.81)   Activity Tolerance Patient tolerated treatment well   Patient Left in bed;with call bell/phone within reach;with bed alarm set   Nurse Communication Patient requests pain meds        Time: 1610-9604 OT Time Calculation (min): 29 min  Charges: OT General Charges $OT Visit: 1 Visit OT Treatments $Self Care/Home Management : 23-37 mins  Ardeth Perfect., MPH, MS, OTR/L ascom [phone] 05/16/21, 3:14 PM

## 2021-05-17 DIAGNOSIS — I5033 Acute on chronic diastolic (congestive) heart failure: Secondary | ICD-10-CM | POA: Diagnosis not present

## 2021-05-17 DIAGNOSIS — J690 Pneumonitis due to inhalation of food and vomit: Secondary | ICD-10-CM | POA: Diagnosis not present

## 2021-05-17 DIAGNOSIS — W19XXXA Unspecified fall, initial encounter: Secondary | ICD-10-CM | POA: Diagnosis not present

## 2021-05-17 DIAGNOSIS — J9601 Acute respiratory failure with hypoxia: Secondary | ICD-10-CM | POA: Diagnosis not present

## 2021-05-17 LAB — GLUCOSE, CAPILLARY
Glucose-Capillary: 76 mg/dL (ref 70–99)
Glucose-Capillary: 281 mg/dL — ABNORMAL HIGH (ref 70–99)
Glucose-Capillary: 176 mg/dL — ABNORMAL HIGH (ref 70–99)
Glucose-Capillary: 190 mg/dL — ABNORMAL HIGH (ref 70–99)

## 2021-05-17 LAB — TSH: TSH: 1.47 u[IU]/mL (ref 0.350–4.500)

## 2021-05-17 MED ORDER — INSULIN GLARGINE 100 UNIT/ML ~~LOC~~ SOLN: 30.0000 [IU] | Freq: Every day | SUBCUTANEOUS | 3 refills | Status: DC

## 2021-05-17 MED ORDER — INSULIN GLARGINE-YFGN 100 UNIT/ML ~~LOC~~ SOLN: 30.0000 [IU] | Freq: Every day | SUBCUTANEOUS | 11 refills | Status: DC

## 2021-05-17 MED ORDER — ACETAMINOPHEN 325 MG PO TABS: 650.0000 mg | ORAL_TABLET | Freq: Four times a day (QID) | ORAL | Status: DC | PRN

## 2021-05-17 MED ORDER — PANTOPRAZOLE SODIUM 40 MG PO TBEC: 40.0000 mg | DELAYED_RELEASE_TABLET | Freq: Every day | ORAL | Status: DC

## 2021-05-17 NOTE — Discharge Summary (Addendum)
Physician Discharge Summary  Peter Robertson SHF:[id number] DOB: 14-May-1941 DOA: 05/05/2021  PCP: Danae Orleans, MD  Admit date: 05/05/2021 Discharge date: 05/17/2021  Admitted From: Home Disposition: Skilled nursing facility  Recommendations for Outpatient Follow-up:  Follow up with PCP within 1-2 weeks to monitor Bps with the medication changes listed below. Follow up with TSH results (pending at dc) Follow up with IR outpatient to complete thyroid nodule FNA Bx  Discharge Condition:stable, improved CODE STATUS:  Code Status: Full Code  Regular healthy diet  Brief/Interim Summary: Pt presented initially due to a fall with only minor injuries. On presentation, he was found to have acute respiratory failure that initially required bipap for a brief time in the setting of being COVID-19 positive. He completed remdesivir treatment and able to wean to room air pretty quickly. His respiratory symptoms resolved but his discharge was delayed due to generalized deconditioning from his illness requiring SNF level care at discharge. He remained at the hospital until his COVID isolation requirements were met by the facility and was discharged in stable condition.   Additionally,  - patient was discontinued from his home imdur and lisinopril due to his blood pressures being well controlled on metoprolol alone and were hypotensive with the additional agents.  - had incidental finding of >1cm thyroid nodule on imaging which was [place] and recommended biopsy. Order was placed and IR was consulted to complete biopsy outpatient. Patient was notified of this finding and the need for follow up.   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)   Hyperlipidemia   Hypertension   Elevated troponin   Depression   CKD (chronic kidney disease), stage IIIa   Thyroid nodule   Fall at home, initial encounter   Acute on chronic diastolic CHF (congestive heart failure) (HCC)   Head injury   Weakness generalized   Thyroid nodule greater than or equal to 1 cm in diameter incidentally noted on imaging study    Allergies as of 05/17/2021       Reactions   Sulfamethoxazole-trimethoprim Other (See Comments)   dizziness dizziness dizziness   Tape Rash   blisters        Medication List     STOP taking these medications    furosemide 20 MG tablet Commonly known as: LASIX   insulin regular 100 units/mL injection Commonly known as: NOVOLIN R   isosorbide mononitrate 120 MG 24 hr tablet Commonly known as: IMDUR   lisinopril 10 MG tablet Commonly known as: ZESTRIL   metFORMIN 500 MG tablet Commonly known as: GLUCOPHAGE   omeprazole 40 MG capsule Commonly known as: PRILOSEC Replaced by: pantoprazole 40 MG tablet       TAKE these medications    acetaminophen 325 MG tablet Commonly known as: TYLENOL Take 2 tablets (650 mg total) by mouth every 6 (six) hours as needed for mild pain or fever. What changed:  medication strength how much to take when to take this reasons to take this   atorvastatin 40 MG tablet Commonly known as: LIPITOR Take 40 mg by mouth daily before supper.   cholecalciferol 25 MCG (1000 UNIT) tablet Commonly known as: VITAMIN D3 Take 1,000 Units by mouth daily.   clopidogrel  75 MG tablet Commonly known as: PLAVIX Take 75 mg by mouth daily.   gabapentin 100 MG capsule Commonly known as: NEURONTIN Take 100 mg by mouth at bedtime.   insulin glargine 100 UNIT/ML injection Commonly known as: LANTUS Inject 0.3 mLs (30 Units total) into the skin daily. What changed:  how much to take when to take this   Melatonin 10 MG Caps Take 10 mg by mouth at  bedtime as needed (sleep).   metoprolol succinate 50 MG 24 hr tablet Commonly known as: TOPROL-XL 50 mg daily. (Take with 141m tablet to equal 1544mtotal) What changed: Another medication with the same name was removed. Continue taking this medication, and follow the directions you see here.   nitroGLYCERIN 0.4 MG SL tablet Commonly known as: NITROSTAT Place 0.4 mg under the tongue every 5 (five) minutes as needed for chest pain.   pantoprazole 40 MG tablet Commonly known as: PROTONIX Take 1 tablet (40 mg total) by mouth daily. Start taking on: May 18, 2021 Replaces: omeprazole 40 MG capsule   PreserVision AREDS 2+Multi Vit Caps Take 1 capsule by mouth 2 (two) times daily.   sertraline 100 MG tablet Commonly known as: ZOLOFT Take 200 mg by mouth in the morning.   tamsulosin 0.4 MG Caps capsule Commonly known as: FLOMAX Take 0.8 mg by mouth at bedtime.   vitamin B-12 1000 MCG tablet Commonly known as: CYANOCOBALAMIN Take 1,000 mcg by mouth daily.        Contact information for follow-up providers     LuDanae OrleansMD .   Specialty: Internal Medicine Why: As needed Contact information: [address] [facility] [id number].   Why: A scheduler from our office will call you to arrange a date/time for your thyroid biopsy. Please call our office with any questions prior to your appointment. Contact information: [phone]            Contact information for after-discharge care     [place] [facility] .   Service: Skilled Nursing Contact information: [phone]                  Allergies  Allergen Reactions   Sulfamethoxazole-Trimethoprim Other  (See Comments)    dizziness dizziness dizziness    Tape Rash    blisters    Consultations: IR  Procedures/Studies: CT Abdomen Pelvis Wo Contrast  Result Date: 05/06/2021 CLINICAL DATA:  [age]ear old male with acute onset vomiting. EXAM: CT ABDOMEN AND PELVIS WITHOUT CONTRAST TECHNIQUE: Multidetector CT imaging of the abdomen and pelvis was performed following the standard protocol without IV contrast. COMPARISON:  CT Abdomen and Pelvis 02/12/2020, and earlier. Chest CT 08/27/2019. FINDINGS: Lower chest: Chronic nodular lung disease, with frequent bulky calcified pulmonary nodules, and chronic coarse pleural calcifications in both costophrenic angles. Superimposed small new layering right pleural effusion, and trace effusion on the left. No cardiomegaly. No pericardial effusion. Hepatobiliary: Negative noncontrast liver. Possible gallbladder layering sludge on series 2, image 33. But no gallbladder distension or pericholecystic inflammation. Pancreas: Chronic dystrophic calcifications throughout the pancreatic head and uncinate appears stable since last year. No pancreatic inflammation is evident. Spleen: Chronic splenomegaly. Estimated splenic volume is 840 mL (normal splenic  volume range 83 - 412 mL). But this has only mildly increased since 2017. No discrete splenic lesion. No perisplenic fluid. Adrenals/Urinary Tract: Adrenal glands are stable and negative. Right kidney appears nonobstructed and stable since 2017. No right hydroureter. Bulky 11 mm left lower pole calculus. Chronic asymmetric left renal pelvis appears stable since 2017. No left hydroureter. No acute pararenal or periureteral inflammation. Chronic mildly thick-walled urinary bladder with an anterior small bladder diverticulum unchanged on series 2, image 75. Chronic pelvic phleboliths. Stomach/Bowel: Negative large bowel aside from redundancy and occasional diverticula. The cecum is located on a lax mesentery in the anterior right  upper quadrant. Normal appendix on series 2, image 48. Negative terminal ileum. No dilated small bowel. Gas containing but otherwise negative stomach. Fluid in the duodenum. Small distal duodenal diverticulum without active inflammation on series 2, image 36. No free air or free fluid. Vascular/Lymphatic: Aortoiliac calcified atherosclerosis. Normal caliber abdominal aorta. Vascular patency is not evaluated in the absence of IV contrast. There are chronic, matted aortocaval lymph nodes in the lower retroperitoneum (series 2, image 49) which are stable since 2017. No other lymphadenopathy is evident on this noncontrast exam. Reproductive: Chronic prostatomegaly and small fat containing inguinal hernias. Other: No pelvic free fluid. Musculoskeletal: Chronic posterior left lower rib fractures. Chronic L1 compression fracture is stable since last year. No acute osseous abnormality identified. IMPRESSION: 1. A small layering right pleural effusion is new since last year. But otherwise visible extensive nodular, partially calcified chronic lung and pleural disease is stable. 2. No acute or inflammatory process identified in the non-contrast abdomen or pelvis. 3. Bulky left lower pole renal calculus is stable with no convincing obstructive uropathy. Chronic splenomegaly is nonspecific and only mildly progressed since 2017. And superimposed mild chronic retroperitoneal lymphadenopathy is stable since 2017. Chronic bladder wall thickening and small bladder diverticulum. Chronic prostatomegaly. Aortic Atherosclerosis (ICD10-I70.0). Electronically Signed   By: Genevie Ann M.D.   On: 05/06/2021 05:49   DG Chest 2 View  Result Date: 05/05/2021 CLINICAL DATA:  Status post fall. EXAM: CHEST - 2 VIEW COMPARISON:  August 30, 2020 FINDINGS: Multiple sternal wires are seen. Bilateral chronic appearing lung markings are noted with multiple calcified lung nodules of various sizes seen bilaterally. There is no evidence of acute  infiltrate, pleural effusion or pneumothorax. The cardiac silhouette is within normal limits. The visualized skeletal structures are unremarkable. IMPRESSION: 1. Evidence of prior median sternotomy with findings consistent with history of sarcoidosis. 2. No acute or active cardiopulmonary disease. Electronically Signed   By: Virgina Norfolk M.D.   On: 05/05/2021 21:31   CT HEAD WO CONTRAST (5MM)  Result Date: 05/05/2021 CLINICAL DATA:  Recent fall with headaches and neck pain, initial encounter EXAM: CT HEAD WITHOUT CONTRAST CT CERVICAL SPINE WITHOUT CONTRAST TECHNIQUE: Multidetector CT imaging of the head and cervical spine was performed following the standard protocol without intravenous contrast. Multiplanar CT image reconstructions of the cervical spine were also generated. COMPARISON:  08/30/2020, CT of the head from earlier in the same day. FINDINGS: CT HEAD FINDINGS Brain: The area of increased attenuation adjacent to the left frontal cortex is no longer identified and felt to be artifactual in nature likely related to mild motion and beam hardening artifact. Mild atrophic changes are seen. No findings to suggest acute hemorrhage, acute infarction or space-occupying mass lesion is noted. Vascular: No hyperdense vessel or unexpected calcification. Skull: Normal. Negative for fracture or focal lesion. Sinuses/Orbits: No acute finding. Other: None. CT CERVICAL SPINE  FINDINGS Alignment: Mild straightening of the normal cervical lordosis is noted. Skull base and vertebrae: 7 cervical segments are well visualized. Vertebral body height is well maintained. Mild osteophytic changes are noted from C4-C7. Mild anterolisthesis of C4 on C5 is seen. Facet hypertrophic changes are noted bilaterally. No acute fracture or acute facet abnormality is seen. Soft tissues and spinal canal: Surrounding soft tissue structures are within normal limits with the exception of a hypodense nodule within the right lobe of the  thyroid measuring 19 mm. This is stable in appearance from the prior CT. Upper chest: Visualized lung apices demonstrate biapical scarring. Right-sided pleural effusion is noted new from the prior exam. Chest x-ray is recommended for further evaluation. Other: None IMPRESSION: CT of the head: No acute intracranial abnormality noted. The previously seen area of suggested hemorrhage is felt to be artifactual in nature related to motion artifact. CT of the cervical spine: Multi level degenerative changes similar to that seen on prior exams. 1.9 cm right thyroid nodule. Recommend nonemergent thyroid US (ref: J Am Coll Radiol. 2015 Feb;12(2): 143-50). New right-sided pleural effusion. Chest x-ray is recommended for further evaluation. Electronically Signed   By: Inez Catalina M.D.   On: 05/05/2021 20:16   CT Head Wo Contrast  Result Date: 05/05/2021 CLINICAL DATA:  Trauma, fall EXAM: CT HEAD WITHOUT CONTRAST TECHNIQUE: Contiguous axial images were obtained from the base of the skull through the vertex without intravenous contrast. COMPARISON:  08/30/2020 FINDINGS: Brain: Motion artifacts are seen in the some of the images. Additional images were obtained.rrw in image 19 of series 3, there is 8 x 4 mm area of subtle increased density in the left frontal cortex. Area of this finding is not included in the repeated images. There is no significant focal mass effect. There is no demonstrable epidural or subdural fluid collection. Ventricles are prominent. There is no shift of midline structures. Cortical sulci are prominent. There is subtle decreased density in the periventricular white matter. Vascular: Unremarkable. Skull: No fracture is seen in calvarium. Sinuses/Orbits: Unremarkable. Other: None IMPRESSION: There is 8 mm subtle increased density in the left frontal cortex close to the inner table of calvarium. This may suggest small focus of petechial hemorrhage due to contusion. In view of motion artifacts,  possibility an artifact is not excluded. Short-term follow-up CT or MRI in 2-3 hours may be considered. Atrophy. Small-vessel disease. Electronically Signed   By: Elmer Picker M.D.   On: 05/05/2021 16:45   CT Cervical Spine Wo Contrast  Result Date: 05/05/2021 CLINICAL DATA:  Recent fall with headaches and neck pain, initial encounter EXAM: CT HEAD WITHOUT CONTRAST CT CERVICAL SPINE WITHOUT CONTRAST TECHNIQUE: Multidetector CT imaging of the head and cervical spine was performed following the standard protocol without intravenous contrast. Multiplanar CT image reconstructions of the cervical spine were also generated. COMPARISON:  08/30/2020, CT of the head from earlier in the same day. FINDINGS: CT HEAD FINDINGS Brain: The area of increased attenuation adjacent to the left frontal cortex is no longer identified and felt to be artifactual in nature likely related to mild motion and beam hardening artifact. Mild atrophic changes are seen. No findings to suggest acute hemorrhage, acute infarction or space-occupying mass lesion is noted. Vascular: No hyperdense vessel or unexpected calcification. Skull: Normal. Negative for fracture or focal lesion. Sinuses/Orbits: No acute finding. Other: None. CT CERVICAL SPINE FINDINGS Alignment: Mild straightening of the normal cervical lordosis is noted. Skull base and vertebrae: 7 cervical segments are well visualized.  Vertebral body height is well maintained. Mild osteophytic changes are noted from C4-C7. Mild anterolisthesis of C4 on C5 is seen. Facet hypertrophic changes are noted bilaterally. No acute fracture or acute facet abnormality is seen. Soft tissues and spinal canal: Surrounding soft tissue structures are within normal limits with the exception of a hypodense nodule within the right lobe of the thyroid measuring 19 mm. This is stable in appearance from the prior CT. Upper chest: Visualized lung apices demonstrate biapical scarring. Right-sided pleural  effusion is noted new from the prior exam. Chest x-ray is recommended for further evaluation. Other: None IMPRESSION: CT of the head: No acute intracranial abnormality noted. The previously seen area of suggested hemorrhage is felt to be artifactual in nature related to motion artifact. CT of the cervical spine: Multi level degenerative changes similar to that seen on prior exams. 1.9 cm right thyroid nodule. Recommend nonemergent thyroid US (ref: J Am Coll Radiol. 2015 Feb;12(2): 143-50). New right-sided pleural effusion. Chest x-ray is recommended for further evaluation. Electronically Signed   By: Inez Catalina M.D.   On: 05/05/2021 20:16   DG Chest Port 1 View  Result Date: 05/06/2021 CLINICAL DATA:  Shortness of breath EXAM: PORTABLE CHEST 1 VIEW COMPARISON:  05/05/2021 FINDINGS: 0430 hours. Interval progression of patchy bilateral airspace disease on a background of chronic interstitial change. Calcified lymph nodes are again noted in the mediastinum and hilar regions with calcified pleural/parenchymal lung nodules best appreciated at the bases. The cardiopericardial silhouette is within normal limits for size. Telemetry leads overlie the chest. IMPRESSION: Interval progression of patchy bilateral airspace disease suggesting pulmonary edema although diffuse infection cannot be excluded. Electronically Signed   By: Misty Stanley M.D.   On: 05/06/2021 04:57   ECHOCARDIOGRAM COMPLETE  Result Date: 05/09/2021    ECHOCARDIOGRAM REPORT   Patient Name:   MAKELL CYR Date of Exam: 05/09/2021 Medical Rec #:  [id number]     Height:       66.0 in Accession #:    [id number]    Weight:       178.4 lb Date of Birth:  [birth year]     BSA:          1.905 m Patient Age:    80 years      BP:           142/80 mmHg Patient Gender: M             HR:           78 bpm. Exam Location:  [facility] Procedure: 2D Echo, Cardiac Doppler and Color Doppler Indications:     CHF-acute diastolic I45.80  History:         Patient has prior  history of Echocardiogram examinations, most                  recent 05/21/2018. CAD and Previous Myocardial Infarction,                  Signs/Symptoms:Murmur; Risk Factors:Diabetes and Hypertension.                  CKD.  Sonographer:     Sherrie Sport Referring Phys:  [id number] SENDIL K [facility] Diagnosing Phys: Kathlyn Sacramento MD IMPRESSIONS  1. Left ventricular ejection fraction, by estimation, is 55 to 60%. The left ventricle has normal function. Left ventricular endocardial border not optimally defined to evaluate regional wall motion. There is moderate left ventricular hypertrophy. Left ventricular diastolic parameters are consistent  with Grade II diastolic dysfunction (pseudonormalization).  2. Right ventricular systolic function is normal. The right ventricular size is normal. There is mildly elevated pulmonary artery systolic pressure.  3. Left atrial size was mildly dilated.  4. Right atrial size was mildly dilated.  5. The mitral valve is normal in structure. No evidence of mitral valve regurgitation. No evidence of mitral stenosis.  6. The aortic valve is normal in structure. Aortic valve regurgitation is not visualized. Aortic valve sclerosis/calcification is present, without any evidence of aortic stenosis.  7. The inferior vena cava is normal in size with greater than 50% respiratory variability, suggesting right atrial pressure of 3 mmHg. FINDINGS  Left Ventricle: Left ventricular ejection fraction, by estimation, is 55 to 60%. The left ventricle has normal function. Left ventricular endocardial border not optimally defined to evaluate regional wall motion. The left ventricular internal cavity size was normal in size. There is moderate left ventricular hypertrophy. Left ventricular diastolic parameters are consistent with Grade II diastolic dysfunction (pseudonormalization). Right Ventricle: The right ventricular size is normal. No increase in right ventricular wall thickness. Right ventricular systolic  function is normal. There is mildly elevated pulmonary artery systolic pressure. The tricuspid regurgitant velocity is 2.78  m/s, and with an assumed right atrial pressure of 5 mmHg, the estimated right ventricular systolic pressure is 36.4 mmHg. Left Atrium: Left atrial size was mildly dilated. Right Atrium: Right atrial size was mildly dilated. Pericardium: There is no evidence of pericardial effusion. Mitral Valve: The mitral valve is normal in structure. No evidence of mitral valve regurgitation. No evidence of mitral valve stenosis. MV peak gradient, 5.3 mmHg. The mean mitral valve gradient is 2.0 mmHg. Tricuspid Valve: The tricuspid valve is normal in structure. Tricuspid valve regurgitation is mild . No evidence of tricuspid stenosis. Aortic Valve: The aortic valve is normal in structure. Aortic valve regurgitation is not visualized. Aortic valve sclerosis/calcification is present, without any evidence of aortic stenosis. Aortic valve mean gradient measures 3.5 mmHg. Aortic valve peak  gradient measures 5.0 mmHg. Aortic valve area, by VTI measures 2.84 cm. Pulmonic Valve: The pulmonic valve was normal in structure. Pulmonic valve regurgitation is not visualized. No evidence of pulmonic stenosis. Aorta: The aortic root is normal in size and structure. Venous: The inferior vena cava is normal in size with greater than 50% respiratory variability, suggesting right atrial pressure of 3 mmHg. IAS/Shunts: No atrial level shunt detected by color flow Doppler.  LEFT VENTRICLE PLAX 2D LVIDd:         4.30 cm   Diastology LVIDs:         3.00 cm   LV e' medial:    4.68 cm/s LV PW:         1.60 cm   LV E/e' medial:  20.9 LV IVS:        1.80 cm   LV e' lateral:   7.72 cm/s LVOT diam:     2.10 cm   LV E/e' lateral: 12.7 LV SV:         63 LV SV Index:   33 LVOT Area:     3.46 cm  LEFT ATRIUM           Index        RIGHT ATRIUM           Index LA diam:      3.80 cm 1.99 cm/m   RA Area:     20.40 cm LA Vol (A2C): 55.1 ml  28.93 ml/m  RA Volume:   65.50 ml  34.39 ml/m LA Vol (A4C): 25.9 ml 13.60 ml/m  AORTIC VALVE                    PULMONIC VALVE AV Area (Vmax):    2.74 cm     PV Vmax:        1.11 m/s AV Area (Vmean):   2.25 cm     PV Vmean:       70.400 cm/s AV Area (VTI):     2.84 cm     PV VTI:         0.182 m AV Vmax:           111.30 cm/s  PV Peak grad:   4.9 mmHg AV Vmean:          88.200 cm/s  PV Mean grad:   2.0 mmHg AV VTI:            0.223 m      RVOT Peak grad: 4 mmHg AV Peak Grad:      5.0 mmHg AV Mean Grad:      3.5 mmHg LVOT Vmax:         88.10 cm/s LVOT Vmean:        57.400 cm/s LVOT VTI:          0.183 m LVOT/AV VTI ratio: 0.82  AORTA Ao Root diam: 3.67 cm MITRAL VALVE               TRICUSPID VALVE MV Area (PHT): 5.16 cm    TR Peak grad:   30.9 mmHg MV Area VTI:   2.56 cm    TR Vmax:        278.00 cm/s MV Peak grad:  5.3 mmHg MV Mean grad:  2.0 mmHg    SHUNTS MV Vmax:       1.15 m/s    Systemic VTI:  0.18 m MV Vmean:      64.1 cm/s   Systemic Diam: 2.10 cm MV Decel Time: 147 msec    Pulmonic VTI:  0.169 m MV E velocity: 97.70 cm/s MV A velocity: 78.00 cm/s MV E/A ratio:  1.25 Kathlyn Sacramento MD Electronically signed by Kathlyn Sacramento MD Signature Date/Time: 05/09/2021/2:35:00 PM    Final    US THYROID  Result Date: 05/16/2021 CLINICAL DATA:  Thyroid nodule by CT EXAM: THYROID ULTRASOUND TECHNIQUE: Ultrasound examination of the thyroid gland and adjacent soft tissues was performed. COMPARISON:  05/05/2021 cervical spine CT FINDINGS: Parenchymal Echotexture: Mildly heterogenous Isthmus: 2.2 mm Right lobe: 4.8 x 2.4 x 2.1 cm Left lobe: 3.7 x 2.0 x 1.8 cm _________________________________________________________ Estimated total number of nodules >/= 1 cm: 1 Number of spongiform nodules >/=  2 cm not described below (TR1): 0 Number of mixed cystic and solid nodules >/= 1.5 cm not described below (TR2): 0 _________________________________________________________ Nodule # 1: Location: Right; Mid Maximum size: 2.5  cm; Other 2 dimensions: 1.8 x 1.6 cm Composition: solid/almost completely solid (2) Echogenicity: isoechoic (1) Shape: not taller-than-wide (0) Margins: ill-defined (0) Echogenic foci: none (0) ACR TI-RADS total points: 3. ACR TI-RADS risk category: TR3 (3 points). ACR TI-RADS recommendations: **Given size (>/= 2.5 cm) and appearance, fine needle aspiration of this mildly suspicious nodule should be considered based on TI-RADS criteria. _________________________________________________________ Left lower pole dystrophic calcification measures 3 mm. No hypervascularity.  No regional adenopathy. IMPRESSION: 2.5 cm right mid thyroid TR 3 nodule meets criteria for biopsy as above. This correlates with  the CT finding. The above is in keeping with the ACR TI-RADS recommendations - J Am Coll Radiol 2017;14:587-595. Electronically Signed   By: Jerilynn Mages.  Shick M.D.   On: 05/16/2021 10:47    Subjective: Patient feels well overall. He really wants to go straight home. He understands that he is going to SNF for further rehab prior to going back home. All his questions and concerns were addressed prior to dc.   Discharge Exam: Vitals:   05/17/21 0725 05/17/21 1113  BP: 133/71 (!) 125/56  Pulse: 81 91  Resp: 18 18  Temp: 98.2 F (36.8 C) 98.3 F (36.8 C)  SpO2: 100% 98%    General: Pt is alert, awake, not in acute distress Cardiovascular: RRR, S1/S2 +, no rubs, no gallops Respiratory: CTA bilaterally, no wheezing, no rhonchi Abdominal: Soft, NT, ND Extremities: no edema, no cyanosis  Labs: Basic Metabolic Panel: Recent Labs  Lab 05/12/21 0603  NA 135  K 4.1  CL 101  CO2 26  GLUCOSE 116*  BUN 70*  CREATININE 2.16*  CALCIUM 8.9    Time coordinating discharge: Over 30 minutes  Richarda Osmond, MD  [facility] 05/17/2021, 2:01 PM Pager   If 7PM-7AM, please contact night-coverage [email or web address] Password [email or web address]

## 2021-05-17 NOTE — Progress Notes (Signed)
Physical Therapy Treatment Patient Details Name: Peter Robertson MRN: [id number] DOB: [birth year] Today's Date: 05/17/2021   History of Present Illness Pt admitted for ARF secondary to covid with complaints of falls and SOB symptoms. History includes HTN, HLD, DM, GERD, and MI.    PT Comments    Pt received supine in bed, agreeable to therapy. Initial STS was unsuccessful as pt feet slipped out from underneath him and BUE were on RW. Second STS performed with verbal guidance of PT with CGA to steady. 3 additional STS were completed at end of session to practice proper form - assist required improved to SUP. Ambulation distance remains the same at 256ft using RW and MIN A provided. Pt drifts to the right and frequently runs into obstacles or encounters near misses. Would benefit from skilled PT to address above deficits and promote optimal return to PLOF.   Recommendations for follow up therapy are one component of a multi-disciplinary discharge planning process, led by the attending physician.  Recommendations may be updated based on patient status, additional functional criteria and insurance authorization.  Follow Up Recommendations  Skilled nursing-short term rehab (<3 hours/day)     Assistance Recommended at Discharge Frequent or constant Supervision/Assistance  Equipment Recommendations  Rolling walker (2 wheels)    Recommendations for Other Services       Precautions / Restrictions Precautions Precautions: Fall Precaution Comments: no longer on covid precautions, 10 days up Restrictions Weight Bearing Restrictions: No     Mobility  Bed Mobility Overal bed mobility: Modified Independent Bed Mobility: Sit to Supine;Supine to Sit     Supine to sit: Modified independent (Device/Increase time) Sit to supine: Modified independent (Device/Increase time)        Transfers Overall transfer level: Needs assistance Equipment used: Rolling walker (2 wheels) Transfers: Sit  to/from Stand Sit to Stand: Min guard           General transfer comment: Initial STS required 2 attempts. Cues for hand/feet placement, general safety. Practiced x3 reps with correct positioning at end of sesison.    Ambulation/Gait Ambulation/Gait assistance: Min assist Gait Distance (Feet): 200 Feet Assistive device: Rolling walker (2 wheels) Gait Pattern/deviations: Step-through pattern;Decreased step length - right;Decreased step length - left;Drifts right/left;Decreased stride length;Wide base of support;Trunk flexed Gait velocity: decreased     General Gait Details: very slow gait with short shuffling steps, tendency to drift to the right. difficulty navigating obstacles, pt corrected direction after hitting object. Frequent cueing to increase step length. Easily distracted with decreased safety awareness.   Stairs             Wheelchair Mobility    Modified Rankin (Stroke Patients Only)       Balance Overall balance assessment: History of Falls;Needs assistance Sitting-balance support: No upper extremity supported;Feet supported Sitting balance-Leahy Scale: Good     Standing balance support: Bilateral upper extremity supported;During functional activity Standing balance-Leahy Scale: Fair Standing balance comment: some lateral drifting to the right while ambulating with RW                            Cognition Arousal/Alertness: Awake/alert Behavior During Therapy: WFL for tasks assessed/performed Overall Cognitive Status: Within Functional Limits for tasks assessed                                 General Comments: decreased safety awareness  Exercises      General Comments        Pertinent Vitals/Pain Pain Assessment: No/denies pain    Home Living                          Prior Function            PT Goals (current goals can now be found in the care plan section) Acute Rehab PT Goals Patient  Stated Goal: to get stronger PT Goal Formulation: With patient Time For Goal Achievement: 05/21/21 Potential to Achieve Goals: Good    Frequency    Min 2X/week      PT Plan      Co-evaluation              AM-PAC PT "6 Clicks" Mobility   Outcome Measure  Help needed turning from your back to your side while in a flat bed without using bedrails?: A Little Help needed moving from lying on your back to sitting on the side of a flat bed without using bedrails?: A Little Help needed moving to and from a bed to a chair (including a wheelchair)?: A Little Help needed standing up from a chair using your arms (e.g., wheelchair or bedside chair)?: A Little Help needed to walk in hospital room?: A Little Help needed climbing 3-5 steps with a railing? : A Lot 6 Click Score: 17    End of Session Equipment Utilized During Treatment: Gait belt Activity Tolerance: Patient tolerated treatment well Patient left: with call bell/phone within reach;in bed;with bed alarm set Nurse Communication: Mobility status PT Visit Diagnosis: Muscle weakness (generalized) (M62.81);Difficulty in walking, not elsewhere classified (R26.2);Unsteadiness on feet (R26.81)     Time: 5277-8242 PT Time Calculation (min) (ACUTE ONLY): 23 min  Charges:  $Gait Training: 8-22 mins $Therapeutic Activity: 8-22 mins                     Patrina Levering PT, DPT 05/17/21 4:00 PM [phone]

## 2021-05-17 NOTE — TOC Progression Note (Addendum)
Transition of Care [facility]) - Progression Note    Patient Details  Name: Peter Robertson MRN: [id number] Date of Birth: 1940/07/19  Transition of Care [facility]) CM/SW [place], LCSW Phone Number: 05/17/2021, 10:01 AM  Clinical Narrative:  Insurance authorization still pending. Neighbor said patient told his brother he was going home today, not rehab. Told her plan was for [facility] and she was very upset about this facility choice. Explained that patient is AOx4 and we have to go with what he decides. She is going to call him and see if he will accept bed offer from [facility].  11:14 am: Confirmed with patient that he is agreeable to [facility]. They are still able to offer him a bed and can accept him today if auth approved. [facility] and switched facility to [facility]. Insurance authorization still pending.  2:19 pm: Insurance authorization still pending.  4:54 pm: Insurance authorization denied. They called TOC coworker to request peer-to-peer but she is not here today. Unable to get peer-to-peer. Started appeals process. Requested expedited appeal. If he meets criteria for expedited takes up to 72 hours for determination. Updated HCPOA and SNF admissions coordinator.  Expected Discharge Plan: [place] Barriers to Discharge: Continued Medical Work up  Expected Discharge Plan and Services Expected Discharge Plan: [place] Choice: [place] arrangements for the past 2 months: Single Family Home                                       Social Determinants of Health (SDOH) Interventions    Readmission Risk Interventions No flowsheet data found.

## 2021-05-18 DIAGNOSIS — J9601 Acute respiratory failure with hypoxia: Secondary | ICD-10-CM | POA: Diagnosis not present

## 2021-05-18 LAB — GLUCOSE, CAPILLARY
Glucose-Capillary: 88 mg/dL (ref 70–99)
Glucose-Capillary: 143 mg/dL — ABNORMAL HIGH (ref 70–99)
Glucose-Capillary: 249 mg/dL — ABNORMAL HIGH (ref 70–99)

## 2021-05-18 NOTE — Progress Notes (Signed)
PROGRESS NOTE  Peter Robertson    DOB: 1941-03-01, 80 y.o.  EXN:[id number]  PCP: Danae Orleans, MD   Code Status: Full Code   DOA: 05/05/2021   LOS: 12  Brief Narrative of Current Hospitalization  Peter Robertson is a 80 y.o. male with a PMH significant for HTN, Type 2 DM, CKDIIIa, HFpEF, CAD, dementia. They presented from home to the ED on 05/05/2021 with fall and head injury.  In the ED, it was found that they had acute respiratory distress with COVID and fluid overload. They were treated with oxygen, BiPAP, steroids, breathing treatments, remdesivir.  Patient was admitted to medicine service for further workup and management of acute hypoxic respiratory failure in setting of COVID as outlined in detail below.  Patient was discharged yesterday but insurance authorization was denied.  Appeal filed.  Patient wants to go home but unsafe discharge as he lives alone.  Also found to have a thyroid nodule which meets criteria for biopsy.  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  Completed remdesivir.  Patient able to discharge to SNF once COVID-19 isolation precautions met by SNF.  Patient strongly wishes to go home. Off airborn precautions now.  He was discharged  to SNF yesterday but apparently insurance authorization was Sport and exercise psychologist. -PT/OT to continue evaluation for improvement and treatments to increase strength -Mucinex as needed -Combivent twice daily  Type II DM-hemoglobin A1c 7.1 on admission which is below goal of 8 for age.  Blood sugars well controlled, were elevated early on admission due to steroid administration. -Changing insulin administration to a.m. dosing for better safety profile  -30 units Semglee a.m. -discontinued sliding scale  CKD IIIb-chronic, stable -Avoid nephrotoxic agents -BMP a.m.  Dementia without behavioral disturbance  depression -Continue home sertraline -Delirium precautions  HLD-chronic, not on home medications  HTN-chronic, moderately well controlled -Continue home metoprolol  Fall at home, initial encounter -PT/OT  Thyroid nodule Thyroid ultrasound with a 2.5 cm right mid thyroid TR 3 nodule meets criteria for biopsy.  BPH -Continue home Flomax  DVT prophylaxis: enoxaparin (LOVENOX) injection 30 mg Start: 05/13/21 2200 SCDs Start: 05/06/21 0739   Diet:  Diet Orders (From admission, onward)     Start     Ordered   05/06/21 0727  Diet heart healthy/carb modified Room service appropriate? Yes; Fluid consistency: Thin  Diet effective now       Question Answer Comment  Diet-HS Snack? Nothing   Room service appropriate? Yes   Fluid consistency: Thin      05/06/21 0726            Subjective 05/18/21    Patient was seen and examined today.  No new complaints.  Wants to go home, lives alone but  stating that his neighbors takes good care of him.  Disposition Plan & Communication  Patient status: Inpatient  Admitted From: Home Disposition: Skilled nursing facility Anticipated discharge date: Medically stable, pending appeal for insurance authorization. Family Communication: None Consults, Procedures, Significant Events  Consultants:  None  Procedures/significant events:   Thyroid US 11/28  Antimicrobials:  Anti-infectives (From admission, onward)    Start     Dose/Rate Route Frequency Ordered Stop   05/07/21 1000  remdesivir 100 mg in sodium chloride 0.9 % 100 mL IVPB       See Hyperspace for full Linked Orders Report.   100 mg 200 mL/hr over 30 Minutes Intravenous Daily 05/06/21 0649 05/10/21 1000   05/06/21 0700  remdesivir 200 mg in sodium chloride 0.9% 250 mL IVPB       See Hyperspace for full Linked Orders Report.   200 mg 580 mL/hr over 30 Minutes Intravenous Once 05/06/21 0649 05/06/21 1009   05/06/21 0515  Ampicillin-Sulbactam (UNASYN) 3 g in sodium chloride 0.9 % 100 mL IVPB        3 g 200 mL/hr over 30 Minutes Intravenous  Once 05/06/21 0512 05/06/21 0603       Objective   Vitals:   05/18/21 0617 05/18/21 0737 05/18/21 1101 05/18/21 1634  BP: 129/68 119/70 111/62 (!) 136/101  Pulse: 74 75 65 70  Resp: 18 18 19 17   Temp: 98.4 F (36.9 C) 98.1 F (36.7 C)  98.2 F (36.8 C)  TempSrc: Oral Oral  Oral  SpO2: 100% 98% 100% 99%  Weight:      Height:        Intake/Output Summary (Last 24 hours) at 05/18/2021 1758 Last data filed at 05/18/2021 1356 Gross per 24 hour  Intake 480 ml  Output 600 ml  Net -120 ml    Filed Weights   05/05/21 1528 05/06/21 2245 05/11/21 0511  Weight: 82.6 kg 80.9 kg 85 kg    Patient BMI: Body mass index is 30.25 kg/m.   Physical Exam: General.  Well-developed elderly man, in no acute distress. Pulmonary.  Lungs clear bilaterally, normal respiratory effort. CV.  Regular rate and rhythm, no JVD, rub or murmur. Abdomen.  Soft, nontender, nondistended, BS positive. CNS.  Alert and oriented .  No focal neurologic deficit. Extremities.  No edema, no cyanosis, pulses intact and symmetrical. Psychiatry.  Judgment and insight appears normal.   Labs   I have personally reviewed following labs and imaging studies CBC    Component Value Date/Time   WBC 6.7 05/08/2021 0436   RBC 4.30 05/08/2021 0436    HGB 12.0 (L) 05/08/2021 0436   HGB 15.1 09/29/2013 0350   HCT 35.9 (L) 05/08/2021 0436   HCT 42.9 09/29/2013 0350   PLT 100 (L) 05/08/2021 0436   PLT 127 (L) 09/29/2013 0350   MCV 83.5 05/08/2021 0436   MCV 83 09/29/2013 0350   MCH 27.9 05/08/2021 0436   MCHC 33.4 05/08/2021 0436   RDW 14.7 05/08/2021 0436   RDW 15.2 (H) 09/29/2013 0350   LYMPHSABS 0.3 (L) 05/07/2021 0631   LYMPHSABS 1.0 09/29/2013 0350   MONOABS 0.5 05/07/2021 0631   MONOABS 0.8 09/29/2013 0350   EOSABS 0.0 05/07/2021 0631   EOSABS 0.2 09/29/2013 0350   BASOSABS 0.0 05/07/2021 0631   BASOSABS 0.1 09/29/2013 0350   Imaging Studies  No results found. Medications   Scheduled Meds:  atorvastatin  40 mg Oral QAC supper   cholecalciferol  1,000 Units Oral Daily  enoxaparin (LOVENOX) injection  30 mg Subcutaneous Q24H   gabapentin  100 mg Oral QHS   insulin glargine-yfgn  30 Units Subcutaneous Daily   Ipratropium-Albuterol  2 puff Inhalation BID   metoprolol succinate  50 mg Oral Daily   multivitamin-lutein  1 capsule Oral BID   pantoprazole  40 mg Oral Daily   sertraline  200 mg Oral q AM   tamsulosin  0.8 mg Oral QHS   vitamin B-12  1,000 mcg Oral Daily   No recently discontinued medications to reconcile  LOS: 12 days   Time spent: >28mn  SLorella Nimrod MD [facility] 05/18/2021, 5:58 PM   Please refer to amion to contact the [facility] LtdAttending or Consulting provider for this pt  [email or web address] Available by Epic secure chat 7AM-7PM. If 7PM-7AM, please contact night-coverage

## 2021-05-18 NOTE — TOC Progression Note (Signed)
Transition of Care [facility]) - Progression Note    Patient Details  Name: Peter Robertson MRN: [id number] Date of Birth: Nov 15, 1940  Transition of Care [facility]) CM/SW Contact  Shelbie Hutching, RN Phone Number: 05/18/2021, 1:46 PM  Clinical Narrative:    Patient has agreed to wait on decision for insurance appeal and go to [facility] if approved.  [name], his neighbor, talked with him and convinced him to stay, he wanted to go home but he lives alone.     Expected Discharge Plan: [place] Barriers to Discharge: Continued Medical Work up  Expected Discharge Plan and Services Expected Discharge Plan: [place] Choice: [place] arrangements for the past 2 months: Single Family Home Expected Discharge Date: 05/17/21                                     Social Determinants of Health (SDOH) Interventions    Readmission Risk Interventions No flowsheet data found.

## 2021-05-19 DIAGNOSIS — J9601 Acute respiratory failure with hypoxia: Secondary | ICD-10-CM | POA: Diagnosis not present

## 2021-05-19 LAB — GLUCOSE, CAPILLARY
Glucose-Capillary: 119 mg/dL — ABNORMAL HIGH (ref 70–99)
Glucose-Capillary: 219 mg/dL — ABNORMAL HIGH (ref 70–99)
Glucose-Capillary: 211 mg/dL — ABNORMAL HIGH (ref 70–99)
Glucose-Capillary: 123 mg/dL — ABNORMAL HIGH (ref 70–99)

## 2021-05-19 MED ORDER — INSULIN ASPART 100 UNIT/ML IJ SOLN
0.0000 [IU] | Freq: Three times a day (TID) | INTRAMUSCULAR | Status: DC
  Administered 2021-05-19 (×2): 5 [IU] via SUBCUTANEOUS
  Administered 2021-05-20: 2 [IU] via SUBCUTANEOUS
  Administered 2021-05-20: 3 [IU] via SUBCUTANEOUS
  Administered 2021-05-21 (×2): 2 [IU] via SUBCUTANEOUS
  Administered 2021-05-22: 13:00:00 5 [IU] via SUBCUTANEOUS
  Filled 2021-05-19 (×7): qty 1

## 2021-05-19 MED ORDER — INSULIN ASPART 100 UNIT/ML IJ SOLN
0.0000 [IU] | Freq: Every day | INTRAMUSCULAR | Status: DC
  Administered 2021-05-20: 2 [IU] via SUBCUTANEOUS
  Filled 2021-05-19: qty 1

## 2021-05-19 NOTE — TOC Transition Note (Signed)
Transition of Care [facility]) - CM/SW Discharge Note   Patient Details  Name: Peter Robertson MRN: [id number] Date of Birth: [birth year]  Transition of Care [facility]) CM/SW Contact:  Shelbie Hutching, RN Phone Number: 05/19/2021, 11:55 AM   Clinical Narrative:    Patient medically cleared for discharge to [facility].  [employer] has approved for patient to go to rehab.  Patient will be going to room 510.  Bedside RN will call report.  RNCM will set up EMS transport once auth details have come through.  Message left with neighbor [name] for return call to update her on approval of appeal and DC today.     Final next level of care: Skilled Nursing Facility Barriers to Discharge: Barriers Resolved   Patient Goals and CMS Choice Patient states their goals for this hospitalization and ongoing recovery are:: Patient agrees to rehab at [facility].gov Compare Post Acute Care list provided to:: Patient Choice offered to / list presented to : Patient  Discharge Placement              Patient chooses bed at: [facility] Patient to be transferred to facility by: [facility] Name of family member notified: [name]- friend Patient and family notified of of transfer: 05/19/21  Discharge Plan and Services     Post Acute Care Choice: [place]          DME Arranged: N/A DME Agency: NA       HH Arranged: NA HH Agency: NA        Social Determinants of Health (SDOH) Interventions     Readmission Risk Interventions No flowsheet data found.

## 2021-05-19 NOTE — TOC Progression Note (Signed)
Transition of Care [facility]) - Progression Note    Patient Details  Name: Peter Robertson MRN: [id number] Date of Birth: 1940-08-09  Transition of Care [facility]) CM/SW [place], LCSW Phone Number: 05/19/2021, 9:41 AM  Clinical Narrative: Received call from [facility] stating that appeal was approved: [id number]. No updates in [facility] portal yet to show this change. Sent notification to unit [facility] and [facility].  Expected Discharge Plan: [place] Barriers to Discharge: Continued Medical Work up  Expected Discharge Plan and Services Expected Discharge Plan: [place] Choice: [place] arrangements for the past 2 months: Single Family Home Expected Discharge Date: 05/17/21                                     Social Determinants of Health (SDOH) Interventions    Readmission Risk Interventions No flowsheet data found.

## 2021-05-19 NOTE — Care Management Important Message (Signed)
Important Message  Patient Details  Name: Peter Robertson MRN: [id number] Date of Birth: 1940/12/12   Medicare Important Message Given:  Yes     Juliann Pulse A Greenlee Ancheta 05/19/2021, 11:11 AM

## 2021-05-19 NOTE — Discharge Summary (Signed)
Physician Discharge Summary  Peter Robertson JGG:[id number] DOB: 08-30-1940 DOA: 05/05/2021  PCP: Danae Orleans, MD  Admit date: 05/05/2021 Discharge date: 05/19/2021  Admitted From: Home Disposition: Skilled nursing facility  Recommendations for Outpatient Follow-up:  Follow up with PCP within 1-2 weeks to monitor Bps with the medication changes listed below.  Follow up with IR outpatient to complete thyroid nodule FNA Bx  Discharge Condition:stable, improved CODE STATUS:  Code Status: Full Code  Regular healthy diet  Brief/Interim Summary: Pt presented initially due to a fall with only minor injuries. On presentation, he was found to have acute respiratory failure that initially required bipap for a brief time in the setting of being COVID-19 positive. He completed remdesivir treatment and able to wean to room air pretty quickly. His respiratory symptoms resolved but his discharge was delayed due to generalized deconditioning from his illness requiring SNF level care at discharge. He remained at the hospital until his COVID isolation requirements were met by the facility and was discharged in stable condition.   Additionally,  - patient was discontinued from his home imdur and lisinopril due to his blood pressures being well controlled on metoprolol alone and were hypotensive with the additional agents.  - had incidental finding of >1cm thyroid nodule on imaging which was [place] and recommended biopsy. Order was placed and IR was consulted to complete biopsy outpatient. Patient was notified of this finding and the need for follow up.   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)    Hyperlipidemia   Hypertension   Elevated troponin   Depression   CKD (chronic kidney disease), stage IIIa   Thyroid nodule   Fall at home, initial encounter   Acute on chronic diastolic CHF (congestive heart failure) (HCC)   Head injury   Weakness generalized   Thyroid nodule greater than or equal to 1 cm in diameter incidentally noted on imaging study   Discharge Instructions     Diet - low sodium heart healthy   Complete by: As directed    Increase activity slowly   Complete by: As directed       Allergies as of 05/19/2021       Reactions   Sulfamethoxazole-trimethoprim Other (See Comments)   dizziness dizziness dizziness   Tape Rash   blisters        Medication List     STOP taking these medications    furosemide 20 MG tablet Commonly known as: LASIX   insulin regular 100 units/mL injection Commonly known as: NOVOLIN R   isosorbide mononitrate 120 MG 24 hr tablet Commonly known as: IMDUR   lisinopril 10 MG tablet Commonly known as: ZESTRIL   metFORMIN 500 MG tablet Commonly known as: GLUCOPHAGE   omeprazole 40 MG capsule Commonly known as: PRILOSEC Replaced by: pantoprazole 40 MG tablet       TAKE these medications    acetaminophen 325 MG tablet Commonly known as: TYLENOL Take 2 tablets (650 mg total) by mouth every 6 (six) hours as needed for mild pain or fever. What changed:  medication strength how much to take when to take this reasons to take this   atorvastatin 40 MG tablet Commonly known as: LIPITOR Take 40  mg by mouth daily before supper.   cholecalciferol 25 MCG (1000 UNIT) tablet Commonly known as: VITAMIN D3 Take 1,000 Units by mouth daily.   clopidogrel 75 MG tablet Commonly known as: PLAVIX Take 75 mg by mouth daily.   gabapentin 100 MG capsule Commonly known as: NEURONTIN Take 100 mg by mouth at bedtime.   insulin glargine 100 UNIT/ML injection Commonly known as: LANTUS Inject 0.3 mLs (30 Units total) into the  skin daily. What changed:  how much to take when to take this   Melatonin 10 MG Caps Take 10 mg by mouth at bedtime as needed (sleep).   metoprolol succinate 50 MG 24 hr tablet Commonly known as: TOPROL-XL 50 mg daily. (Take with 134m tablet to equal 1571mtotal) What changed: Another medication with the same name was removed. Continue taking this medication, and follow the directions you see here.   nitroGLYCERIN 0.4 MG SL tablet Commonly known as: NITROSTAT Place 0.4 mg under the tongue every 5 (five) minutes as needed for chest pain.   pantoprazole 40 MG tablet Commonly known as: PROTONIX Take 1 tablet (40 mg total) by mouth daily. Replaces: omeprazole 40 MG capsule   PreserVision AREDS 2+Multi Vit Caps Take 1 capsule by mouth 2 (two) times daily.   sertraline 100 MG tablet Commonly known as: ZOLOFT Take 200 mg by mouth in the morning.   tamsulosin 0.4 MG Caps capsule Commonly known as: FLOMAX Take 0.8 mg by mouth at bedtime.   vitamin B-12 1000 MCG tablet Commonly known as: CYANOCOBALAMIN Take 1,000 mcg by mouth daily.        Contact information for follow-up providers     LuDanae OrleansMD .   Specialty: Internal Medicine Why: As needed Contact information: [address] MeUniversity Of Maryland Medicine Asc LLCrSour [id number] up.   Why: A scheduler from our office will call you to arrange a date/time for your thyroid biopsy. Please call our office with any questions prior to your appointment. Contact information: [phone]            Contact information for after-discharge care     [facility] .   Service: Skilled Nursing Contact information: [id number]                   Allergies  Allergen Reactions   Sulfamethoxazole-Trimethoprim Other (See Comments)    dizziness dizziness dizziness    Tape Rash    blisters    Consultations: IR  Procedures/Studies: CT Abdomen Pelvis Wo Contrast  Result Date: 05/06/2021 CLINICAL DATA:  [age]ear old male with acute onset vomiting. EXAM: CT ABDOMEN AND PELVIS WITHOUT CONTRAST TECHNIQUE: Multidetector CT imaging of the abdomen and pelvis was performed following the standard protocol without IV contrast. COMPARISON:  CT Abdomen and Pelvis 02/12/2020, and earlier. Chest CT 08/27/2019. FINDINGS: Lower chest: Chronic nodular lung disease, with frequent bulky calcified pulmonary nodules, and chronic coarse pleural calcifications in both costophrenic angles. Superimposed small new layering right pleural effusion, and trace effusion on the left. No cardiomegaly. No pericardial effusion. Hepatobiliary: Negative noncontrast liver. Possible gallbladder layering sludge on series 2, image 33. But no gallbladder distension or pericholecystic inflammation. Pancreas: Chronic dystrophic calcifications throughout the pancreatic head and  uncinate appears stable since last year. No pancreatic inflammation is evident. Spleen: Chronic splenomegaly. Estimated splenic volume is 840 mL (normal splenic volume range 83 - 412 mL). But this has only mildly increased since 2017. No discrete splenic lesion. No perisplenic fluid. Adrenals/Urinary Tract: Adrenal glands are stable and negative. Right kidney appears nonobstructed and stable since 2017. No right hydroureter. Bulky 11 mm left lower pole calculus. Chronic asymmetric left renal pelvis appears stable since 2017. No left hydroureter. No acute pararenal or periureteral inflammation. Chronic mildly thick-walled urinary bladder with an anterior small bladder diverticulum unchanged on series 2, image 75. Chronic pelvic phleboliths. Stomach/Bowel: Negative large bowel aside from redundancy and  occasional diverticula. The cecum is located on a lax mesentery in the anterior right upper quadrant. Normal appendix on series 2, image 48. Negative terminal ileum. No dilated small bowel. Gas containing but otherwise negative stomach. Fluid in the duodenum. Small distal duodenal diverticulum without active inflammation on series 2, image 36. No free air or free fluid. Vascular/Lymphatic: Aortoiliac calcified atherosclerosis. Normal caliber abdominal aorta. Vascular patency is not evaluated in the absence of IV contrast. There are chronic, matted aortocaval lymph nodes in the lower retroperitoneum (series 2, image 49) which are stable since 2017. No other lymphadenopathy is evident on this noncontrast exam. Reproductive: Chronic prostatomegaly and small fat containing inguinal hernias. Other: No pelvic free fluid. Musculoskeletal: Chronic posterior left lower rib fractures. Chronic L1 compression fracture is stable since last year. No acute osseous abnormality identified. IMPRESSION: 1. A small layering right pleural effusion is new since last year. But otherwise visible extensive nodular, partially calcified chronic lung and pleural disease is stable. 2. No acute or inflammatory process identified in the non-contrast abdomen or pelvis. 3. Bulky left lower pole renal calculus is stable with no convincing obstructive uropathy. Chronic splenomegaly is nonspecific and only mildly progressed since 2017. And superimposed mild chronic retroperitoneal lymphadenopathy is stable since 2017. Chronic bladder wall thickening and small bladder diverticulum. Chronic prostatomegaly. Aortic Atherosclerosis (ICD10-I70.0). Electronically Signed   By: Genevie Ann M.D.   On: 05/06/2021 05:49   DG Chest 2 View  Result Date: 05/05/2021 CLINICAL DATA:  Status post fall. EXAM: CHEST - 2 VIEW COMPARISON:  August 30, 2020 FINDINGS: Multiple sternal wires are seen. Bilateral chronic appearing lung markings are noted with multiple calcified  lung nodules of various sizes seen bilaterally. There is no evidence of acute infiltrate, pleural effusion or pneumothorax. The cardiac silhouette is within normal limits. The visualized skeletal structures are unremarkable. IMPRESSION: 1. Evidence of prior median sternotomy with findings consistent with history of sarcoidosis. 2. No acute or active cardiopulmonary disease. Electronically Signed   By: Virgina Norfolk M.D.   On: 05/05/2021 21:31   CT HEAD WO CONTRAST (5MM)  Result Date: 05/05/2021 CLINICAL DATA:  Recent fall with headaches and neck pain, initial encounter EXAM: CT HEAD WITHOUT CONTRAST CT CERVICAL SPINE WITHOUT CONTRAST TECHNIQUE: Multidetector CT imaging of the head and cervical spine was performed following the standard protocol without intravenous contrast. Multiplanar CT image reconstructions of the cervical spine were also generated. COMPARISON:  08/30/2020, CT of the head from earlier in the same day. FINDINGS: CT HEAD FINDINGS Brain: The area of increased attenuation adjacent to the left frontal cortex is no longer identified and felt to be artifactual in nature likely related to mild motion and beam hardening artifact. Mild atrophic changes are seen. No findings to suggest acute hemorrhage, acute infarction or space-occupying mass lesion is noted. Vascular: No  hyperdense vessel or unexpected calcification. Skull: Normal. Negative for fracture or focal lesion. Sinuses/Orbits: No acute finding. Other: None. CT CERVICAL SPINE FINDINGS Alignment: Mild straightening of the normal cervical lordosis is noted. Skull base and vertebrae: 7 cervical segments are well visualized. Vertebral body height is well maintained. Mild osteophytic changes are noted from C4-C7. Mild anterolisthesis of C4 on C5 is seen. Facet hypertrophic changes are noted bilaterally. No acute fracture or acute facet abnormality is seen. Soft tissues and spinal canal: Surrounding soft tissue structures are within normal  limits with the exception of a hypodense nodule within the right lobe of the thyroid measuring 19 mm. This is stable in appearance from the prior CT. Upper chest: Visualized lung apices demonstrate biapical scarring. Right-sided pleural effusion is noted new from the prior exam. Chest x-ray is recommended for further evaluation. Other: None IMPRESSION: CT of the head: No acute intracranial abnormality noted. The previously seen area of suggested hemorrhage is felt to be artifactual in nature related to motion artifact. CT of the cervical spine: Multi level degenerative changes similar to that seen on prior exams. 1.9 cm right thyroid nodule. Recommend nonemergent thyroid US (ref: J Am Coll Radiol. 2015 Feb;12(2): 143-50). New right-sided pleural effusion. Chest x-ray is recommended for further evaluation. Electronically Signed   By: Inez Catalina M.D.   On: 05/05/2021 20:16   CT Head Wo Contrast  Result Date: 05/05/2021 CLINICAL DATA:  Trauma, fall EXAM: CT HEAD WITHOUT CONTRAST TECHNIQUE: Contiguous axial images were obtained from the base of the skull through the vertex without intravenous contrast. COMPARISON:  08/30/2020 FINDINGS: Brain: Motion artifacts are seen in the some of the images. Additional images were obtained.rrw in image 19 of series 3, there is 8 x 4 mm area of subtle increased density in the left frontal cortex. Area of this finding is not included in the repeated images. There is no significant focal mass effect. There is no demonstrable epidural or subdural fluid collection. Ventricles are prominent. There is no shift of midline structures. Cortical sulci are prominent. There is subtle decreased density in the periventricular white matter. Vascular: Unremarkable. Skull: No fracture is seen in calvarium. Sinuses/Orbits: Unremarkable. Other: None IMPRESSION: There is 8 mm subtle increased density in the left frontal cortex close to the inner table of calvarium. This may suggest small focus of  petechial hemorrhage due to contusion. In view of motion artifacts, possibility an artifact is not excluded. Short-term follow-up CT or MRI in 2-3 hours may be considered. Atrophy. Small-vessel disease. Electronically Signed   By: Elmer Picker M.D.   On: 05/05/2021 16:45   CT Cervical Spine Wo Contrast  Result Date: 05/05/2021 CLINICAL DATA:  Recent fall with headaches and neck pain, initial encounter EXAM: CT HEAD WITHOUT CONTRAST CT CERVICAL SPINE WITHOUT CONTRAST TECHNIQUE: Multidetector CT imaging of the head and cervical spine was performed following the standard protocol without intravenous contrast. Multiplanar CT image reconstructions of the cervical spine were also generated. COMPARISON:  08/30/2020, CT of the head from earlier in the same day. FINDINGS: CT HEAD FINDINGS Brain: The area of increased attenuation adjacent to the left frontal cortex is no longer identified and felt to be artifactual in nature likely related to mild motion and beam hardening artifact. Mild atrophic changes are seen. No findings to suggest acute hemorrhage, acute infarction or space-occupying mass lesion is noted. Vascular: No hyperdense vessel or unexpected calcification. Skull: Normal. Negative for fracture or focal lesion. Sinuses/Orbits: No acute finding. Other: None. CT CERVICAL  SPINE FINDINGS Alignment: Mild straightening of the normal cervical lordosis is noted. Skull base and vertebrae: 7 cervical segments are well visualized. Vertebral body height is well maintained. Mild osteophytic changes are noted from C4-C7. Mild anterolisthesis of C4 on C5 is seen. Facet hypertrophic changes are noted bilaterally. No acute fracture or acute facet abnormality is seen. Soft tissues and spinal canal: Surrounding soft tissue structures are within normal limits with the exception of a hypodense nodule within the right lobe of the thyroid measuring 19 mm. This is stable in appearance from the prior CT. Upper chest:  Visualized lung apices demonstrate biapical scarring. Right-sided pleural effusion is noted new from the prior exam. Chest x-ray is recommended for further evaluation. Other: None IMPRESSION: CT of the head: No acute intracranial abnormality noted. The previously seen area of suggested hemorrhage is felt to be artifactual in nature related to motion artifact. CT of the cervical spine: Multi level degenerative changes similar to that seen on prior exams. 1.9 cm right thyroid nodule. Recommend nonemergent thyroid US (ref: J Am Coll Radiol. 2015 Feb;12(2): 143-50). New right-sided pleural effusion. Chest x-ray is recommended for further evaluation. Electronically Signed   By: Inez Catalina M.D.   On: 05/05/2021 20:16   DG Chest Port 1 View  Result Date: 05/06/2021 CLINICAL DATA:  Shortness of breath EXAM: PORTABLE CHEST 1 VIEW COMPARISON:  05/05/2021 FINDINGS: 0430 hours. Interval progression of patchy bilateral airspace disease on a background of chronic interstitial change. Calcified lymph nodes are again noted in the mediastinum and hilar regions with calcified pleural/parenchymal lung nodules best appreciated at the bases. The cardiopericardial silhouette is within normal limits for size. Telemetry leads overlie the chest. IMPRESSION: Interval progression of patchy bilateral airspace disease suggesting pulmonary edema although diffuse infection cannot be excluded. Electronically Signed   By: Misty Stanley M.D.   On: 05/06/2021 04:57   ECHOCARDIOGRAM COMPLETE  Result Date: 05/09/2021    ECHOCARDIOGRAM REPORT   Patient Name:   DUTCH ING Date of Exam: 05/09/2021 Medical Rec #:  [id number]     Height:       66.0 in Accession #:    [id number]    Weight:       178.4 lb Date of Birth:  March 02, 1941     BSA:          1.905 m Patient Age:    31 years      BP:           142/80 mmHg Patient Gender: M             HR:           78 bpm. Exam Location:  [facility] Procedure: 2D Echo, Cardiac Doppler and Color Doppler  Indications:     CHF-acute diastolic B51.02  History:         Patient has prior history of Echocardiogram examinations, most                  recent 05/21/2018. CAD and Previous Myocardial Infarction,                  Signs/Symptoms:Murmur; Risk Factors:Diabetes and Hypertension.                  CKD.  Sonographer:     Sherrie Sport Referring Phys:  [id number] SENDIL K [facility] Diagnosing Phys: Kathlyn Sacramento MD IMPRESSIONS  1. Left ventricular ejection fraction, by estimation, is 55 to 60%. The left ventricle has normal function. Left ventricular  endocardial border not optimally defined to evaluate regional wall motion. There is moderate left ventricular hypertrophy. Left ventricular diastolic parameters are consistent with Grade II diastolic dysfunction (pseudonormalization).  2. Right ventricular systolic function is normal. The right ventricular size is normal. There is mildly elevated pulmonary artery systolic pressure.  3. Left atrial size was mildly dilated.  4. Right atrial size was mildly dilated.  5. The mitral valve is normal in structure. No evidence of mitral valve regurgitation. No evidence of mitral stenosis.  6. The aortic valve is normal in structure. Aortic valve regurgitation is not visualized. Aortic valve sclerosis/calcification is present, without any evidence of aortic stenosis.  7. The inferior vena cava is normal in size with greater than 50% respiratory variability, suggesting right atrial pressure of 3 mmHg. FINDINGS  Left Ventricle: Left ventricular ejection fraction, by estimation, is 55 to 60%. The left ventricle has normal function. Left ventricular endocardial border not optimally defined to evaluate regional wall motion. The left ventricular internal cavity size was normal in size. There is moderate left ventricular hypertrophy. Left ventricular diastolic parameters are consistent with Grade II diastolic dysfunction (pseudonormalization). Right Ventricle: The right ventricular size is normal.  No increase in right ventricular wall thickness. Right ventricular systolic function is normal. There is mildly elevated pulmonary artery systolic pressure. The tricuspid regurgitant velocity is 2.78  m/s, and with an assumed right atrial pressure of 5 mmHg, the estimated right ventricular systolic pressure is 42.3 mmHg. Left Atrium: Left atrial size was mildly dilated. Right Atrium: Right atrial size was mildly dilated. Pericardium: There is no evidence of pericardial effusion. Mitral Valve: The mitral valve is normal in structure. No evidence of mitral valve regurgitation. No evidence of mitral valve stenosis. MV peak gradient, 5.3 mmHg. The mean mitral valve gradient is 2.0 mmHg. Tricuspid Valve: The tricuspid valve is normal in structure. Tricuspid valve regurgitation is mild . No evidence of tricuspid stenosis. Aortic Valve: The aortic valve is normal in structure. Aortic valve regurgitation is not visualized. Aortic valve sclerosis/calcification is present, without any evidence of aortic stenosis. Aortic valve mean gradient measures 3.5 mmHg. Aortic valve peak  gradient measures 5.0 mmHg. Aortic valve area, by VTI measures 2.84 cm. Pulmonic Valve: The pulmonic valve was normal in structure. Pulmonic valve regurgitation is not visualized. No evidence of pulmonic stenosis. Aorta: The aortic root is normal in size and structure. Venous: The inferior vena cava is normal in size with greater than 50% respiratory variability, suggesting right atrial pressure of 3 mmHg. IAS/Shunts: No atrial level shunt detected by color flow Doppler.  LEFT VENTRICLE PLAX 2D LVIDd:         4.30 cm   Diastology LVIDs:         3.00 cm   LV e' medial:    4.68 cm/s LV PW:         1.60 cm   LV E/e' medial:  20.9 LV IVS:        1.80 cm   LV e' lateral:   7.72 cm/s LVOT diam:     2.10 cm   LV E/e' lateral: 12.7 LV SV:         63 LV SV Index:   33 LVOT Area:     3.46 cm  LEFT ATRIUM           Index        RIGHT ATRIUM           Index LA  diam:  3.80 cm 1.99 cm/m   RA Area:     20.40 cm LA Vol (A2C): 55.1 ml 28.93 ml/m  RA Volume:   65.50 ml  34.39 ml/m LA Vol (A4C): 25.9 ml 13.60 ml/m  AORTIC VALVE                    PULMONIC VALVE AV Area (Vmax):    2.74 cm     PV Vmax:        1.11 m/s AV Area (Vmean):   2.25 cm     PV Vmean:       70.400 cm/s AV Area (VTI):     2.84 cm     PV VTI:         0.182 m AV Vmax:           111.30 cm/s  PV Peak grad:   4.9 mmHg AV Vmean:          88.200 cm/s  PV Mean grad:   2.0 mmHg AV VTI:            0.223 m      RVOT Peak grad: 4 mmHg AV Peak Grad:      5.0 mmHg AV Mean Grad:      3.5 mmHg LVOT Vmax:         88.10 cm/s LVOT Vmean:        57.400 cm/s LVOT VTI:          0.183 m LVOT/AV VTI ratio: 0.82  AORTA Ao Root diam: 3.67 cm MITRAL VALVE               TRICUSPID VALVE MV Area (PHT): 5.16 cm    TR Peak grad:   30.9 mmHg MV Area VTI:   2.56 cm    TR Vmax:        278.00 cm/s MV Peak grad:  5.3 mmHg MV Mean grad:  2.0 mmHg    SHUNTS MV Vmax:       1.15 m/s    Systemic VTI:  0.18 m MV Vmean:      64.1 cm/s   Systemic Diam: 2.10 cm MV Decel Time: 147 msec    Pulmonic VTI:  0.169 m MV E velocity: 97.70 cm/s MV A velocity: 78.00 cm/s MV E/A ratio:  1.25 Kathlyn Sacramento MD Electronically signed by Kathlyn Sacramento MD Signature Date/Time: 05/09/2021/2:35:00 PM    Final    US THYROID  Result Date: 05/16/2021 CLINICAL DATA:  Thyroid nodule by CT EXAM: THYROID ULTRASOUND TECHNIQUE: Ultrasound examination of the thyroid gland and adjacent soft tissues was performed. COMPARISON:  05/05/2021 cervical spine CT FINDINGS: Parenchymal Echotexture: Mildly heterogenous Isthmus: 2.2 mm Right lobe: 4.8 x 2.4 x 2.1 cm Left lobe: 3.7 x 2.0 x 1.8 cm _________________________________________________________ Estimated total number of nodules >/= 1 cm: 1 Number of spongiform nodules >/=  2 cm not described below (TR1): 0 Number of mixed cystic and solid nodules >/= 1.5 cm not described below (TR2): 0  _________________________________________________________ Nodule # 1: Location: Right; Mid Maximum size: 2.5 cm; Other 2 dimensions: 1.8 x 1.6 cm Composition: solid/almost completely solid (2) Echogenicity: isoechoic (1) Shape: not taller-than-wide (0) Margins: ill-defined (0) Echogenic foci: none (0) ACR TI-RADS total points: 3. ACR TI-RADS risk category: TR3 (3 points). ACR TI-RADS recommendations: **Given size (>/= 2.5 cm) and appearance, fine needle aspiration of this mildly suspicious nodule should be considered based on TI-RADS criteria. _________________________________________________________ Left lower pole dystrophic calcification measures 3 mm. No hypervascularity.  No regional adenopathy. IMPRESSION: 2.5 cm right mid thyroid TR 3 nodule meets criteria for biopsy as above. This correlates with the CT finding. The above is in keeping with the ACR TI-RADS recommendations - J Am Coll Radiol 2017;14:587-595. Electronically Signed   By: Jerilynn Mages.  Shick M.D.   On: 05/16/2021 10:47    Subjective: Patient was seen and examined today.  No new complaints.  Ready to go to rehab and hoping to return home soon.  Discharge Exam: Vitals:   05/19/21 0524 05/19/21 0821  BP: (!) 147/78 116/64  Pulse: 63 67  Resp: 16 18  Temp: 98.1 F (36.7 C) 98 F (36.7 C)  SpO2: 96% 98%   General.  Pleasant elderly gentleman, in no acute distress. Pulmonary.  Lungs clear bilaterally, normal respiratory effort. CV.  Regular rate and rhythm, no JVD, rub or murmur. Abdomen.  Soft, nontender, nondistended, BS positive. CNS.  Alert and oriented .  No focal neurologic deficit. Extremities.  No edema, no cyanosis, pulses intact and symmetrical. Psychiatry.  Judgment and insight appears normal.  Labs: Basic Metabolic Panel: No results for input(s): NA, K, CL, CO2, GLUCOSE, BUN, CREATININE, CALCIUM, MG, PHOS in the last 168 hours.   Time coordinating discharge: Over 30 minutes  Lorella Nimrod, MD  [facility] 05/19/2021, 10:26 AM Pager   If 7PM-7AM, please contact night-coverage [email or web address] Password [email or web address]

## 2021-05-19 NOTE — Plan of Care (Signed)
Patient orientedx4, VSS. Fall/safety precautions in place, rounding performed. Turned self in bed. Ambulated to bathroom with x1 assistance and walker, tends to lean backwards while walking leading to unsteady gait.   Problem: Education: Goal: Knowledge of General Education information will improve Description: Including pain rating scale, medication(s)/side effects and non-pharmacologic comfort measures Outcome: Progressing   Problem: Health Behavior/Discharge Planning: Goal: Ability to manage health-related needs will improve Outcome: Progressing   Problem: Clinical Measurements: Goal: Ability to maintain clinical measurements within normal limits will improve Outcome: Progressing Goal: Will remain free from infection Outcome: Progressing Goal: Cardiovascular complication will be avoided Outcome: Progressing   Problem: Pain Managment: Goal: General experience of comfort will improve Outcome: Progressing   Problem: Safety: Goal: Ability to remain free from injury will improve Outcome: Progressing   Problem: Education: Goal: Knowledge of risk factors and measures for prevention of condition will improve Outcome: Progressing   Problem: Respiratory: Goal: Complications related to the disease process, condition or treatment will be avoided or minimized Outcome: Progressing

## 2021-05-20 DIAGNOSIS — J9601 Acute respiratory failure with hypoxia: Secondary | ICD-10-CM | POA: Diagnosis not present

## 2021-05-20 LAB — GLUCOSE, CAPILLARY
Glucose-Capillary: 103 mg/dL — ABNORMAL HIGH (ref 70–99)
Glucose-Capillary: 109 mg/dL — ABNORMAL HIGH (ref 70–99)
Glucose-Capillary: 162 mg/dL — ABNORMAL HIGH (ref 70–99)
Glucose-Capillary: 135 mg/dL — ABNORMAL HIGH (ref 70–99)
Glucose-Capillary: 224 mg/dL — ABNORMAL HIGH (ref 70–99)

## 2021-05-20 NOTE — TOC Progression Note (Signed)
Transition of Care [facility]) - Progression Note    Patient Details  Name: Peter Robertson MRN: [id number] Date of Birth: [birth year]  Transition of [facility]) CM/SW Contact  Shelbie Hutching, RN Phone Number: 05/20/2021, 3:15 PM  Clinical Narrative:    No word from [facility] on appeal.  [place] does not admit over the weekend so patient will have to wait until Monday if auth approved.  [name] patient's friend is worried that patient will not stay that long because he really wants to go home.     Expected Discharge Plan: [place] Barriers to Discharge: Barriers Resolved  Expected Discharge Plan and Services Expected Discharge Plan: [place] Choice: [place] Living arrangements for the past 2 months: Single Family Home Expected Discharge Date: 05/19/21               DME Arranged: N/A DME Agency: NA       HH Arranged: NA HH Agency: NA         Social Determinants of Health (SDOH) Interventions    Readmission Risk Interventions No flowsheet data found.

## 2021-05-20 NOTE — TOC Progression Note (Signed)
Transition of Care [facility]) - Progression Note    Patient Details  Name: Peter Robertson MRN: [id number] Date of Birth: 03-Nov-1940  Transition of Care [facility]) CM/SW Contact  Shelbie Hutching, RN Phone Number: 05/20/2021, 8:52 AM  Clinical Narrative:    Patient unable to go to [place] yesterday due to the authorization status still pending on the [employer] portal.  [employer] did call and say that it was approved but they did not provide any information on number of days approved and when the next review would be so [facility] can not accept until that has been updated.    Expected Discharge Plan: [place] Barriers to Discharge: Barriers Resolved  Expected Discharge Plan and Services Expected Discharge Plan: [place] Choice: [place] Living arrangements for the past 2 months: Single Family Home Expected Discharge Date: 05/19/21               DME Arranged: N/A DME Agency: NA       HH Arranged: NA HH Agency: NA         Social Determinants of Health (SDOH) Interventions    Readmission Risk Interventions No flowsheet data found.

## 2021-05-20 NOTE — Progress Notes (Signed)
PROGRESS NOTE  Peter Robertson    DOB: 08-16-1940, 80 y.o.  ONG:[id number]  PCP: Danae Orleans, MD   Code Status: Full Code   DOA: 05/05/2021   LOS: 48  Brief Narrative of Current Hospitalization  Peter Robertson is a 80 y.o. male with a PMH significant for HTN, Type 2 DM, CKDIIIa, HFpEF, CAD, dementia. They presented from home to the ED on 05/05/2021 with fall and head injury.  In the ED, it was found that they had acute respiratory distress with COVID and fluid overload. They were treated with oxygen, BiPAP, steroids, breathing treatments, remdesivir.  Patient was admitted to medicine service for further workup and management of acute hypoxic respiratory failure in setting of COVID as outlined in detail below.  Patient was discharged yesterday but insurance authorization was denied.  Appeal filed.  Patient wants to go home but unsafe discharge as he lives alone.  Also found to have a thyroid nodule which meets criteria for biopsy.  Patient was again discharged yesterday when received a verbal approval from insurance.  Apparently facility has not received the formal approval yet.  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  Completed remdesivir.  Patient able  to discharge to SNF once COVID-19 isolation precautions met by SNF.  Patient strongly wishes to go home. Off airborn precautions now.  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 -PT/OT to continue evaluation for improvement and treatments to increase strength -Mucinex as needed -Combivent twice daily  Type II DM-hemoglobin A1c 7.1 on admission which is below goal of 8 for age.  Blood sugars well controlled, were elevated early on admission due to steroid administration. -Changing insulin administration to a.m. dosing for better safety profile  -30 units Semglee a.m. -discontinued sliding scale  CKD IIIb-chronic, stable -Avoid nephrotoxic agents -BMP a.m.  Dementia without behavioral disturbance  depression -Continue home sertraline -Delirium precautions  HLD-chronic, not on home medications  HTN-chronic, moderately well controlled -Continue home metoprolol  Fall at home, initial encounter -PT/OT  Thyroid nodule Thyroid ultrasound with a 2.5 cm right mid thyroid TR 3 nodule meets criteria for biopsy.  BPH -Continue home Flomax  DVT prophylaxis: enoxaparin (LOVENOX) injection 30 mg Start: 05/13/21 2200 SCDs Start: 05/06/21 0739   Diet:  Diet Orders (From admission, onward)     Start     Ordered   05/19/21 0000  Diet - low sodium heart healthy        05/19/21 1025   05/06/21 0727  Diet heart healthy/carb modified Room service appropriate? Yes; Fluid consistency: Thin  Diet effective now       Question Answer Comment  Diet-HS Snack? Nothing   Room service appropriate? Yes   Fluid  consistency: Thin      05/06/21 0726            Subjective 05/20/21   Patient was seen and examined today.  No new complaints.  He wants his home clothes, stating that he is not comfortable in this hospital gown.  Disposition Plan & Communication  Patient status: Inpatient  Admitted From: Home Disposition: Skilled nursing  facility Anticipated discharge date: Medically stable, pending appeal for insurance authorization. Family Communication: None Consults, Procedures, Significant Events  Consultants:  None  Procedures/significant events:  Thyroid US 11/28  Antimicrobials:  Anti-infectives (From admission, onward)    Start     Dose/Rate Route Frequency Ordered Stop   05/07/21 1000  remdesivir 100 mg in sodium chloride 0.9 % 100 mL IVPB       See Hyperspace for full Linked Orders Report.   100 mg 200 mL/hr over 30 Minutes Intravenous Daily 05/06/21 0649 05/10/21 1000   05/06/21 0700  remdesivir 200 mg in sodium chloride 0.9% 250 mL IVPB       See Hyperspace for full Linked Orders Report.   200 mg 580 mL/hr over 30 Minutes Intravenous Once 05/06/21 0649 05/06/21 1009   05/06/21 0515  Ampicillin-Sulbactam (UNASYN) 3 g in sodium chloride 0.9 % 100 mL IVPB        3 g 200 mL/hr over 30 Minutes Intravenous  Once 05/06/21 0512 05/06/21 0603       Objective   Vitals:   05/20/21 0522 05/20/21 0835 05/20/21 0910 05/20/21 1127  BP: 124/70 112/63 118/62 (!) 117/56  Pulse: 66 60 62 64  Resp: 16 18 18 20   Temp: 97.6 F (36.4 C) (!) 97.4 F (36.3 C) 98 F (36.7 C) (!) 97.5 F (36.4 C)  TempSrc: Oral Oral Oral Oral  SpO2: 99% 100% 98% 100%  Weight:      Height:        Intake/Output Summary (Last 24 hours) at 05/20/2021 1534 Last data filed at 05/20/2021 0700 Gross per 24 hour  Intake 236 ml  Output 250 ml  Net -14 ml    Filed Weights   05/05/21 1528 05/06/21 2245 05/11/21 0511  Weight: 82.6 kg 80.9 kg 85 kg    Patient BMI: Body mass index is 30.25 kg/m.   Physical Exam: General.  Well-developed elderly man, in no acute distress. Pulmonary.  Lungs clear bilaterally, normal respiratory effort. CV.  Regular rate and rhythm, no JVD, rub or murmur. Abdomen.  Soft, nontender, nondistended, BS positive. CNS.  Alert and oriented .  No focal neurologic deficit. Extremities.  No edema, no cyanosis,  pulses intact and symmetrical. Psychiatry.  Judgment and insight appears normal.   Labs   I have personally reviewed following labs and imaging studies CBC    Component Value Date/Time   WBC 6.7 05/08/2021 0436   RBC 4.30 05/08/2021 0436   HGB 12.0 (L) 05/08/2021 0436   HGB 15.1 09/29/2013 0350   HCT 35.9 (L) 05/08/2021 0436   HCT 42.9 09/29/2013 0350   PLT 100 (L) 05/08/2021 0436   PLT 127 (L) 09/29/2013 0350   MCV 83.5 05/08/2021 0436   MCV 83 09/29/2013 0350   MCH 27.9 05/08/2021 0436   MCHC 33.4 05/08/2021 0436   RDW 14.7 05/08/2021 0436   RDW 15.2 (H) 09/29/2013 0350   LYMPHSABS 0.3 (L) 05/07/2021 0631   LYMPHSABS 1.0 09/29/2013 0350   MONOABS 0.5 05/07/2021 0631   MONOABS 0.8 09/29/2013 0350   EOSABS 0.0 05/07/2021 0631  EOSABS 0.2 09/29/2013 0350   BASOSABS 0.0 05/07/2021 0631   BASOSABS 0.1 09/29/2013 0350   Imaging Studies  No results found. Medications   Scheduled Meds:  atorvastatin  40 mg Oral QAC supper   cholecalciferol  1,000 Units Oral Daily   enoxaparin (LOVENOX) injection  30 mg Subcutaneous Q24H   gabapentin  100 mg Oral QHS   insulin aspart  0-15 Units Subcutaneous TID WC   insulin aspart  0-5 Units Subcutaneous QHS   insulin glargine-yfgn  30 Units Subcutaneous Daily   Ipratropium-Albuterol  2 puff Inhalation BID   metoprolol succinate  50 mg Oral Daily   multivitamin-lutein  1 capsule Oral BID   pantoprazole  40 mg Oral Daily   sertraline  200 mg Oral q AM   tamsulosin  0.8 mg Oral QHS   vitamin B-12  1,000 mcg Oral Daily   No recently discontinued medications to reconcile  LOS: 14 days   Time spent: 25 min  Lorella Nimrod, MD [facility] 05/20/2021, 3:34 PM   Please refer to amion to contact the [facility] Attending or Consulting provider for this pt  [email or web address] Available by Epic secure chat 7AM-7PM. If 7PM-7AM, please contact night-coverage

## 2021-05-21 DIAGNOSIS — J9601 Acute respiratory failure with hypoxia: Secondary | ICD-10-CM | POA: Diagnosis not present

## 2021-05-21 LAB — GLUCOSE, CAPILLARY
Glucose-Capillary: 101 mg/dL — ABNORMAL HIGH (ref 70–99)
Glucose-Capillary: 132 mg/dL — ABNORMAL HIGH (ref 70–99)
Glucose-Capillary: 132 mg/dL — ABNORMAL HIGH (ref 70–99)
Glucose-Capillary: 173 mg/dL — ABNORMAL HIGH (ref 70–99)

## 2021-05-21 NOTE — Progress Notes (Signed)
Physical Therapy Treatment Patient Details Name: Peter Robertson MRN: [id number] DOB: 10-Feb-1941 Today's Date: 05/21/2021   History of Present Illness Pt admitted for ARF secondary to covid with complaints of falls and SOB symptoms. History includes HTN, HLD, DM, GERD, and MI.    PT Comments    Split session this am to allow rest and L knee pain relief. He is able to walk x 1 lap, rest then about 90 minutes later complete a second lap.  L knee pain is primary complaint.  Overall balance is improved with less post knee.   SNF remains appropriate as balance remains impaired and increased fall risk with history.  If insurance continues to decline SNF, increased assist and supervision at home would be recommended along with HHPT.   Recommendations for follow up therapy are one component of a multi-disciplinary discharge planning process, led by the attending physician.  Recommendations may be updated based on patient status, additional functional criteria and insurance authorization.  Follow Up Recommendations  Skilled nursing-short term rehab (<3 hours/day)     Assistance Recommended at Discharge    Equipment Recommendations  Rolling walker (2 wheels)    Recommendations for Other Services       Precautions / Restrictions Precautions Precautions: Fall Precaution Comments: no longer on covid precautions, 10 days up Restrictions Weight Bearing Restrictions: No     Mobility  Bed Mobility Overal bed mobility: Modified Independent                  Transfers Overall transfer level: Needs assistance Equipment used: Rolling walker (2 wheels) Transfers: Sit to/from Stand Sit to Stand: Min guard                Ambulation/Gait Ambulation/Gait assistance: Min guard Gait Distance (Feet): 200 Feet Assistive device: Rolling walker (2 wheels) Gait Pattern/deviations: Step-through pattern;Decreased step length - right;Decreased step length - left;Drifts right/left;Decreased  stride length;Wide base of support;Trunk flexed Gait velocity: decreased     General Gait Details: 2 laps with split session today   Rober[place]on             Wheelchair Mobility    Modified Rankin (Stroke Patients Only)       Balance Overall balance assessment: History of Falls;Needs assistance Sitting-balance support: No upper extremity supported;Feet supported Sitting balance-Leahy Scale: Good     Standing balance support: Bilateral upper extremity supported;During functional activity Standing balance-Leahy Scale: Fair Standing balance comment: some lateral drifting to the right while ambulating with RW                            Cognition Arousal/Alertness: Awake/alert Behavior During Therapy: WFL for tasks assessed/performed Overall Cognitive Status: Within Functional Limits for tasks assessed                                          Exercises      General Comments        Pertinent Vitals/Pain Pain Assessment: Faces Faces Pain Scale: Hurts little more Pain Location: L knee stiffness Pain Descriptors / Indicators: Tightness;Sore Pain Intervention(s): Limited activity within patient's tolerance;Monitored during session;Repositioned    Home Living                          Prior Function  PT Goals (current goals can now be found in the care plan section) Progress towards PT goals: Progressing toward goals    Frequency    Min 2X/week      PT Plan Current plan remains appropriate    Co-evaluation              AM-PAC PT "6 Clicks" Mobility   Outcome Measure  Help needed turning from your back to your side while in a flat bed without using bedrails?: A Little Help needed moving from lying on your back to sitting on the side of a flat bed without using bedrails?: A Little Help needed moving to and from a bed to a chair (including a wheelchair)?: A Little Help needed standing up from a chair  using your arms (e.g., wheelchair or bedside chair)?: A Little Help needed to walk in hospital room?: A Little Help needed climbing 3-5 steps with a railing? : A Lot 6 Click Score: 17    End of Session Equipment Utilized During Treatment: Gait belt Activity Tolerance: Patient tolerated treatment well Patient left: with call bell/phone within reach;in bed;with bed alarm set Nurse Communication: Mobility status PT Visit Diagnosis: Muscle weakness (generalized) (M62.81);Difficulty in walking, not elsewhere classified (R26.2);Unsteadiness on feet (R26.81)     Time: 6808-8110 PT Time Calculation (min) (ACUTE ONLY): 26 min  Charges:  $Gait Training: 23-37 mins                    Chesley Noon, PTA 05/21/21, 11:14 AM

## 2021-05-21 NOTE — Evaluation (Signed)
Occupational Therapy re-Evaluation Patient Details Name: Peter Robertson MRN: [id number] DOB: [birth year] Today's Date: 05/21/2021   History of Present Illness Pt admitted for ARF secondary to covid with complaints of falls and SOB symptoms. History includes HTN, HLD, DM, GERD, and MI.   Clinical Impression   Pt seen for OT re-evaluation this date in setting of prolonged hospitalization d/t COVID. Pt presents this date with some lingering weakness and balance deficits including drifting to the R with fxl mobility, but is improving with fxl activity tolerance. He is able to come to sitting with MOD I and demos G sitting balance. He requires CGA for initial sit to stand, but with cues for safety, upon returning to EOB, he is able to control descent and sit with SUPV only. He completes fxl mobility around nursing station with minimal cues to reduce drifting to the R. OT engages pt in standing self care simulation with SUPV for static standing tasks. Do continue to anticipate that pt could not safely complete dynamic balance tasks/home IADLs d/t balance deficits, but if increased support/resources are in place for IADLs, anticipate he can return home with [place].      Recommendations for follow up therapy are one component of a multi-disciplinary discharge planning process, led by the attending physician.  Recommendations may be updated based on patient status, additional functional criteria and insurance authorization.   Follow Up Recommendations  Home health OT    Assistance Recommended at Discharge Frequent or constant Supervision/Assistance  Functional Status Assessment  Patient has had a recent decline in their functional status and demonstrates the ability to make significant improvements in function in a reasonable and predictable amount of time.  Equipment Recommendations  None recommended by OT    Recommendations for Other Services       Precautions / Restrictions Precautions Precautions:  Fall Precaution Comments: no longer on covid precautions Restrictions Weight Bearing Restrictions: No      Mobility Bed Mobility Overal bed mobility: Modified Independent Bed Mobility: Sit to Supine;Supine to Sit     Supine to sit: Modified independent (Device/Increase time) Sit to supine: Modified independent (Device/Increase time)        Transfers Overall transfer level: Needs assistance Equipment used: Rolling walker (2 wheels) Transfers: Sit to/from Stand Sit to Stand: Min guard;Supervision           General transfer comment: CGA for sit to stand, SUPV with cues to control descent for stand to sit to EOB      Balance Overall balance assessment: History of Falls;Needs assistance Sitting-balance support: No upper extremity supported;Feet supported Sitting balance-Leahy Scale: Good     Standing balance support: Bilateral upper extremity supported;During functional activity Standing balance-Leahy Scale: Fair Standing balance comment: continues to drift to R with fxl mobility, but demos some moderate awareness of it, making corrections occasionally, but not every time                           ADL either performed or assessed with clinical judgement   ADL Overall ADL's : Needs assistance/impaired Eating/Feeding: Independent   Grooming: Supervision/safety;Standing               Lower Body Dressing: Min guard;Sit to/from stand   Toilet Transfer: Min guard;Ambulation   Toileting- Clothing Manipulation and Hygiene: Supervision/safety;Sit to/from stand       Functional mobility during ADLs: Min guard;Supervision/safety;Rolling walker (2 wheels) (around nursing station)       Vision  Baseline Vision/History: 1 Wears glasses Patient Visual Report: No change from baseline       Perception     Praxis      Pertinent Vitals/Pain Pain Assessment: Faces Faces Pain Scale: Hurts little more Pain Location: L knee stiffness Pain Descriptors /  Indicators: Tightness;Sore Pain Intervention(s): Limited activity within patient's tolerance;Monitored during session     Hand Dominance Right   Extremity/Trunk Assessment Upper Extremity Assessment Upper Extremity Assessment: Overall WFL for tasks assessed   Lower Extremity Assessment Lower Extremity Assessment: Overall WFL for tasks assessed       Communication Communication Communication: No difficulties   Cognition Arousal/Alertness: Awake/alert Behavior During Therapy: WFL for tasks assessed/performed Overall Cognitive Status: Within Functional Limits for tasks assessed                                       General Comments       Exercises Other Exercises Other Exercises: OT engages pt in fxl mobility and cues for safety wtih use of RW including controlling descent for fall prevention   Shoulder Instructions      Home Living Family/patient expects to be discharged to:: Private residence Living Arrangements: Alone Available Help at Discharge: Neighbor;Available PRN/intermittently Type of Home: House Home Access: Stairs to enter [employer] of Steps: 2   Home Layout: One level     Bathroom Shower/Tub: Teacher, early years/pre: Standard     Home Equipment: Conservation officer, nature (2 wheels);Rollator (4 wheels)          Prior Functioning/Environment Prior Level of Function : History of Falls (last six months);Independent/Modified Independent             Mobility Comments: Reports he uses rollator for ambulation ADLs Comments: Pt lives alone, manages ADLs/IADLs largely with Mod I; neighbor assists with transport, shopping        OT Problem List: Decreased strength;Impaired balance (sitting and/or standing);Decreased safety awareness;Decreased activity tolerance      OT Treatment/Interventions: Self-care/ADL training;DME and/or AE instruction;Therapeutic activities;Balance training;Therapeutic exercise;Energy  conservation;Patient/family education    OT Goals(Current goals can be found in the care plan section) Acute Rehab OT Goals Patient Stated Goal: to go home OT Goal Formulation: With patient Time For Goal Achievement: 06/04/21 Potential to Achieve Goals: Good ADL Goals Pt Will Transfer to Toilet: with modified independence;ambulating Pt Will Perform Toileting - Clothing Manipulation and hygiene: with modified independence;sit to/from stand Pt/caregiver will Perform Home Exercise Program: Increased strength;Both right and left upper extremity;With Supervision  OT Frequency: Min 2X/week   Barriers to D/C: Decreased caregiver support          Co-evaluation              AM-PAC OT "6 Clicks" Daily Activity     Outcome Measure Help from another person eating meals?: None Help from another person taking care of personal grooming?: None Help from another person toileting, which includes using toliet, bedpan, or urinal?: A Little Help from another person bathing (including washing, rinsing, drying)?: A Little Help from another person to put on and taking off regular upper body clothing?: None Help from another person to put on and taking off regular lower body clothing?: None 6 Click Score: 22   End of Session Equipment Utilized During Treatment: Rolling walker (2 wheels);Gait belt Nurse Communication: Other (comment) (notified CNA pt requesting to stay in EOB sitting to finish coffee)  Activity Tolerance: Patient tolerated treatment well Patient left: with call bell/phone within reach;with bed alarm set;Other (comment) (seated EOB to drink coffee)  OT Visit Diagnosis: Unsteadiness on feet (R26.81);History of falling (Z91.81);Muscle weakness (generalized) (M62.81)                Time: 8757-9728 OT Time Calculation (min): 11 min Charges:  OT General Charges $OT Visit: 1 Visit OT Evaluation $OT Re-eval: 1 Re-eval  Gerrianne Scale, MS, OTR/L ascom [phone] 05/21/21, 3:51 PM

## 2021-05-21 NOTE — Progress Notes (Signed)
PROGRESS NOTE  Peter Robertson    DOB: 1941/03/07, 80 y.o.  JKD:[id number]  PCP: Danae Orleans, MD   Code Status: Full Code   DOA: 05/05/2021   LOS: 49  Brief Narrative of Current Hospitalization  Peter Robertson is a 80 y.o. male with a PMH significant for HTN, Type 2 DM, CKDIIIa, HFpEF, CAD, dementia. They presented from home to the ED on 05/05/2021 with fall and head injury.  In the ED, it was found that they had acute respiratory distress with COVID and fluid overload. They were treated with oxygen, BiPAP, steroids, breathing treatments, remdesivir.  Patient was admitted to medicine service for further workup and management of acute hypoxic respiratory failure in setting of COVID as outlined in detail below.  Patient was discharged yesterday but insurance authorization was denied.  Appeal filed.  Patient wants to go home but unsafe discharge as he lives alone.  Also found to have a thyroid nodule which meets criteria for biopsy.  Patient was again discharged yesterday when received a verbal approval from insurance.  Apparently facility has not received the formal approval yet.  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  Completed remdesivir.  Patient able  to discharge to SNF once COVID-19 isolation precautions met by SNF.  Patient strongly wishes to go home. Off airborn precautions now.  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 -PT/OT to continue evaluation for improvement and treatments to increase strength -Mucinex as needed -Combivent twice daily  Type II DM-hemoglobin A1c 7.1 on admission which is below goal of 8 for age.  Blood sugars well controlled, were elevated early on admission due to steroid administration. -Changing insulin administration to a.m. dosing for better safety profile  -30 units Semglee a.m. -discontinued sliding scale  CKD IIIb-chronic, stable -Avoid nephrotoxic agents -BMP a.m.  Dementia without behavioral disturbance  depression -Continue home sertraline -Delirium precautions  HLD-chronic, not on home medications  HTN-chronic, moderately well controlled -Continue home metoprolol  Fall at home, initial encounter -PT/OT  Thyroid nodule Thyroid ultrasound with a 2.5 cm right mid thyroid TR 3 nodule meets criteria for biopsy.  BPH -Continue home Flomax  DVT prophylaxis: enoxaparin (LOVENOX) injection 30 mg Start: 05/13/21 2200 SCDs Start: 05/06/21 0739   Diet:  Diet Orders (From admission, onward)     Start     Ordered   05/19/21 0000  Diet - low sodium heart healthy        05/19/21 1025   05/06/21 0727  Diet heart healthy/carb modified Room service appropriate? Yes; Fluid consistency: Thin  Diet effective now       Question Answer Comment  Diet-HS Snack? Nothing   Room service appropriate? Yes   Fluid  consistency: Thin      05/06/21 0726            Subjective 05/21/21   Patient was seen and examined today.  No new complaints.  Appears little frustrated and wants to go home.  Disposition Plan & Communication  Patient status: Inpatient  Admitted From: Home Disposition: Skilled nursing facility Anticipated discharge date:  Medically stable, pending appeal for insurance authorization. Family Communication: None Consults, Procedures, Significant Events  Consultants:  None  Procedures/significant events:  Thyroid US 11/28  Antimicrobials:  Anti-infectives (From admission, onward)    Start     Dose/Rate Route Frequency Ordered Stop   05/07/21 1000  remdesivir 100 mg in sodium chloride 0.9 % 100 mL IVPB       See Hyperspace for full Linked Orders Report.   100 mg 200 mL/hr over 30 Minutes Intravenous Daily 05/06/21 0649 05/10/21 1000   05/06/21 0700  remdesivir 200 mg in sodium chloride 0.9% 250 mL IVPB       See Hyperspace for full Linked Orders Report.   200 mg 580 mL/hr over 30 Minutes Intravenous Once 05/06/21 0649 05/06/21 1009   05/06/21 0515  Ampicillin-Sulbactam (UNASYN) 3 g in sodium chloride 0.9 % 100 mL IVPB        3 g 200 mL/hr over 30 Minutes Intravenous  Once 05/06/21 0512 05/06/21 0603       Objective   Vitals:   05/21/21 0440 05/21/21 0746 05/21/21 1112 05/21/21 1549  BP: 137/72 (!) 124/59 111/61 (!) 142/125  Pulse: 77 71 66 63  Resp: _0 Temp: 97.7 F (36.5 C) 98.1 F (36.7 C) 97.6 F (36.4 C) 97.9 F (36.6 C)  TempSrc: Oral Oral Oral   SpO2: 98% 99% 100% (!) 89%  Weight:      Height:        Intake/Output Summary (Last 24 hours) at 05/21/2021 1644 Last data filed at 05/21/2021 1300 Gross per 24 hour  Intake 720 ml  Output 1650 ml  Net -930 ml    Filed Weights   05/05/21 1528 05/06/21 2245 05/11/21 0511  Weight: 82.6 kg 80.9 kg 85 kg    Patient BMI: Body mass index is 30.25 kg/m.   Physical Exam: General.  Well-developed elderly man, in no acute distress. Pulmonary.  Lungs clear bilaterally, normal respiratory effort. CV.  Regular rate and rhythm, no JVD, rub or murmur. Abdomen.  Soft, nontender, nondistended, BS positive. CNS.  Alert and oriented .  No focal neurologic deficit. Extremities.  No edema, no cyanosis, pulses intact and  symmetrical. Psychiatry.  Judgment and insight appears normal.   Labs   I have personally reviewed following labs and imaging studies CBC    Component Value Date/Time   WBC 6.7 05/08/2021 0436   RBC 4.30 05/08/2021 0436   HGB 12.0 (L) 05/08/2021 0436   HGB 15.1 09/29/2013 0350   HCT 35.9 (L) 05/08/2021 0436   HCT 42.9 09/29/2013 0350   PLT 100 (L) 05/08/2021 0436   PLT 127 (L) 09/29/2013 0350   MCV 83.5 05/08/2021 0436   MCV 83 09/29/2013 0350   MCH 27.9 05/08/2021 0436   MCHC 33.4 05/08/2021 0436   RDW 14.7 05/08/2021 0436   RDW 15.2 (H) 09/29/2013 0350   LYMPHSABS 0.3 (L) 05/07/2021 0631   LYMPHSABS 1.0 09/29/2013 0350   MONOABS 0.5 05/07/2021 0631   MONOABS 0.8 09/29/2013 0350   EOSABS 0.0 05/07/2021 0631   EOSABS 0.2 09/29/2013 0350   BASOSABS  0.0 05/07/2021 0631   BASOSABS 0.1 09/29/2013 0350   Imaging Studies  No results found. Medications   Scheduled Meds:  atorvastatin  40 mg Oral QAC supper   cholecalciferol  1,000 Units Oral Daily   enoxaparin (LOVENOX) injection  30 mg Subcutaneous Q24H   gabapentin  100 mg Oral QHS   insulin aspart  0-15 Units Subcutaneous TID WC   insulin aspart  0-5 Units Subcutaneous QHS   insulin glargine-yfgn  30 Units Subcutaneous Daily   Ipratropium-Albuterol  2 puff Inhalation BID   metoprolol succinate  50 mg Oral Daily   multivitamin-lutein  1 capsule Oral BID   pantoprazole  40 mg Oral Daily   sertraline  200 mg Oral q AM   tamsulosin  0.8 mg Oral QHS   vitamin B-12  1,000 mcg Oral Daily   No recently discontinued medications to reconcile  LOS: 15 days   Time spent: 25 min  Lorella Nimrod, MD [facility] 05/21/2021, 4:44 PM   Please refer to amion to contact the [facility] Attending or Consulting provider for this pt  [email or web address] Available by Epic secure chat 7AM-7PM. If 7PM-7AM, please contact night-coverage

## 2021-05-22 DIAGNOSIS — J9601 Acute respiratory failure with hypoxia: Secondary | ICD-10-CM | POA: Diagnosis not present

## 2021-05-22 LAB — GLUCOSE, CAPILLARY
Glucose-Capillary: 114 mg/dL — ABNORMAL HIGH (ref 70–99)
Glucose-Capillary: 211 mg/dL — ABNORMAL HIGH (ref 70–99)
Glucose-Capillary: 112 mg/dL — ABNORMAL HIGH (ref 70–99)
Glucose-Capillary: 200 mg/dL — ABNORMAL HIGH (ref 70–99)

## 2021-05-22 MED ORDER — ALUM & MAG HYDROXIDE-SIMETH 200-200-20 MG/5ML PO SUSP
15.0000 mL | Freq: Four times a day (QID) | ORAL | Status: DC | PRN
  Administered 2021-05-22: 11:00:00 15 mL via ORAL
  Filled 2021-05-22: qty 30

## 2021-05-22 NOTE — Progress Notes (Signed)
PROGRESS NOTE  Peter Robertson    DOB: 1940-07-19, 80 y.o.  HAL:[id number]  PCP: Danae Orleans, MD   Code Status: Full Code   DOA: 05/05/2021   LOS: 76  Brief Narrative of Current Hospitalization  Peter Robertson is a 80 y.o. male with a PMH significant for HTN, Type 2 DM, CKDIIIa, HFpEF, CAD, dementia. They presented from home to the ED on 05/05/2021 with fall and head injury.  In the ED, it was found that they had acute respiratory distress with COVID and fluid overload. They were treated with oxygen, BiPAP, steroids, breathing treatments, remdesivir.  Patient was admitted to medicine service for further workup and management of acute hypoxic respiratory failure in setting of COVID as outlined in detail below.  Patient was discharged yesterday but insurance authorization was denied.  Appeal filed.  Patient wants to go home but unsafe discharge as he lives alone.  Also found to have a thyroid nodule which meets criteria for biopsy.  Patient was again discharged yesterday when received a verbal approval from insurance.  Apparently facility has not received the formal approval yet.  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  Completed remdesivir.  Patient able  to discharge to SNF once COVID-19 isolation precautions met by SNF.  Patient strongly wishes to go home. Off airborn precautions now.  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 -PT/OT to continue evaluation for improvement and treatments to increase strength -Mucinex as needed -Combivent twice daily  Type II DM-hemoglobin A1c 7.1 on admission which is below goal of 8 for age.  Blood sugars well controlled, were elevated early on admission due to steroid administration. -Changing insulin administration to a.m. dosing for better safety profile  -30 units Semglee a.m. -discontinued sliding scale  CKD IIIb-chronic, stable -Avoid nephrotoxic agents -BMP a.m.  GERD. -As needed Maalox  Dementia without behavioral disturbance  depression -Continue home sertraline -Delirium precautions  HLD-chronic, not on home medications  HTN-chronic, moderately well controlled -Continue home metoprolol  Fall at home, initial encounter -PT/OT  Thyroid nodule Thyroid ultrasound with a 2.5 cm right mid thyroid TR 3 nodule meets criteria for biopsy.  BPH -Continue home Flomax  DVT prophylaxis: enoxaparin (LOVENOX) injection 30 mg Start: 05/13/21 2200 SCDs Start: 05/06/21 0739   Diet:  Diet Orders (From admission, onward)     Start     Ordered   05/19/21 0000  Diet - low sodium heart healthy        05/19/21 1025   05/06/21 0727  Diet heart healthy/carb modified Room service appropriate? Yes; Fluid consistency: Thin  Diet effective now       Question Answer Comment  Diet-HS Snack? Nothing   Room service  appropriate? Yes   Fluid consistency: Thin      05/06/21 0726            Subjective 05/22/21   Patient was sitting comfortably in chair.  Having some indigestion and asking for Maalox.  He wants to go home.  Disposition Plan & Communication  Patient status: Inpatient  Admitted From: Home Disposition: Skilled nursing  facility Anticipated discharge date: Medically stable, pending appeal for insurance authorization. Family Communication: None Consults, Procedures, Significant Events  Consultants:  None  Procedures/significant events:  Thyroid US 11/28  Antimicrobials:  Anti-infectives (From admission, onward)    Start     Dose/Rate Route Frequency Ordered Stop   05/07/21 1000  remdesivir 100 mg in sodium chloride 0.9 % 100 mL IVPB       See Hyperspace for full Linked Orders Report.   100 mg 200 mL/hr over 30 Minutes Intravenous Daily 05/06/21 0649 05/10/21 1000   05/06/21 0700  remdesivir 200 mg in sodium chloride 0.9% 250 mL IVPB       See Hyperspace for full Linked Orders Report.   200 mg 580 mL/hr over 30 Minutes Intravenous Once 05/06/21 0649 05/06/21 1009   05/06/21 0515  Ampicillin-Sulbactam (UNASYN) 3 g in sodium chloride 0.9 % 100 mL IVPB        3 g 200 mL/hr over 30 Minutes Intravenous  Once 05/06/21 0512 05/06/21 0603       Objective   Vitals:   05/21/21 1959 05/22/21 0452 05/22/21 0754 05/22/21 1133  BP: (!) 160/67 133/67 (!) 150/83 (!) 103/57  Pulse: 66 70 67 63  Resp: 16 17 16 16   Temp: 98.4 F (36.9 C) 97.7 F (36.5 C) 98.7 F (37.1 C) 97.7 F (36.5 C)  TempSrc: Oral Oral Oral Oral  SpO2: 100% 98% 100% 100%  Weight:      Height:        Intake/Output Summary (Last 24 hours) at 05/22/2021 1531 Last data filed at 05/22/2021 1300 Gross per 24 hour  Intake 600 ml  Output 660 ml  Net -60 ml    Filed Weights   05/05/21 1528 05/06/21 2245 05/11/21 0511  Weight: 82.6 kg 80.9 kg 85 kg    Patient BMI: Body mass index is 30.25 kg/m.   Physical Exam: General.  Pleasant elderly man, in no acute distress. Pulmonary.  Lungs clear bilaterally, normal respiratory effort. CV.  Regular rate and rhythm, no JVD, rub or murmur. Abdomen.  Soft, nontender, nondistended, BS positive. CNS.  Alert and oriented .  No focal neurologic deficit. Extremities.  No edema, no cyanosis,  pulses intact and symmetrical. Psychiatry.  Judgment and insight appears normal.   Labs   I have personally reviewed following labs and imaging studies CBC    Component Value Date/Time   WBC 6.7 05/08/2021 0436   RBC 4.30 05/08/2021 0436   HGB 12.0 (L) 05/08/2021 0436   HGB 15.1 09/29/2013 0350   HCT 35.9 (L) 05/08/2021 0436   HCT 42.9 09/29/2013 0350   PLT 100 (L) 05/08/2021 0436   PLT 127 (L) 09/29/2013 0350   MCV 83.5 05/08/2021 0436   MCV 83 09/29/2013 0350   MCH 27.9 05/08/2021 0436   MCHC 33.4 05/08/2021 0436   RDW 14.7 05/08/2021 0436   RDW 15.2 (H) 09/29/2013 0350   LYMPHSABS 0.3 (L) 05/07/2021 0631   LYMPHSABS 1.0 09/29/2013 0350   MONOABS 0.5 05/07/2021 0631   MONOABS 0.8 09/29/2013 0350   EOSABS 0.0 05/07/2021 0631   EOSABS  0.2 09/29/2013 0350   BASOSABS 0.0 05/07/2021 0631   BASOSABS 0.1 09/29/2013 0350   Imaging Studies  No results found. Medications   Scheduled Meds:  atorvastatin  40 mg Oral QAC supper   cholecalciferol  1,000 Units Oral Daily   enoxaparin (LOVENOX) injection  30 mg Subcutaneous Q24H   gabapentin  100 mg Oral QHS   insulin aspart  0-15 Units Subcutaneous TID WC   insulin aspart  0-5 Units Subcutaneous QHS   insulin glargine-yfgn  30 Units Subcutaneous Daily   Ipratropium-Albuterol  2 puff Inhalation BID   metoprolol succinate  50 mg Oral Daily   multivitamin-lutein  1 capsule Oral BID   pantoprazole  40 mg Oral Daily   sertraline  200 mg Oral q AM   tamsulosin  0.8 mg Oral QHS   vitamin B-12  1,000 mcg Oral Daily   No recently discontinued medications to reconcile  LOS: 16 days   Time spent: 27 min  Lorella Nimrod, MD [facility] 05/22/2021, 3:31 PM   Please refer to amion to contact the [facility] Attending or Consulting provider for this pt  [email or web address] Available by Epic secure chat 7AM-7PM. If 7PM-7AM, please contact night-coverage

## 2021-05-22 NOTE — Progress Notes (Signed)
Physical Therapy Treatment Patient Details Name: Peter Robertson MRN: [id number] DOB: 09-01-1940 Today's Date: 05/22/2021   History of Present Illness Pt admitted for ARF secondary to covid with complaints of falls and SOB symptoms. History includes HTN, HLD, DM, GERD, and MI.    PT Comments    PT OOB with ease.  Stands x 3 attempts each time falling back onto bed when left to do it on his own.  Eventually given min assist for safety.  He is able to walk x 3 laps today.  Initial lap is of poor quality with min a x 1 due to post lean but he does improve with time.  Remains in chair.  Concern remains for poor balance and frequent need for hands on assist for transfers and gait to prevent fall.  He does seem to vary day to day.    Recommendations for follow up therapy are one component of a multi-disciplinary discharge planning process, led by the attending physician.  Recommendations may be updated based on patient status, additional functional criteria and insurance authorization.  Follow Up Recommendations  Skilled nursing-short term rehab (<3 hours/day)     Assistance Recommended at Discharge    Equipment Recommendations  Rolling walker (2 wheels)    Recommendations for Other Services       Precautions / Restrictions Precautions Precautions: Fall Precaution Comments: no longer on covid precautions     Mobility  Bed Mobility Overal bed mobility: Modified Independent                  Transfers Overall transfer level: Needs assistance Equipment used: Rolling walker (2 wheels)   Sit to Stand: Min assist           General transfer comment: post LOB today x 2 requires assist    Ambulation/Gait Ambulation/Gait assistance: Min guard;Min assist Gait Distance (Feet): 600 Feet Assistive device: Rolling walker (2 wheels) Gait Pattern/deviations: Step-through pattern;Decreased step length - right;Decreased step length - left;Drifts right/left;Decreased stride  length;Wide base of support;Trunk flexed Gait velocity: decreased     General Gait Details: 3 laps today.  gait quality poor initially needing min a x 1 for balance but improves with time   Stairs             Wheelchair Mobility    Modified Rankin (Stroke Patients Only)       Balance Overall balance assessment: History of Falls;Needs assistance Sitting-balance support: No upper extremity supported;Feet supported Sitting balance-Leahy Scale: Good     Standing balance support: Bilateral upper extremity supported;During functional activity Standing balance-Leahy Scale: Poor Standing balance comment: initially poor balance but does improve with time.  very high fall risk initially with gait for 200' then does get better.                            Cognition Arousal/Alertness: Awake/alert Behavior During Therapy: WFL for tasks assessed/performed Overall Cognitive Status: Within Functional Limits for tasks assessed                                          Exercises      General Comments        Pertinent Vitals/Pain Pain Assessment: Faces Faces Pain Scale: Hurts little more Pain Location: L knee stiffness Pain Descriptors / Indicators: Tightness;Sore Pain Intervention(s): Limited activity within patient's tolerance;Monitored during  session    Home Living                          Prior Function            PT Goals (current goals can now be found in the care plan section) Progress towards PT goals: Progressing toward goals    Frequency    Min 2X/week      PT Plan Current plan remains appropriate    Co-evaluation              AM-PAC PT "6 Clicks" Mobility   Outcome Measure  Help needed turning from your back to your side while in a flat bed without using bedrails?: A Little Help needed moving from lying on your back to sitting on the side of a flat bed without using bedrails?: A Little Help needed  moving to and from a bed to a chair (including a wheelchair)?: A Little Help needed standing up from a chair using your arms (e.g., wheelchair or bedside chair)?: A Little Help needed to walk in hospital room?: A Little Help needed climbing 3-5 steps with a railing? : A Lot 6 Click Score: 17    End of Session Equipment Utilized During Treatment: Gait belt Activity Tolerance: Patient tolerated treatment well Patient left: with call bell/phone within reach;in bed;with bed alarm set Nurse Communication: Mobility status PT Visit Diagnosis: Muscle weakness (generalized) (M62.81);Difficulty in walking, not elsewhere classified (R26.2);Unsteadiness on feet (R26.81)     Time: 4136-4383 PT Time Calculation (min) (ACUTE ONLY): 16 min  Charges:  $Gait Training: 8-22 mins                    Chesley Noon, PTA 05/22/21, 11:35 AM

## 2021-05-23 DIAGNOSIS — J9601 Acute respiratory failure with hypoxia: Secondary | ICD-10-CM | POA: Diagnosis not present

## 2021-05-23 LAB — GLUCOSE, CAPILLARY
Glucose-Capillary: 118 mg/dL — ABNORMAL HIGH (ref 70–99)
Glucose-Capillary: 120 mg/dL — ABNORMAL HIGH (ref 70–99)

## 2021-05-23 MED ORDER — ALUM & MAG HYDROXIDE-SIMETH 200-200-20 MG/5ML PO SUSP: 15.0000 mL | Freq: Four times a day (QID) | ORAL | 0 refills | Status: DC | PRN

## 2021-05-23 NOTE — TOC Transition Note (Signed)
Transition of Care [facility]) - CM/SW Discharge Note   Patient Details  Name: Peter Robertson MRN: [id number] Date of Birth: 04/28/1941  [facility] CM/SW Contact:  Kerin Salen, RN Phone Number: 05/23/2021, 11:08 AM   Clinical Narrative:  To discharge to [facility] [phone], Nurse to call report to [phone]. [name], neighbor notified of discharge information, voices understanding. TOC barriers resolved.     Final next level of care: Skilled Nursing Facility Barriers to Discharge: Barriers Resolved   Patient Goals and CMS Choice Patient states their goals for this hospitalization and ongoing recovery are:: Patient agrees to rehab at [facility].gov Compare Post Acute Care list provided to:: Patient Choice offered to / list presented to : Patient  Discharge Placement              Patient chooses bed at: [facility] Patient to be transferred to facility by: [place] Name of family member notified: [name] neighbor Patient and family notified of of transfer: 05/23/21  Discharge Plan and Services     Post Acute Care Choice: [place]          DME Arranged: N/A DME Agency: NA       HH Arranged: NA HH Agency: NA        Social Determinants of Health (SDOH) Interventions     Readmission Risk Interventions No flowsheet data found.

## 2021-05-23 NOTE — Progress Notes (Signed)
Patient discharged via EMS to [facility] for rehab.  Report called to Lahoma Crocker, nurse at SNF, all questions answered.  AVS given to EMS for transport.

## 2021-05-23 NOTE — Progress Notes (Signed)
Occupational Therapy Treatment Patient Details Name: Peter Robertson MRN: [id number] DOB: 19-Feb-1941 Today's Date: 05/23/2021   History of present illness Pt admitted for ARF secondary to covid with complaints of falls and SOB symptoms. History includes HTN, HLD, DM, GERD, and MI.   OT comments  Pt up in chair upon OT arrival.  Participated in standing ADL 8 min at sink for shaving.  1 large LOB posteriorly when Ues were not supported on countertop, recovered with min A from OT.  OT reinforced safety with making sure to position self to rest hips against countertop or to keep 1 hand on support surface at all times during standing ADLs.  Close supv with RW to complete fxl mobility in room for ADLs.  Demonstrated good ability to maintain balance when reaching outside BOS to lower toilet seat lid when keeping 1 hand on walker for support.  Supv for all functional transfers this day.  02 sats remained 95-98% throughout session on room air.  Anticipate d/c this day to [facility] for rehab d/t hx of frequent falls, decreased safety awareness, and only intermittent support from brother and neighbor at home.  Left pt in chair with alarm set and breakfast tray, call light and phone within reach.  OT reminded pt to use call light if needing to get up.     Recommendations for follow up therapy are one component of a multi-disciplinary discharge planning process, led by the attending physician.  Recommendations may be updated based on patient status, additional functional criteria and insurance authorization.    Follow Up Recommendations  Skilled nursing-short term rehab (<3 hours/day) (Hx of frequent falls, large LOB with standing ADL, decreased safety)    Assistance Recommended at Discharge Frequent or constant Supervision/Assistance  Equipment Recommendations  None recommended by OT          Precautions / Restrictions Precautions Precautions: Fall Precaution Comments: no longer on covid  precautions Restrictions Weight Bearing Restrictions: No       Mobility Bed Mobility               General bed mobility comments: pt in chair upon OT arrival; left in chair at end of session as breakfast tray had just arrived. Patient Response: Cooperative  Transfers Overall transfer level: Needs assistance Equipment used: Rolling walker (2 wheels) Transfers: Sit to/from Stand Sit to Stand: Supervision   Step pivot transfers: Supervision             Balance Overall balance assessment: History of Falls;Needs assistance Sitting-balance support: No upper extremity supported;Feet supported Sitting balance-Leahy Scale: Good   Postural control: Posterior lean Standing balance support: Single extremity supported Standing balance-Leahy Scale: Fair Standing balance comment: 1 large LOB posteriorly when standing at sink to shave when not holding to sink for support, recovered with min A; able to reach outside BOS with 1 hand on support surface when reaching to lower toilet seat with close supv only.  Consistent to maintain balance with 1 hand on support surface.  Demonstrated no LOB with 2 hands on walker to complete fxl mobility in room, turning corners, turning in place in prep for transfers.                           ADL either performed or assessed with clinical judgement   ADL Overall ADL's : Needs assistance/impaired Eating/Feeding: Independent   Grooming: Supervision/safety;Standing Grooming Details (indicate cue type and reason): stood at sink to shave x8 min;  1 large LOB posteriorly when not holding on to countertop with 1 hand, recovered with min A from OT.                 Toilet Transfer: Supervision/safety;Regular Toilet;Grab bars;Rolling walker (2 wheels) Toilet Transfer Details (indicate cue type and reason): able to reach outside BOS with 1 hand on walker to lower toilet seat in prep for sitting Toileting- Clothing Manipulation and Hygiene:  Supervision/safety;Sit to/from stand Toileting - Clothing Manipulation Details (indicate cue type and reason): 1 hand on grab bar for support     Functional mobility during ADLs: Supervision/safety;Rolling walker (2 wheels) General ADL Comments: ambulatory in room for ADL completion and toilet transfer; 02 sats remained 95-98% on room air    Extremity/Trunk Assessment Upper Extremity Assessment Upper Extremity Assessment: Overall WFL for tasks assessed   Lower Extremity Assessment Lower Extremity Assessment: Overall WFL for tasks assessed        Vision Baseline Vision/History: 1 Wears glasses Patient Visual Report: No change from baseline     Perception     Praxis      Cognition Arousal/Alertness: Awake/alert Behavior During Therapy: WFL for tasks assessed/performed (frustrated with plans for [facility] and not d/c home) Overall Cognitive Status: Within Functional Limits for tasks assessed                                 General Comments: decreased safety awareness          Exercises Other Exercises Other Exercises: OT engages pt in fxl mobility for ADL completion with close supv using RW and no LOB Other Exercises: issued red theraband with handout and completed while seated 1 set 10 reps for bilat shoulder horiz abd, shoulder elevation, and bicep/tricep strengthening; mod vc for accuracy.   Shoulder Instructions       General Comments      Pertinent Vitals/ Pain       Pain Score: 0-No pain                                            Prior Functioning/Environment              Frequency  Min 2X/week        Progress Toward Goals  OT Goals(current goals can now be found in the care plan section)  Progress towards OT goals: Progressing toward goals  Acute Rehab OT Goals Patient Stated Goal: to go home OT Goal Formulation: With patient Time For Goal Achievement: 06/04/21 Potential to Achieve Goals: Good  Plan Other  (comment) (d/c plan for today to [facility])    Co-evaluation                 AM-PAC OT "6 Clicks" Daily Activity     Outcome Measure   Help from another person eating meals?: None Help from another person taking care of personal grooming?: A Little Help from another person toileting, which includes using toliet, bedpan, or urinal?: A Little Help from another person bathing (including washing, rinsing, drying)?: A Little Help from another person to put on and taking off regular upper body clothing?: None Help from another person to put on and taking off regular lower body clothing?: None 6 Click Score: 21    End of Session Equipment Utilized During Treatment: Rolling walker (2 wheels)  OT Visit Diagnosis: Unsteadiness on feet (R26.81);History of falling (Z91.81);Muscle weakness (generalized) (M62.81)   Activity Tolerance Patient tolerated treatment well   Patient Left with call bell/phone within reach;in chair;with chair alarm set   Nurse Communication Mobility status        Time: 1155-2080 OT Time Calculation (min): 32 min  Charges: OT General Charges $OT Visit: 1 Visit OT Treatments $Self Care/Home Management : 23-37 mins  Leta Speller, MS, OTR/L   Darleene Cleaver 05/23/2021, 9:48 AM

## 2021-05-23 NOTE — Discharge Summary (Signed)
Physician Discharge Summary  WILLEY DUE GYK:[id number] DOB: 10-Jul-1940 DOA: 05/05/2021  PCP: Danae Orleans, MD  Admit date: 05/05/2021 Discharge date: 05/23/2021  Admitted From: Home Disposition: Skilled nursing facility  Recommendations for Outpatient Follow-up:  Follow up with PCP within 1-2 weeks to monitor Bps with the medication changes listed below.  Follow up with IR outpatient to complete thyroid nodule FNA Bx  Discharge Condition:stable, improved CODE STATUS:  Code Status: Full Code  Regular healthy diet  Brief/Interim Summary: Pt presented initially due to a fall with only minor injuries. On presentation, he was found to have acute respiratory failure that initially required bipap for a brief time in the setting of being COVID-19 positive. He completed remdesivir treatment and able to wean to room air pretty quickly. His respiratory symptoms resolved but his discharge was delayed due to generalized deconditioning from his illness requiring SNF level care at discharge. He remained at the hospital until his COVID isolation requirements were met by the facility and was discharged in stable condition.  Discharge was further delayed due to some paperwork and insurance issues.  Patient really wants to go home and hopefully will be discharged back home after completing his rehab. He lives alone and some neighbors look over, they do not think that he is currently safe and our physical therapist is also recommending going to rehab before returning home.  Additionally,  - patient was discontinued from his home imdur and lisinopril due to his blood pressures being well controlled on metoprolol alone and were hypotensive with the additional agents.  - had incidental finding of >1cm thyroid nodule on imaging which was [place] and recommended biopsy. Order was placed and IR was consulted to complete biopsy outpatient. Patient was notified of this finding and the need for follow up.   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)   Hyperlipidemia   Hypertension   Elevated troponin   Depression   CKD (chronic kidney disease), stage IIIa   Thyroid nodule   Fall at home, initial encounter   Acute on chronic diastolic CHF (congestive heart failure) (HCC)   Head injury   Weakness generalized   Thyroid nodule greater than or equal to 1 cm in diameter incidentally noted on imaging study   Discharge Instructions     Diet - low sodium heart healthy   Complete by: As directed    Increase activity slowly   Complete by: As directed    Increase activity slowly   Complete by: As directed       Allergies as of 05/23/2021       Reactions   Sulfamethoxazole-trimethoprim Other (See Comments)   dizziness dizziness dizziness   Tape Rash   blisters        Medication List     STOP taking these medications    furosemide 20 MG tablet Commonly known as: LASIX   insulin regular 100 units/mL injection Commonly known as: NOVOLIN R   isosorbide mononitrate 120 MG 24 hr tablet Commonly known as: IMDUR   lisinopril 10 MG tablet Commonly known as: ZESTRIL   metFORMIN 500 MG tablet Commonly known as: GLUCOPHAGE   omeprazole 40 MG capsule Commonly known as: PRILOSEC Replaced by: pantoprazole  40 MG tablet       TAKE these medications    acetaminophen 325 MG tablet Commonly known as: TYLENOL Take 2 tablets (650 mg total) by mouth every 6 (six) hours as needed for mild pain or fever. What changed:  medication strength how much to take when to take this reasons to take this   alum & mag hydroxide-simeth [phone] MG/5ML suspension Commonly known as:  MAALOX/MYLANTA Take 15 mLs by mouth every 6 (six) hours as needed for indigestion or heartburn.   atorvastatin 40 MG tablet Commonly known as: LIPITOR Take 40 mg by mouth daily before supper.   cholecalciferol 25 MCG (1000 UNIT) tablet Commonly known as: VITAMIN D3 Take 1,000 Units by mouth daily.   clopidogrel 75 MG tablet Commonly known as: PLAVIX Take 75 mg by mouth daily.   gabapentin 100 MG capsule Commonly known as: NEURONTIN Take 100 mg by mouth at bedtime.   insulin glargine 100 UNIT/ML injection Commonly known as: LANTUS Inject 0.3 mLs (30 Units total) into the skin daily. What changed:  how much to take when to take this   Melatonin 10 MG Caps Take 10 mg by mouth at bedtime as needed (sleep).   metoprolol succinate 50 MG 24 hr tablet Commonly known as: TOPROL-XL 50 mg daily. (Take with 112m tablet to equal 1511mtotal) What changed: Another medication with the same name was removed. Continue taking this medication, and follow the directions you see here.   nitroGLYCERIN 0.4 MG SL tablet Commonly known as: NITROSTAT Place 0.4 mg under the tongue every 5 (five) minutes as needed for chest pain.   pantoprazole 40 MG tablet Commonly known as: PROTONIX Take 1 tablet (40 mg total) by mouth daily. Replaces: omeprazole 40 MG capsule   PreserVision AREDS 2+Multi Vit Caps Take 1 capsule by mouth 2 (two) times daily.   sertraline 100 MG tablet Commonly known as: ZOLOFT Take 200 mg by mouth in the morning.   tamsulosin 0.4 MG Caps capsule Commonly known as: FLOMAX Take 0.8 mg by mouth at bedtime.   vitamin B-12 1000 MCG tablet Commonly known as: CYANOCOBALAMIN Take 1,000 mcg by mouth daily.        Contact information for follow-up providers     LuDanae OrleansMD .   Specialty: Internal Medicine Why: As needed Contact information: [address] Me[address] [facility] LLCr[id number].   Why: A scheduler from our office will call you to arrange a date/time for your thyroid biopsy. Please call our office with any questions prior to your appointment. Contact information: [phone]            Contact information for after-discharge care     [facility] .   Service: Skilled Nursing Contact information: [phone]                  Allergies  Allergen Reactions   Sulfamethoxazole-Trimethoprim Other (See Comments)    dizziness dizziness dizziness    Tape Rash    blisters    Consultations: IR  Procedures/Studies: CT Abdomen Pelvis Wo Contrast  Result Date: 05/06/2021 CLINICAL DATA:  [age]ear old male with acute onset vomiting. EXAM: CT ABDOMEN AND PELVIS WITHOUT CONTRAST  TECHNIQUE: Multidetector CT imaging of the abdomen and pelvis was performed following the standard protocol without IV contrast. COMPARISON:  CT Abdomen and Pelvis 02/12/2020, and earlier. Chest CT 08/27/2019. FINDINGS: Lower chest: Chronic nodular lung disease, with frequent bulky calcified pulmonary nodules, and chronic coarse pleural calcifications in both costophrenic angles. Superimposed small new layering right pleural effusion, and trace effusion on the left. No cardiomegaly. No pericardial effusion. Hepatobiliary: Negative noncontrast liver. Possible gallbladder layering sludge on series 2, image 33. But no gallbladder distension or pericholecystic inflammation. Pancreas: Chronic dystrophic calcifications throughout the pancreatic head and uncinate appears stable since last year. No pancreatic inflammation is evident. Spleen: Chronic splenomegaly. Estimated splenic volume is 840 mL (normal splenic volume range 83 - 412 mL). But this has only mildly  increased since 2017. No discrete splenic lesion. No perisplenic fluid. Adrenals/Urinary Tract: Adrenal glands are stable and negative. Right kidney appears nonobstructed and stable since 2017. No right hydroureter. Bulky 11 mm left lower pole calculus. Chronic asymmetric left renal pelvis appears stable since 2017. No left hydroureter. No acute pararenal or periureteral inflammation. Chronic mildly thick-walled urinary bladder with an anterior small bladder diverticulum unchanged on series 2, image 75. Chronic pelvic phleboliths. Stomach/Bowel: Negative large bowel aside from redundancy and occasional diverticula. The cecum is located on a lax mesentery in the anterior right upper quadrant. Normal appendix on series 2, image 48. Negative terminal ileum. No dilated small bowel. Gas containing but otherwise negative stomach. Fluid in the duodenum. Small distal duodenal diverticulum without active inflammation on series 2, image 36. No free air or free fluid. Vascular/Lymphatic: Aortoiliac calcified atherosclerosis. Normal caliber abdominal aorta. Vascular patency is not evaluated in the absence of IV contrast. There are chronic, matted aortocaval lymph nodes in the lower retroperitoneum (series 2, image 49) which are stable since 2017. No other lymphadenopathy is evident on this noncontrast exam. Reproductive: Chronic prostatomegaly and small fat containing inguinal hernias. Other: No pelvic free fluid. Musculoskeletal: Chronic posterior left lower rib fractures. Chronic L1 compression fracture is stable since last year. No acute osseous abnormality identified. IMPRESSION: 1. A small layering right pleural effusion is new since last year. But otherwise visible extensive nodular, partially calcified chronic lung and pleural disease is stable. 2. No acute or inflammatory process identified in the non-contrast abdomen or pelvis. 3. Bulky left lower pole renal calculus is stable with no convincing obstructive uropathy.  Chronic splenomegaly is nonspecific and only mildly progressed since 2017. And superimposed mild chronic retroperitoneal lymphadenopathy is stable since 2017. Chronic bladder wall thickening and small bladder diverticulum. Chronic prostatomegaly. Aortic Atherosclerosis (ICD10-I70.0). Electronically Signed   By: Genevie Ann M.D.   On: 05/06/2021 05:49   DG Chest 2 View  Result Date: 05/05/2021 CLINICAL DATA:  Status post fall. EXAM: CHEST - 2 VIEW COMPARISON:  August 30, 2020 FINDINGS: Multiple sternal wires are seen. Bilateral chronic appearing lung markings are noted with multiple calcified lung nodules of various sizes seen bilaterally. There is no evidence of acute infiltrate, pleural effusion or pneumothorax. The cardiac silhouette is within normal limits. The visualized skeletal structures are unremarkable. IMPRESSION: 1. Evidence of prior median sternotomy with findings consistent with history of sarcoidosis. 2. No acute or active cardiopulmonary disease. Electronically Signed   By: Virgina Norfolk M.D.   On: 05/05/2021 21:31   CT HEAD WO CONTRAST (5MM)  Result Date: 05/05/2021 CLINICAL DATA:  Recent fall with headaches and neck pain, initial encounter EXAM: CT HEAD WITHOUT CONTRAST CT CERVICAL SPINE WITHOUT CONTRAST TECHNIQUE:  Multidetector CT imaging of the head and cervical spine was performed following the standard protocol without intravenous contrast. Multiplanar CT image reconstructions of the cervical spine were also generated. COMPARISON:  08/30/2020, CT of the head from earlier in the same day. FINDINGS: CT HEAD FINDINGS Brain: The area of increased attenuation adjacent to the left frontal cortex is no longer identified and felt to be artifactual in nature likely related to mild motion and beam hardening artifact. Mild atrophic changes are seen. No findings to suggest acute hemorrhage, acute infarction or space-occupying mass lesion is noted. Vascular: No hyperdense vessel or unexpected  calcification. Skull: Normal. Negative for fracture or focal lesion. Sinuses/Orbits: No acute finding. Other: None. CT CERVICAL SPINE FINDINGS Alignment: Mild straightening of the normal cervical lordosis is noted. Skull base and vertebrae: 7 cervical segments are well visualized. Vertebral body height is well maintained. Mild osteophytic changes are noted from C4-C7. Mild anterolisthesis of C4 on C5 is seen. Facet hypertrophic changes are noted bilaterally. No acute fracture or acute facet abnormality is seen. Soft tissues and spinal canal: Surrounding soft tissue structures are within normal limits with the exception of a hypodense nodule within the right lobe of the thyroid measuring 19 mm. This is stable in appearance from the prior CT. Upper chest: Visualized lung apices demonstrate biapical scarring. Right-sided pleural effusion is noted new from the prior exam. Chest x-ray is recommended for further evaluation. Other: None IMPRESSION: CT of the head: No acute intracranial abnormality noted. The previously seen area of suggested hemorrhage is felt to be artifactual in nature related to motion artifact. CT of the cervical spine: Multi level degenerative changes similar to that seen on prior exams. 1.9 cm right thyroid nodule. Recommend nonemergent thyroid US (ref: J Am Coll Radiol. 2015 Feb;12(2): 143-50). New right-sided pleural effusion. Chest x-ray is recommended for further evaluation. Electronically Signed   By: Inez Catalina M.D.   On: 05/05/2021 20:16   CT Head Wo Contrast  Result Date: 05/05/2021 CLINICAL DATA:  Trauma, fall EXAM: CT HEAD WITHOUT CONTRAST TECHNIQUE: Contiguous axial images were obtained from the base of the skull through the vertex without intravenous contrast. COMPARISON:  08/30/2020 FINDINGS: Brain: Motion artifacts are seen in the some of the images. Additional images were obtained.rrw in image 19 of series 3, there is 8 x 4 mm area of subtle increased density in the left frontal  cortex. Area of this finding is not included in the repeated images. There is no significant focal mass effect. There is no demonstrable epidural or subdural fluid collection. Ventricles are prominent. There is no shift of midline structures. Cortical sulci are prominent. There is subtle decreased density in the periventricular white matter. Vascular: Unremarkable. Skull: No fracture is seen in calvarium. Sinuses/Orbits: Unremarkable. Other: None IMPRESSION: There is 8 mm subtle increased density in the left frontal cortex close to the inner table of calvarium. This may suggest small focus of petechial hemorrhage due to contusion. In view of motion artifacts, possibility an artifact is not excluded. Short-term follow-up CT or MRI in 2-3 hours may be considered. Atrophy. Small-vessel disease. Electronically Signed   By: Elmer Picker M.D.   On: 05/05/2021 16:45   CT Cervical Spine Wo Contrast  Result Date: 05/05/2021 CLINICAL DATA:  Recent fall with headaches and neck pain, initial encounter EXAM: CT HEAD WITHOUT CONTRAST CT CERVICAL SPINE WITHOUT CONTRAST TECHNIQUE: Multidetector CT imaging of the head and cervical spine was performed following the standard protocol without intravenous contrast. Multiplanar CT image  reconstructions of the cervical spine were also generated. COMPARISON:  08/30/2020, CT of the head from earlier in the same day. FINDINGS: CT HEAD FINDINGS Brain: The area of increased attenuation adjacent to the left frontal cortex is no longer identified and felt to be artifactual in nature likely related to mild motion and beam hardening artifact. Mild atrophic changes are seen. No findings to suggest acute hemorrhage, acute infarction or space-occupying mass lesion is noted. Vascular: No hyperdense vessel or unexpected calcification. Skull: Normal. Negative for fracture or focal lesion. Sinuses/Orbits: No acute finding. Other: None. CT CERVICAL SPINE FINDINGS Alignment: Mild straightening  of the normal cervical lordosis is noted. Skull base and vertebrae: 7 cervical segments are well visualized. Vertebral body height is well maintained. Mild osteophytic changes are noted from C4-C7. Mild anterolisthesis of C4 on C5 is seen. Facet hypertrophic changes are noted bilaterally. No acute fracture or acute facet abnormality is seen. Soft tissues and spinal canal: Surrounding soft tissue structures are within normal limits with the exception of a hypodense nodule within the right lobe of the thyroid measuring 19 mm. This is stable in appearance from the prior CT. Upper chest: Visualized lung apices demonstrate biapical scarring. Right-sided pleural effusion is noted new from the prior exam. Chest x-ray is recommended for further evaluation. Other: None IMPRESSION: CT of the head: No acute intracranial abnormality noted. The previously seen area of suggested hemorrhage is felt to be artifactual in nature related to motion artifact. CT of the cervical spine: Multi level degenerative changes similar to that seen on prior exams. 1.9 cm right thyroid nodule. Recommend nonemergent thyroid US (ref: J Am Coll Radiol. 2015 Feb;12(2): 143-50). New right-sided pleural effusion. Chest x-ray is recommended for further evaluation. Electronically Signed   By: Inez Catalina M.D.   On: 05/05/2021 20:16   DG Chest Port 1 View  Result Date: 05/06/2021 CLINICAL DATA:  Shortness of breath EXAM: PORTABLE CHEST 1 VIEW COMPARISON:  05/05/2021 FINDINGS: 0430 hours. Interval progression of patchy bilateral airspace disease on a background of chronic interstitial change. Calcified lymph nodes are again noted in the mediastinum and hilar regions with calcified pleural/parenchymal lung nodules best appreciated at the bases. The cardiopericardial silhouette is within normal limits for size. Telemetry leads overlie the chest. IMPRESSION: Interval progression of patchy bilateral airspace disease suggesting pulmonary edema although  diffuse infection cannot be excluded. Electronically Signed   By: Misty Stanley M.D.   On: 05/06/2021 04:57   ECHOCARDIOGRAM COMPLETE  Result Date: 05/09/2021    ECHOCARDIOGRAM REPORT   Patient Name:   MIGUEL CHRISTIANA Date of Exam: 05/09/2021 Medical Rec #:  [id number]     Height:       66.0 in Accession #:    [id number]    Weight:       178.4 lb Date of Birth:  25-Dec-1940     BSA:          1.905 m Patient Age:    21 years      BP:           142/80 mmHg Patient Gender: M             HR:           78 bpm. Exam Location:  [facility] Procedure: 2D Echo, Cardiac Doppler and Color Doppler Indications:     CHF-acute diastolic O97.35  History:         Patient has prior history of Echocardiogram examinations, most  recent 05/21/2018. CAD and Previous Myocardial Infarction,                  Signs/Symptoms:Murmur; Risk Factors:Diabetes and Hypertension.                  CKD.  Sonographer:     Sherrie Sport Referring Phys:  [id number] SENDIL K [facility] Diagnosing Phys: Kathlyn Sacramento MD IMPRESSIONS  1. Left ventricular ejection fraction, by estimation, is 55 to 60%. The left ventricle has normal function. Left ventricular endocardial border not optimally defined to evaluate regional wall motion. There is moderate left ventricular hypertrophy. Left ventricular diastolic parameters are consistent with Grade II diastolic dysfunction (pseudonormalization).  2. Right ventricular systolic function is normal. The right ventricular size is normal. There is mildly elevated pulmonary artery systolic pressure.  3. Left atrial size was mildly dilated.  4. Right atrial size was mildly dilated.  5. The mitral valve is normal in structure. No evidence of mitral valve regurgitation. No evidence of mitral stenosis.  6. The aortic valve is normal in structure. Aortic valve regurgitation is not visualized. Aortic valve sclerosis/calcification is present, without any evidence of aortic stenosis.  7. The inferior vena cava is normal in size with  greater than 50% respiratory variability, suggesting right atrial pressure of 3 mmHg. FINDINGS  Left Ventricle: Left ventricular ejection fraction, by estimation, is 55 to 60%. The left ventricle has normal function. Left ventricular endocardial border not optimally defined to evaluate regional wall motion. The left ventricular internal cavity size was normal in size. There is moderate left ventricular hypertrophy. Left ventricular diastolic parameters are consistent with Grade II diastolic dysfunction (pseudonormalization). Right Ventricle: The right ventricular size is normal. No increase in right ventricular wall thickness. Right ventricular systolic function is normal. There is mildly elevated pulmonary artery systolic pressure. The tricuspid regurgitant velocity is 2.78  m/s, and with an assumed right atrial pressure of 5 mmHg, the estimated right ventricular systolic pressure is 34.1 mmHg. Left Atrium: Left atrial size was mildly dilated. Right Atrium: Right atrial size was mildly dilated. Pericardium: There is no evidence of pericardial effusion. Mitral Valve: The mitral valve is normal in structure. No evidence of mitral valve regurgitation. No evidence of mitral valve stenosis. MV peak gradient, 5.3 mmHg. The mean mitral valve gradient is 2.0 mmHg. Tricuspid Valve: The tricuspid valve is normal in structure. Tricuspid valve regurgitation is mild . No evidence of tricuspid stenosis. Aortic Valve: The aortic valve is normal in structure. Aortic valve regurgitation is not visualized. Aortic valve sclerosis/calcification is present, without any evidence of aortic stenosis. Aortic valve mean gradient measures 3.5 mmHg. Aortic valve peak  gradient measures 5.0 mmHg. Aortic valve area, by VTI measures 2.84 cm. Pulmonic Valve: The pulmonic valve was normal in structure. Pulmonic valve regurgitation is not visualized. No evidence of pulmonic stenosis. Aorta: The aortic root is normal in size and structure. Venous:  The inferior vena cava is normal in size with greater than 50% respiratory variability, suggesting right atrial pressure of 3 mmHg. IAS/Shunts: No atrial level shunt detected by color flow Doppler.  LEFT VENTRICLE PLAX 2D LVIDd:         4.30 cm   Diastology LVIDs:         3.00 cm   LV e' medial:    4.68 cm/s LV PW:         1.60 cm   LV E/e' medial:  20.9 LV IVS:        1.80 cm  LV e' lateral:   7.72 cm/s LVOT diam:     2.10 cm   LV E/e' lateral: 12.7 LV SV:         63 LV SV Index:   33 LVOT Area:     3.46 cm  LEFT ATRIUM           Index        RIGHT ATRIUM           Index LA diam:      3.80 cm 1.99 cm/m   RA Area:     20.40 cm LA Vol (A2C): 55.1 ml 28.93 ml/m  RA Volume:   65.50 ml  34.39 ml/m LA Vol (A4C): 25.9 ml 13.60 ml/m  AORTIC VALVE                    PULMONIC VALVE AV Area (Vmax):    2.74 cm     PV Vmax:        1.11 m/s AV Area (Vmean):   2.25 cm     PV Vmean:       70.400 cm/s AV Area (VTI):     2.84 cm     PV VTI:         0.182 m AV Vmax:           111.30 cm/s  PV Peak grad:   4.9 mmHg AV Vmean:          88.200 cm/s  PV Mean grad:   2.0 mmHg AV VTI:            0.223 m      RVOT Peak grad: 4 mmHg AV Peak Grad:      5.0 mmHg AV Mean Grad:      3.5 mmHg LVOT Vmax:         88.10 cm/s LVOT Vmean:        57.400 cm/s LVOT VTI:          0.183 m LVOT/AV VTI ratio: 0.82  AORTA Ao Root diam: 3.67 cm MITRAL VALVE               TRICUSPID VALVE MV Area (PHT): 5.16 cm    TR Peak grad:   30.9 mmHg MV Area VTI:   2.56 cm    TR Vmax:        278.00 cm/s MV Peak grad:  5.3 mmHg MV Mean grad:  2.0 mmHg    SHUNTS MV Vmax:       1.15 m/s    Systemic VTI:  0.18 m MV Vmean:      64.1 cm/s   Systemic Diam: 2.10 cm MV Decel Time: 147 msec    Pulmonic VTI:  0.169 m MV E velocity: 97.70 cm/s MV A velocity: 78.00 cm/s MV E/A ratio:  1.25 Kathlyn Sacramento MD Electronically signed by Kathlyn Sacramento MD Signature Date/Time: 05/09/2021/2:35:00 PM    Final    US THYROID  Result Date: 05/16/2021 CLINICAL DATA:  Thyroid nodule  by CT EXAM: THYROID ULTRASOUND TECHNIQUE: Ultrasound examination of the thyroid gland and adjacent soft tissues was performed. COMPARISON:  05/05/2021 cervical spine CT FINDINGS: Parenchymal Echotexture: Mildly heterogenous Isthmus: 2.2 mm Right lobe: 4.8 x 2.4 x 2.1 cm Left lobe: 3.7 x 2.0 x 1.8 cm _________________________________________________________ Estimated total number of nodules >/= 1 cm: 1 Number of spongiform nodules >/=  2 cm not described below (TR1): 0 Number of mixed cystic and solid nodules >/= 1.5 cm not described below (  TR2): 0 _________________________________________________________ Nodule # 1: Location: Right; Mid Maximum size: 2.5 cm; Other 2 dimensions: 1.8 x 1.6 cm Composition: solid/almost completely solid (2) Echogenicity: isoechoic (1) Shape: not taller-than-wide (0) Margins: ill-defined (0) Echogenic foci: none (0) ACR TI-RADS total points: 3. ACR TI-RADS risk category: TR3 (3 points). ACR TI-RADS recommendations: **Given size (>/= 2.5 cm) and appearance, fine needle aspiration of this mildly suspicious nodule should be considered based on TI-RADS criteria. _________________________________________________________ Left lower pole dystrophic calcification measures 3 mm. No hypervascularity.  No regional adenopathy. IMPRESSION: 2.5 cm right mid thyroid TR 3 nodule meets criteria for biopsy as above. This correlates with the CT finding. The above is in keeping with the ACR TI-RADS recommendations - J Am Coll Radiol 2017;14:587-595. Electronically Signed   By: Jerilynn Mages.  Shick M.D.   On: 05/16/2021 10:47    Subjective: Patient was seen and examined today.  No new complaints.  He really wants to go home and after having a long discussion he agrees to go to rehab with the hope to return home as soon as possible.  Discharge Exam: Vitals:   05/23/21 0402 05/23/21 0803  BP: 127/68 106/65  Pulse: 66 64  Resp: 16 16  Temp: 98.1 F (36.7 C) 98.8 F (37.1 C)  SpO2: 99% 100%   General.   Elderly gentleman, in no acute distress. Pulmonary.  Lungs clear bilaterally, normal respiratory effort. CV.  Regular rate and rhythm, no JVD, rub or murmur. Abdomen.  Soft, nontender, nondistended, BS positive. CNS.  Alert and oriented .  No focal neurologic deficit. Extremities.  No edema, no cyanosis, pulses intact and symmetrical. Psychiatry.  Judgment and insight appears normal.   Labs: Basic Metabolic Panel: No results for input(s): NA, K, CL, CO2, GLUCOSE, BUN, CREATININE, CALCIUM, MG, PHOS in the last 168 hours.   Time coordinating discharge: Over 30 minutes  Lorella Nimrod, MD  [facility] 05/23/2021, 10:02 AM Pager   If 7PM-7AM, please contact night-coverage [email or web address] Password [email or web address]

## 2021-05-23 NOTE — Care Management Important Message (Signed)
Important Message  Patient Details  Name: MARKHI KLECKNER MRN: [id number] Date of Birth: Jan 13, 1941   Medicare Important Message Given:  Yes     Juliann Pulse A Sidharth Leverette 05/23/2021, 11:05 AM

## 2021-05-24 DIAGNOSIS — I5032 Chronic diastolic (congestive) heart failure: Secondary | ICD-10-CM | POA: Insufficient documentation

## 2021-08-14 ENCOUNTER — Emergency Department: Payer: Medicare PPO

## 2021-08-14 ENCOUNTER — Other Ambulatory Visit: Payer: Self-pay

## 2021-08-14 ENCOUNTER — Emergency Department
Admission: EM | Admit: 2021-08-14 | Discharge: 2021-08-15 | Disposition: A | Discharge status: Home / Self Care | Payer: Medicare PPO | Attending: Emergency Medicine | Admitting: Emergency Medicine

## 2021-08-14 DIAGNOSIS — M25511 Pain in right shoulder: Secondary | ICD-10-CM | POA: Insufficient documentation

## 2021-08-14 DIAGNOSIS — R1011 Right upper quadrant pain: Secondary | ICD-10-CM | POA: Insufficient documentation

## 2021-08-14 DIAGNOSIS — R1013 Epigastric pain: Secondary | ICD-10-CM | POA: Insufficient documentation

## 2021-08-14 DIAGNOSIS — R109 Unspecified abdominal pain: Secondary | ICD-10-CM

## 2021-08-14 LAB — CBC WITH DIFFERENTIAL/PLATELET
Abs Immature Granulocytes: 0.03 10*3/uL (ref 0.00–0.07)
Basophils Absolute: 0 10*3/uL (ref 0.0–0.1)
Basophils Relative: 1 %
Eosinophils Absolute: 0.1 10*3/uL (ref 0.0–0.5)
Eosinophils Relative: 1 %
HCT: 34.1 % — ABNORMAL LOW (ref 39.0–52.0)
Hemoglobin: 11.1 g/dL — ABNORMAL LOW (ref 13.0–17.0)
Immature Granulocytes: 1 %
Lymphocytes Relative: 10 %
Lymphs Abs: 0.6 10*3/uL — ABNORMAL LOW (ref 0.7–4.0)
MCH: 27.1 pg (ref 26.0–34.0)
MCHC: 32.6 g/dL (ref 30.0–36.0)
MCV: 83.4 fL (ref 80.0–100.0)
Monocytes Absolute: 0.6 10*3/uL (ref 0.1–1.0)
Monocytes Relative: 10 %
Neutro Abs: 4.4 10*3/uL (ref 1.7–7.7)
Neutrophils Relative %: 77 %
Platelets: 104 10*3/uL — ABNORMAL LOW (ref 150–400)
RBC: 4.09 MIL/uL — ABNORMAL LOW (ref 4.22–5.81)
RDW: 15.4 % (ref 11.5–15.5)
WBC: 5.7 10*3/uL (ref 4.0–10.5)
nRBC: 0 % (ref 0.0–0.2)

## 2021-08-14 LAB — URINALYSIS, ROUTINE W REFLEX MICROSCOPIC
Bacteria, UA: NONE SEEN
Bilirubin Urine: NEGATIVE
Glucose, UA: 50 mg/dL — AB
Hgb urine dipstick: NEGATIVE
Ketones, ur: NEGATIVE mg/dL
Leukocytes,Ua: NEGATIVE
Nitrite: NEGATIVE
Protein, ur: 100 mg/dL — AB
Specific Gravity, Urine: 1.012 (ref 1.005–1.030)
Squamous Epithelial / HPF: NONE SEEN (ref 0–5)
pH: 7 (ref 5.0–8.0)

## 2021-08-14 LAB — LACTIC ACID, PLASMA
Lactic Acid, Venous: 0.8 mmol/L (ref 0.5–1.9)
Lactic Acid, Venous: 1 mmol/L (ref 0.5–1.9)

## 2021-08-14 MED ORDER — LABETALOL HCL 5 MG/ML IV SOLN
10.0000 mg | Freq: Once | INTRAVENOUS | Status: AC
  Administered 2021-08-14: 10 mg via INTRAVENOUS
  Filled 2021-08-14: qty 4

## 2021-08-14 NOTE — Discharge Instructions (Addendum)
Please return for worsening pain or fever or any other problems like weakness or vomiting.  There is some arthritis in your shoulder.  I do not see anything going on in the abdomen to explain the pain.  Please follow-up with your doctor.  Your doctor may also want to work on your blood pressure.  Its been high tonight.  Use Tylenol for pain for now.  Do not exceed the recommended dose.

## 2021-08-14 NOTE — ED Triage Notes (Signed)
Report per EMS. Pt reports right sided pain that started this morning. States it goes from his shoulder to his toes. Reports pain is the worst in his right flank area. Hx pain and kidney stones.

## 2021-08-14 NOTE — ED Provider Notes (Signed)
[facility] Provider Note    Event Date/Time   First MD Initiated Contact with Patient 08/14/21 1943     (approximate)   History   Flank Pain   HPI  Peter Robertson is a 81 y.o. male patient reports right-sided pain starting this morning.  He said is from his right shoulder to his right toe.  When I saw him somewhat later he said he has pain in his right shoulder with movement.  He does not have any pain in right upper back or chest.  He has some pain in the right CVA area and the right upper quadrant and epigastric area.  He does not have any pain in his hips or leg.  He has a known kidney stone but I explained to him that it could be that is now trying to move causing pain.      Physical Exam   Triage Vital Signs: ED Triage Vitals  Enc Vitals Group     BP 08/14/21 1942 (!) 196/86     Pulse Rate 08/14/21 1935 78     Resp 08/14/21 1937 20     Temp 08/14/21 1934 97.6 F (36.4 C)     Temp Source 08/14/21 1934 Oral     SpO2 08/14/21 1937 99 %     Weight 08/14/21 1938 180 lb (81.6 kg)     Height 08/14/21 1938 5\' 6"  (1.676 m)     Head Circumference --      Peak Flow --      Pain Score 08/14/21 1936 9     Pain Loc --      Pain Edu? --      Excl. in [place]? --     Most recent vital signs: Vitals:   08/14/21 2130 08/14/21 2230  BP: (!) 166/107 (!) 173/128  Pulse: 76 74  Resp: 16 16  Temp:    SpO2: 98% 98%    General: Awake, no distress.  Head normocephalic atraumatic Ears: Left TM is clear right has some wax in it.  Patient says the wax is bothering him.  Asked me to get it out.  I take a cerumen spoon which took about 10 minutes to find because they were buried in the supply room and scooped out a good bit of cerumen.  TM is now clear and ear canal looks good.  Patient thanked me for this. CV:  Good peripheral perfusion.  Heart regular rate and rhythm no audible murmurs Resp:  Normal effort.  Lungs are clear Abd:  No distention.  There is some  tenderness in the right upper quadrant and epigastric area to palpation and some tenderness in the right CVA area to palpation percussion. Other:  Patient with bilateral leg edema.  Patient with pain in the right shoulder with palpation and movement.  Normal arm distally.   ED Results / Procedures / Treatments   Labs (all labs ordered are listed, but only abnormal results are displayed) Labs Reviewed  COMPREHENSIVE METABOLIC PANEL - Abnormal; Notable for the following components:      Result Value   Glucose, Bld 148 (*)    BUN 34 (*)    Creatinine, Ser 1.55 (*)    Calcium 8.8 (*)    GFR, Estimated 45 (*)    All other components within normal limits  URINALYSIS, ROUTINE W REFLEX MICROSCOPIC - Abnormal; Notable for the following components:   Color, Urine YELLOW (*)    APPearance CLEAR (*)  Glucose, UA 50 (*)    Protein, ur 100 (*)    All other components within normal limits  CBC WITH DIFFERENTIAL/PLATELET - Abnormal; Notable for the following components:   RBC 4.09 (*)    Hemoglobin 11.1 (*)    HCT 34.1 (*)    Platelets 104 (*)    Lymphs Abs 0.6 (*)    All other components within normal limits  LACTIC ACID, PLASMA  LACTIC ACID, PLASMA     EKG     RADIOLOGY Shoulder x-ray read by radiology and the films were reviewed by me shows 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  No fractures. CT of the abdomen shows a small right pleural effusion with some plaques consistent with asbestos exposure otherwise no acute problems.  Patient does have a nonobstructing renal stone.  It is on the left though.  PROCEDURES:  Critical Care performed:   Procedures   MEDICATIONS ORDERED IN ED: Medications  labetalol (NORMODYNE) injection 10 mg (10 mg Intravenous Given 08/14/21 2048)     IMPRESSION / MDM / ASSESSMENT AND PLAN / ED COURSE  I reviewed the triage vital signs and the nursing notes. 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  Do  not see anything in the stomach that could be causing a problem.  His pleural effusion will need following up.  It is small however and I do not think it needs to be tapped today.  It would have to be done under ultrasound guidance I think. Patient's GFR is low but this has been the case at least since last year.  Patient's urine is clear.  His lactic acid is negative. Patient does not have any life-threatening or apparently serious cause to his shoulder pain or flank pain.  I will let him go and have him follow-up with his doctor.  The patient is on the cardiac monitor to evaluate for evidence of arrhythmia and/or significant heart rate changes.  None have been seen I did consider admission but I cannot find a reason to do so.     FINAL CLINICAL IMPRESSION(S) / ED DIAGNOSES   Final diagnoses:  Right flank pain  Right shoulder pain, unspecified chronicity     Rx / DC Orders   ED Discharge Orders     None        Note:  This document was prepared using Dragon voice recognition software and may include unintentional dictation errors.   Nena Polio, MD 08/14/21 [phone]

## 2021-08-14 NOTE — ED Notes (Signed)
Pt given a urinal.

## 2021-08-15 NOTE — ED Notes (Signed)
Spoke with [employer] to provide transportation for patient. States it would approx be 1 hour.

## 2021-08-15 NOTE — ED Notes (Signed)
This RN called & spoke with POA, Lorinda Creed at [phone] & answered all questions.

## 2021-08-15 NOTE — ED Notes (Signed)
Attempted to call pt's caregiver and pt's brother with numbers provided in previous notes, unsuccessful with both and left a voicemail on both at this time.

## 2021-08-15 NOTE — ED Notes (Signed)
Attempted to call pt's neighbor [name], message went straight to voicemail at this time.

## 2021-08-15 NOTE — ED Notes (Signed)
Pt wheeled out to the [place] & taxi voucher with copy given to Sam, Therapist, sports (first nurse).

## 2021-08-15 NOTE — ED Notes (Addendum)
This RN tried calling Alondra Sahni ( pts brother) at [phone] to see if he can pick up pt. Phone line states call is unable to be connected. Not able to leave voicemail. Will try again in a a few hours.

## 2021-08-15 NOTE — ED Notes (Signed)
This RN called [name] (pts caregiver) again at [phone] to pick up pt from ED. No answer. Voicemail left.

## 2021-08-15 NOTE — ED Notes (Signed)
This RN tried calling Peter Robertson) pts brother again. States phone is unable to connect and leave voicemail.

## 2021-08-15 NOTE — ED Notes (Addendum)
Pt discharge information reviewed. Pt understands need for follow up care and when to return if symptoms worsen. All questions answered. Pt is alert and oriented with even and regular respirations. [name] (pts caregiver) at [phone] is called to see if she can pt up pt from ER. Phone went straight to voicemail. Voicemail left. Charge nurse notified of situation.

## 2021-08-15 NOTE — ED Notes (Signed)
Pt resting in bed. Appears to be sleeping at this time. Chest is rising and falling symmetrically. No acute distress noted. Will continue to monitor.   °

## 2021-08-16 LAB — COMPREHENSIVE METABOLIC PANEL
ALT: 18 U/L (ref 0–44)
AST: 21 U/L (ref 15–41)
Albumin: 3.8 g/dL (ref 3.5–5.0)
Alkaline Phosphatase: 82 U/L (ref 38–126)
Anion gap: 10 (ref 5–15)
BUN: 34 mg/dL — ABNORMAL HIGH (ref 8–23)
CO2: 28 mmol/L (ref 22–32)
Calcium: 8.8 mg/dL — ABNORMAL LOW (ref 8.9–10.3)
Chloride: 101 mmol/L (ref 98–111)
Creatinine, Ser: 1.55 mg/dL — ABNORMAL HIGH (ref 0.61–1.24)
GFR, Estimated: 45 mL/min — ABNORMAL LOW (ref >60)
Glucose, Bld: 167 mg/dL — ABNORMAL HIGH (ref 70–99)
Potassium: 3.9 mmol/L (ref 3.5–5.1)
Sodium: 139 mmol/L (ref 135–145)
Total Bilirubin: 0.5 mg/dL (ref 0.3–1.2)
Total Protein: 7.1 g/dL (ref 6.5–8.1)

## 2021-09-13 DIAGNOSIS — I5030 Unspecified diastolic (congestive) heart failure: Secondary | ICD-10-CM | POA: Insufficient documentation

## 2022-06-30 ENCOUNTER — Emergency Department
Admission: EM | Admit: 2022-06-30 | Discharge: 2022-06-30 | Disposition: A | Discharge status: Home / Self Care | Payer: Medicare HMO | Attending: Emergency Medicine | Admitting: Emergency Medicine

## 2022-06-30 ENCOUNTER — Emergency Department: Payer: Medicare HMO

## 2022-06-30 ENCOUNTER — Other Ambulatory Visit: Payer: Self-pay

## 2022-06-30 DIAGNOSIS — Z79899 Other long term (current) drug therapy: Secondary | ICD-10-CM | POA: Insufficient documentation

## 2022-06-30 DIAGNOSIS — E1122 Type 2 diabetes mellitus with diabetic chronic kidney disease: Secondary | ICD-10-CM | POA: Insufficient documentation

## 2022-06-30 DIAGNOSIS — M79671 Pain in right foot: Secondary | ICD-10-CM

## 2022-06-30 DIAGNOSIS — M545 Low back pain, unspecified: Secondary | ICD-10-CM | POA: Insufficient documentation

## 2022-06-30 DIAGNOSIS — M7989 Other specified soft tissue disorders: Secondary | ICD-10-CM | POA: Insufficient documentation

## 2022-06-30 DIAGNOSIS — Z794 Long term (current) use of insulin: Secondary | ICD-10-CM | POA: Diagnosis not present

## 2022-06-30 DIAGNOSIS — I2510 Atherosclerotic heart disease of native coronary artery without angina pectoris: Secondary | ICD-10-CM | POA: Diagnosis not present

## 2022-06-30 DIAGNOSIS — I129 Hypertensive chronic kidney disease with stage 1 through stage 4 chronic kidney disease, or unspecified chronic kidney disease: Secondary | ICD-10-CM | POA: Diagnosis not present

## 2022-06-30 DIAGNOSIS — J45909 Unspecified asthma, uncomplicated: Secondary | ICD-10-CM | POA: Insufficient documentation

## 2022-06-30 DIAGNOSIS — M79672 Pain in left foot: Secondary | ICD-10-CM | POA: Diagnosis not present

## 2022-06-30 DIAGNOSIS — N189 Chronic kidney disease, unspecified: Secondary | ICD-10-CM | POA: Insufficient documentation

## 2022-06-30 DIAGNOSIS — Z7902 Long term (current) use of antithrombotics/antiplatelets: Secondary | ICD-10-CM | POA: Diagnosis not present

## 2022-06-30 DIAGNOSIS — Z951 Presence of aortocoronary bypass graft: Secondary | ICD-10-CM | POA: Diagnosis not present

## 2022-06-30 MED ORDER — KETOROLAC TROMETHAMINE 60 MG/2ML IM SOLN
30.0000 mg | Freq: Once | INTRAMUSCULAR | Status: AC
  Administered 2022-06-30: 30 mg via INTRAMUSCULAR
  Filled 2022-06-30: qty 2

## 2022-06-30 MED ORDER — HYDROCODONE-ACETAMINOPHEN 5-325 MG PO TABS: 1.0000 | ORAL_TABLET | Freq: Four times a day (QID) | ORAL | 0 refills | Status: DC | PRN

## 2022-06-30 MED ORDER — HYDROCODONE-ACETAMINOPHEN 5-325 MG PO TABS
1.0000 | ORAL_TABLET | Freq: Once | ORAL | Status: AC
  Administered 2022-06-30: 1 via ORAL
  Filled 2022-06-30: qty 1

## 2022-06-30 NOTE — Discharge Instructions (Signed)
You may take Norco as needed for pain.  Wear podiatric shoe as needed for comfort.  Make sure to use your walker to help with balance when you walk.  Return to the ER for worsening symptoms, persistent vomiting, difficulty breathing or other concerns.

## 2022-06-30 NOTE — ED Provider Notes (Signed)
[facility] Provider Note    Event Date/Time   First MD Initiated Contact with Patient 06/30/22 [phone]     (approximate)   History   Leg pain   HPI  Peter Robertson is a 82 y.o. male brought to the ED via EMS from home with a chief complaint of acute on chronic leg pain.  Patient reports left foot and bilateral leg pain times several months.  Ambulates with both a cane as well as a walker.  Fell several nights ago secondary to pain.  Denies striking head or LOC.  Denies neck pain or headache.  Denies vision changes.  Has been ambulating since.  Endorses pain to the ball of his left foot as well as bilateral leg pain to the hips.  Also reports lower back pain for the same length of time.  Denies chest pain, shortness of breath, abdominal pain, nausea, vomiting or dizziness.     Past Medical History   Past Medical History:  Diagnosis Date   Asthma    BPH (benign prostatic hyperplasia)    Chronic kidney disease    Kidney Stones   Coronary artery disease    Diabetes mellitus without complication (HCC)    Heart murmur    Hyperlipidemia    Hypertension    Myocardial infarction [facility])      Active Problem List   Patient Active Problem List   Diagnosis Date Noted   Thyroid nodule greater than or equal to 1 cm in diameter incidentally noted on imaging study    Head injury    Weakness generalized    Acute on chronic diastolic CHF (congestive heart failure) ([place]) 05/07/2021   Acute respiratory failure with hypoxia ([place]) 05/07/2021   Aspiration pneumonia ([place]) 05/06/2021   Pneumonia due to COVID-19 virus 05/06/2021   Senile dementia without behavioral disturbance ([place]) 05/06/2021   Fall at home, initial encounter 05/06/2021   Hyperlipidemia    Hypertension    Elevated troponin    Chronic diastolic CHF (congestive heart failure) ([place])    Depression    CKD (chronic kidney disease), stage IIIa    Thyroid nodule    Fall    Encounter for psychological  evaluation 02/15/2020   AKI (acute kidney injury) ([place]) 02/12/2020   Vertigo 10/11/2019   BPPV (benign paroxysmal positional vertigo) 10/10/2019   CAD (coronary artery disease) 10/10/2019   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/10/2019   BPH (benign prostatic hyperplasia) 10/10/2019   Thrombocytopenia ([place]) 10/10/2019   Dark stools 10/10/2019   Unstable angina ([place]) 05/20/2018   Chest pain 01/30/2017     Past Surgical History   Past Surgical History:  Procedure Laterality Date   COLONOSCOPY WITH PROPOFOL N/A 12/03/2014   Procedure: COLONOSCOPY WITH PROPOFOL;  Surgeon: Manya Silvas, MD;  Location: [facility] ENDOSCOPY;  Service: Endoscopy;  Laterality: N/A;   CORONARY ARTERY BYPASS GRAFT     LEFT HEART CATH AND CORS/GRAFTS ANGIOGRAPHY N/A 05/22/2018   Procedure: LEFT HEART CATH AND CORS/GRAFTS ANGIOGRAPHY poss PCI;  Surgeon: Minna Merritts, MD;  Location: [facility] CV LAB;  Service: Cardiovascular;  Laterality: N/A;     Home Medications   Prior to Admission medications   Medication Sig Start Date End Date Taking? Authorizing Provider  acetaminophen (TYLENOL) 325 MG tablet Take 2 tablets (650 mg total) by mouth every 6 (six) hours as needed for mild pain or fever. 05/17/21   Richarda Osmond, MD  alum & mag hydroxide-simeth (MAALOX/MYLANTA)  200-200-20 MG/5ML suspension Take 15 mLs by mouth every 6 (six) hours as needed for indigestion or heartburn. 05/23/21   Lorella Nimrod, MD  atorvastatin (LIPITOR) 40 MG tablet Take 40 mg by mouth daily before supper.    [provider]  cholecalciferol (VITAMIN D3) 25 MCG (1000 UT) tablet Take 1,000 Units by mouth daily.    [provider]  clopidogrel (PLAVIX) 75 MG tablet Take 75 mg by mouth daily. 08/15/19   [provider]  gabapentin (NEURONTIN) 100 MG capsule Take 100 mg by mouth at bedtime.    [provider]  insulin glargine (LANTUS) 100 UNIT/ML injection Inject 0.3 mLs (30  Units total) into the skin daily. 05/17/21   Richarda Osmond, MD  Melatonin 10 MG CAPS Take 10 mg by mouth at bedtime as needed (sleep).    [provider]  metoprolol succinate (TOPROL-XL) 50 MG 24 hr tablet 50 mg daily. (Take with '100mg'$  tablet to equal '150mg'$  total)    [provider]  Multiple Vitamins-Minerals (PRESERVISION AREDS 2+MULTI VIT) CAPS Take 1 capsule by mouth 2 (two) times daily.    [provider]  nitroGLYCERIN (NITROSTAT) 0.4 MG SL tablet Place 0.4 mg under the tongue every 5 (five) minutes as needed for chest pain.     [provider]  pantoprazole (PROTONIX) 40 MG tablet Take 1 tablet (40 mg total) by mouth daily. 05/18/21   Richarda Osmond, MD  sertraline (ZOLOFT) 100 MG tablet Take 200 mg by mouth in the morning.    [provider]  tamsulosin (FLOMAX) 0.4 MG CAPS capsule Take 0.8 mg by mouth at bedtime.     [provider]  vitamin B-12 (CYANOCOBALAMIN) 1000 MCG tablet Take 1,000 mcg by mouth daily.    [provider]     Allergies  Sulfamethoxazole-trimethoprim and Tape   Family History   Family History  Problem Relation Age of Onset   Diabetes Mother    Cancer Mother    Hypertension Father    Hypertension Brother    Diabetes Brother      Physical Exam  Triage Vital Signs: ED Triage Vitals  Enc Vitals Group     BP      Pulse      Resp      Temp      Temp src      SpO2      Weight      Height      Head Circumference      Peak Flow      Pain Score      Pain Loc      Pain Edu?      Excl. in [place]?     Updated Vital Signs: BP (!) 187/96 (BP Location: Right Arm)   Pulse 83   Temp (!) 97.4 F (36.3 C) (Oral)   Resp 18   SpO2 98%    General: Awake, no distress.  CV:  Good peripheral perfusion.  Resp:  Normal effort.  Abd:  No distention.  Other:  No lumbar spine tenderness to palpation.  Pelvis is stable.  Full range of both hips, knees and ankles.  Ball of left foot  slightly reddened and tender to palpation.  Symmetrically warm limbs without evidence for ischemia.  2+ distal pulses.  Brisk, less than 5-second cap refill.   ED Results / Procedures / Treatments  Labs (all labs ordered are listed, but only abnormal results are displayed) Labs Reviewed - No  data to display   EKG  None   RADIOLOGY I have independently visualized and interpreted patient's x-rays as well as noted the radiology interpretation:  Pelvis: No acute fracture/dislocation  Left foot: Soft tissue swelling, no acute osseous abnormality  Right foot: Soft tissue swelling; no acute osseous abnormality  Lumbar spine: No acute osseous injury, chronic L1 compression fracture  Official radiology report(s): DG Foot Complete Right  Result Date: 06/30/2022 CLINICAL DATA:  82 year old male with pain. No known injury. EXAM: RIGHT FOOT COMPLETE - 3+ VIEW COMPARISON:  None Available. FINDINGS: Bone mineralization is within normal limits for age. Calcaneus degenerative spurring is mild. Joint spaces and alignment are within normal limits for age. No fracture or dislocation identified. There does appear to be distal foot soft tissue swelling at the metatarsal level. No soft tissue gas. No radiopaque foreign body identified. IMPRESSION: Soft tissue swelling but no acute osseous abnormality identified about the right foot. Electronically Signed   By: Genevie Ann M.D.   On: 06/30/2022 05:36   DG Pelvis 1-2 Views  Result Date: 06/30/2022 CLINICAL DATA:  82 year old male with pain. No known injury. EXAM: PELVIS - 1-2 VIEW COMPARISON:  Lumbar spine study today. CT Abdomen and Pelvis 08/14/2021. FINDINGS: Bone mineralization is within normal limits for age. Femoral heads remain normally located. Hip joint spaces appear stable, within normal limits for age. Grossly intact proximal femurs. Pelvis appears stable and intact. Left superior iliac wing not entirely included on this image. SI joints appear  symmetric. Negative visible bowel gas pattern. IMPRESSION: No acute osseous abnormality identified about the pelvis. If there is lateralizing hip pain then dedicated hip series is recommended. Electronically Signed   By: Genevie Ann M.D.   On: 06/30/2022 05:28   DG Foot Complete Left  Result Date: 06/30/2022 CLINICAL DATA:  82 year old male with pain. No known injury. EXAM: LEFT FOOT - COMPLETE 3+ VIEW COMPARISON:  Left foot series 01/29/2012. FINDINGS: Bone mineralization is within normal limits for age. Chronic calcaneus degenerative spurring is stable. 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 And increased 1st TMT osteoarthritis also. Other joint spaces and alignment are within normal limits for age. No fracture, dislocation, acute osseous abnormality identified. But there is soft tissue swelling in the distal foot including the dorsum on the lateral view. No soft tissue gas. No radiopaque foreign body identified. IMPRESSION: 1. Left foot soft tissue swelling. But no acute fracture or dislocation identified. 2. Progressed 1st ray arthritis since 2013. Electronically Signed   By: Genevie Ann M.D.   On: 06/30/2022 05:27   DG Lumbar Spine Complete  Result Date: 06/30/2022 CLINICAL DATA:  82 year old male with pain.  No known injury. EXAM: LUMBAR SPINE - COMPLETE 4+ VIEW COMPARISON:  Lumbar radiographs 04/07/2019. FINDINGS: Normal lumbar segmentation. Chronic L1 compression fracture is stable. Other lumbar and visible lower thoracic vertebral height and alignment appears stable from CT Abdomen and Pelvis 08/14/2021. Stable disc spaces. Some vacuum disc demonstrated on the CT last year. No acute osseous abnormality identified. Numerous nodular calcifications in the upper abdomen are in part related to calcified pleural disease at the lung bases. Nonobstructed visible bowel gas pattern. IMPRESSION: 1. Chronic L1 compression fracture. No acute osseous abnormality identified in the lumbar spine. 2.  Calcified pleural asbestos disease. Electronically Signed   By: Genevie Ann M.D.   On: 06/30/2022 05:19     PROCEDURES:  Critical Care performed: No  Procedures   MEDICATIONS ORDERED IN ED: Medications  ketorolac (TORADOL) injection  30 mg (30 mg Intramuscular Given 06/30/22 0506)  HYDROcodone-acetaminophen (NORCO/VICODIN) 5-325 MG per tablet 1 tablet (1 tablet Oral Given 06/30/22 0506)     IMPRESSION / MDM / ASSESSMENT AND PLAN / ED COURSE  I reviewed the triage vital signs and the nursing notes.                             82 year old male presenting with acute on chronic extremity pain.  Differential diagnosis includes but is not limited to fracture, dislocation, osteoarthritis, musculoskeletal, etc.  I have personally reviewed patient's records and note a PCP office visit for wound check, squamous cell carcinoma and 06/05/2022.  He has also been seen for neuropathy.  Patient's presentation is most consistent with acute presentation with potential threat to life or bodily function.  Will administer anti-inflammatory and analgesics; obtain plain film imaging of pelvis, lumbar spine and left foot.  Will reassess.  Clinical Course as of 06/30/22 4332  Fri Jun 30, 2022  0544 Updated patient on x-rays demonstrating no acute osseous injury.  Patient states right foot feels worse.  Will place podiatric shoe on right foot and perform ambulation trial with walker. [JS]  [phone] Patient ambulated well with podiatric shoe on the right foot and using walker.  Will discharge home with as needed prescription for Norco and patient will follow-up closely with his PCP.  Strict return precautions given.  Patient verbalizes understanding and agrees with plan of care. [JS]    Clinical Course User Index [JS] Paulette Blanch, MD     FINAL CLINICAL IMPRESSION(S) / ED DIAGNOSES   Final diagnoses:  Foot pain, bilateral  Foot swelling     Rx / DC Orders   ED Discharge Orders     None        Note:   This document was prepared using Dragon voice recognition software and may include unintentional dictation errors.   Paulette Blanch, MD 06/30/22 [phone]

## 2022-06-30 NOTE — ED Notes (Signed)
The pt was assisted with changing his depend and removing his male purwick. The pt was assisted with sitting on the side of the bed and putting on his pants. The pt was assisted to his feet. The pt appeared to be unsteady on his feet and was leaning backwards. The pt assisted to a sitting position on the bed. The pt was assisted again to a standing position and then placed in a wheel chair. The pt advised he was okay and wanted to go home. The pt was wheeled to his car and able to stand with not as much assistance to get into the vehicle. The pt was placed in the vehicle without incident.

## 2022-06-30 NOTE — ED Notes (Signed)
Patient able to ambulate with walker 30 ft while wearing post op Shoe to R foot and full weight bearing.

## 2022-09-10 DIAGNOSIS — N135 Crossing vessel and stricture of ureter without hydronephrosis: Secondary | ICD-10-CM | POA: Insufficient documentation

## 2022-11-18 ENCOUNTER — Inpatient Hospital Stay
Admission: EM | Admit: 2022-11-18 | Discharge: 2022-11-27 | DRG: 592 | Disposition: A | Discharge status: Skilled Nursing Facility | Payer: Medicare HMO | Attending: Internal Medicine | Admitting: Internal Medicine

## 2022-11-18 ENCOUNTER — Emergency Department: Payer: Medicare HMO

## 2022-11-18 ENCOUNTER — Other Ambulatory Visit: Payer: Self-pay

## 2022-11-18 DIAGNOSIS — Z79899 Other long term (current) drug therapy: Secondary | ICD-10-CM

## 2022-11-18 DIAGNOSIS — Z87891 Personal history of nicotine dependence: Secondary | ICD-10-CM

## 2022-11-18 DIAGNOSIS — E1165 Type 2 diabetes mellitus with hyperglycemia: Secondary | ICD-10-CM | POA: Diagnosis present

## 2022-11-18 DIAGNOSIS — L8993 Pressure ulcer of unspecified site, stage 3: Secondary | ICD-10-CM | POA: Diagnosis not present

## 2022-11-18 DIAGNOSIS — Z91048 Other nonmedicinal substance allergy status: Secondary | ICD-10-CM

## 2022-11-18 DIAGNOSIS — I2510 Atherosclerotic heart disease of native coronary artery without angina pectoris: Secondary | ICD-10-CM | POA: Diagnosis present

## 2022-11-18 DIAGNOSIS — L89152 Pressure ulcer of sacral region, stage 2: Secondary | ICD-10-CM

## 2022-11-18 DIAGNOSIS — Z7189 Other specified counseling: Secondary | ICD-10-CM | POA: Diagnosis not present

## 2022-11-18 DIAGNOSIS — R296 Repeated falls: Secondary | ICD-10-CM | POA: Diagnosis present

## 2022-11-18 DIAGNOSIS — I130 Hypertensive heart and chronic kidney disease with heart failure and stage 1 through stage 4 chronic kidney disease, or unspecified chronic kidney disease: Secondary | ICD-10-CM | POA: Diagnosis present

## 2022-11-18 DIAGNOSIS — N1831 Chronic kidney disease, stage 3a: Secondary | ICD-10-CM | POA: Diagnosis present

## 2022-11-18 DIAGNOSIS — N1330 Unspecified hydronephrosis: Secondary | ICD-10-CM | POA: Diagnosis present

## 2022-11-18 DIAGNOSIS — L8994 Pressure ulcer of unspecified site, stage 4: Secondary | ICD-10-CM | POA: Diagnosis not present

## 2022-11-18 DIAGNOSIS — Z66 Do not resuscitate: Secondary | ICD-10-CM | POA: Diagnosis not present

## 2022-11-18 DIAGNOSIS — Z833 Family history of diabetes mellitus: Secondary | ICD-10-CM

## 2022-11-18 DIAGNOSIS — Z794 Long term (current) use of insulin: Secondary | ICD-10-CM | POA: Diagnosis not present

## 2022-11-18 DIAGNOSIS — L89890 Pressure ulcer of other site, unstageable: Secondary | ICD-10-CM | POA: Diagnosis present

## 2022-11-18 DIAGNOSIS — R54 Age-related physical debility: Secondary | ICD-10-CM | POA: Diagnosis present

## 2022-11-18 DIAGNOSIS — Z6821 Body mass index (BMI) 21.0-21.9, adult: Secondary | ICD-10-CM

## 2022-11-18 DIAGNOSIS — R161 Splenomegaly, not elsewhere classified: Secondary | ICD-10-CM | POA: Diagnosis present

## 2022-11-18 DIAGNOSIS — I480 Paroxysmal atrial fibrillation: Secondary | ICD-10-CM | POA: Diagnosis present

## 2022-11-18 DIAGNOSIS — I4891 Unspecified atrial fibrillation: Secondary | ICD-10-CM | POA: Insufficient documentation

## 2022-11-18 DIAGNOSIS — Z7901 Long term (current) use of anticoagulants: Secondary | ICD-10-CM | POA: Diagnosis not present

## 2022-11-18 DIAGNOSIS — R64 Cachexia: Secondary | ICD-10-CM | POA: Diagnosis present

## 2022-11-18 DIAGNOSIS — I252 Old myocardial infarction: Secondary | ICD-10-CM

## 2022-11-18 DIAGNOSIS — E119 Type 2 diabetes mellitus without complications: Secondary | ICD-10-CM

## 2022-11-18 DIAGNOSIS — N400 Enlarged prostate without lower urinary tract symptoms: Secondary | ICD-10-CM | POA: Diagnosis present

## 2022-11-18 DIAGNOSIS — X58XXXA Exposure to other specified factors, initial encounter: Secondary | ICD-10-CM | POA: Diagnosis present

## 2022-11-18 DIAGNOSIS — Z602 Problems related to living alone: Secondary | ICD-10-CM | POA: Diagnosis present

## 2022-11-18 DIAGNOSIS — L03317 Cellulitis of buttock: Secondary | ICD-10-CM | POA: Diagnosis not present

## 2022-11-18 DIAGNOSIS — L89153 Pressure ulcer of sacral region, stage 3: Principal | ICD-10-CM

## 2022-11-18 DIAGNOSIS — Z9181 History of falling: Secondary | ICD-10-CM

## 2022-11-18 DIAGNOSIS — E1122 Type 2 diabetes mellitus with diabetic chronic kidney disease: Secondary | ICD-10-CM | POA: Diagnosis present

## 2022-11-18 DIAGNOSIS — E43 Unspecified severe protein-calorie malnutrition: Secondary | ICD-10-CM | POA: Diagnosis present

## 2022-11-18 DIAGNOSIS — Z7902 Long term (current) use of antithrombotics/antiplatelets: Secondary | ICD-10-CM

## 2022-11-18 DIAGNOSIS — S37012A Minor contusion of left kidney, initial encounter: Secondary | ICD-10-CM

## 2022-11-18 DIAGNOSIS — L089 Local infection of the skin and subcutaneous tissue, unspecified: Secondary | ICD-10-CM | POA: Diagnosis present

## 2022-11-18 DIAGNOSIS — Z8679 Personal history of other diseases of the circulatory system: Secondary | ICD-10-CM

## 2022-11-18 DIAGNOSIS — I5032 Chronic diastolic (congestive) heart failure: Secondary | ICD-10-CM | POA: Diagnosis present

## 2022-11-18 DIAGNOSIS — Z515 Encounter for palliative care: Secondary | ICD-10-CM | POA: Diagnosis not present

## 2022-11-18 DIAGNOSIS — Z8249 Family history of ischemic heart disease and other diseases of the circulatory system: Secondary | ICD-10-CM

## 2022-11-18 DIAGNOSIS — E785 Hyperlipidemia, unspecified: Secondary | ICD-10-CM | POA: Diagnosis present

## 2022-11-18 DIAGNOSIS — Z8673 Personal history of transient ischemic attack (TIA), and cerebral infarction without residual deficits: Secondary | ICD-10-CM

## 2022-11-18 DIAGNOSIS — Z951 Presence of aortocoronary bypass graft: Secondary | ICD-10-CM

## 2022-11-18 DIAGNOSIS — F0390 Unspecified dementia without behavioral disturbance: Secondary | ICD-10-CM | POA: Diagnosis present

## 2022-11-18 DIAGNOSIS — Z882 Allergy status to sulfonamides status: Secondary | ICD-10-CM

## 2022-11-18 DIAGNOSIS — Z751 Person awaiting admission to adequate facility elsewhere: Secondary | ICD-10-CM

## 2022-11-18 LAB — CBC WITH DIFFERENTIAL/PLATELET
Abs Immature Granulocytes: 0.05 10*3/uL (ref 0.00–0.07)
Basophils Absolute: 0 10*3/uL (ref 0.0–0.1)
Basophils Relative: 0 %
Eosinophils Absolute: 0.1 10*3/uL (ref 0.0–0.5)
Eosinophils Relative: 1 %
HCT: 29.8 % — ABNORMAL LOW (ref 39.0–52.0)
Hemoglobin: 9.2 g/dL — ABNORMAL LOW (ref 13.0–17.0)
Immature Granulocytes: 0 %
Lymphocytes Relative: 3 %
Lymphs Abs: 0.4 10*3/uL — ABNORMAL LOW (ref 0.7–4.0)
MCH: 24.4 pg — ABNORMAL LOW (ref 26.0–34.0)
MCHC: 30.9 g/dL (ref 30.0–36.0)
MCV: 79 fL — ABNORMAL LOW (ref 80.0–100.0)
Monocytes Absolute: 0.6 10*3/uL (ref 0.1–1.0)
Monocytes Relative: 5 %
Neutro Abs: 10.7 10*3/uL — ABNORMAL HIGH (ref 1.7–7.7)
Neutrophils Relative %: 91 %
Platelets: 154 10*3/uL (ref 150–400)
RBC: 3.77 MIL/uL — ABNORMAL LOW (ref 4.22–5.81)
RDW: 15.4 % (ref 11.5–15.5)
WBC: 11.8 10*3/uL — ABNORMAL HIGH (ref 4.0–10.5)
nRBC: 0 % (ref 0.0–0.2)

## 2022-11-18 LAB — COMPREHENSIVE METABOLIC PANEL
ALT: 11 U/L (ref 0–44)
AST: 13 U/L — ABNORMAL LOW (ref 15–41)
Albumin: 2.9 g/dL — ABNORMAL LOW (ref 3.5–5.0)
Alkaline Phosphatase: 83 U/L (ref 38–126)
Anion gap: 6 (ref 5–15)
BUN: 43 mg/dL — ABNORMAL HIGH (ref 8–23)
CO2: 23 mmol/L (ref 22–32)
Calcium: 8.5 mg/dL — ABNORMAL LOW (ref 8.9–10.3)
Chloride: 106 mmol/L (ref 98–111)
Creatinine, Ser: 1.45 mg/dL — ABNORMAL HIGH (ref 0.61–1.24)
GFR, Estimated: 48 mL/min — ABNORMAL LOW (ref >60)
Glucose, Bld: 234 mg/dL — ABNORMAL HIGH (ref 70–99)
Potassium: 3.7 mmol/L (ref 3.5–5.1)
Sodium: 135 mmol/L (ref 135–145)
Total Bilirubin: 0.7 mg/dL (ref 0.3–1.2)
Total Protein: 7 g/dL (ref 6.5–8.1)

## 2022-11-18 LAB — LACTIC ACID, PLASMA: Lactic Acid, Venous: 1.2 mmol/L (ref 0.5–1.9)

## 2022-11-18 MED ORDER — SODIUM CHLORIDE 0.9 % IV SOLN
1.0000 g | INTRAVENOUS | Status: AC
  Administered 2022-11-19 – 2022-11-22 (×4): 1 g via INTRAVENOUS
  Filled 2022-11-18 (×4): qty 10

## 2022-11-18 MED ORDER — ONDANSETRON HCL 4 MG/2ML IJ SOLN
4.0000 mg | Freq: Four times a day (QID) | INTRAMUSCULAR | Status: DC | PRN
  Filled 2022-11-18: qty 2

## 2022-11-18 MED ORDER — SODIUM CHLORIDE 0.9 % IV SOLN
2.0000 g | Freq: Once | INTRAVENOUS | Status: AC
  Administered 2022-11-18: 2 g via INTRAVENOUS
  Filled 2022-11-18: qty 20

## 2022-11-18 MED ORDER — OXYCODONE HCL 5 MG PO TABS
5.0000 mg | ORAL_TABLET | ORAL | Status: DC | PRN
  Administered 2022-11-19 – 2022-11-22 (×2): 5 mg via ORAL
  Filled 2022-11-18 (×2): qty 1

## 2022-11-18 MED ORDER — METOPROLOL SUCCINATE ER 50 MG PO TB24
100.0000 mg | ORAL_TABLET | Freq: Every day | ORAL | Status: DC
  Administered 2022-11-19 – 2022-11-27 (×9): 100 mg via ORAL
  Filled 2022-11-18 (×9): qty 2

## 2022-11-18 MED ORDER — ALBUTEROL SULFATE (2.5 MG/3ML) 0.083% IN NEBU: 2.5000 mg | INHALATION_SOLUTION | RESPIRATORY_TRACT | Status: DC | PRN

## 2022-11-18 MED ORDER — VANCOMYCIN HCL IN DEXTROSE 1-5 GM/200ML-% IV SOLN
1000.0000 mg | Freq: Once | INTRAVENOUS | Status: AC
  Administered 2022-11-18: 1000 mg via INTRAVENOUS
  Filled 2022-11-18: qty 200

## 2022-11-18 MED ORDER — IOHEXOL 300 MG/ML  SOLN
100.0000 mL | Freq: Once | INTRAMUSCULAR | Status: AC | PRN
  Administered 2022-11-18: 100 mL via INTRAVENOUS

## 2022-11-18 MED ORDER — PANTOPRAZOLE SODIUM 40 MG PO TBEC
40.0000 mg | DELAYED_RELEASE_TABLET | Freq: Every day | ORAL | Status: DC
  Administered 2022-11-19 – 2022-11-27 (×9): 40 mg via ORAL
  Filled 2022-11-18 (×9): qty 1

## 2022-11-18 MED ORDER — INSULIN GLARGINE-YFGN 100 UNIT/ML ~~LOC~~ SOLN
20.0000 [IU] | Freq: Every day | SUBCUTANEOUS | Status: DC
  Filled 2022-11-18: qty 0.2

## 2022-11-18 MED ORDER — ACETAMINOPHEN 650 MG RE SUPP: 650.0000 mg | Freq: Four times a day (QID) | RECTAL | Status: DC | PRN

## 2022-11-18 MED ORDER — ALBUTEROL SULFATE HFA 108 (90 BASE) MCG/ACT IN AERS: 1.0000 | INHALATION_SPRAY | RESPIRATORY_TRACT | Status: DC | PRN

## 2022-11-18 MED ORDER — ACETAMINOPHEN 325 MG PO TABS: 650.0000 mg | ORAL_TABLET | Freq: Four times a day (QID) | ORAL | Status: DC | PRN

## 2022-11-18 MED ORDER — SODIUM CHLORIDE 0.9 % IV BOLUS (SEPSIS)
1000.0000 mL | Freq: Once | INTRAVENOUS | Status: AC
  Administered 2022-11-18: 1000 mL via INTRAVENOUS

## 2022-11-18 MED ORDER — MORPHINE SULFATE (PF) 2 MG/ML IV SOLN
2.0000 mg | INTRAVENOUS | Status: DC | PRN
  Administered 2022-11-19: 2 mg via INTRAVENOUS
  Filled 2022-11-18 (×2): qty 1

## 2022-11-18 MED ORDER — FUROSEMIDE 20 MG PO TABS
20.0000 mg | ORAL_TABLET | Freq: Every day | ORAL | Status: DC
  Administered 2022-11-19 – 2022-11-27 (×9): 20 mg via ORAL
  Filled 2022-11-18 (×9): qty 1

## 2022-11-18 MED ORDER — ISOSORBIDE MONONITRATE ER 30 MG PO TB24
60.0000 mg | ORAL_TABLET | Freq: Every day | ORAL | Status: DC
  Administered 2022-11-19 – 2022-11-27 (×9): 60 mg via ORAL
  Filled 2022-11-18 (×4): qty 2
  Filled 2022-11-18: qty 1
  Filled 2022-11-18 (×4): qty 2

## 2022-11-18 MED ORDER — SERTRALINE HCL 50 MG PO TABS
50.0000 mg | ORAL_TABLET | Freq: Every day | ORAL | Status: DC
  Administered 2022-11-19 – 2022-11-27 (×9): 50 mg via ORAL
  Filled 2022-11-18 (×9): qty 1

## 2022-11-18 MED ORDER — NITROGLYCERIN 0.4 MG SL SUBL: 0.4000 mg | SUBLINGUAL_TABLET | SUBLINGUAL | Status: DC | PRN

## 2022-11-18 MED ORDER — IRBESARTAN 150 MG PO TABS
75.0000 mg | ORAL_TABLET | Freq: Every day | ORAL | Status: DC
  Administered 2022-11-19 – 2022-11-27 (×8): 75 mg via ORAL
  Filled 2022-11-18 (×9): qty 1

## 2022-11-18 MED ORDER — ATORVASTATIN CALCIUM 20 MG PO TABS
40.0000 mg | ORAL_TABLET | Freq: Every day | ORAL | Status: DC
  Administered 2022-11-19 – 2022-11-26 (×8): 40 mg via ORAL
  Filled 2022-11-18 (×8): qty 2

## 2022-11-18 MED ORDER — TAMSULOSIN HCL 0.4 MG PO CAPS
0.8000 mg | ORAL_CAPSULE | Freq: Every day | ORAL | Status: DC
  Administered 2022-11-19 – 2022-11-26 (×8): 0.8 mg via ORAL
  Filled 2022-11-18 (×8): qty 2

## 2022-11-18 MED ORDER — ONDANSETRON HCL 4 MG PO TABS: 4.0000 mg | ORAL_TABLET | Freq: Four times a day (QID) | ORAL | Status: DC | PRN

## 2022-11-18 NOTE — Assessment & Plan Note (Signed)
Chronic anticoagulation Holding Eliquis due to some concern for fluid collection possible hematoma around kidney.  To resume if H&H stable Continue metoprolol

## 2022-11-18 NOTE — Assessment & Plan Note (Signed)
Renal function at baseline 

## 2022-11-18 NOTE — Assessment & Plan Note (Signed)
Blood sugar 234 Continue basal insulin with sliding scale coverage

## 2022-11-18 NOTE — Assessment & Plan Note (Signed)
Clinically euvolemic Continue valsartan, metoprolol, isosorbide, Lasix Daily weights

## 2022-11-18 NOTE — Assessment & Plan Note (Addendum)
Subcapsular fluid collection left kidney of uncertain etiology BPH -CT abdomen and pelvis showing "subcapsular left renal fluid collection has peripheral enhancement and is heterogeneous measuring 6 x 6.5 x 6.5 cm. This is nonspecific, however suspicious for abscess" -Will hold Eliquis tonight given uncertainty whether fluid collection is hematoma.  Resume if hemoglobin stable - Urology consult for opinion -Serial H&H -Follow urinalysis to evaluate for possible UTI given abscess and differential of subcapsular fluid collection

## 2022-11-18 NOTE — Assessment & Plan Note (Signed)
Patient with purulent drainage from sacral decubitus ulcers Continue Rocephin and vancomycin Follow cultures Wound care consult Consider surgical consult for debridement

## 2022-11-18 NOTE — Progress Notes (Signed)
PHARMACY -  BRIEF ANTIBIOTIC NOTE   Pharmacy has received consult(s) for Vancomycin from an ED provider.  The patient's profile has been reviewed for ht/wt/allergies/indication/available labs.    One time order(s) placed for Vancomycin 1000 mg by ED provider.  Further antibiotics/pharmacy consults should be ordered by admitting physician if indicated.                       Thank you, Foye Deer 11/18/2022  9:34 PM
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
## 2022-11-18 NOTE — ED Provider Notes (Signed)
[facility] Provider Note    Event Date/Time   First MD Initiated Contact with Patient 11/18/22 2108     (approximate)   History   Chief Complaint: Wound Check   HPI  Peter Robertson is a 82 y.o. male with history of hypertension diabetes CKD BPH who comes ED complaining of pain on bilateral buttocks.  Gradual onset, worsening over the past several days.  Denies fever or chills.  Reports that he has "caretakers" who come to his house and help him with his medicines.     Physical Exam   Triage Vital Signs: ED Triage Vitals  Enc Vitals Group     BP 11/18/22 2051 (!) 142/76     Pulse Rate 11/18/22 2051 76     Resp 11/18/22 2051 (!) 23     Temp 11/18/22 2051 98.1 F (36.7 C)     Temp Source 11/18/22 2051 Oral     SpO2 11/18/22 2051 97 %     Weight 11/18/22 2051 132 lb 6.4 oz (60.1 kg)     Height 11/18/22 2051 5\' 6"  (1.676 m)     Head Circumference --      Peak Flow --      Pain Score 11/18/22 2049 1     Pain Loc --      Pain Edu? --      Excl. in GC? --     Most recent vital signs: Vitals:   11/18/22 2051 11/18/22 2230  BP: (!) 142/76 (!) 155/68  Pulse: 76 83  Resp: (!) 23 (!) 22  Temp: 98.1 F (36.7 C)   SpO2: 97% 94%    General: Awake, no distress.  CV:  Good peripheral perfusion.  Regular rate and rhythm Resp:  Normal effort.  Clear to auscultation bilaterally Abd:  No distention.  Soft nontender Other:  Posterior pelvis shows a 4 cm open wound on the right sacrum, stage III decubitus ulcer, with surrounding cellulitis and purulent drainage from the wound.  There is tunneling as well.  No crepitus.  On the left sacrum, there is a 5 cm area of cellulitis with central skin breakdown representing an unstageable decubitus wound.  No crepitus.   ED Results / Procedures / Treatments   Labs (all labs ordered are listed, but only abnormal results are displayed) Labs Reviewed  COMPREHENSIVE METABOLIC PANEL - Abnormal; Notable for the  following components:      Result Value   Glucose, Bld 234 (*)    BUN 43 (*)    Creatinine, Ser 1.45 (*)    Calcium 8.5 (*)    Albumin 2.9 (*)    AST 13 (*)    GFR, Estimated 48 (*)    All other components within normal limits  CBC WITH DIFFERENTIAL/PLATELET - Abnormal; Notable for the following components:   WBC 11.8 (*)    RBC 3.77 (*)    Hemoglobin 9.2 (*)    HCT 29.8 (*)    MCV 79.0 (*)    MCH 24.4 (*)    Neutro Abs 10.7 (*)    Lymphs Abs 0.4 (*)    All other components within normal limits  CULTURE, BLOOD (ROUTINE X 2)  CULTURE, BLOOD (ROUTINE X 2)  LACTIC ACID, PLASMA  URINALYSIS, W/ REFLEX TO CULTURE (INFECTION SUSPECTED)     EKG Interpreted by me Sinus rhythm rate of 84.  Normal axis, mildly prolonged QTc of 510 ms.  Right bundle branch block.  No acute ischemic changes.  RADIOLOGY Chest x-ray interpreted by me, unremarkable.  Radiology report reviewed.  CT abdomen pelvis shows fluid collection around left kidney consistent with recent kidney infection treated at [facility].  No complications from his sacral decub wounds.   PROCEDURES:  Procedures   MEDICATIONS ORDERED IN ED: Medications  sodium chloride 0.9 % bolus 1,000 mL (0 mLs Intravenous Stopped 11/18/22 2150)  vancomycin (VANCOCIN) IVPB 1000 mg/200 mL premix (1,000 mg Intravenous New Bag/Given 11/18/22 2225)  cefTRIAXone (ROCEPHIN) 2 g in sodium chloride 0.9 % 100 mL IVPB (0 g Intravenous Stopped 11/18/22 2257)  iohexol (OMNIPAQUE) 300 MG/ML solution 100 mL (100 mLs Intravenous Contrast Given 11/18/22 2202)     IMPRESSION / MDM / ASSESSMENT AND PLAN / ED COURSE  I reviewed the triage vital signs and the nursing notes.  DDx: Cellulitis, necrotizing fasciitis, AKI, electrolyte abnormality, dehydration  Patient's presentation is most consistent with acute presentation with potential threat to life or bodily function.  Patient presents with sacral pain, found to have bilateral sacral decub wounds which are  infected with clinically apparent cellulitis.  Due to his age and insulin-dependent diabetes, CT scan obtained to evaluate for deeper space infection.  No such findings on CT.  Vancomycin and ceftriaxone given along with fluid bolus.  He is not septic.  Will need to be hospitalized for IV antibiotics, wound/surgery consult.  Case discussed with the hospitalist for further management.       FINAL CLINICAL IMPRESSION(S) / ED DIAGNOSES   Final diagnoses:  Sacral decubitus ulcer, stage III (HCC)  Cellulitis of buttock  Sacral decubitus ulcer, stage II (HCC)  Type 2 diabetes mellitus without complication, with long-term current use of insulin (HCC)     Rx / DC Orders   ED Discharge Orders     None        Note:  This document was prepared using Dragon voice recognition software and may include unintentional dictation errors.   Sharman Cheek, MD 11/18/22 2326

## 2022-11-18 NOTE — ED Triage Notes (Signed)
Patient arrives by EMS after family called for a wound check.  They are concerned about infection.  Patient has an open pressure wound on his left buttock, and another wound forming on the right buttock.  Both areas are red.  It was noted that patient had a kidney infection approximately one month ago, and was released from rehab after a course of antibiotics.  Patient has edema on both feet, which is said to come and go.  Patient lives alone with family coming to check on him.  Patient says the wound areas burn.

## 2022-11-18 NOTE — Assessment & Plan Note (Signed)
No complaints of chest pain, EKG nonacute Continue nitroglycerin, metoprolol, Imdur, atorvastatin.  Not on antiplatelets likely because of chronic anticoagulation with apixaban

## 2022-11-18 NOTE — ED Notes (Signed)
Patient returned from CT

## 2022-11-18 NOTE — Assessment & Plan Note (Addendum)
History of frequent falls Chronic physical deconditioning Protein calorie malnutrition, unspecified Patient is frail-appearing, with muscle wasting PT, nutrition S and TOC consults--patient lives alone According to neighbor at bedside, patient does not walk around much of late

## 2022-11-19 DIAGNOSIS — S37012A Minor contusion of left kidney, initial encounter: Secondary | ICD-10-CM

## 2022-11-19 DIAGNOSIS — L8994 Pressure ulcer of unspecified site, stage 4: Secondary | ICD-10-CM | POA: Diagnosis not present

## 2022-11-19 DIAGNOSIS — L089 Local infection of the skin and subcutaneous tissue, unspecified: Secondary | ICD-10-CM

## 2022-11-19 DIAGNOSIS — L89153 Pressure ulcer of sacral region, stage 3: Secondary | ICD-10-CM | POA: Diagnosis not present

## 2022-11-19 DIAGNOSIS — N1330 Unspecified hydronephrosis: Secondary | ICD-10-CM

## 2022-11-19 LAB — URINALYSIS, W/ REFLEX TO CULTURE (INFECTION SUSPECTED)
Bacteria, UA: NONE SEEN
Bilirubin Urine: NEGATIVE
Glucose, UA: >=500 mg/dL — AB
Ketones, ur: NEGATIVE mg/dL
Nitrite: NEGATIVE
Protein, ur: 30 mg/dL — AB
Specific Gravity, Urine: 1.02 (ref 1.005–1.030)
Squamous Epithelial / HPF: NONE SEEN /HPF (ref 0–5)
WBC, UA: >50 WBC/hpf (ref 0–5)
pH: 5 (ref 5.0–8.0)

## 2022-11-19 LAB — CBC
HCT: 26.8 % — ABNORMAL LOW (ref 39.0–52.0)
Hemoglobin: 8.4 g/dL — ABNORMAL LOW (ref 13.0–17.0)
MCH: 24.5 pg — ABNORMAL LOW (ref 26.0–34.0)
MCHC: 31.3 g/dL (ref 30.0–36.0)
MCV: 78.1 fL — ABNORMAL LOW (ref 80.0–100.0)
Platelets: 133 10*3/uL — ABNORMAL LOW (ref 150–400)
RBC: 3.43 MIL/uL — ABNORMAL LOW (ref 4.22–5.81)
RDW: 15.4 % (ref 11.5–15.5)
WBC: 10.2 10*3/uL (ref 4.0–10.5)
nRBC: 0 % (ref 0.0–0.2)

## 2022-11-19 LAB — GLUCOSE, CAPILLARY
Glucose-Capillary: 111 mg/dL — ABNORMAL HIGH (ref 70–99)
Glucose-Capillary: 191 mg/dL — ABNORMAL HIGH (ref 70–99)

## 2022-11-19 LAB — BASIC METABOLIC PANEL
Anion gap: 8 (ref 5–15)
BUN: 37 mg/dL — ABNORMAL HIGH (ref 8–23)
CO2: 21 mmol/L — ABNORMAL LOW (ref 22–32)
Calcium: 8.2 mg/dL — ABNORMAL LOW (ref 8.9–10.3)
Chloride: 107 mmol/L (ref 98–111)
Creatinine, Ser: 1.32 mg/dL — ABNORMAL HIGH (ref 0.61–1.24)
GFR, Estimated: 54 mL/min — ABNORMAL LOW (ref >60)
Glucose, Bld: 205 mg/dL — ABNORMAL HIGH (ref 70–99)
Potassium: 3.4 mmol/L — ABNORMAL LOW (ref 3.5–5.1)
Sodium: 136 mmol/L (ref 135–145)

## 2022-11-19 LAB — CBG MONITORING, ED: Glucose-Capillary: 256 mg/dL — ABNORMAL HIGH (ref 70–99)

## 2022-11-19 LAB — CULTURE, BLOOD (ROUTINE X 2)

## 2022-11-19 MED ORDER — ACETAMINOPHEN 325 MG PO TABS
650.0000 mg | ORAL_TABLET | Freq: Four times a day (QID) | ORAL | Status: DC | PRN
  Administered 2022-11-21 – 2022-11-22 (×3): 650 mg via ORAL
  Filled 2022-11-19 (×3): qty 2

## 2022-11-19 MED ORDER — ALUM & MAG HYDROXIDE-SIMETH 200-200-20 MG/5ML PO SUSP
15.0000 mL | Freq: Four times a day (QID) | ORAL | Status: DC | PRN
  Administered 2022-11-26: 15 mL via ORAL
  Filled 2022-11-19: qty 30

## 2022-11-19 MED ORDER — ALBUTEROL SULFATE (2.5 MG/3ML) 0.083% IN NEBU: 2.5000 mg | INHALATION_SOLUTION | RESPIRATORY_TRACT | Status: DC | PRN

## 2022-11-19 MED ORDER — VANCOMYCIN HCL 500 MG/100ML IV SOLN
500.0000 mg | INTRAVENOUS | Status: DC
  Administered 2022-11-19: 500 mg via INTRAVENOUS
  Filled 2022-11-19: qty 100

## 2022-11-19 MED ORDER — SODIUM CHLORIDE 0.9 % IV SOLN: INTRAVENOUS | Status: DC | PRN

## 2022-11-19 MED ORDER — VANCOMYCIN HCL 750 MG/150ML IV SOLN
750.0000 mg | INTRAVENOUS | Status: DC
  Administered 2022-11-20: 750 mg via INTRAVENOUS
  Filled 2022-11-19: qty 150

## 2022-11-19 MED ORDER — INSULIN ASPART 100 UNIT/ML IJ SOLN
0.0000 [IU] | Freq: Three times a day (TID) | INTRAMUSCULAR | Status: DC
  Administered 2022-11-19 – 2022-11-20 (×2): 8 [IU] via SUBCUTANEOUS
  Administered 2022-11-20: 3 [IU] via SUBCUTANEOUS
  Administered 2022-11-20: 5 [IU] via SUBCUTANEOUS
  Administered 2022-11-21: 11 [IU] via SUBCUTANEOUS
  Administered 2022-11-21: 2 [IU] via SUBCUTANEOUS
  Administered 2022-11-21 – 2022-11-22 (×2): 5 [IU] via SUBCUTANEOUS
  Administered 2022-11-22: 3 [IU] via SUBCUTANEOUS
  Administered 2022-11-22 – 2022-11-23 (×2): 5 [IU] via SUBCUTANEOUS
  Administered 2022-11-23: 8 [IU] via SUBCUTANEOUS
  Administered 2022-11-23: 2 [IU] via SUBCUTANEOUS
  Administered 2022-11-24 (×2): 5 [IU] via SUBCUTANEOUS
  Administered 2022-11-24: 3 [IU] via SUBCUTANEOUS
  Administered 2022-11-25: 5 [IU] via SUBCUTANEOUS
  Administered 2022-11-25 – 2022-11-26 (×4): 3 [IU] via SUBCUTANEOUS
  Administered 2022-11-27 (×2): 2 [IU] via SUBCUTANEOUS
  Filled 2022-11-19 (×23): qty 1

## 2022-11-19 MED ORDER — APIXABAN 2.5 MG PO TABS: 2.5000 mg | ORAL_TABLET | Freq: Two times a day (BID) | ORAL | Status: DC

## 2022-11-19 MED ORDER — GABAPENTIN 300 MG PO CAPS
300.0000 mg | ORAL_CAPSULE | Freq: Two times a day (BID) | ORAL | Status: DC
  Administered 2022-11-19 (×2): 300 mg via ORAL
  Filled 2022-11-19 (×2): qty 1

## 2022-11-19 MED ORDER — INSULIN ASPART 100 UNIT/ML IJ SOLN
0.0000 [IU] | Freq: Every day | INTRAMUSCULAR | Status: DC
  Administered 2022-11-20 – 2022-11-25 (×4): 2 [IU] via SUBCUTANEOUS
  Filled 2022-11-19 (×5): qty 1
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
## 2022-11-19 NOTE — Progress Notes (Signed)
Pharmacy Antibiotic Note  Peter Robertson is a 82 y.o. male w/ PMH of HFpEF (EF greater than 55% 07/2022), BPH, insulin requiring type 2 diabetes, dementia, CKD 3A, history of hydronephrosis requiring nephrostomy tubes (4/2 - 10/03/2022), HTN, CAD s/p CABG and paroxysmal A-fib on Eliquis admitted on 11/18/2022 with cellulitis.  Pharmacy has been consulted for vancomycin dosing  Plan: adjust vancomycin to 750 mg IV Q 24 hrs. Goal AUC 400-550. Expected AUC: 506.6 SCr used: 1.32 mg/dL Ke 6.045 h-1, W0/9 20.2h TBW 60.1 kg < IBW 63.8 kg  Pharmacy will continue to follow and will adjust abx dosing whenever warranted.  Temp (24hrs), Avg:98.2 F (36.8 C), Min:98 F (36.7 C), Max:98.5 F (36.9 C)   Recent Labs  Lab 11/18/22 2112 11/18/22 2113 11/19/22 0554  WBC  --  11.8* 10.2  CREATININE  --  1.45* 1.32*  LATICACIDVEN 1.2  --   --      Estimated Creatinine Clearance: 36 mL/min (A) (by C-G formula based on SCr of 1.32 mg/dL (H)).    Allergies  Allergen Reactions   Sulfamethoxazole-Trimethoprim Other (See Comments)    dizziness dizziness dizziness    Tape Rash    blisters    Antimicrobials this admission: 6/01 ceftriaxone >>  6/01 vancomycin >>   Microbiology results: 6/01 BCx: NG < 12 hours  Thank you for allowing pharmacy to be a part of this patient's care.  Burnis Medin, PharmD, BCPS  11/19/2022 2:40 PM

## 2022-11-19 NOTE — Plan of Care (Signed)

## 2022-11-19 NOTE — Progress Notes (Signed)
Placed a purewick, patient has a sacral pressure injury stage 3. Patient is incontinent.

## 2022-11-19 NOTE — Consult Note (Signed)
Urology Consult   Physician requesting consult: Lindajo Royal, MD  Reason for consult: Left subcapsular renal fluid collection, left hydronephrosis  History of Present Illness: Peter Robertson is a 82 y.o. seen in consultation from Dr. Para March for evaluation of a left subcapsular renal fluid collection and left hydronephrosis.  The patient has multiple medical problems and was admitted to the hospital last night with deconditioning and sacral decubitus ulcers concerning for possible wound infection.  He was recently admitted to [facility] for management of a left proximal ureteral calculus with obstruction.  He underwent placement of a left nephrostomy tube on 09/09/2022 for a 1 cm obstructing stone at the left UPJ with severe hydronephrosis.  He was septic at that time.  He subsequently underwent a left percutaneous nephrolithotomy by Dr. Al Corpus at [facility] on 09/25/2022.  The stone was removed at that time.  He did have a large left-sided renal hematoma postoperatively.  The left nephrostomy tube was removed prior to discharge. CT imaging from 11/18/2022 shows moderate left hydronephrosis with proximal hydroureter without obstructing stone, a subcapsular fluid collection about the left kidney with peripheral enhancement measuring 2.1 cm in thickness and 6 cm in length, dilation of the right renal pelvis and proximal ureter without obstructing stone. The patient denies any left-sided flank pain.  He reports that he has been voiding without difficulty. Urinalysis on admission showed 11-20 RBCs, >50 WBCs, no bacteria.  Urine culture pending.  Past Medical History:  Diagnosis Date   Asthma    BPH (benign prostatic hyperplasia)    Chronic kidney disease    Kidney Stones   Coronary artery disease    Diabetes mellitus without complication (HCC)    Heart murmur    Hyperlipidemia    Hypertension    Myocardial infarction [facility])     Past Surgical History:  Procedure Laterality Date   COLONOSCOPY WITH PROPOFOL  N/A 12/03/2014   Procedure: COLONOSCOPY WITH PROPOFOL;  Surgeon: Scot Jun, MD;  Location: [facility] ENDOSCOPY;  Service: Endoscopy;  Laterality: N/A;   CORONARY ARTERY BYPASS GRAFT     LEFT HEART CATH AND CORS/GRAFTS ANGIOGRAPHY N/A 05/22/2018   Procedure: LEFT HEART CATH AND CORS/GRAFTS ANGIOGRAPHY poss PCI;  Surgeon: Antonieta Iba, MD;  Location: [facility] INVASIVE CV LAB;  Service: Cardiovascular;  Laterality: N/A;    Medications:   Scheduled Meds:  atorvastatin  40 mg Oral QAC supper   furosemide  20 mg Oral Daily   gabapentin  300 mg Oral BID   insulin aspart  0-15 Units Subcutaneous TID WC   insulin aspart  0-5 Units Subcutaneous QHS   irbesartan  75 mg Oral Daily   isosorbide mononitrate  60 mg Oral Daily   metoprolol succinate  100 mg Oral Daily   pantoprazole  40 mg Oral Daily   sertraline  50 mg Oral Daily   tamsulosin  0.8 mg Oral QHS   Continuous Infusions:  cefTRIAXone (ROCEPHIN)  IV     [START ON 11/20/2022] vancomycin     PRN Meds:.acetaminophen, albuterol, alum & mag hydroxide-simeth, morphine injection, nitroGLYCERIN, ondansetron **OR** ondansetron (ZOFRAN) IV, oxyCODONE  Allergies:  Allergies  Allergen Reactions   Sulfamethoxazole-Trimethoprim Other (See Comments)    dizziness dizziness dizziness    Tape Rash    blisters    Family History  Problem Relation Age of Onset   Diabetes Mother    Cancer Mother    Hypertension Father    Hypertension Brother    Diabetes Brother  Social History:  reports that he quit smoking about 38 years ago. He has never used smokeless tobacco. He reports that he does not drink alcohol and does not use drugs.  ROS: A complete review of systems was performed.  All systems are negative except for pertinent findings as noted.  Physical Exam:  Vital signs in last 24 hours: Temp:  [97.5 F (36.4 C)-98.5 F (36.9 C)] 97.5 F (36.4 C) (06/02 1443) Pulse Rate:  [47-106] 76 (06/02 1443) Resp:  [13-23] 17 (06/02  1443) BP: (91-166)/(52-81) 91/52 (06/02 1443) SpO2:  [86 %-99 %] 99 % (06/02 1443) Weight:  [60.1 kg] 60.1 kg (06/01 2051) GENERAL APPEARANCE:  Elderly male lying in bed, NAD HEENT:  Atraumatic, normocephalic, oropharynx clear NECK:  Supple  ABDOMEN:  Soft, non-tender, no masses EXTREMITIES:  Moves all extremities well, without clubbing, cyanosis, or edema NEUROLOGIC:  Alert and oriented x 3, CN II-XII grossly intact MENTAL STATUS:  appropriate BACK:  Non-tender to palpation, No CVAT; left nephrostomy tube site well healed SKIN:  Warm, dry, and intact GU:  Condom catheter in place  Laboratory Data:  Recent Labs    11/18/22 2113 11/19/22 0554  WBC 11.8* 10.2  HGB 9.2* 8.4*  HCT 29.8* 26.8*  PLT 154 133*    Recent Labs    11/18/22 2113 11/19/22 0554  NA 135 136  K 3.7 3.4*  CL 106 107  GLUCOSE 234* 205*  BUN 43* 37*  CALCIUM 8.5* 8.2*  CREATININE 1.45* 1.32*     Results for orders placed or performed during the hospital encounter of 11/18/22 (from the past 24 hour(s))  Lactic acid, plasma     Status: None   Collection Time: 11/18/22  9:12 PM  Result Value Ref Range   Lactic Acid, Venous 1.2 0.5 - 1.9 mmol/L  Blood Culture (routine x 2)     Status: None (Preliminary result)   Collection Time: 11/18/22  9:12 PM   Specimen: BLOOD  Result Value Ref Range   Specimen Description BLOOD BLOOD LEFT ARM    Special Requests      BOTTLES DRAWN AEROBIC AND ANAEROBIC Blood Culture results may not be optimal due to an excessive volume of blood received in culture bottles   Culture      NO GROWTH < 12 HOURS Performed at [facility], [address] [place], [place] [address]    Report Status PENDING   Blood Culture (routine x 2)     Status: None (Preliminary result)   Collection Time: 11/18/22  9:12 PM   Specimen: BLOOD  Result Value Ref Range   Specimen Description BLOOD BLOOD RIGHT ARM    Special Requests      BOTTLES DRAWN AEROBIC AND ANAEROBIC Blood Culture  results may not be optimal due to an excessive volume of blood received in culture bottles   Culture      NO GROWTH < 12 HOURS Performed at [facility], [address] [place], [place] [address]    Report Status PENDING   Comprehensive metabolic panel     Status: Abnormal   Collection Time: 11/18/22  9:13 PM  Result Value Ref Range   Sodium 135 135 - 145 mmol/L   Potassium 3.7 3.5 - 5.1 mmol/L   Chloride 106 98 - 111 mmol/L   CO2 23 22 - 32 mmol/L   Glucose, Bld 234 (H) 70 - 99 mg/dL   BUN 43 (H) 8 - 23 mg/dL   Creatinine, Ser 0.98 (H)  0.61 - 1.24 mg/dL   Calcium 8.5 (L) 8.9 - 10.3 mg/dL   Total Protein 7.0 6.5 - 8.1 g/dL   Albumin 2.9 (L) 3.5 - 5.0 g/dL   AST 13 (L) 15 - 41 U/L   ALT 11 0 - 44 U/L   Alkaline Phosphatase 83 38 - 126 U/L   Total Bilirubin 0.7 0.3 - 1.2 mg/dL   GFR, Estimated 48 (L) >60 mL/min   Anion gap 6 5 - 15  CBC with Differential     Status: Abnormal   Collection Time: 11/18/22  9:13 PM  Result Value Ref Range   WBC 11.8 (H) 4.0 - 10.5 K/uL   RBC 3.77 (L) 4.22 - 5.81 MIL/uL   Hemoglobin 9.2 (L) 13.0 - 17.0 g/dL   HCT 96.0 (L) 45.4 - 09.8 %   MCV 79.0 (L) 80.0 - 100.0 fL   MCH 24.4 (L) 26.0 - 34.0 pg   MCHC 30.9 30.0 - 36.0 g/dL   RDW 11.9 14.7 - 82.9 %   Platelets 154 150 - 400 K/uL   nRBC 0.0 0.0 - 0.2 %   Neutrophils Relative % 91 %   Neutro Abs 10.7 (H) 1.7 - 7.7 K/uL   Lymphocytes Relative 3 %   Lymphs Abs 0.4 (L) 0.7 - 4.0 K/uL   Monocytes Relative 5 %   Monocytes Absolute 0.6 0.1 - 1.0 K/uL   Eosinophils Relative 1 %   Eosinophils Absolute 0.1 0.0 - 0.5 K/uL   Basophils Relative 0 %   Basophils Absolute 0.0 0.0 - 0.1 K/uL   Immature Granulocytes 0 %   Abs Immature Granulocytes 0.05 0.00 - 0.07 K/uL  Urinalysis, w/ Reflex to Culture (Infection Suspected) -Urine, Clean Catch     Status: Abnormal   Collection Time: 11/19/22  2:07 AM  Result Value Ref Range   Specimen Source URINE, CLEAN CATCH    Color, Urine YELLOW (A) YELLOW    APPearance HAZY (A) CLEAR   Specific Gravity, Urine 1.020 1.005 - 1.030   pH 5.0 5.0 - 8.0   Glucose, UA >=500 (A) NEGATIVE mg/dL   Hgb urine dipstick SMALL (A) NEGATIVE   Bilirubin Urine NEGATIVE NEGATIVE   Ketones, ur NEGATIVE NEGATIVE mg/dL   Protein, ur 30 (A) NEGATIVE mg/dL   Nitrite NEGATIVE NEGATIVE   Leukocytes,Ua LARGE (A) NEGATIVE   RBC / HPF 11-20 0 - 5 RBC/hpf   WBC, UA >50 0 - 5 WBC/hpf   Bacteria, UA NONE SEEN NONE SEEN   Squamous Epithelial / HPF NONE SEEN 0 - 5 /HPF   Budding Yeast PRESENT   CBC     Status: Abnormal   Collection Time: 11/19/22  5:54 AM  Result Value Ref Range   WBC 10.2 4.0 - 10.5 K/uL   RBC 3.43 (L) 4.22 - 5.81 MIL/uL   Hemoglobin 8.4 (L) 13.0 - 17.0 g/dL   HCT 56.2 (L) 13.0 - 86.5 %   MCV 78.1 (L) 80.0 - 100.0 fL   MCH 24.5 (L) 26.0 - 34.0 pg   MCHC 31.3 30.0 - 36.0 g/dL   RDW 78.4 69.6 - 29.5 %   Platelets 133 (L) 150 - 400 K/uL   nRBC 0.0 0.0 - 0.2 %  Basic metabolic panel     Status: Abnormal   Collection Time: 11/19/22  5:54 AM  Result Value Ref Range   Sodium 136 135 - 145 mmol/L   Potassium 3.4 (L) 3.5 - 5.1 mmol/L   Chloride 107 98 - 111  mmol/L   CO2 21 (L) 22 - 32 mmol/L   Glucose, Bld 205 (H) 70 - 99 mg/dL   BUN 37 (H) 8 - 23 mg/dL   Creatinine, Ser 1.61 (H) 0.61 - 1.24 mg/dL   Calcium 8.2 (L) 8.9 - 10.3 mg/dL   GFR, Estimated 54 (L) >60 mL/min   Anion gap 8 5 - 15  CBG monitoring, ED     Status: Abnormal   Collection Time: 11/19/22 11:50 AM  Result Value Ref Range   Glucose-Capillary 256 (H) 70 - 99 mg/dL   Recent Results (from the past 240 hour(s))  Blood Culture (routine x 2)     Status: None (Preliminary result)   Collection Time: 11/18/22  9:12 PM   Specimen: BLOOD  Result Value Ref Range Status   Specimen Description BLOOD BLOOD LEFT ARM  Final   Special Requests   Final    BOTTLES DRAWN AEROBIC AND ANAEROBIC Blood Culture results may not be optimal due to an excessive volume of blood received in culture bottles    Culture   Final    NO GROWTH < 12 HOURS Performed at [facility], [address], [place], [place] [address]    Report Status PENDING  Incomplete  Blood Culture (routine x 2)     Status: None (Preliminary result)   Collection Time: 11/18/22  9:12 PM   Specimen: BLOOD  Result Value Ref Range Status   Specimen Description BLOOD BLOOD RIGHT ARM  Final   Special Requests   Final    BOTTLES DRAWN AEROBIC AND ANAEROBIC Blood Culture results may not be optimal due to an excessive volume of blood received in culture bottles   Culture   Final    NO GROWTH < 12 HOURS Performed at [facility], [address] [place], [place] [address]    Report Status PENDING  Incomplete    Renal Function: Recent Labs    11/18/22 2113 11/19/22 0554  CREATININE 1.45* 1.32*   Estimated Creatinine Clearance: 36 mL/min (A) (by C-G formula based on SCr of 1.32 mg/dL (H)).  Radiologic Imaging: CT ABDOMEN PELVIS W CONTRAST  Result Date: 11/18/2022 CLINICAL DATA:  82 year old with abdominal pain. Infected sacral decubitus wounds. EXAM: CT ABDOMEN AND PELVIS WITH CONTRAST TECHNIQUE: Multidetector CT imaging of the abdomen and pelvis was performed using the standard protocol following bolus administration of intravenous contrast. RADIATION DOSE REDUCTION: This exam was performed according to the departmental dose-optimization program which includes automated exposure control, adjustment of the mA and/or kV according to patient size and/or use of iterative reconstruction technique. 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 Previous right pleural effusion has near completely resolved. Hepatobiliary: No focal liver abnormality is seen. No gallstones, gallbladder wall thickening, or biliary dilatation. Pancreas: Parenchymal atrophy. Coarse calcifications in the  pancreatic head. No ductal dilatation or inflammation. Spleen: Enlarged, the spleen measures 15 x 10 x 13.2 cm (volume = 1000 cm^3). This is chronic. Tiny hypodensity superiorly is nonspecific. Adrenals/Urinary Tract: No adrenal nodule. Moderate left hydronephrosis with proximal hydroureter. No obstructing stone or cause for obstruction, however the previous left intrarenal stones are not seen. There is a subcapsular fluid collection about the left kidney with peripheral enhancement. This is crescentic, however representative measurement of 2.1 cm in thickness, 6 x 6.5 cm and AP by craniocaudal dimension. Subcapsular fluid is heterogeneous. There is no excretion of contrast  into this collection on delayed phase. Collection cause of slight contour deformity of the underlying renal parenchyma. Dilatation of the right renal pelvis and proximal ureter. No obstructing stone or cause for obstruction. Simple cyst in the right kidney is stable from prior exam, needing no further imaging follow-up. The urinary bladder is diffusely thick walled and irregular. Stomach/Bowel: Large volume of colonic stool. No bowel wall thickening or obstruction. Normal appendix tentatively visualized. The stomach is unremarkable. Duodenal diverticulum. Vascular/Lymphatic: Advanced aortic atherosclerosis. No aortic aneurysm. No bulky abdominopelvic adenopathy. Reproductive: Enlarged prostate spans 5.4 cm transverse. Other: Small fat containing bilateral inguinal hernias. No free air or ascites. Musculoskeletal: Subcutaneous soft tissue thickening overlying the left ischium. No focal fluid collection. Mild soft tissue edema along the gluteal crease. No evidence of osteomyelitis. Chronic L1 compression fracture. Stable degenerative change in the spine IMPRESSION: 1. Moderate left hydronephrosis and dilatation of the proximal ureter without cause for obstruction. Subcapsular left renal fluid collection has peripheral enhancement and is  heterogeneous measuring 6 x 6.5 x 6.5 cm. This is nonspecific, however suspicious for abscess. Possibility of perirenal hematoma is also considered if there is a history of injury. There is no excretion of contrast into this collection on delayed phase. 2. There is mild prominence of the right renal pelvis and proximal ureter, but no frank hydronephrosis or cause for obstruction. 3. Diffusely thick walled and irregular urinary bladder, a chronic finding. Recommend correlation with urinalysis. 4. Subcutaneous soft tissue thickening overlying the left ischium may represent decubitus ulcer. No focal fluid collection or evidence of osteomyelitis. 5. Chronic splenomegaly. Aortic Atherosclerosis (ICD10-I70.0). Electronically Signed   By: Narda Rutherford M.D.   On: 11/18/2022 22:36   DG Chest Port 1 View  Result Date: 11/18/2022 CLINICAL DATA:  Questionable sepsis. EXAM: PORTABLE CHEST 1 VIEW COMPARISON:  May 05, 2021 and May 06, 2021 FINDINGS: There are chronic changes in the lungs. Previous CT imaging of the abdomen and pelvis demonstrated numerous pleural plaques and calcifications. These findings are again identified. There is significant increased opacity in the left upper lobe which could represent an acute on chronic process or evolution of the chronic process. No other definite acute abnormalities identified. No pneumothorax. IMPRESSION: 1. Known calcified pleural nodularity and calcified pleural plaques again identified with some probable interval worsening since May 05, 2021. 2. More confluent opacity in the left apex could represent evolution of the chronic process or an acute on chronic process. CT imaging may better evaluate. If CT imaging is not pursued, recommend a short-term follow-up x-ray after treatment for further assessment. 3. Chronic mild superior retraction of the left hilum. Electronically Signed   By: Gerome Sam III M.D.   On: 11/18/2022 21:44    I independently reviewed  the above imaging studies.  Impression/Recommendation Left subcapsular renal fluid collection - likely residual subcapsular hematoma following left perc nephrolithotomy on 09/25/22 Left hydronephrosis - likely chronic   I reviewed the patient's chart in detail regarding his recent surgical procedure at [facility]  He did have a documented large left subcapsular hematoma following the left percutaneous nephrolithotomy.  The left subcapsular fluid collection currently seen is likely a sequela of this hematoma.  I reviewed the patient films with Dr. Elby Showers with radiology.  He was actually able to review the CT study from [facility] dated 09/26/2022 showing the subcapsular hematoma.  Both fluid collections are similar in appearance.  This does not appear to be consistent with a perinephric abscess.  Additionally, I do not think  that the left hydronephrosis is clinically significant as the patient is asymptomatic and his renal function is improving.  This is likely chronic in nature. No urologic intervention indicated at the present time. The patient should follow-up with urology at [facility] for his continued postoperative care regarding the subcapsular hematoma. Please call for any urologic problems during current hospital course.    Elige Radon Ismelda Weatherman 11/19/2022, 3:10 PM

## 2022-11-19 NOTE — IPAL (Addendum)
  Interdisciplinary Goals of Care Family Meeting   Date carried out: 11/19/2022  Location of the meeting: Phone conference  Member's involved: Physician and Family Member or next of kin  Durable Power of Attorney or acting medical decision maker: neighbor, Peter Robertson. Ms Peter Robertson has been consenting for all his prior procedures during prior hospitalizations and helps take care of him in the home when she is able and helps him with his finances Patient has a sister with dementia who is unable to assist and a brother who is very ill and is unable to be involved    Discussion: We discussed goals of care for Peter Robertson .   I have reviewed medical records including EPIC notes, labs and imaging, assessed the patient and then met with Peter Robertson to discuss major active diagnoses, plan of care, natural trajectory, prognosis, GOC, EOL wishes, disposition and options including Full code/DNI/DNR and the concept of comfort care if DNR is elected. Questions and concerns were addressed. They are  in agreement to continue current plan of care . Election for DNR/DNI status, similar to during recent past hospitalization at [facility]   Code status:   Code Status: DNR and DNI  Disposition: Continue current acute care  Time spent for the meeting: 31    Andris Baumann, MD  11/19/2022, 12:25 AM

## 2022-11-19 NOTE — Assessment & Plan Note (Signed)
Delirium precautions 

## 2022-11-19 NOTE — Progress Notes (Signed)
Pharmacy Antibiotic Note  Peter Robertson is a 82 y.o. male admitted on 11/18/2022 with cellulitis.  Pharmacy has been consulted for Vancomycin dosing for 7 days.  Plan: Pt given Vancomycin 1000 mg once. Vancomycin 500 mg IV Q 18 hrs. Goal AUC 400-550. Expected AUC: 487 SCr used: 1.45,TBW 60.1 kg < IBW 63.8 kg  Pharmacy will continue to follow and will adjust abx dosing whenever warranted.  Temp (24hrs), Avg:98.1 F (36.7 C), Min:98.1 F (36.7 C), Max:98.1 F (36.7 C)   Recent Labs  Lab 11/18/22 2112 11/18/22 2113  WBC  --  11.8*  CREATININE  --  1.45*  LATICACIDVEN 1.2  --     Estimated Creatinine Clearance: 32.8 mL/min (A) (by C-G formula based on SCr of 1.45 mg/dL (H)).    Allergies  Allergen Reactions   Sulfamethoxazole-Trimethoprim Other (See Comments)    dizziness dizziness dizziness    Tape Rash    blisters    Antimicrobials this admission: 6/01 Ceftriaxone >>  6/01 Vancomycin >> x 7 days  Microbiology results: 6/01 BCx: Pending  Thank you for allowing pharmacy to be a part of this patient's care.  Otelia Sergeant, PharmD, [facility] 11/19/2022 12:17 AM

## 2022-11-20 DIAGNOSIS — Z794 Long term (current) use of insulin: Secondary | ICD-10-CM

## 2022-11-20 DIAGNOSIS — Z515 Encounter for palliative care: Secondary | ICD-10-CM

## 2022-11-20 DIAGNOSIS — L089 Local infection of the skin and subcutaneous tissue, unspecified: Secondary | ICD-10-CM | POA: Diagnosis not present

## 2022-11-20 DIAGNOSIS — L89153 Pressure ulcer of sacral region, stage 3: Secondary | ICD-10-CM | POA: Diagnosis not present

## 2022-11-20 DIAGNOSIS — E119 Type 2 diabetes mellitus without complications: Secondary | ICD-10-CM

## 2022-11-20 DIAGNOSIS — L8994 Pressure ulcer of unspecified site, stage 4: Secondary | ICD-10-CM | POA: Diagnosis not present

## 2022-11-20 LAB — URINE CULTURE: Culture: >=100000 — AB

## 2022-11-20 LAB — CULTURE, BLOOD (ROUTINE X 2)

## 2022-11-20 LAB — GLUCOSE, CAPILLARY
Glucose-Capillary: 176 mg/dL — ABNORMAL HIGH (ref 70–99)
Glucose-Capillary: 227 mg/dL — ABNORMAL HIGH (ref 70–99)
Glucose-Capillary: 276 mg/dL — ABNORMAL HIGH (ref 70–99)
Glucose-Capillary: 219 mg/dL — ABNORMAL HIGH (ref 70–99)

## 2022-11-20 LAB — CREATININE, SERUM
Creatinine, Ser: 2.11 mg/dL — ABNORMAL HIGH (ref 0.61–1.24)
GFR, Estimated: 30 mL/min — ABNORMAL LOW (ref >60)

## 2022-11-20 MED ORDER — ORAL CARE MOUTH RINSE: 15.0000 mL | OROMUCOSAL | Status: DC | PRN

## 2022-11-20 MED ORDER — VANCOMYCIN VARIABLE DOSE PER UNSTABLE RENAL FUNCTION (PHARMACIST DOSING): Status: DC

## 2022-11-20 MED ORDER — GABAPENTIN 300 MG PO CAPS
300.0000 mg | ORAL_CAPSULE | Freq: Three times a day (TID) | ORAL | Status: DC
  Administered 2022-11-20 – 2022-11-27 (×22): 300 mg via ORAL
  Filled 2022-11-20 (×22): qty 1

## 2022-11-20 MED ORDER — ADULT MULTIVITAMIN W/MINERALS CH
1.0000 | ORAL_TABLET | Freq: Every day | ORAL | Status: DC
  Administered 2022-11-20 – 2022-11-27 (×8): 1 via ORAL
  Filled 2022-11-20 (×8): qty 1

## 2022-11-20 MED ORDER — MEDIHONEY WOUND/BURN DRESSING EX PSTE
1.0000 | PASTE | Freq: Every day | CUTANEOUS | Status: DC
  Administered 2022-11-20 – 2022-11-27 (×8): 1 via TOPICAL
  Filled 2022-11-20: qty 44

## 2022-11-20 MED ORDER — LACTATED RINGERS IV SOLN: INTRAVENOUS | Status: DC

## 2022-11-20 MED ORDER — ENSURE ENLIVE PO LIQD
237.0000 mL | Freq: Three times a day (TID) | ORAL | Status: DC
  Administered 2022-11-20 – 2022-11-27 (×20): 237 mL via ORAL

## 2022-11-20 MED ORDER — APIXABAN 2.5 MG PO TABS
2.5000 mg | ORAL_TABLET | Freq: Two times a day (BID) | ORAL | Status: DC
  Administered 2022-11-20: 2.5 mg via ORAL
  Filled 2022-11-20: qty 1

## 2022-11-20 MED ORDER — VITAMIN C 500 MG PO TABS
500.0000 mg | ORAL_TABLET | Freq: Two times a day (BID) | ORAL | Status: DC
  Administered 2022-11-20 – 2022-11-27 (×15): 500 mg via ORAL
  Filled 2022-11-20 (×15): qty 1

## 2022-11-20 MED ORDER — ZINC SULFATE 220 (50 ZN) MG PO CAPS
220.0000 mg | ORAL_CAPSULE | Freq: Every day | ORAL | Status: DC
  Administered 2022-11-20 – 2022-11-27 (×8): 220 mg via ORAL
  Filled 2022-11-20 (×8): qty 1

## 2022-11-20 MED ORDER — HYDROCORTISONE 1 % EX CREA
TOPICAL_CREAM | Freq: Two times a day (BID) | CUTANEOUS | Status: DC
  Administered 2022-11-21: 1 via TOPICAL
  Filled 2022-11-20: qty 28

## 2022-11-20 NOTE — Progress Notes (Signed)
Pharmacy Antibiotic Note  Peter Robertson is a 82 y.o. male w/ PMH of HFpEF (EF greater than 55% 07/2022), BPH, insulin requiring type 2 diabetes, dementia, CKD 3A, history of hydronephrosis requiring nephrostomy tubes (4/2 - 10/03/2022), HTN, CAD s/p CABG and paroxysmal A-fib on Eliquis admitted on 11/18/2022 with cellulitis.  Pharmacy has been consulted for vancomycin dosing. On 6/2, vancomycin dose was adjusted to 750 mg IV Q 24 hrs.   Plan:  SCr was 2.11 on 6/3. D/c scheduled regimen and switch to dosing by levels due to kidney function.   Pharmacy will continue to follow and will adjust abx dosing whenever warranted.  Temp (24hrs), Avg:97.6 F (36.4 C), Min:97.5 F (36.4 C), Max:97.7 F (36.5 C)   Recent Labs  Lab 11/18/22 2112 11/18/22 2113 11/19/22 0554 11/20/22 0409  WBC  --  11.8* 10.2  --   CREATININE  --  1.45* 1.32* 2.11*  LATICACIDVEN 1.2  --   --   --      Estimated Creatinine Clearance: 22.5 mL/min (A) (by C-G formula based on SCr of 2.11 mg/dL (H)).    Allergies  Allergen Reactions   Sulfamethoxazole-Trimethoprim Other (See Comments)    dizziness dizziness dizziness    Tape Rash    blisters    Antimicrobials this admission: 6/01 ceftriaxone >>  6/01 vancomycin >>   Microbiology results: 6/01 BCx: NG < 12 hours  Thank you for allowing pharmacy to be a part of this patient's care.  Burnis Medin, PharmD, BCPS  11/20/2022 12:42 PM

## 2022-11-20 NOTE — Consult Note (Addendum)
Consultation Note Date: 11/20/2022 at 1245  Patient Name: Peter Robertson  DOB: 06/16/1941  MRN: [id number]  Age / Sex: 82 y.o., male  PCP: Jenell Milliner, MD Referring Physician: Tresa Moore, MD  Reason for Consultation: Establishing goals of care  HPI/Patient Profile: 82 y.o. male  with past medical history of HFpEF, BPH, type 2 diabetes (insulin-dependent), dementia, CKD (stage IIIa), hydronephrosis requiring nephrostomy tubes, HTN, CAD s/p CABG, and paroxysmal A-fib (Eliquis) admitted on 11/18/2022 with concern for wound infection after patient reported burning sensation to the areas.   Patient is being treated with IV antibiotics and aggressive wound care.  No need for surgical consult at this time.  PMT was consulted to discuss goals of care.  Clinical Assessment and Goals of Care: I have reviewed medical records including EPIC notes, labs and imaging, assessed the patient and then met with patient at bedside to discuss diagnosis prognosis, GOC, EOL wishes, disposition and options.  I introduced Palliative Medicine as specialized medical care for people living with serious illness. It focuses on providing relief from the symptoms and stress of a serious illness. The goal is to improve quality of life for both the patient and the family.  We discussed a brief life review of the patient.  Patient is a widow and had two sons who are now deceased.  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    He states that he lives with his friend Babette Relic and her husband.  He endorses that they are "good to me" and help take care of him, cook his meals, do his laundry, and assistant with ADLs. However, he also complains that they do not keep the house clean and they "run off" anyone that wants to help him. He shares conflicting opinions of [name] but endorses that she is his surrogate decision  maker and someone he trusts to help him.   As far as functional and nutritional status patient says he has not wanted to get OOB or move for "quite some time". He was vague in his responses to his diet/PO intake.   We discussed patient's current illness and what it means in the larger context of patient's on-going co-morbidities. Multiple hospitalizations, frequent falls, sacral wounds, and functional status discussed.    I attempted to elicit values and goals of care important to the patient.  Patient states he would like to have his "butt dry".  Education provided on wound care and importance of moist wound bed to promote healing.  Patient states he does not want to put any more "salt on the meat" and wants it to be dry.  Ongoing education for wound healing needed.  Patient also states he wants go home and be left alone.  He likes to be by himself.  However, he recognizes that he needs [name] and her husband's help in order to be able to function at home.  In light of patient's goals, I discussed aging in place, quality versus quantity of life,  and remaining at home to avoid future hospitalizations with use of hospice services.  Patient shares he has used services before and they were "taken away".  He is unclear on what services he has utilized in the past.  I attempted to clarify if this was home health or hospice and patient was not sure.  The difference between aggressive medical intervention and comfort care was considered in light of the patient's goals of remaining at home. Patient says he wants to get treated and is okay with being in the hospital if that means he is going to get better.   Patient is facing treatment option decisions, advanced directive, and anticipatory care needs. However, he is resistant to further discussing goals or boundaries of care. He remains fixated on discussing how he wants his house to be kept clean. Despite my efforts to redirect patient to GOC, he was avoiding  further discussing these topics with me today. He was accepting of my return to continue GOC discussions tomorrow.  Discussed with patient the importance of continued conversation with family/friends and the medical providers regarding overall plan of care and treatment options, ensuring decisions are within the context of the patient's values and GOCs.   Given patient's history of dementia, documented as far back as 2018, I did not make any adjustments or changes to POC.   I attempted to speak with patient's surrogate decision maker [name] over the phone. No answer. HIPAA compliant VM left with PMT contact info given.   Plan remains to treat the treatable. PMT will continue to follow.   Addendeum: I received a message that [name] returned my call. I called her back and she was unable to speak at that time - saying she was at work. She plans to call PMT later. PMT remains available to support patient and [name].    Primary Decision Maker PATIENT  Physical Exam Vitals reviewed.  Constitutional:      General: He is not in acute distress.    Appearance: He is normal weight.  HENT:     Head: Normocephalic.     Nose: Nose normal.     Mouth/Throat:     Comments: Missing several teeth/poor dentition Eyes:     Pupils: Pupils are equal, round, and reactive to light.  Cardiovascular:     Pulses: Normal pulses.  Pulmonary:     Effort: Pulmonary effort is normal.  Abdominal:     Palpations: Abdomen is soft.  Musculoskeletal:     Comments: Generalized weakness  Skin:    General: Skin is warm and dry.     Comments: UTA sacral wounds  Neurological:     Mental Status: He is alert.  Psychiatric:        Mood and Affect: Mood normal.        Behavior: Behavior normal.        Thought Content: Thought content normal.        Judgment: Judgment normal.     Palliative Assessment/Data: 30%     Thank you for this consult. Palliative medicine will continue to follow and assist holistically.    Time Total: 75 minutes Greater than 50%  of this time was spent counseling and coordinating care related to the above assessment and plan.  Signed by: Georgiann Cocker, DNP, FNP-BC Palliative Medicine    Please contact Palliative Medicine Team phone at [phone] for questions and concerns.  For individual provider: See Loretha Stapler

## 2022-11-20 NOTE — Progress Notes (Signed)
PROGRESS NOTE    Peter Robertson  VOJ:[id number] DOB: [birth year] DOA: 11/18/2022 PCP: Jenell Milliner, MD    Brief Narrative:  82 y.o. male with medical history significant for HFpEF (EF greater than 55% 07/2022), BPH, insulin requiring type 2 diabetes, dementia, CKD 3A, history of hydronephrosis requiring nephrostomy tubes (4/2 - 10/03/2022), HTN, CAD s/p CABG and paroxysmal A-fib on Eliquis, hospitalized a month ago at [facility] from 4/13 to 4/20 with respiratory failure secondary to influenza, discharged on room air, and history of a hospitalization in January 2024 for traumatic intraventricular hemorrhage requiring external ventricular drain, presently deconditioned and with sacral decubitus ulcers who was brought by EMS due to concern for wound infection after patient reported burning to the areas.  Patient lives alone and family checks in on him.No reported fevers or chills. ED course and data review: Mildly tachypneic to 23 in the ED but otherwise unremarkable vitals.  Labs with WBC 11,800 with lactic acid 1.2.  Hemoglobin 9.2, slightly up from baseline of 8.2 when discharged a month ago.     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   Paroxysmal atrial fibrillation (HCC)   Renal hematoma, left  * Infected decubitus ulcer Patient with purulent drainage from sacral decubitus ulcers Continue Rocephin and vancomycin Follow cultures Wound care consult No  need for surgical consultation currently   Paroxysmal atrial fibrillation (HCC) Chronic anticoagulation Can restart Eliquis 6/3 Continue metoprolol Monitor for bleeding   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 This is nonspecific, however suspicious for abscess" Seen by urology, no intervention indicated Plan: Resume Eliquis  Stage 3a chronic kidney disease (HCC) Renal function at baseline   Chronic diastolic CHF (congestive heart failure) (HCC) Clinically euvolemic Continue valsartan, metoprolol, isosorbide, Lasix Daily weights   Dementia without behavioral disturbance (HCC) Delirium precautions   Uncontrolled type 2 diabetes mellitus with hyperglycemia, with long-term current use of insulin (HCC) Blood sugar 234 Continue basal insulin with sliding scale coverage   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) No complaints of chest pain, EKG nonacute Continue nitroglycerin, metoprolol, Imdur, atorvastatin.  Not on antiplatelets likely because of chronic anticoagulation with apixaban   DVT prophylaxis: Eliquis Code Status: DNR Family Communication: None today Disposition Plan: Status is: Inpatient Remains inpatient appropriate because: Multiple acute issues as above   Level of care: Med-Surg  Consultants:  Urology  Procedures:   None  Antimicrobials: Vancomycin Ceftriaxone   Subjective: Seen and examined.  Resting in bed.  Reports continued  burning from sacral wounds.  Objective: Vitals:   11/19/22 1957 11/19/22 2258 11/20/22 0800 11/20/22 1100  BP: 120/72 122/70 120/80   Pulse: 72 70 69   Resp:  20 18   Temp:  97.7 F (36.5 C)  97.7 F (36.5 C)  TempSrc:    Oral  SpO2:  100% 99%   Weight:      Height:        Intake/Output Summary (Last 24 hours) at 11/20/2022 1239 Last data filed at 11/20/2022 1037 Gross per 24 hour  Intake 649.33 ml  Output 425 ml  Net 224.33 ml   Filed Weights   11/18/22 2051  Weight: 60.1 kg    Examination:  General exam: No acute distress.  Frail and chronically ill Respiratory system: Bibasilar crackles.  Normal work of breathing.  Room air Cardiovascular system: S1-2, RRR, no murmurs, no pedal edema Gastrointestinal system: Thin, soft, nontender nondistended, normal bowel sounds Central nervous system: Alert and oriented. No focal neurological deficits. Extremities: Symmetric 5 x 5 power. Skin: Multifocal sacral wounds.  No drainage noted Psychiatry: Judgement and insight appear normal. Mood & affect appropriate.     Data Reviewed: I have personally reviewed following labs and imaging studies  CBC: Recent Labs  Lab 11/18/22 2113 11/19/22 0554  WBC 11.8* 10.2  NEUTROABS 10.7*  --   HGB 9.2* 8.4*  HCT 29.8* 26.8*  MCV 79.0* 78.1*  PLT 154 133*   Basic Metabolic Panel: Recent Labs  Lab 11/18/22 2113 11/19/22 0554 11/20/22 0409  NA 135 136  --   K 3.7 3.4*  --   CL 106 107  --   CO2 23 21*  --   GLUCOSE 234* 205*  --   BUN 43* 37*  --   CREATININE 1.45* 1.32* 2.11*  CALCIUM 8.5* 8.2*  --    GFR: Estimated Creatinine Clearance: 22.5 mL/min (A) (by C-G formula based on SCr of 2.11 mg/dL (H)). Liver Function Tests: Recent Labs  Lab 11/18/22 2113  AST 13*  ALT 11  ALKPHOS 83  BILITOT 0.7  PROT 7.0  ALBUMIN 2.9*   No results for  input(s): "LIPASE", "AMYLASE" in the last 168 hours. No results for input(s): "AMMONIA" in the last 168 hours. Coagulation Profile: No results for input(s): "INR", "PROTIME" in the last 168 hours. Cardiac Enzymes: No results for input(s): "CKTOTAL", "CKMB", "CKMBINDEX", "TROPONINI" in the last 168 hours. BNP (last 3 results) No results for input(s): "PROBNP" in the last 8760 hours. HbA1C: No results for input(s): "HGBA1C" in the last 72 hours. CBG: Recent Labs  Lab 11/19/22 1150 11/19/22 1707 11/19/22 2231 11/20/22 0759 11/20/22 1154  GLUCAP 256* 111* 191* 176* 227*   Lipid Profile: No results for input(s): "CHOL", "HDL", "LDLCALC", "TRIG", "CHOLHDL", "LDLDIRECT" in the last 72 hours. Thyroid Function Tests: No results for input(s): "TSH", "T4TOTAL", "FREET4", "T3FREE", "THYROIDAB" in the last 72 hours. Anemia Panel: No results for input(s): "VITAMINB12", "FOLATE", "FERRITIN", "TIBC", "IRON", "RETICCTPCT" in the last 72 hours. Sepsis Labs: Recent Labs  Lab 11/18/22 2112  LATICACIDVEN 1.2    Recent Results (from the past 240 hour(s))  Blood Culture (routine x 2)     Status: None (Preliminary result)   Collection Time: 11/18/22  9:12 PM   Specimen: BLOOD  Result Value Ref Range Status   Specimen Description BLOOD BLOOD LEFT ARM  Final   Special Requests   Final    BOTTLES DRAWN AEROBIC AND ANAEROBIC Blood Culture results may not be optimal due to  an excessive volume of blood received in culture bottles   Culture   Final    NO GROWTH 2 DAYS Performed at [facility], [address] [place], [place] [address]    Report Status PENDING  Incomplete  Blood Culture (routine x 2)     Status: None (Preliminary result)   Collection Time: 11/18/22  9:12 PM   Specimen: BLOOD  Result Value Ref Range Status   Specimen Description BLOOD BLOOD RIGHT ARM  Final   Special Requests   Final    BOTTLES DRAWN AEROBIC AND ANAEROBIC Blood Culture results may not be optimal due to  an excessive volume of blood received in culture bottles   Culture   Final    NO GROWTH 2 DAYS Performed at [facility], [address], [place], [place] [address]    Report Status PENDING  Incomplete  Urine Culture     Status: Abnormal   Collection Time: 11/19/22  2:07 AM   Specimen: Urine, Random  Result Value Ref Range Status   Specimen Description   Final    URINE, RANDOM Performed at [facility], [address] [place], [place] [address]    Special Requests   Final    NONE Reflexed from [phone] Performed at [facility], [address] [place], [place] [address]    Culture >=100,000 COLONIES/mL YEAST (A)  Final   Report Status 11/20/2022 FINAL  Final         Radiology Studies: CT ABDOMEN PELVIS W CONTRAST  Result Date: 11/18/2022 CLINICAL DATA:  82 year old with abdominal pain. Infected sacral decubitus wounds. EXAM: CT ABDOMEN AND PELVIS WITH CONTRAST TECHNIQUE: Multidetector CT imaging of the abdomen and pelvis was performed using the standard protocol following bolus administration of intravenous contrast. RADIATION DOSE REDUCTION: This exam was performed according to the departmental dose-optimization program which includes automated exposure control, adjustment of the mA and/or kV according to patient size and/or use of iterative reconstruction technique. 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 Previous right pleural effusion has near completely resolved. Hepatobiliary: No focal liver abnormality is seen. No gallstones, gallbladder wall thickening, or biliary dilatation. Pancreas: Parenchymal atrophy. Coarse calcifications in the pancreatic head. No ductal dilatation or inflammation. Spleen: Enlarged, the spleen measures 15 x 10 x 13.2 cm (volume = 1000 cm^3). This is chronic. Tiny hypodensity superiorly is  nonspecific. Adrenals/Urinary Tract: No adrenal nodule. Moderate left hydronephrosis with proximal hydroureter. No obstructing stone or cause for obstruction, however the previous left intrarenal stones are not seen. There is a subcapsular fluid collection about the left kidney with peripheral enhancement. This is crescentic, however representative measurement of 2.1 cm in thickness, 6 x 6.5 cm and AP by craniocaudal dimension. Subcapsular fluid is heterogeneous. There is no excretion of contrast into this collection on delayed phase. Collection cause of slight contour deformity of the underlying renal parenchyma. Dilatation of the right renal pelvis and proximal ureter. No obstructing stone or cause for obstruction. Simple cyst in the right kidney is stable from prior exam, needing no further imaging follow-up. The urinary bladder is diffusely thick walled and irregular. Stomach/Bowel: Large volume of colonic stool. No bowel wall thickening or obstruction. Normal appendix tentatively visualized. The stomach is unremarkable. Duodenal diverticulum. Vascular/Lymphatic: Advanced aortic atherosclerosis. No aortic aneurysm. No bulky abdominopelvic adenopathy. Reproductive: Enlarged prostate spans 5.4 cm transverse. Other: Small fat containing bilateral inguinal  hernias. No free air or ascites. Musculoskeletal: Subcutaneous soft tissue thickening overlying the left ischium. No focal fluid collection. Mild soft tissue edema along the gluteal crease. No evidence of osteomyelitis. Chronic L1 compression fracture. Stable degenerative change in the spine IMPRESSION: 1. Moderate left hydronephrosis and dilatation of the proximal ureter without cause for obstruction. Subcapsular left renal fluid collection has peripheral enhancement and is heterogeneous measuring 6 x 6.5 x 6.5 cm. This is nonspecific, however suspicious for abscess. Possibility of perirenal hematoma is also considered if there is a history of injury. There is no  excretion of contrast into this collection on delayed phase. 2. There is mild prominence of the right renal pelvis and proximal ureter, but no frank hydronephrosis or cause for obstruction. 3. Diffusely thick walled and irregular urinary bladder, a chronic finding. Recommend correlation with urinalysis. 4. Subcutaneous soft tissue thickening overlying the left ischium may represent decubitus ulcer. No focal fluid collection or evidence of osteomyelitis. 5. Chronic splenomegaly. Aortic Atherosclerosis (ICD10-I70.0). Electronically Signed   By: Narda Rutherford M.D.   On: 11/18/2022 22:36   DG Chest Port 1 View  Result Date: 11/18/2022 CLINICAL DATA:  Questionable sepsis. EXAM: PORTABLE CHEST 1 VIEW COMPARISON:  May 05, 2021 and May 06, 2021 FINDINGS: There are chronic changes in the lungs. Previous CT imaging of the abdomen and pelvis demonstrated numerous pleural plaques and calcifications. These findings are again identified. There is significant increased opacity in the left upper lobe which could represent an acute on chronic process or evolution of the chronic process. No other definite acute abnormalities identified. No pneumothorax. IMPRESSION: 1. Known calcified pleural nodularity and calcified pleural plaques again identified with some probable interval worsening since May 05, 2021. 2. More confluent opacity in the left apex could represent evolution of the chronic process or an acute on chronic process. CT imaging may better evaluate. If CT imaging is not pursued, recommend a short-term follow-up x-ray after treatment for further assessment. 3. Chronic mild superior retraction of the left hilum. Electronically Signed   By: Gerome Sam III M.D.   On: 11/18/2022 21:44        Scheduled Meds:  vitamin C  500 mg Oral BID   atorvastatin  40 mg Oral QAC supper   feeding supplement  237 mL Oral TID BM   furosemide  20 mg Oral Daily   gabapentin  300 mg Oral TID   hydrocortisone  cream   Topical BID   insulin aspart  0-15 Units Subcutaneous TID WC   insulin aspart  0-5 Units Subcutaneous QHS   irbesartan  75 mg Oral Daily   isosorbide mononitrate  60 mg Oral Daily   leptospermum manuka honey  1 Application Topical Daily   metoprolol succinate  100 mg Oral Daily   multivitamin with minerals  1 tablet Oral Daily   pantoprazole  40 mg Oral Daily   sertraline  50 mg Oral Daily   tamsulosin  0.8 mg Oral QHS   zinc sulfate  220 mg Oral Daily   Continuous Infusions:  sodium chloride 10 mL/hr at 11/19/22 2222   cefTRIAXone (ROCEPHIN)  IV 1 g (11/19/22 2224)   lactated ringers 100 mL/hr at 11/20/22 1001   vancomycin 750 mg (11/20/22 0017)     LOS: 2 days     Tresa Moore, MD [facility]   If 7PM-7AM, please contact night-coverage  11/20/2022, 12:39 PM

## 2022-11-20 NOTE — Plan of Care (Signed)
  Problem: Clinical Measurements: Goal: Ability to avoid or minimize complications of infection will improve Outcome: Progressing   Problem: Skin Integrity: Goal: Skin integrity will improve Outcome: Progressing   Problem: Education: Goal: Ability to describe self-care measures that may prevent or decrease complications (Diabetes Survival Skills Education) will improve Outcome: Progressing   Problem: Coping: Goal: Ability to adjust to condition or change in health will improve Outcome: Progressing   Problem: Fluid Volume: Goal: Ability to maintain a balanced intake and output will improve Outcome: Progressing   Problem: Health Behavior/Discharge Planning: Goal: Ability to identify and utilize available resources and services will improve Outcome: Progressing

## 2022-11-20 NOTE — Progress Notes (Signed)
Initial Nutrition Assessment  DOCUMENTATION CODES:   Severe malnutrition in context of chronic illness  INTERVENTION:   -Ensure Enlive po TID, each supplement provides 350 kcal and 20 grams of protein.  -MVI with minerals daily -500 mg vitamin C BID -220 mg zinc sulfate daily x 14 days  NUTRITION DIAGNOSIS:   Severe Malnutrition related to chronic illness (CHF) as evidenced by moderate fat depletion, severe fat depletion, moderate muscle depletion, severe muscle depletion, percent weight loss.  GOAL:   Patient will meet greater than or equal to 90% of their needs  MONITOR:   PO intake, Supplement acceptance  REASON FOR ASSESSMENT:   Consult Assessment of nutrition requirement/status, Wound healing  ASSESSMENT:   Pt with medical history significant for HFpEF (EF greater than 55% 07/2022), BPH, insulin requiring type 2 diabetes, dementia, CKD 3A, history of hydronephrosis requiring nephrostomy tubes (4/2 - 10/03/2022), HTN, CAD s/p CABG and paroxysmal A-fib on Eliquis, hospitalized a month ago at [facility] from 4/13 to 4/20 with respiratory failure secondary to influenza, discharged on room air, and history of a hospitalization in January 2024 for 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  Pt admitted with infected sacral ulcer.   Reviewed I/O's: -136 ml x 24 hours  UOP: 425 ml x 24 hours  Per CWOCN notes, pt with stage 3 pressure injury to sacrum.   Spoke with pt at bedside, who was pleasant and in good spirits today. Pt reports good appetite, consuming 100% of breakfast today. Pt is on a regular diet and reports no difficulty chewing or swallowing despite "that I only have one tooth". Pt shares that he lives with his full times caregiver who prepares meals for him. Pt reports he eats "whatever I  want"; breakfast consists of eggs, pancakes, and breakfast meat and lunch and dinner hamburger or sandwiches.   Pt reports he has had this wound for a while, states he got this because he was less mobile and sitting more.   Pt unsure of his UBW. Pt think he has gained weight secondary to fluid retention. Reviewed wt hx; pt has experienced a 26.3% wt loss over the past 3 months, which is significant for time frame.   Discussed importance of good meal and supplement intake to promote healing. Pt amenable to "whatever you think will help me get better".    Medications reviewed and include vitamin C, neurontin, and lactated ringers infusion @ 100 ml/hr.   Lab Results  Component Value Date   HGBA1C 7.1 (H) 05/06/2021   PTA DM medications are 25 mg jardiance daily and 30 units insulin glargine daily.   Labs reviewed: K: 3.4, CBGS: 176-227 (inpatient orders for glycemic control are 0-15 units insulin aspart TID with meals and 0-5 units insulin aspart daily at bedtime).    NUTRITION - FOCUSED PHYSICAL EXAM:  Flowsheet Row Most Recent Value  Orbital Region Severe depletion  Upper Arm Region Severe depletion  Thoracic and Lumbar Region Moderate depletion  Buccal Region Severe depletion  Temple Region Severe depletion  Clavicle Bone Region Severe depletion  Clavicle and Acromion Bone Region Severe depletion  Scapular Bone Region Severe depletion  Dorsal Hand Severe depletion  Patellar Region Severe depletion  Anterior Thigh Region Severe depletion  Posterior Calf Region Severe depletion  Edema (RD Assessment) None  Hair Reviewed  Eyes Reviewed  Mouth Reviewed  Skin  Reviewed  Nails Reviewed       Diet Order:   Diet Order             Diet regular Fluid consistency: Thin  Diet effective now                   EDUCATION NEEDS:   Education needs have been addressed  Skin:  Skin Assessment: Skin Integrity Issues: Skin Integrity Issues:: Stage II Stage II: coccyx  Last BM:   11/19/22  Height:   Ht Readings from Last 1 Encounters:  11/18/22 5\' 6"  (1.676 m)    Weight:   Wt Readings from Last 1 Encounters:  11/18/22 60.1 kg    Ideal Body Weight:  59.1 kg  BMI:  Body mass index is 21.37 kg/m.  Estimated Nutritional Needs:   Kcal:  1800-2000  Protein:  90-105 grams  Fluid:  1.8-2.0 L    Levada Schilling, RD, LDN, CDCES Registered Dietitian II Certified Diabetes Care and Education Specialist Please refer to [facility] for RD and/or RD on-call/weekend/after hours pager

## 2022-11-20 NOTE — Plan of Care (Signed)
  Problem: Skin Integrity: Goal: Skin integrity will improve Outcome: Not Progressing   Problem: Coping: Goal: Ability to adjust to condition or change in health will improve Outcome: Progressing   Problem: Fluid Volume: Goal: Ability to maintain a balanced intake and output will improve Outcome: Progressing

## 2022-11-20 NOTE — Consult Note (Signed)
WOC Nurse Consult Note: Reason for Consult: pressure injuries Patient from home, with caregivers who "check in on him". History of pressure injuries from recent admission at [facility]. Hx DM, CAD.   Wound type: Left ischial tuberosity: Stage 3 Pressure Injury; 1cm x 3cm x 0.2cm; 100% clean, with undermining  2. Coccyx : Stage 3 Pressure Injury; appears to be improved; 1cm x 1cm x0.1cm; 100% hyper granulated   3. Right ischial tuberosity: Unstageable Pressure Injury vs infectious process; centrally area of 0.3cm x 0.3cm x 0cm yellow slough, however noted induration that extends 4cm circumferentially and is very tender to palpation. Attempted to probe centrally does not open   4. ICD (irritant contact dermatitis) related to urinary incontinence inner thighs  5. Left elbow lateral; Stage 3 Pressure Injury vs trauma, pt poor historian, not able to tell me if he injured this area.   6. Left elbow medial; scabbed; trauma  7. Scattered areas over the bilateral LEs appears to be related to trauma (falls) however patient reports no such issues  8. Indurated left inner upper arm; not associated with IV site that I can tell, edematous, TTP. Bedside nurse to the room. Will notify MD. Warm compress recommended at this time. Approx. 6cm x 5cm x 0cm    Pressure Injury POA: Yes Measurement: see above  Wound bed: see above Drainage (amount, consistency, odor) scant at all sites Periwound: intact, fragile skin  Dressing procedure/placement/frequency: Add low air loss mattress for moisture management and pressure redistribution Add consultation from RD for supplementation related to wound healing, lives alone Add chair pressure redistribution pad for use when up in chair, that can be taken with patient for use in SNF or home.  Xeroform gauze to the left arm wounds that are open, cover with foam. Change every 3 days Warm compress to area upper inner left bicep; monitor for changes Medihoney to the right  buttock wound, centrally, cover with silicone foam. Reapply Medihoney daily, ok to change foam every 3 days.  Silicone foam to the coccyx, change every 3 days and PRN soilage Silicone foam to the pretibial area, concern that SCDs are causing pressure, recommend DC of SCDs at this time.  Discussed POC with patient and bedside nurse.  Re consult if needed, will not follow at this time. Thanks  Zamari Bonsall [employer], RN,CWOCN, CNS, CWON-AP [phone])

## 2022-11-21 DIAGNOSIS — L089 Local infection of the skin and subcutaneous tissue, unspecified: Secondary | ICD-10-CM | POA: Diagnosis not present

## 2022-11-21 DIAGNOSIS — E43 Unspecified severe protein-calorie malnutrition: Secondary | ICD-10-CM | POA: Insufficient documentation

## 2022-11-21 DIAGNOSIS — I480 Paroxysmal atrial fibrillation: Secondary | ICD-10-CM

## 2022-11-21 DIAGNOSIS — F0390 Unspecified dementia without behavioral disturbance: Secondary | ICD-10-CM

## 2022-11-21 DIAGNOSIS — I5032 Chronic diastolic (congestive) heart failure: Secondary | ICD-10-CM

## 2022-11-21 DIAGNOSIS — L8994 Pressure ulcer of unspecified site, stage 4: Secondary | ICD-10-CM | POA: Diagnosis not present

## 2022-11-21 DIAGNOSIS — L89153 Pressure ulcer of sacral region, stage 3: Secondary | ICD-10-CM | POA: Diagnosis not present

## 2022-11-21 DIAGNOSIS — E119 Type 2 diabetes mellitus without complications: Secondary | ICD-10-CM | POA: Diagnosis not present

## 2022-11-21 LAB — CBC WITH DIFFERENTIAL/PLATELET
Abs Immature Granulocytes: 0.05 10*3/uL (ref 0.00–0.07)
Basophils Absolute: 0 10*3/uL (ref 0.0–0.1)
Basophils Relative: 0 %
Eosinophils Absolute: 0.1 10*3/uL (ref 0.0–0.5)
Eosinophils Relative: 1 %
HCT: 25.5 % — ABNORMAL LOW (ref 39.0–52.0)
Hemoglobin: 8 g/dL — ABNORMAL LOW (ref 13.0–17.0)
Immature Granulocytes: 1 %
Lymphocytes Relative: 4 %
Lymphs Abs: 0.4 10*3/uL — ABNORMAL LOW (ref 0.7–4.0)
MCH: 24.8 pg — ABNORMAL LOW (ref 26.0–34.0)
MCHC: 31.4 g/dL (ref 30.0–36.0)
MCV: 78.9 fL — ABNORMAL LOW (ref 80.0–100.0)
Monocytes Absolute: 0.6 10*3/uL (ref 0.1–1.0)
Monocytes Relative: 6 %
Neutro Abs: 9.1 10*3/uL — ABNORMAL HIGH (ref 1.7–7.7)
Neutrophils Relative %: 88 %
Platelets: 145 10*3/uL — ABNORMAL LOW (ref 150–400)
RBC: 3.23 MIL/uL — ABNORMAL LOW (ref 4.22–5.81)
RDW: 15.2 % (ref 11.5–15.5)
WBC: 10.3 10*3/uL (ref 4.0–10.5)
nRBC: 0 % (ref 0.0–0.2)

## 2022-11-21 LAB — BASIC METABOLIC PANEL
Anion gap: 9 (ref 5–15)
BUN: 46 mg/dL — ABNORMAL HIGH (ref 8–23)
CO2: 22 mmol/L (ref 22–32)
Calcium: 8.2 mg/dL — ABNORMAL LOW (ref 8.9–10.3)
Chloride: 102 mmol/L (ref 98–111)
Creatinine, Ser: 1.62 mg/dL — ABNORMAL HIGH (ref 0.61–1.24)
GFR, Estimated: 42 mL/min — ABNORMAL LOW (ref >60)
Glucose, Bld: 257 mg/dL — ABNORMAL HIGH (ref 70–99)
Potassium: 4.6 mmol/L (ref 3.5–5.1)
Sodium: 133 mmol/L — ABNORMAL LOW (ref 135–145)

## 2022-11-21 LAB — GLUCOSE, CAPILLARY
Glucose-Capillary: 140 mg/dL — ABNORMAL HIGH (ref 70–99)
Glucose-Capillary: 204 mg/dL — ABNORMAL HIGH (ref 70–99)
Glucose-Capillary: 214 mg/dL — ABNORMAL HIGH (ref 70–99)
Glucose-Capillary: 248 mg/dL — ABNORMAL HIGH (ref 70–99)
Glucose-Capillary: 306 mg/dL — ABNORMAL HIGH (ref 70–99)

## 2022-11-21 LAB — HEMOGLOBIN A1C
Hgb A1c MFr Bld: 6.8 % — ABNORMAL HIGH (ref 4.8–5.6)
Mean Plasma Glucose: 148 mg/dL

## 2022-11-21 LAB — VANCOMYCIN, RANDOM: Vancomycin Rm: 10 ug/mL

## 2022-11-21 LAB — CULTURE, BLOOD (ROUTINE X 2)

## 2022-11-21 MED ORDER — DOCUSATE SODIUM 100 MG PO CAPS
100.0000 mg | ORAL_CAPSULE | Freq: Two times a day (BID) | ORAL | Status: DC | PRN
  Administered 2022-11-21 – 2022-11-22 (×2): 100 mg via ORAL
  Filled 2022-11-21 (×2): qty 1

## 2022-11-21 MED ORDER — INSULIN GLARGINE-YFGN 100 UNIT/ML ~~LOC~~ SOLN
25.0000 [IU] | Freq: Every day | SUBCUTANEOUS | Status: DC
  Administered 2022-11-21: 25 [IU] via SUBCUTANEOUS
  Filled 2022-11-21 (×2): qty 0.25

## 2022-11-21 MED ORDER — VANCOMYCIN HCL 1250 MG/250ML IV SOLN
1250.0000 mg | INTRAVENOUS | Status: AC
  Administered 2022-11-21: 1250 mg via INTRAVENOUS
  Filled 2022-11-21: qty 250

## 2022-11-21 MED ORDER — DOXYCYCLINE HYCLATE 100 MG PO TABS: 100.0000 mg | ORAL_TABLET | Freq: Two times a day (BID) | ORAL | Status: DC

## 2022-11-21 NOTE — Evaluation (Addendum)
Physical Therapy Evaluation Patient Details Name: Peter Robertson MRN: [id number] DOB: 02-28-1941 Today's Date: 11/21/2022  History of Present Illness  Pt is a 82 y.o. male who presents with a sacral wound for potential infection. Currently being treated for infection with antibiotics. PMH significant for HFpEF, BPH, DM, dementia, CKD 3A, history of hydronephrosis requiring nephrostomy tubes, HTN, CAD s/p CABG and paroxysmal A-fib on Eliquis, IVH s/p fall.  Clinical Impression   Prior to hospital admission, pt lives alone and ambulatory (uses RW or rollator). Has assistance from neighbor/friend for grocery shopping and transportation. Pt is oriented to place and year. Pt is a questionable historian and displays questionable awareness of deficits/safety. Currently pt is able to perform bed mobility c/ CGA to supervision, transfers with modA+2. Pt attempted to ambulate but unable due to safety concerns of posterior leaning in standing with RW. Pt demonstrated generalized weakness which impacts their ability to mobilize safely. Pt would benefit from continuing skilled PT interventions to improve strength, functional activity tolerance, return to PLOF.    Recommendations for follow up therapy are one component of a multi-disciplinary discharge planning process, led by the attending physician.  Recommendations may be updated based on patient status, additional functional criteria and insurance authorization.  Follow Up Recommendations Can patient physically be transported by private vehicle: No     Assistance Recommended at Discharge Frequent or constant Supervision/Assistance  Patient can return home with the following  Two people to help with walking and/or transfers;Two people to help with bathing/dressing/bathroom;Assistance with cooking/housework;Help with stairs or ramp for entrance;Assist for transportation;Direct supervision/assist for medications management    Equipment Recommendations Other  (comment) (to be determined at next level of care)  Recommendations for Other Services       Functional Status Assessment Patient has had a recent decline in their functional status and demonstrates the ability to make significant improvements in function in a reasonable and predictable amount of time.     Precautions / Restrictions Precautions Precautions: Fall Restrictions Weight Bearing Restrictions: No      Mobility  Bed Mobility Overal bed mobility: Needs Assistance Bed Mobility: Supine to Sit, Sit to Supine     Supine to sit: Min guard, +2 for safety/equipment Sit to supine: Supervision        Transfers Overall transfer level: Needs assistance Equipment used: Rolling walker (2 wheels) Transfers: Sit to/from Stand Sit to Stand: Mod assist, +2 physical assistance                Ambulation/Gait Ambulation/Gait assistance: Mod assist, +2 physical assistance             General Gait Details: attempted ambulation but could not leave proximity of bed safely due to posterior lean  Stairs            Wheelchair Mobility    Modified Rankin (Stroke Patients Only)       Balance Overall balance assessment: Needs assistance Sitting-balance support: Feet supported Sitting balance-Leahy Scale: Fair   Postural control: Posterior lean Standing balance support: Bilateral upper extremity supported, Reliant on assistive device for balance Standing balance-Leahy Scale: Poor                               Pertinent Vitals/Pain Pain Assessment Pain Assessment: Faces Faces Pain Scale: Hurts a little bit Pain Location: BLE R>L, buttocks Pain Descriptors / Indicators: Burning, Discomfort Pain Intervention(s): Limited activity within patient's tolerance, Monitored during session, Repositioned  Home Living Family/patient expects to be discharged to:: Private residence Living Arrangements: Alone Available Help at Discharge: Other (Comment)  ("[name]") Type of Home: House Home Access: Stairs to enter Entrance Stairs-Rails: Right Entrance Stairs-Number of Steps: 1   Home Layout: One level Home Equipment: Agricultural consultant (2 wheels);Rollator (4 wheels);Shower seat      Prior Function Prior Level of Function : History of Falls (last six months);Needs assist;Patient poor historian/Family not available               ADLs Comments: Pt reports completing bathing/dressing/toileting by himself and [name] provides assist for groceries, tranportation to dr appts, and laundry     Hand Dominance   Dominant Hand: Right    Extremity/Trunk Assessment   Upper Extremity Assessment Upper Extremity Assessment: Generalized weakness    Lower Extremity Assessment Lower Extremity Assessment: Generalized weakness    Cervical / Trunk Assessment Cervical / Trunk Assessment: Normal  Communication   Communication: No difficulties  Cognition Arousal/Alertness: Awake/alert Behavior During Therapy: WFL for tasks assessed/performed Overall Cognitive Status: Within Functional Limits for tasks assessed                                 General Comments: Pt oriented to self, follows commands with cues/time, questionable historian and questionable awareness of deficits/safety        General Comments      Exercises     Assessment/Plan    PT Assessment Patient needs continued PT services  PT Problem List Decreased strength;Decreased range of motion;Decreased activity tolerance;Decreased coordination;Decreased mobility;Decreased knowledge of use of DME;Decreased balance;Decreased cognition;Decreased skin integrity       PT Treatment Interventions DME instruction;Gait training;Therapeutic activities;Therapeutic exercise;Stair training;Neuromuscular re-education;Functional mobility training;Patient/family education;Balance training    PT Goals (Current goals can be found in the Care Plan section)  Acute Rehab PT  Goals Patient Stated Goal: would like to go home PT Goal Formulation: With patient Time For Goal Achievement: 12/05/22 Potential to Achieve Goals: Fair    Frequency Min 2X/week     Co-evaluation PT/OT/SLP Co-Evaluation/Treatment: Yes Reason for Co-Treatment: For patient/therapist safety;To address functional/ADL transfers PT goals addressed during session: Mobility/safety with mobility;Balance;Proper use of DME OT goals addressed during session: ADL's and self-care       AM-PAC PT "6 Clicks" Mobility  Outcome Measure Help needed turning from your back to your side while in a flat bed without using bedrails?: A Little Help needed moving from lying on your back to sitting on the side of a flat bed without using bedrails?: A Little Help needed moving to and from a bed to a chair (including a wheelchair)?: A Lot Help needed standing up from a chair using your arms (e.g., wheelchair or bedside chair)?: A Lot Help needed to walk in hospital room?: Total Help needed climbing 3-5 steps with a railing? : Total 6 Click Score: 12    End of Session Equipment Utilized During Treatment: Gait belt Activity Tolerance: Patient tolerated treatment well Patient left: in bed;with bed alarm set;with call bell/phone within reach Nurse Communication: Mobility status PT Visit Diagnosis: Muscle weakness (generalized) (M62.81);History of falling (Z91.81);Unsteadiness on feet (R26.81)    Time: 1610-9604 PT Time Calculation (min) (ACUTE ONLY): 16 min   Charges:   PT Evaluation $PT Eval Moderate Complexity: 1 Mod PT Treatments $Therapeutic Activity: 8-22 mins        Lala Lund, PT, SPT  11/21/2022, 4:07 PM

## 2022-11-21 NOTE — Progress Notes (Addendum)
Pharmacy Antibiotic Note  Peter Robertson is a 82 y.o. male w/ PMH of HFpEF (EF greater than 55% 07/2022), BPH, insulin requiring type 2 diabetes, dementia, CKD 3A, history of hydronephrosis requiring nephrostomy tubes (4/2 - 10/03/2022), HTN, CAD s/p CABG and paroxysmal A-fib on Eliquis admitted on 11/18/2022 with cellulitis.  Pharmacy has been consulted for vancomycin dosing and started on ceftriaxone 1 g Q 24 hours. On 6/2, vancomycin dose was adjusted to 750 mg IV Q 24 hrs. SCr was 2.11 on 6/3. Scheduled regimen was d/c and switched to dosing by levels due to kidney function. Day 4 of therapy and patient is improving on current therapy.   Plan:  SCr was 1.62 on 6/4. Restart scheduled vancomycin at 1250 mg Q 48 hrs Continue Ceftriaxone 1 g Q 24 hours  Pharmacy will continue to follow and will adjust abx dosing whenever warranted.  Temp (24hrs), Avg:98.1 F (36.7 C), Min:97.7 F (36.5 C), Max:98.6 F (37 C)   Recent Labs  Lab 11/18/22 2112 11/18/22 2113 11/19/22 0554 11/20/22 0409 11/21/22 0451  WBC  --  11.8* 10.2  --  10.3  CREATININE  --  1.45* 1.32* 2.11* 1.62*  LATICACIDVEN 1.2  --   --   --   --   VANCORANDOM  --   --   --   --  10     Estimated Creatinine Clearance: 29.4 mL/min (A) (by C-G formula based on SCr of 1.62 mg/dL (H)).    Allergies  Allergen Reactions   Sulfamethoxazole-Trimethoprim Other (See Comments)    dizziness dizziness dizziness    Tape Rash    blisters    Antimicrobials this admission: 6/01 ceftriaxone >>  6/01 vancomycin >>   Microbiology results: 6/01 BCx: NG < 12 hours  Thank you for allowing pharmacy to be a part of this patient's care.  Francetta Found, PharmD Candidate Class of 2025  11/21/2022 7:21 AM
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
## 2022-11-21 NOTE — Inpatient Diabetes Management (Signed)
Inpatient Diabetes Program Recommendations  AACE/ADA: New Consensus Statement on Inpatient Glycemic Control (2015)  Target Ranges:  Prepandial:   less than 140 mg/dL      Peak postprandial:   less than 180 mg/dL (1-2 hours)      Critically ill patients:  140 - 180 mg/dL    Latest Reference Range & Units 11/20/22 07:59 11/20/22 11:54 11/20/22 17:14 11/20/22 21:15  Glucose-Capillary 70 - 99 mg/dL 409 (H)  3 units Novolog  227 (H)  5 units Novolog  276 (H)  8 units Novolog  219 (H)  2 units Novolog   (H): Data is abnormally high  Latest Reference Range & Units 11/21/22 07:49  Glucose-Capillary 70 - 99 mg/dL 811 (H)  (H): Data is abnormally high   Admit with: Infected decubitus ulcer   History: DM, CHF, CKD  Home DM Meds: Lantus 30 units Daily       Jardiance 25 mg daily  Current Orders: Novolog Moderate Correction Scale/ SSI (0-15 units) TID AC + HS    MD- Note CBG 306 this AM.  Pt takes Lantus 30 units Daily at home.  Please consider starting Semglee 25 units Daily this AM (80% home dose)    --Will follow patient during hospitalization--  Ambrose Finland RN, MSN, CDCES Diabetes Coordinator Inpatient Glycemic Control Team Team Pager: [phone] (8a-5p)

## 2022-11-21 NOTE — Plan of Care (Signed)
  Problem: Pain Managment: Goal: General experience of comfort will improve Outcome: Progressing   Problem: Coping: Goal: Level of anxiety will decrease Outcome: Progressing   Problem: Nutrition: Goal: Adequate nutrition will be maintained Outcome: Progressing   Problem: Activity: Goal: Risk for activity intolerance will decrease Outcome: Progressing   Problem: Safety: Goal: Ability to remain free from injury will improve Outcome: Progressing   Problem: Skin Integrity: Goal: Risk for impaired skin integrity will decrease Outcome: Progressing   Problem: Elimination: Goal: Will not experience complications related to bowel motility Outcome: Progressing

## 2022-11-21 NOTE — Evaluation (Signed)
Occupational Therapy Evaluation Patient Details Name: Peter Robertson MRN: [id number] DOB: [birth year] Today's Date: 11/21/2022   History of Present Illness Pt is a 82 y.o. male who presents with a sacral wound for potential infection. Currently being treated for infection with antibiotics. PMH significant for HFpEF, BPH, DM, dementia, CKD 3A, history of hydronephrosis requiring nephrostomy tubes, HTN, CAD s/p CABG and paroxysmal A-fib on Eliquis, IVH s/p fall.   Clinical Impression   Pt was seen for OT evaluation this date. Prior to hospital admission, pt reports independent with basic ADL tasks and requiring assist from [place] for groceries, laundry, transportation to/from dt appts. Pt reports 1+1 steps into home and shower chair he could use if needed. Pt presents to acute OT demonstrating impaired ADL performance and functional mobility 2/2 decreased strength, balance, activity tolerance, cognition, and sacral wounds (See OT problem list for additional functional deficits). Pt currently requires MIN A +2 safety for bed mobility, MOD A +2 for STS transfers with RW, and MOD +2 for taking a couple steps forward and backwards with RW. MOD-MAX A for LB ADL.  Pt may benefit from skilled OT services to address noted impairments and functional limitations (see below for any additional details) in order to maximize safety and independence while minimizing falls risk and caregiver burden.     Recommendations for follow up therapy are one component of a multi-disciplinary discharge planning process, led by the attending physician.  Recommendations may be updated based on patient status, additional functional criteria and insurance authorization.   Assistance Recommended at Discharge Frequent or constant Supervision/Assistance  Patient can return home with the following Two people to help with walking and/or transfers;A lot of help with bathing/dressing/bathroom;Assist for transportation;Assistance with  cooking/housework;Direct supervision/assist for medications management;Direct supervision/assist for financial management;Help with stairs or ramp for entrance    Functional Status Assessment  Patient has had a recent decline in their functional status and demonstrates the ability to make significant improvements in function in a reasonable and predictable amount of time.  Equipment Recommendations  Other (comment) (defer to next venue)    Recommendations for Other Services       Precautions / Restrictions Precautions Precautions: Fall Restrictions Weight Bearing Restrictions: No      Mobility Bed Mobility Overal bed mobility: Needs Assistance Bed Mobility: Supine to Sit, Sit to Supine     Supine to sit: Min assist, +2 for safety/equipment Sit to supine: Supervision        Transfers Overall transfer level: Needs assistance Equipment used: Rolling walker (2 wheels) Transfers: Sit to/from Stand Sit to Stand: Mod assist, +2 physical assistance                  Balance Overall balance assessment: Needs assistance Sitting-balance support: Feet supported Sitting balance-Leahy Scale: Fair   Postural control: Posterior lean Standing balance support: Bilateral upper extremity supported, Reliant on assistive device for balance Standing balance-Leahy Scale: Poor                             ADL either performed or assessed with clinical judgement   ADL                                         General ADL Comments: Pt requires MOD A for LB ADL Tasks, setup for grooming, MOD A +2 for SPT  to [facility] with RW     Vision         Perception     Praxis      Pertinent Vitals/Pain Pain Assessment Pain Assessment: Faces Faces Pain Scale: Hurts a little bit Pain Location: BLE R>L, buttocks Pain Descriptors / Indicators: Burning, Discomfort Pain Intervention(s): Limited activity within patient's tolerance, Monitored during session,  Repositioned     Hand Dominance Right   Extremity/Trunk Assessment Upper Extremity Assessment Upper Extremity Assessment: Generalized weakness   Lower Extremity Assessment Lower Extremity Assessment: Generalized weakness   Cervical / Trunk Assessment Cervical / Trunk Assessment: Normal   Communication Communication Communication: No difficulties   Cognition Arousal/Alertness: Awake/alert Behavior During Therapy: WFL for tasks assessed/performed Overall Cognitive Status: No family/caregiver present to determine baseline cognitive functioning                                 General Comments: Pt oriented to self, follows commands with cues/time, questionable historian and questionable awareness of deficits/safety     General Comments       Exercises     Shoulder Instructions      Home Living Family/patient expects to be discharged to:: Private residence Living Arrangements: Alone Available Help at Discharge: Other (Comment) ("[name]") Type of Home: House Home Access: Stairs to enter [employer] of Steps: 1 Entrance Stairs-Rails: Right Home Layout: One level     Bathroom Shower/Tub: Chief Strategy Officer: Standard Bathroom Accessibility: Yes   Home Equipment: Agricultural consultant (2 wheels);Rollator (4 wheels);Shower seat          Prior Functioning/Environment Prior Level of Function : History of Falls (last six months);Needs assist;Patient poor historian/Family not available               ADLs Comments: Pt reports completing bathing/dressing/toileting by himself and [name] provides assist for groceries, tranportation to dr appts, and laundry        OT Problem List: Decreased strength;Decreased cognition;Decreased safety awareness;Impaired balance (sitting and/or standing);Decreased knowledge of use of DME or AE;Decreased activity tolerance      OT Treatment/Interventions: Self-care/ADL training;Therapeutic  exercise;Therapeutic activities;Patient/family education;Energy conservation;Cognitive remediation/compensation;DME and/or AE instruction;Balance training    OT Goals(Current goals can be found in the care plan section) Acute Rehab OT Goals Patient Stated Goal: go home OT Goal Formulation: With patient Time For Goal Achievement: 12/05/22 Potential to Achieve Goals: Good  OT Frequency: Min 1X/week    Co-evaluation PT/OT/SLP Co-Evaluation/Treatment: Yes Reason for Co-Treatment: For patient/therapist safety;To address functional/ADL transfers PT goals addressed during session: Mobility/safety with mobility;Balance;Proper use of DME OT goals addressed during session: ADL's and self-care      AM-PAC OT "6 Clicks" Daily Activity     Outcome Measure Help from another person eating meals?: None Help from another person taking care of personal grooming?: A Little Help from another person toileting, which includes using toliet, bedpan, or urinal?: A Lot Help from another person bathing (including washing, rinsing, drying)?: A Lot Help from another person to put on and taking off regular upper body clothing?: A Little Help from another person to put on and taking off regular lower body clothing?: A Lot 6 Click Score: 16   End of Session Equipment Utilized During Treatment: Gait belt;Rolling walker (2 wheels) Nurse Communication: Mobility status  Activity Tolerance: Patient tolerated treatment well Patient left: in bed;with call bell/phone within reach;with bed alarm set  OT Visit Diagnosis: Other abnormalities  of gait and mobility (R26.89);Repeated falls (R29.6);Muscle weakness (generalized) (M62.81)                Time: 1610-9604 OT Time Calculation (min): 13 min Charges:  OT General Charges $OT Visit: 1 Visit OT Evaluation $OT Eval Moderate Complexity: 1 Mod  Arman Filter., MPH, MS, OTR/L ascom [phone] 11/21/22, 3:34 PM

## 2022-11-21 NOTE — Care Management Important Message (Signed)
Important Message  Patient Details  Name: Peter Robertson MRN: [id number] Date of Birth: [birth year]   Medicare Important Message Given:  N/A - LOS <3 / Initial given by admissions     [name] 11/21/2022, 9:30 AM

## 2022-11-22 DIAGNOSIS — L8994 Pressure ulcer of unspecified site, stage 4: Secondary | ICD-10-CM | POA: Diagnosis not present

## 2022-11-22 DIAGNOSIS — L089 Local infection of the skin and subcutaneous tissue, unspecified: Secondary | ICD-10-CM | POA: Diagnosis not present

## 2022-11-22 DIAGNOSIS — L03317 Cellulitis of buttock: Secondary | ICD-10-CM

## 2022-11-22 LAB — CULTURE, BLOOD (ROUTINE X 2)
Culture: NO GROWTH
Culture: NO GROWTH

## 2022-11-22 LAB — GLUCOSE, CAPILLARY
Glucose-Capillary: 169 mg/dL — ABNORMAL HIGH (ref 70–99)
Glucose-Capillary: 204 mg/dL — ABNORMAL HIGH (ref 70–99)
Glucose-Capillary: 240 mg/dL — ABNORMAL HIGH (ref 70–99)
Glucose-Capillary: 211 mg/dL — ABNORMAL HIGH (ref 70–99)

## 2022-11-22 MED ORDER — AMOXICILLIN-POT CLAVULANATE 875-125 MG PO TABS
1.0000 | ORAL_TABLET | Freq: Two times a day (BID) | ORAL | Status: DC
  Administered 2022-11-23 – 2022-11-27 (×8): 1 via ORAL
  Filled 2022-11-22 (×8): qty 1

## 2022-11-22 MED ORDER — INSULIN GLARGINE-YFGN 100 UNIT/ML ~~LOC~~ SOLN
30.0000 [IU] | Freq: Every day | SUBCUTANEOUS | Status: DC
  Administered 2022-11-22 – 2022-11-27 (×6): 30 [IU] via SUBCUTANEOUS
  Filled 2022-11-22 (×6): qty 0.3

## 2022-11-22 MED ORDER — DOXYCYCLINE HYCLATE 100 MG PO TABS
100.0000 mg | ORAL_TABLET | Freq: Two times a day (BID) | ORAL | Status: DC
  Administered 2022-11-23 – 2022-11-27 (×9): 100 mg via ORAL
  Filled 2022-11-22 (×9): qty 1

## 2022-11-22 NOTE — Plan of Care (Signed)
  Problem: Skin Integrity: Goal: Risk for impaired skin integrity will decrease Outcome: Progressing   Problem: Education: Goal: Knowledge of General Education information will improve Description: Including pain rating scale, medication(s)/side effects and non-pharmacologic comfort measures Outcome: Progressing   Problem: Safety: Goal: Ability to remain free from injury will improve Outcome: Progressing   Problem: Skin Integrity: Goal: Risk for impaired skin integrity will decrease Outcome: Progressing

## 2022-11-22 NOTE — Care Management Important Message (Signed)
Important Message  Patient Details  Name: Peter Robertson MRN: [id number] Date of Birth: Oct 01, 1940   Medicare Important Message Given:  Yes     Olegario Messier A Mykal Kirchman 11/22/2022, 11:08 AM

## 2022-11-22 NOTE — Plan of Care (Signed)
  Problem: Skin Integrity: Goal: Skin integrity will improve Outcome: Progressing   Problem: Nutritional: Goal: Maintenance of adequate nutrition will improve Outcome: Progressing   Problem: Metabolic: Goal: Ability to maintain appropriate glucose levels will improve Outcome: Progressing   Problem: Activity: Goal: Risk for activity intolerance will decrease Outcome: Progressing   Problem: Safety: Goal: Ability to remain free from injury will improve Outcome: Progressing   Problem: Elimination: Goal: Will not experience complications related to bowel motility Outcome: Progressing

## 2022-11-22 NOTE — [place] FL2 (Signed)
[place] MEDICAID FL2 LEVEL OF CARE FORM     IDENTIFICATION  Patient Name: Peter Robertson Birthdate: [birth year] Sex: male Admission Date (Current Location): 11/18/2022  [facility] and [place] Number:  Chiropodist and Address:  [facility], [address] [place], [place] [address]      Provider Number: [id number]  Attending Physician Name and Address:  Tresa Moore, MD  Relative Name and Phone Number:  Babette Relic  [phone]    Current Level of Care: Hospital Recommended Level of Care: Skilled Nursing Facility Prior Approval Number:    Date Approved/Denied:   PASRR Number: Pending  Discharge Plan: SNF    Current Diagnoses: Patient Active Problem List   Diagnosis Date Noted   Protein-calorie malnutrition, severe 11/21/2022   Renal hematoma, left 11/19/2022   Chronic anticoagulation 11/18/2022   Infected decubitus ulcer 11/18/2022   Hydronephrosis of left kidney s/p nephrostomy tubes (4/2 - 10/03/2022) 11/18/2022   Frequent falls 11/18/2022   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 11/18/2022   Paroxysmal atrial fibrillation (HCC) 11/18/2022   Ureteropelvic junction (UPJ) obstruction 09/10/2022   (HFpEF) heart failure with preserved ejection fraction (HCC) 09/13/2021   Chronic diastolic (congestive) heart failure (HCC) 05/24/2021   Thyroid nodule greater than or equal to 1 cm in diameter incidentally noted on imaging study    Head injury    Weakness generalized    Acute on chronic diastolic CHF (congestive heart failure) (HCC) 05/07/2021   Acute respiratory failure with hypoxia (HCC) 05/07/2021   Aspiration pneumonia (HCC) 05/06/2021   Pneumonia due to COVID-19 virus 05/06/2021   Dementia without behavioral disturbance (HCC) 05/06/2021   Fall at home, initial encounter 05/06/2021   Hyperlipidemia    Hypertension    Elevated troponin    Chronic diastolic CHF (congestive heart failure) (HCC)     Depression    Stage 3a chronic kidney disease (HCC)    Thyroid nodule    Fall    Encounter for psychological evaluation 02/15/2020   AKI (acute kidney injury) (HCC) 02/12/2020   Vertigo 10/11/2019   BPPV (benign paroxysmal positional vertigo) 10/10/2019   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) 10/10/2019   Uncontrolled type 2 diabetes mellitus with hyperglycemia, with long-term current use of insulin (HCC) 10/10/2019   BPH (benign prostatic hyperplasia) 10/10/2019   Thrombocytopenia (HCC) 10/10/2019   Dark stools 10/10/2019   Unstable angina (HCC) 05/20/2018   Chest pain 01/30/2017    Orientation RESPIRATION BLADDER Height & Weight     Self, Time, Situation, Place  Normal Incontinent Weight: 60.1 kg Height:  5\' 6"  (167.6 cm)  BEHAVIORAL SYMPTOMS/MOOD NEUROLOGICAL BOWEL NUTRITION STATUS      Incontinent Diet (see DC summary)  AMBULATORY STATUS COMMUNICATION OF NEEDS Skin   Extensive Assist Verbally Normal, PU Stage and Appropriate Care (sacral ulcer stage 2 foam dressing)                       Personal Care Assistance Level of Assistance  Bathing, Feeding, Dressing Bathing Assistance: Maximum assistance Feeding assistance: Limited assistance Dressing Assistance: Maximum assistance     Functional Limitations Info             SPECIAL CARE FACTORS FREQUENCY  PT (By licensed PT), OT (By licensed OT)     PT Frequency: 5 times per week OT Frequency: 5 times perf week            Contractures Contractures Info: Not  present    Additional Factors Info  Code Status, Allergies Code Status Info: DNR Allergies Info: Sulfamethoxazole-trimethoprim, Tape           Current Medications (11/22/2022):  This is the current hospital active medication list Current Facility-Administered Medications  Medication Dose Route Frequency Provider Last Rate Last Admin   0.9 %  sodium chloride infusion   Intravenous PRN Lolita Patella B, MD 10 mL/hr at 11/19/22  2222 New Bag at 11/19/22 2222   acetaminophen (TYLENOL) tablet 650 mg  650 mg Oral Q6H PRN Lolita Patella B, MD   650 mg at 11/22/22 0906   albuterol (PROVENTIL) (2.5 MG/3ML) 0.083% nebulizer solution 2.5 mg  2.5 mg Nebulization Q4H PRN Lolita Patella B, MD       alum & mag hydroxide-simeth (MAALOX/MYLANTA) 200-200-20 MG/5ML suspension 15 mL  15 mL Oral Q6H PRN Tresa Moore, MD       [START ON 11/23/2022] amoxicillin-clavulanate (AUGMENTIN) 875-125 MG per tablet 1 tablet  1 tablet Oral Q12H Sreenath, Sudheer B, MD       ascorbic acid (VITAMIN C) tablet 500 mg  500 mg Oral BID Georgeann Oppenheim, Sudheer B, MD   500 mg at 11/22/22 0904   atorvastatin (LIPITOR) tablet 40 mg  40 mg Oral QAC supper Lindajo Royal V, MD   40 mg at 11/21/22 1654   cefTRIAXone (ROCEPHIN) 1 g in sodium chloride 0.9 % 100 mL IVPB  1 g Intravenous Q24H Sreenath, Sudheer B, MD 200 mL/hr at 11/21/22 2328 1 g at 11/21/22 2328   docusate sodium (COLACE) capsule 100 mg  100 mg Oral BID PRN Mansy, Jan A, MD   100 mg at 11/22/22 0904   [START ON 11/23/2022] doxycycline (VIBRA-TABS) tablet 100 mg  100 mg Oral Q12H Paytes, Austin A, RPH       feeding supplement (ENSURE ENLIVE / ENSURE PLUS) liquid 237 mL  237 mL Oral TID BM Sreenath, Sudheer B, MD   237 mL at 11/22/22 1357   furosemide (LASIX) tablet 20 mg  20 mg Oral Daily Lindajo Royal V, MD   20 mg at 11/22/22 0904   gabapentin (NEURONTIN) capsule 300 mg  300 mg Oral TID Lolita Patella B, MD   300 mg at 11/22/22 1610   hydrocortisone cream 1 %   Topical BID Lolita Patella B, MD   Given at 11/22/22 0903   insulin aspart (novoLOG) injection 0-15 Units  0-15 Units Subcutaneous TID WC Lolita Patella B, MD   5 Units at 11/22/22 1226   insulin aspart (novoLOG) injection 0-5 Units  0-5 Units Subcutaneous QHS Lolita Patella B, MD   2 Units at 11/21/22 2335   insulin glargine-yfgn (SEMGLEE) injection 30 Units  30 Units Subcutaneous Daily Lolita Patella B, MD   30 Units at  11/22/22 1016   irbesartan (AVAPRO) tablet 75 mg  75 mg Oral Daily Lindajo Royal V, MD   75 mg at 11/21/22 0956   isosorbide mononitrate (IMDUR) 24 hr tablet 60 mg  60 mg Oral Daily Lindajo Royal V, MD   60 mg at 11/22/22 9604   lactated ringers infusion   Intravenous Continuous Lolita Patella B, MD 100 mL/hr at 11/22/22 0730 New Bag at 11/22/22 0730   leptospermum manuka honey (MEDIHONEY) paste 1 Application  1 Application Topical Daily Lolita Patella B, MD   1 Application at 11/22/22 0903   metoprolol succinate (TOPROL-XL) 24 hr tablet 100 mg  100 mg Oral Daily Andris Baumann, MD   100  mg at 11/22/22 0904   morphine (PF) 2 MG/ML injection 2 mg  2 mg Intravenous Q2H PRN Andris Baumann, MD   2 mg at 11/19/22 2008   multivitamin with minerals tablet 1 tablet  1 tablet Oral Daily Lolita Patella B, MD   1 tablet at 11/22/22 0904   nitroGLYCERIN (NITROSTAT) SL tablet 0.4 mg  0.4 mg Sublingual Q5 min PRN Andris Baumann, MD       ondansetron [facility]) tablet 4 mg  4 mg Oral Q6H PRN Andris Baumann, MD       Or   ondansetron [facility]) injection 4 mg  4 mg Intravenous Q6H PRN Andris Baumann, MD       Oral care mouth rinse  15 mL Mouth Rinse PRN Georgeann Oppenheim, Sudheer B, MD       oxyCODONE (Oxy IR/ROXICODONE) immediate release tablet 5 mg  5 mg Oral Q4H PRN Andris Baumann, MD   5 mg at 11/22/22 0218   pantoprazole (PROTONIX) EC tablet 40 mg  40 mg Oral Daily Andris Baumann, MD   40 mg at 11/22/22 0904   sertraline (ZOLOFT) tablet 50 mg  50 mg Oral Daily Andris Baumann, MD   50 mg at 11/22/22 0904   tamsulosin (FLOMAX) capsule 0.8 mg  0.8 mg Oral QHS Lindajo Royal V, MD   0.8 mg at 11/21/22 2112   zinc sulfate capsule 220 mg  220 mg Oral Daily Lolita Patella B, MD   220 mg at 11/22/22 1610     Discharge Medications: Please see discharge summary for a list of discharge medications.  Relevant Imaging Results:  Relevant Lab Results:   Additional Information SS# [id number]  Marlowe Sax, RN

## 2022-11-22 NOTE — Progress Notes (Signed)
                                                     Palliative Care Progress Note   Patient Name: Peter Robertson       Date: 11/22/2022 DOB: [birth year]  Age: 82 y.o. MRN#: [id number] Attending Physician: Tresa Moore, MD Primary Care Physician: Jenell [place], MD Admit Date: 11/18/2022  Chart reviewed.  Patient assessed.  He is resting comfortably in bed in no apparent distress.  He awakens easily and has no acute complaints at this time.  No acute palliative needs today.  PMT will continue to follow and support patient throughout his hospitalization.  [name], patient's next of kin decision maker, has PMT contact information and has been advised to reach out to palliative team with any questions or concerns.  Samara Deist L. Manon Hilding, FNP-BC Palliative Medicine Team Team Phone # [phone]  No charge
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
## 2022-11-22 NOTE — TOC Progression Note (Signed)
Transition of Care [facility]) - Progression Note    Patient Details  Name: Peter Robertson MRN: [id number] Date of Birth: 1941/05/15  [facility] CM/SW Contact  Marlowe Sax, RN Phone Number: 11/22/2022, 3:58 PM  Clinical Narrative:     Met with the patient he lives alone and has family stop by and check on him He is agreeable to go to STR prior to returning home   Expected Discharge Plan: Skilled Nursing Facility Barriers to Discharge: SNF Pending bed offer, Insurance Authorization  Expected Discharge Plan and Services       Living arrangements for the past 2 months: Single Family Home                                       Social Determinants of Health (SDOH) Interventions SDOH Screenings   Tobacco Use: Medium Risk (05/13/2021)    Readmission Risk Interventions     No data to display

## 2022-11-22 NOTE — Progress Notes (Signed)
Pharmacy Antibiotic Note  Peter Robertson is a 82 y.o. male w/ PMH of HFpEF (EF greater than 55% 07/2022), BPH, insulin requiring type 2 diabetes, dementia, CKD 3A, history of hydronephrosis requiring nephrostomy tubes (4/2 - 10/03/2022), HTN, CAD s/p CABG and paroxysmal A-fib on Eliquis admitted on 11/18/2022 with cellulitis.  Pharmacy has been consulted for vancomycin dosing and started on ceftriaxone 1 g Q 24 hours. On 6/2, vancomycin dose was adjusted to 750 mg IV Q 24 hrs. SCr was 2.11 on 6/3. Scheduled regimen was d/c and switched to dosing by levels due to kidney function. Day 5 of therapy and patient is improving on current therapy.   Plan:  SCr was 1.62 on 6/4. After discussion with provider patient will transition to oral ABX following 5 days of IV ABX. Doxycycline 100 mg BID will be started on 11/23/2022  Continue vancomycin at 1250 mg, will be stopped on 11/22/2022 @ 2359 Continue Ceftriaxone 1 g, will be stopped on 11/22/2022 @ 2359  Pharmacy will continue to follow and will adjust abx dosing whenever warranted.  Temp (24hrs), Avg:98.2 F (36.8 C), Min:98 F (36.7 C), Max:98.7 F (37.1 C)   Recent Labs  Lab 11/18/22 2112 11/18/22 2113 11/19/22 0554 11/20/22 0409 11/21/22 0451  WBC  --  11.8* 10.2  --  10.3  CREATININE  --  1.45* 1.32* 2.11* 1.62*  LATICACIDVEN 1.2  --   --   --   --   VANCORANDOM  --   --   --   --  10     Estimated Creatinine Clearance: 29.4 mL/min (A) (by C-G formula based on SCr of 1.62 mg/dL (H)).    Allergies  Allergen Reactions   Sulfamethoxazole-Trimethoprim Other (See Comments)    dizziness dizziness dizziness    Tape Rash    blisters    Antimicrobials this admission: 6/01 ceftriaxone >>  6/01 vancomycin >>   Microbiology results: 6/01 BCx: NG < 12 hours  Thank you for allowing pharmacy to be a part of this patient's care.  Francetta Found, PharmD Candidate Class of 2025  11/21/2022 7:21 AM

## 2022-11-23 DIAGNOSIS — F0390 Unspecified dementia without behavioral disturbance: Secondary | ICD-10-CM | POA: Diagnosis not present

## 2022-11-23 DIAGNOSIS — L089 Local infection of the skin and subcutaneous tissue, unspecified: Secondary | ICD-10-CM | POA: Diagnosis not present

## 2022-11-23 DIAGNOSIS — L8994 Pressure ulcer of unspecified site, stage 4: Secondary | ICD-10-CM | POA: Diagnosis not present

## 2022-11-23 DIAGNOSIS — L89153 Pressure ulcer of sacral region, stage 3: Secondary | ICD-10-CM | POA: Diagnosis not present

## 2022-11-23 DIAGNOSIS — E119 Type 2 diabetes mellitus without complications: Secondary | ICD-10-CM | POA: Diagnosis not present

## 2022-11-23 DIAGNOSIS — L03317 Cellulitis of buttock: Secondary | ICD-10-CM | POA: Diagnosis not present

## 2022-11-23 LAB — GLUCOSE, CAPILLARY
Glucose-Capillary: 131 mg/dL — ABNORMAL HIGH (ref 70–99)
Glucose-Capillary: 212 mg/dL — ABNORMAL HIGH (ref 70–99)
Glucose-Capillary: 293 mg/dL — ABNORMAL HIGH (ref 70–99)
Glucose-Capillary: 182 mg/dL — ABNORMAL HIGH (ref 70–99)

## 2022-11-23 LAB — CULTURE, BLOOD (ROUTINE X 2)

## 2022-11-23 MED ORDER — ENOXAPARIN SODIUM 30 MG/0.3ML IJ SOSY
30.0000 mg | PREFILLED_SYRINGE | INTRAMUSCULAR | Status: DC
  Administered 2022-11-23: 30 mg via SUBCUTANEOUS
  Filled 2022-11-23: qty 0.3

## 2022-11-23 NOTE — TOC Progression Note (Signed)
Transition of Care [facility]) - Progression Note    Patient Details  Name: Peter Robertson MRN: [id number] Date of Birth: 11-12-1940  Transition of Care [facility]) CM/SW Contact  Garret Reddish, RN Phone Number: 11/23/2022, 9:19 PM  Clinical Narrative:   Attempted to speak with patient and his friend about SNF bed offers.  I was unable to speak with patient or his friend.  TOC will continue in efforts to speak with patient about bed offers.    TOC will continue to follow for discharge planning.      Expected Discharge Plan: Skilled Nursing Facility Barriers to Discharge: SNF Pending bed offer, Insurance Authorization  Expected Discharge Plan and Services       Living arrangements for the past 2 months: Single Family Home                                       Social Determinants of Health (SDOH) Interventions SDOH Screenings   Tobacco Use: Medium Risk (05/13/2021)    Readmission Risk Interventions     No data to display

## 2022-11-23 NOTE — Progress Notes (Signed)
Occupational Therapy Treatment Patient Details Name: Peter Robertson MRN: [id number] DOB: 04-02-1941 Today's Date: 11/23/2022   History of present illness Pt is a 82 y.o. male who presents with a sacral wound for potential infection. Currently being treated for infection with antibiotics. PMH significant for HFpEF, BPH, DM, dementia, CKD 3A, history of hydronephrosis requiring nephrostomy tubes, HTN, CAD s/p CABG and paroxysmal A-fib on Eliquis, IVH s/p fall.   OT comments  Pt seen for OT tx. Pt received side lying in recliner with BLE elevated after lunch, sleeping. Pt wakes easily and agreeable to OT session. Pt complains of constipation. Noted to be incontinent of small BM. MAX A for pericare with pt able to assist with repositioning for improved access. MIN A for repositioning into midline and scooting back in recliner. Small sacral wound noted to be uncovered. RN notified. Pt demonstrates progress, continues to benefit from skilled OT services. Left with PT for further tx.    Recommendations for follow up therapy are one component of a multi-disciplinary discharge planning process, led by the attending physician.  Recommendations may be updated based on patient status, additional functional criteria and insurance authorization.    Assistance Recommended at Discharge Frequent or constant Supervision/Assistance  Patient can return home with the following  Two people to help with walking and/or transfers;A lot of help with bathing/dressing/bathroom;Assist for transportation;Assistance with cooking/housework;Direct supervision/assist for medications management;Direct supervision/assist for financial management;Help with stairs or ramp for entrance   Equipment Recommendations  BSC/3in1    Recommendations for Other Services      Precautions / Restrictions Precautions Precautions: Fall Restrictions Weight Bearing Restrictions: No       Mobility Bed Mobility               General bed  mobility comments: NT, up in recliner    Transfers                   General transfer comment: Pt required CGA-MIN A for repositioning in recliner for scooting back     Balance Overall balance assessment: Needs assistance Sitting-balance support: Feet supported Sitting balance-Leahy Scale: Fair                                     ADL either performed or assessed with clinical judgement   ADL                                         General ADL Comments: Pt required MAX A for pericare after incontinence of BM while sidelying in recliner. Pt set up for seated grooming and required MIN A for UB dressing.    Extremity/Trunk Assessment              Vision       Perception     Praxis      Cognition Arousal/Alertness: Awake/alert Behavior During Therapy: WFL for tasks assessed/performed Overall Cognitive Status: No family/caregiver present to determine baseline cognitive functioning                                          Exercises      Shoulder Instructions       General Comments sacral wound noted not  to be covered while OT assisted with pericare. RN notified.    Pertinent Vitals/ Pain       Pain Assessment Pain Assessment: Faces Faces Pain Scale: Hurts little more Pain Location: buttocks Pain Descriptors / Indicators: Guarding, Grimacing Pain Intervention(s): Limited activity within patient's tolerance, Monitored during session, Repositioned, Patient requesting pain meds-RN notified  Home Living                                          Prior Functioning/Environment              Frequency  Min 1X/week        Progress Toward Goals  OT Goals(current goals can now be found in the care plan section)  Progress towards OT goals: Progressing toward goals  Acute Rehab OT Goals Patient Stated Goal: go home OT Goal Formulation: With patient Time For Goal Achievement:  12/05/22 Potential to Achieve Goals: Good  Plan Discharge plan remains appropriate;Frequency remains appropriate    Co-evaluation                 AM-PAC OT "6 Clicks" Daily Activity     Outcome Measure   Help from another person eating meals?: None Help from another person taking care of personal grooming?: A Little Help from another person toileting, which includes using toliet, bedpan, or urinal?: A Lot Help from another person bathing (including washing, rinsing, drying)?: A Lot Help from another person to put on and taking off regular upper body clothing?: A Little Help from another person to put on and taking off regular lower body clothing?: A Lot 6 Click Score: 16    End of Session    OT Visit Diagnosis: Other abnormalities of gait and mobility (R26.89);Repeated falls (R29.6);Muscle weakness (generalized) (M62.81)   Activity Tolerance Patient tolerated treatment well   Patient Left in chair;Other (comment) (seated in recliner with PT for additional session)   Nurse Communication Other (comment) (wound)        Time: 1610-9604 OT Time Calculation (min): 12 min  Charges: OT General Charges $OT Visit: 1 Visit OT Treatments $Self Care/Home Management : 8-22 mins  Arman Filter., MPH, MS, OTR/L ascom [phone] 11/23/22, 2:38 PM

## 2022-11-23 NOTE — Progress Notes (Signed)
                                                     Palliative Care Progress Note, Assessment & Plan   Patient Name: Peter Robertson       Date: 11/23/2022 DOB: 05-Apr-1941  Age: 82 y.o. MRN#: [id number] Attending Physician: Darlin Priestly, MD Primary Care Physician: Jenell Milliner, MD Admit Date: 11/18/2022  Subjective: Patient is out of bed and sitting in recliner.  He is in no apparent distress.  He acknowledges my presence and is able to make his wishes known.  He is sleeping but is easily aroused.  He has no acute complaints today.  No family or friends present at bedside during my visit.  HPI: 82 y.o. male  with past medical history of HFpEF, BPH, type 2 diabetes (insulin-dependent), dementia, CKD (stage IIIa), hydronephrosis requiring nephrostomy tubes, HTN, CAD s/p CABG, and paroxysmal A-fib (Eliquis) admitted on 11/18/2022 with concern for wound infection after patient reported burning sensation to the areas.    Patient is being treated with IV antibiotics and aggressive wound care.  No need for surgical consult at this time.   PMT was consulted to discuss goals of care.  Summary of counseling/coordination of care: After reviewing the patient's chart and assessing the patient at bedside, I spoke with patient's surrogate decision maker [name] over the phone.  We discussed plan and goals of care for patient.  Given that patient is agreeable and search is in place for STR bed, I reviewed the concept of a MOST form.    We discussed documenting boundaries of care for patient beyond DNR - limited status.  [name] has completed a 5 Wishes form with patient in the past and plans to bring that to the hospital tomorrow. From discussions she had with patient when he had "all of his mental faculties", the patient stated he would not want to ever be placed on a ventilator and  would never be accepting of artificial nutrition or hydration.  He would never want a feeding tube placed.  [name] plans to uphold these wishes and document them in the MOST form tomorrow.  Hard copy of MOST form left at bedside.  Electronic copy sent to [facility] for review.  She plans to be bedside tomorrow and wishes to complete this MOST form at that time.  I am off service tomorrow but will ensure a member of the PMT team is available to complete when appropriate tomorrow.  PMT will continue to follow and support patient. [name] has PMT contact info and was encouraged to contact PMT for any future palliative needs or concerns.   Physical Exam Vitals reviewed.  Constitutional:      General: He is not in acute distress.    Appearance: He is normal weight. He is not ill-appearing.  HENT:     Head: Normocephalic.     Mouth/Throat:     Mouth: Mucous membranes are moist.     Comments: Missing teeth Neurological:     Mental Status: He is alert.             Total Time 35 minutes   Leno Mathes L. Manon Hilding, FNP-BC Palliative Medicine Team Team Phone # [phone]

## 2022-11-23 NOTE — Progress Notes (Addendum)
PROGRESS NOTE    Peter Robertson  ZOX:[id number] DOB: Mar 25, 1941 DOA: 11/18/2022 PCP: Jenell Milliner, MD  142A/142A-AA  LOS: 5 days   Brief hospital course:   Assessment & Plan: 82 y.o. male with medical history significant for HFpEF (EF greater than 55% 07/2022), BPH, insulin requiring type 2 diabetes, dementia, CKD 3A, history of hydronephrosis requiring nephrostomy tubes (4/2 - 10/03/2022), HTN, CAD s/p CABG and paroxysmal A-fib on Eliquis, hospitalized a month ago at [facility] from 4/13 to 4/20 with respiratory failure secondary to influenza, discharged on room air, and history of a hospitalization in January 2024 for traumatic intraventricular hemorrhage requiring external ventricular drain, presently deconditioned and with sacral decubitus ulcers who was brought by EMS due to concern for wound infection after patient reported burning to the areas.  Patient lives alone and family checks in on him.No reported fevers or chills. ED course and data review: Mildly tachypneic to 23 in the ED but otherwise unremarkable vitals.  Labs with WBC 11,800 with lactic acid 1.2.  Hemoglobin 9.2, slightly up from baseline of 8.2 when discharged a month ago.     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  Looking for skilled nursing facility   Hydronephrosis of left kidney s/p nephrostomy tubes (4/2 - 10/03/2022) Subcapsular fluid collection left kidney of uncertain etiology BPH CT abdomen and pelvis showing "subcapsular left renal fluid collection has peripheral enhancement and is heterogeneous measuring 6 x 6.5 x 6.5 cm. This is nonspecific, however suspicious for abscess" Seen by urology, no intervention indicated   Stage 3a chronic kidney disease (HCC) Renal function at baseline   Chronic diastolic CHF (congestive heart failure) (HCC) Clinically euvolemic Continue valsartan as irbesartan, metoprolol, isosorbide, Lasix   Dementia without behavioral disturbance (HCC) Delirium precautions   Uncontrolled type 2 diabetes mellitus with hyperglycemia, with long-term current use of insulin (HCC) --cont glargine 30u daily --ACHS and SSI   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) No complaints of chest pain, EKG nonacute Continue nitroglycerin, metoprolol, Imdur, atorvastatin.      DVT prophylaxis: Lovenox SQ Code Status: DNR  Family Communication:  Level of care: Med-Surg Dispo:   The patient is from: home Anticipated d/c is to: SNF rehab Anticipated d/c date is: whenever bed available   Subjective and Interval History:  Pt reported eating ok.   Objective: Vitals:   11/22/22 1515 11/23/22 0003 11/23/22 0742 11/23/22 1517  BP: 135/68 128/71 137/63 133/61  Pulse: 85 93 72 76  Resp: 18 16 19 19   Temp: 98.3 F (36.8 C) 98.4 F (36.9 C) 97.6 F (36.4 C) 97.7 F (36.5 C)  TempSrc:   Oral   SpO2: 91% 94% 100% 99%  Weight:      Height:        Intake/Output Summary (Last 24 hours) at 11/23/2022 1719 Last  data filed at 11/23/2022 1517 Gross per 24 hour  Intake 240 ml  Output 3000 ml  Net -2760 ml   Filed Weights   11/18/22 2051  Weight: 60.1  kg    Examination:   Constitutional: NAD HEENT: conjunctivae and lids normal, EOMI CV: No cyanosis.   RESP: normal respiratory effort, on RA Neuro: II - XII grossly intact.     Data Reviewed: I have personally reviewed labs and imaging studies  Time spent: 35 minutes  Darlin Priestly, MD [facility] If 7PM-7AM, please contact night-coverage 11/23/2022, 5:19 PM

## 2022-11-23 NOTE — Progress Notes (Signed)
Physical Therapy Treatment Patient Details Name: Peter Robertson MRN: [id number] DOB: June 09, 1941 Today's Date: 11/23/2022   History of Present Illness Pt is a 82 y.o. male who presents with a sacral wound for potential infection. Currently being treated for infection with antibiotics. PMH significant for HFpEF, BPH, DM, dementia, CKD 3A, history of hydronephrosis requiring nephrostomy tubes, HTN, CAD s/p CABG and paroxysmal A-fib on Eliquis, IVH s/p fall.    PT Comments    Pt alert, noted for L lean in recliner, minA to correct but able to maintain midline once assisted into position. Sit <> Stand with RW and minAx2, noted for strong posterior lean. He was able to minimally correct with weight shift and verbal cueing for foot positioning but with step pivot posterior lean present and strong throughout transfer. Once sitting EOB pt actively attempting to return to supine, minAx2 to improve safety. He was able to participate in a few supine BLE exercises, verbal cues. The patient would benefit from further skilled PT intervention to continue to progress towards goals.     Recommendations for follow up therapy are one component of a multi-disciplinary discharge planning process, led by the attending physician.  Recommendations may be updated based on patient status, additional functional criteria and insurance authorization.  Follow Up Recommendations  Can patient physically be transported by private vehicle: No    Assistance Recommended at Discharge Frequent or constant Supervision/Assistance  Patient can return home with the following Two people to help with walking and/or transfers;Two people to help with bathing/dressing/bathroom;Assistance with cooking/housework;Help with stairs or ramp for entrance;Assist for transportation;Direct supervision/assist for medications management   Equipment Recommendations  Other (comment) (to be decided at next venue of care)    Recommendations for Other  Services       Precautions / Restrictions Precautions Precautions: Fall Restrictions Weight Bearing Restrictions: No     Mobility  Bed Mobility Overal bed mobility: Needs Assistance Bed Mobility: Sit to Supine       Sit to supine: Min assist, +2 for safety/equipment   General bed mobility comments: pt attempting to return to supine early, minAx2 to complete safely    Transfers Overall transfer level: Needs assistance Equipment used: Rolling walker (2 wheels) Transfers: Sit to/from Stand, Bed to chair/wheelchair/BSC Sit to Stand: Mod assist, +2 physical assistance   Step pivot transfers: Mod assist, +2 physical assistance            Ambulation/Gait               General Gait Details: unable to truly ambulate today   Stairs             Wheelchair Mobility    Modified Rankin (Stroke Patients Only)       Balance Overall balance assessment: Needs assistance Sitting-balance support: Feet supported Sitting balance-Leahy Scale: Fair Sitting balance - Comments: L lateral lean, minA to correct Postural control: Posterior lean Standing balance support: Bilateral upper extremity supported, Reliant on assistive device for balance Standing balance-Leahy Scale: Poor                              Cognition Arousal/Alertness: Awake/alert Behavior During Therapy: WFL for tasks assessed/performed Overall Cognitive Status: No family/caregiver present to determine baseline cognitive functioning                                 General Comments:  pt oriented to self, follow commands        Exercises Other Exercises Other Exercises: ankle pumps, heel slides, SLR x10 BLE    General Comments General comments (skin integrity, edema, etc.): sacral wound noted not to be covered while OT assisted with pericare. RN notified.      Pertinent Vitals/Pain Pain Assessment Pain Assessment: Faces Faces Pain Scale: Hurts a little bit Pain  Location: buttocks Pain Descriptors / Indicators: Guarding, Grimacing Pain Intervention(s): Limited activity within patient's tolerance, Monitored during session, Premedicated before session, Repositioned    Home Living                          Prior Function            PT Goals (current goals can now be found in the care plan section) Progress towards PT goals: Progressing toward goals    Frequency    Min 2X/week      PT Plan Current plan remains appropriate    Co-evaluation              AM-PAC PT "6 Clicks" Mobility   Outcome Measure  Help needed turning from your back to your side while in a flat bed without using bedrails?: A Little Help needed moving from lying on your back to sitting on the side of a flat bed without using bedrails?: A Little Help needed moving to and from a bed to a chair (including a wheelchair)?: A Lot Help needed standing up from a chair using your arms (e.g., wheelchair or bedside chair)?: A Lot Help needed to walk in hospital room?: Total Help needed climbing 3-5 steps with a railing? : Total 6 Click Score: 12    End of Session Equipment Utilized During Treatment: Gait belt Activity Tolerance: Patient tolerated treatment well Patient left: in bed;with bed alarm set;with call bell/phone within reach Nurse Communication: Mobility status PT Visit Diagnosis: Muscle weakness (generalized) (M62.81);History of falling (Z91.81);Unsteadiness on feet (R26.81)     Time: 1610-9604 PT Time Calculation (min) (ACUTE ONLY): 13 min  Charges:  $Therapeutic Exercise: 8-22 mins                     Olga Coaster PT, DPT 2:59 PM,11/23/22

## 2022-11-23 NOTE — Progress Notes (Signed)
RE: Peter Robertson Date of Birth: [birth year] Date: 11/23/2022     To Whom It May Concern:   Please be advised that the above-named patient will require a short-term nursing home stay - anticipated 30 days or less for rehabilitation and strengthening.  The plan is for return home

## 2022-11-23 NOTE — Plan of Care (Signed)
  Problem: Skin Integrity: Goal: Skin integrity will improve Outcome: Progressing   Problem: Skin Integrity: Goal: Risk for impaired skin integrity will decrease Outcome: Progressing   Problem: Pain Managment: Goal: General experience of comfort will improve Outcome: Progressing   Problem: Safety: Goal: Ability to remain free from injury will improve Outcome: Progressing   Problem: Skin Integrity: Goal: Risk for impaired skin integrity will decrease Outcome: Progressing   Problem: Skin Integrity: Goal: Skin integrity will improve Outcome: Progressing   Problem: Skin Integrity: Goal: Risk for impaired skin integrity will decrease Outcome: Progressing   Problem: Pain Managment: Goal: General experience of comfort will improve Outcome: Progressing   Problem: Safety: Goal: Ability to remain free from injury will improve Outcome: Progressing   Problem: Skin Integrity: Goal: Risk for impaired skin integrity will decrease Outcome: Progressing

## 2022-11-24 DIAGNOSIS — L089 Local infection of the skin and subcutaneous tissue, unspecified: Secondary | ICD-10-CM | POA: Diagnosis not present

## 2022-11-24 DIAGNOSIS — L8993 Pressure ulcer of unspecified site, stage 3: Secondary | ICD-10-CM

## 2022-11-24 LAB — BASIC METABOLIC PANEL
Anion gap: 7 (ref 5–15)
BUN: 55 mg/dL — ABNORMAL HIGH (ref 8–23)
CO2: 25 mmol/L (ref 22–32)
Calcium: 8.3 mg/dL — ABNORMAL LOW (ref 8.9–10.3)
Chloride: 104 mmol/L (ref 98–111)
Creatinine, Ser: 1.42 mg/dL — ABNORMAL HIGH (ref 0.61–1.24)
GFR, Estimated: 49 mL/min — ABNORMAL LOW (ref >60)
Glucose, Bld: 309 mg/dL — ABNORMAL HIGH (ref 70–99)
Potassium: 4.3 mmol/L (ref 3.5–5.1)
Sodium: 136 mmol/L (ref 135–145)

## 2022-11-24 LAB — CBC
HCT: 25.7 % — ABNORMAL LOW (ref 39.0–52.0)
Hemoglobin: 8 g/dL — ABNORMAL LOW (ref 13.0–17.0)
MCH: 24.6 pg — ABNORMAL LOW (ref 26.0–34.0)
MCHC: 31.1 g/dL (ref 30.0–36.0)
MCV: 79.1 fL — ABNORMAL LOW (ref 80.0–100.0)
Platelets: 135 10*3/uL — ABNORMAL LOW (ref 150–400)
RBC: 3.25 MIL/uL — ABNORMAL LOW (ref 4.22–5.81)
RDW: 15.4 % (ref 11.5–15.5)
WBC: 8.3 10*3/uL (ref 4.0–10.5)
nRBC: 0 % (ref 0.0–0.2)

## 2022-11-24 LAB — GLUCOSE, CAPILLARY
Glucose-Capillary: 194 mg/dL — ABNORMAL HIGH (ref 70–99)
Glucose-Capillary: 218 mg/dL — ABNORMAL HIGH (ref 70–99)
Glucose-Capillary: 223 mg/dL — ABNORMAL HIGH (ref 70–99)
Glucose-Capillary: 175 mg/dL — ABNORMAL HIGH (ref 70–99)
Glucose-Capillary: 201 mg/dL — ABNORMAL HIGH (ref 70–99)

## 2022-11-24 LAB — MAGNESIUM: Magnesium: 1.6 mg/dL — ABNORMAL LOW (ref 1.7–2.4)

## 2022-11-24 MED ORDER — ENOXAPARIN SODIUM 40 MG/0.4ML IJ SOSY
40.0000 mg | PREFILLED_SYRINGE | INTRAMUSCULAR | Status: DC
  Administered 2022-11-24 – 2022-11-26 (×3): 40 mg via SUBCUTANEOUS
  Filled 2022-11-24 (×3): qty 0.4

## 2022-11-24 MED ORDER — INSULIN ASPART 100 UNIT/ML IJ SOLN
3.0000 [IU] | Freq: Three times a day (TID) | INTRAMUSCULAR | Status: DC
  Administered 2022-11-25 – 2022-11-27 (×8): 3 [IU] via SUBCUTANEOUS
  Filled 2022-11-24 (×8): qty 1

## 2022-11-24 MED ORDER — MAGNESIUM SULFATE 2 GM/50ML IV SOLN
2.0000 g | Freq: Once | INTRAVENOUS | Status: AC
  Administered 2022-11-24: 2 g via INTRAVENOUS
  Filled 2022-11-24: qty 50

## 2022-11-24 NOTE — TOC Progression Note (Addendum)
Transition of Care [facility]) - Progression Note    Patient Details  Name: Peter Robertson MRN: [id number] Date of Birth: 09-22-1940  Transition of Care [facility]) CM/SW Contact  Marlowe Sax, RN Phone Number: 11/24/2022, 11:06 AM  Clinical Narrative:    Uploaded clinical documentws to [place] MUST for PASSR, PASSR pending Per [employer] patient was in [facility] in 2022, and is open with [facility] for [facility]  Expected Discharge Plan: Skilled Nursing Facility Barriers to Discharge: SNF Pending bed offer, Insurance Authorization  Expected Discharge Plan and Services       Living arrangements for the past 2 months: Single Family Home                                       Social Determinants of Health (SDOH) Interventions SDOH Screenings   Tobacco Use: Medium Risk (05/13/2021)    Readmission Risk Interventions     No data to display

## 2022-11-24 NOTE — Progress Notes (Signed)
Visited with Peter Robertson at his bedside. Not interactive and unable to participate in goals of care conversations. No family at bedside during time of visit. Attempted call to [name], HIPAA compliant VM left, awaiting return call.  No Charge.  Leeanne Deed, DNP, AGNP-C Palliative Medicine  Please call Palliative Medicine team phone with any questions [phone]. For individual providers please see AMION.

## 2022-11-24 NOTE — TOC Progression Note (Signed)
Transition of Care [facility]) - Progression Note    Patient Details  Name: Peter Robertson MRN: [id number] Date of Birth: 1940/09/01  Transition of Care [facility]) CM/SW Contact  Marlowe Sax, RN Phone Number: 11/24/2022, 4:31 PM  Clinical Narrative:     PASSR obtained [id number], Ins pending ref number [id number]  Expected Discharge Plan: Skilled Nursing Facility Barriers to Discharge: SNF Pending bed offer, Insurance Authorization  Expected Discharge Plan and Services       Living arrangements for the past 2 months: Single Family Home                                       Social Determinants of Health (SDOH) Interventions SDOH Screenings   Tobacco Use: Medium Risk (05/13/2021)    Readmission Risk Interventions     No data to display

## 2022-11-24 NOTE — Inpatient Diabetes Management (Signed)
Inpatient Diabetes Program Recommendations  AACE/ADA: New Consensus Statement on Inpatient Glycemic Control (2015)  Target Ranges:  Prepandial:   less than 140 mg/dL      Peak postprandial:   less than 180 mg/dL (1-2 hours)      Critically ill patients:  140 - 180 mg/dL   Lab Results  Component Value Date   GLUCAP 194 (H) 11/24/2022   HGBA1C 6.8 (H) 11/19/2022    Review of Glycemic Control  Latest Reference Range & Units 11/23/22 07:45 11/23/22 11:48 11/23/22 16:52 11/23/22 21:27 11/24/22 07:42  Glucose-Capillary 70 - 99 mg/dL 696 (H) 295 (H) 284 (H) 182 (H) 194 (H)  (H): Data is abnormally high  Diabetes history: DM2 Outpatient Diabetes medications: Lantus 30 units QD, Jardiance 25 mg QD Current orders for Inpatient glycemic control: Semglee 30 units QD, Novolog 0-15 units TID and 0-5 units QHS  Inpatient Diabetes Program Recommendations:    Appears postprandials are elevated.  Please consider:  Novolog 2 units TID with meals  Will continue to follow while inpatient.  Thank you, Dulce Sellar, MSN, CDCES Diabetes Coordinator Inpatient Diabetes Program [phone] (team pager from 8a-5p)

## 2022-11-24 NOTE — TOC Progression Note (Signed)
Transition of Care [facility]) - Progression Note    Patient Details  Name: Peter Robertson MRN: [id number] Date of Birth: [birth year]  Transition of Care [facility]) CM/SW Contact  Marlowe Sax, RN Phone Number: 11/24/2022, 4:22 PM  Clinical Narrative:    Met with the patient in the room and reviewed the bed offers with him, he chose [facility], I notified [facility], Ins pending   Expected Discharge Plan: Skilled Nursing Facility Barriers to Discharge: SNF Pending bed offer, Insurance Authorization  Expected Discharge Plan and Services       Living arrangements for the past 2 months: Single Family Home                                       Social Determinants of Health (SDOH) Interventions SDOH Screenings   Tobacco Use: Medium Risk (05/13/2021)    Readmission Risk Interventions     No data to display
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
## 2022-11-24 NOTE — Plan of Care (Signed)
  Problem: Clinical Measurements: Goal: Ability to avoid or minimize complications of infection will improve Outcome: Progressing   Problem: Skin Integrity: Goal: Skin integrity will improve Outcome: Progressing   Problem: Skin Integrity: Goal: Risk for impaired skin integrity will decrease Outcome: Progressing   Problem: Safety: Goal: Ability to remain free from injury will improve Outcome: Progressing   Problem: Skin Integrity: Goal: Risk for impaired skin integrity will decrease Outcome: Progressing

## 2022-11-25 DIAGNOSIS — R296 Repeated falls: Secondary | ICD-10-CM

## 2022-11-25 DIAGNOSIS — Z7189 Other specified counseling: Secondary | ICD-10-CM

## 2022-11-25 DIAGNOSIS — L8993 Pressure ulcer of unspecified site, stage 3: Secondary | ICD-10-CM | POA: Diagnosis not present

## 2022-11-25 DIAGNOSIS — Z8679 Personal history of other diseases of the circulatory system: Secondary | ICD-10-CM

## 2022-11-25 DIAGNOSIS — E43 Unspecified severe protein-calorie malnutrition: Secondary | ICD-10-CM

## 2022-11-25 DIAGNOSIS — I5032 Chronic diastolic (congestive) heart failure: Secondary | ICD-10-CM | POA: Diagnosis not present

## 2022-11-25 DIAGNOSIS — I2510 Atherosclerotic heart disease of native coronary artery without angina pectoris: Secondary | ICD-10-CM

## 2022-11-25 DIAGNOSIS — F0390 Unspecified dementia without behavioral disturbance: Secondary | ICD-10-CM | POA: Diagnosis not present

## 2022-11-25 LAB — GLUCOSE, CAPILLARY
Glucose-Capillary: 163 mg/dL — ABNORMAL HIGH (ref 70–99)
Glucose-Capillary: 214 mg/dL — ABNORMAL HIGH (ref 70–99)
Glucose-Capillary: 166 mg/dL — ABNORMAL HIGH (ref 70–99)
Glucose-Capillary: 201 mg/dL — ABNORMAL HIGH (ref 70–99)

## 2022-11-25 NOTE — TOC Progression Note (Signed)
Transition of Care [facility]) - Progression Note    Patient Details  Name: Peter Robertson MRN: [id number] Date of Birth: Jan 22, 1941  Transition [facility]) CM/SW Contact  Darolyn Rua, [place] Phone Number: 11/25/2022, 1:12 PM  Clinical Narrative:     Insurance auth for [employer] remains pending at this time.   Expected Discharge Plan: Skilled Nursing Facility Barriers to Discharge: SNF Pending bed offer, Insurance Authorization  Expected Discharge Plan and Services       Living arrangements for the past 2 months: Single Family Home                                       Social Determinants of Health (SDOH) Interventions SDOH Screenings   Tobacco Use: Medium Risk (05/13/2021)    Readmission Risk Interventions     No data to display
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
## 2022-11-25 NOTE — Progress Notes (Signed)
   11/25/22 1700  Spiritual Encounters  Type of Visit Initial  Care provided to: Pt and family  Referral source Nurse (RN/NT/LPN)  Reason for visit Advance directives  OnCall Visit Yes   Chaplain responded to [facility] consult to facilitate completion of AD. Paperwork completed but not signed. Notary unavailable at this time.
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
## 2022-11-26 DIAGNOSIS — I5032 Chronic diastolic (congestive) heart failure: Secondary | ICD-10-CM | POA: Diagnosis not present

## 2022-11-26 DIAGNOSIS — I2510 Atherosclerotic heart disease of native coronary artery without angina pectoris: Secondary | ICD-10-CM | POA: Diagnosis not present

## 2022-11-26 DIAGNOSIS — L8993 Pressure ulcer of unspecified site, stage 3: Secondary | ICD-10-CM | POA: Diagnosis not present

## 2022-11-26 DIAGNOSIS — R296 Repeated falls: Secondary | ICD-10-CM | POA: Diagnosis not present

## 2022-11-26 LAB — GLUCOSE, CAPILLARY
Glucose-Capillary: 106 mg/dL — ABNORMAL HIGH (ref 70–99)
Glucose-Capillary: 162 mg/dL — ABNORMAL HIGH (ref 70–99)
Glucose-Capillary: 179 mg/dL — ABNORMAL HIGH (ref 70–99)
Glucose-Capillary: 120 mg/dL — ABNORMAL HIGH (ref 70–99)
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
## 2022-11-27 DIAGNOSIS — E119 Type 2 diabetes mellitus without complications: Secondary | ICD-10-CM

## 2022-11-27 DIAGNOSIS — L03317 Cellulitis of buttock: Secondary | ICD-10-CM | POA: Diagnosis not present

## 2022-11-27 DIAGNOSIS — L89153 Pressure ulcer of sacral region, stage 3: Secondary | ICD-10-CM | POA: Diagnosis not present

## 2022-11-27 DIAGNOSIS — N1831 Chronic kidney disease, stage 3a: Secondary | ICD-10-CM

## 2022-11-27 DIAGNOSIS — L8993 Pressure ulcer of unspecified site, stage 3: Secondary | ICD-10-CM | POA: Diagnosis not present

## 2022-11-27 LAB — BASIC METABOLIC PANEL
Anion gap: 7 (ref 5–15)
BUN: 58 mg/dL — ABNORMAL HIGH (ref 8–23)
CO2: 28 mmol/L (ref 22–32)
Calcium: 8.4 mg/dL — ABNORMAL LOW (ref 8.9–10.3)
Chloride: 101 mmol/L (ref 98–111)
Creatinine, Ser: 1.35 mg/dL — ABNORMAL HIGH (ref 0.61–1.24)
GFR, Estimated: 52 mL/min — ABNORMAL LOW (ref >60)
Glucose, Bld: 111 mg/dL — ABNORMAL HIGH (ref 70–99)
Potassium: 4.7 mmol/L (ref 3.5–5.1)
Sodium: 136 mmol/L (ref 135–145)

## 2022-11-27 LAB — CBC
HCT: 24.8 % — ABNORMAL LOW (ref 39.0–52.0)
Hemoglobin: 7.6 g/dL — ABNORMAL LOW (ref 13.0–17.0)
MCH: 24.3 pg — ABNORMAL LOW (ref 26.0–34.0)
MCHC: 30.6 g/dL (ref 30.0–36.0)
MCV: 79.2 fL — ABNORMAL LOW (ref 80.0–100.0)
Platelets: 131 10*3/uL — ABNORMAL LOW (ref 150–400)
RBC: 3.13 MIL/uL — ABNORMAL LOW (ref 4.22–5.81)
RDW: 15.6 % — ABNORMAL HIGH (ref 11.5–15.5)
WBC: 5.6 10*3/uL (ref 4.0–10.5)
nRBC: 0 % (ref 0.0–0.2)

## 2022-11-27 LAB — IRON AND TIBC
Iron: 25 ug/dL — ABNORMAL LOW (ref 45–182)
Saturation Ratios: 13 % — ABNORMAL LOW (ref 17.9–39.5)
TIBC: 193 ug/dL — ABNORMAL LOW (ref 250–450)
UIBC: 168 ug/dL

## 2022-11-27 LAB — RETICULOCYTES
Immature Retic Fract: 29.3 % — ABNORMAL HIGH (ref 2.3–15.9)
RBC.: 3.18 MIL/uL — ABNORMAL LOW (ref 4.22–5.81)
Retic Count, Absolute: 59.5 10*3/uL (ref 19.0–186.0)
Retic Ct Pct: 1.9 % (ref 0.4–3.1)

## 2022-11-27 LAB — FOLATE: Folate: 17.1 ng/mL (ref >5.9)

## 2022-11-27 LAB — VITAMIN B12: Vitamin B-12: 1792 pg/mL — ABNORMAL HIGH (ref 180–914)

## 2022-11-27 LAB — MAGNESIUM: Magnesium: 1.9 mg/dL (ref 1.7–2.4)

## 2022-11-27 LAB — GLUCOSE, CAPILLARY
Glucose-Capillary: 131 mg/dL — ABNORMAL HIGH (ref 70–99)
Glucose-Capillary: 219 mg/dL — ABNORMAL HIGH (ref 70–99)

## 2022-11-27 LAB — FERRITIN: Ferritin: 109 ng/mL (ref 24–336)

## 2022-11-27 MED ORDER — MEDIHONEY WOUND/BURN DRESSING EX PSTE: 1.0000 | PASTE | Freq: Every day | CUTANEOUS | Status: DC

## 2022-11-27 MED ORDER — ONDANSETRON HCL 4 MG PO TABS: 4.0000 mg | ORAL_TABLET | Freq: Four times a day (QID) | ORAL | 0 refills | Status: DC | PRN

## 2022-11-27 MED ORDER — FE FUM-VIT C-VIT B12-FA 460-60-0.01-1 MG PO CAPS
1.0000 | ORAL_CAPSULE | Freq: Every day | ORAL | Status: DC
  Filled 2022-11-27: qty 1

## 2022-11-27 MED ORDER — DOCUSATE SODIUM 100 MG PO CAPS: 100.0000 mg | ORAL_CAPSULE | Freq: Two times a day (BID) | ORAL | 0 refills | Status: DC | PRN

## 2022-11-27 MED ORDER — GABAPENTIN 300 MG PO CAPS: 300.0000 mg | ORAL_CAPSULE | Freq: Three times a day (TID) | ORAL | Status: DC

## 2022-11-27 MED ORDER — GERHARDT'S BUTT CREAM: TOPICAL_CREAM | Freq: Three times a day (TID) | CUTANEOUS | Status: DC | PRN

## 2022-11-27 MED ORDER — ENSURE ENLIVE PO LIQD: 237.0000 mL | Freq: Three times a day (TID) | ORAL | 12 refills | Status: DC

## 2022-11-27 MED ORDER — OXYCODONE HCL 5 MG PO TABS: 5.0000 mg | ORAL_TABLET | ORAL | 0 refills | Status: DC | PRN

## 2022-11-27 MED ORDER — GERHARDT'S BUTT CREAM: 1.0000 | TOPICAL_CREAM | Freq: Three times a day (TID) | CUTANEOUS | Status: DC | PRN

## 2022-11-27 MED ORDER — FE FUM-VIT C-VIT B12-FA 460-60-0.01-1 MG PO CAPS: 1.0000 | ORAL_CAPSULE | Freq: Every day | ORAL | 0 refills | Status: DC

## 2022-11-27 MED ORDER — ZINC SULFATE 220 (50 ZN) MG PO CAPS: 220.0000 mg | ORAL_CAPSULE | Freq: Every day | ORAL | Status: DC

## 2022-11-27 MED ORDER — AMOXICILLIN-POT CLAVULANATE 875-125 MG PO TABS: 1.0000 | ORAL_TABLET | Freq: Two times a day (BID) | ORAL | 0 refills | Status: AC

## 2022-11-27 MED ORDER — ASCORBIC ACID 500 MG PO TABS: 500.0000 mg | ORAL_TABLET | Freq: Two times a day (BID) | ORAL | Status: DC

## 2022-11-27 MED ORDER — DOXYCYCLINE HYCLATE 100 MG PO TABS: 100.0000 mg | ORAL_TABLET | Freq: Two times a day (BID) | ORAL | 0 refills | Status: AC

## 2022-11-27 MED ORDER — HYDROCORTISONE 1 % EX CREA: TOPICAL_CREAM | Freq: Two times a day (BID) | CUTANEOUS | 0 refills | Status: DC

## 2022-11-27 NOTE — TOC Progression Note (Addendum)
Transition of Care [facility]) - Progression Note    Patient Details  Name: Peter Robertson MRN: [id number] Date of Birth: 20-Aug-1940  Transition of Care [facility]) CM/SW Contact  Marlowe Sax, RN Phone Number: 11/27/2022, 11:03 AM  Clinical Narrative:     Going to room 218 A at [facility],  [name] and received VM Left General VM for a call back  Update, she called back and I let her know he was going to [facility] [employer] [address], she stated that he would likely need Long term care, I explained that he was going for STR but if needed they can work on LTC EMS arranged for transport Expected Discharge Plan: Skilled Nursing Facility Barriers to Discharge: SNF Pending bed offer, Insurance Authorization  Expected Discharge Plan and Services       Living arrangements for the past 2 months: Single Family Home Expected Discharge Date: 11/27/22                                     Social Determinants of Health (SDOH) Interventions SDOH Screenings   Tobacco Use: Medium Risk (05/13/2021)    Readmission Risk Interventions     No data to display

## 2022-11-27 NOTE — Plan of Care (Signed)

## 2022-11-27 NOTE — TOC Progression Note (Signed)
Transition of Care [facility]) - Progression Note    Patient Details  Name: Peter Robertson MRN: [id number] Date of Birth: 02-12-1941  Transition of Care [facility]) CM/SW Contact  Marlowe Sax, RN Phone Number: 11/27/2022, 10:26 AM  Clinical Narrative:    Ins approved 6/9 - 6/12, review due 6/12.  Reference ID: [id number]  Expected Discharge Plan: Skilled Nursing Facility Barriers to Discharge: SNF Pending bed offer, Insurance Authorization  Expected Discharge Plan and Services       Living arrangements for the past 2 months: Single Family Home                                       Social Determinants of Health (SDOH) Interventions SDOH Screenings   Tobacco Use: Medium Risk (05/13/2021)    Readmission Risk Interventions     No data to display
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
## 2022-11-27 NOTE — TOC Progression Note (Signed)
Transition of Care [facility]) - Progression Note    Patient Details  Name: Peter Robertson MRN: [id number] Date of Birth: [birth year]  [facility]) CM/SW Contact  Marlowe Sax, RN Phone Number: 11/27/2022, 9:46 AM  Clinical Narrative:     Insurance pending to go to [facility]  Expected Discharge Plan: Skilled Nursing Facility Barriers to Discharge: SNF Pending bed offer, Insurance Authorization  Expected Discharge Plan and Services       Living arrangements for the past 2 months: Single Family Home                                       Social Determinants of Health (SDOH) Interventions SDOH Screenings   Tobacco Use: Medium Risk (05/13/2021)    Readmission Risk Interventions     No data to display

## 2022-11-27 NOTE — Plan of Care (Signed)
Problem: Clinical Measurements: Goal: Ability to avoid or minimize complications of infection will improve 11/27/2022 1229 by Dillard Essex, RN Outcome: Completed/Met 11/27/2022 1229 by Dillard Essex, RN Outcome: Progressing   Problem: Skin Integrity: Goal: Skin integrity will improve 11/27/2022 1229 by Dillard Essex, RN Outcome: Completed/Met 11/27/2022 1229 by Dillard Essex, RN Outcome: Progressing   Problem: Education: Goal: Ability to describe self-care measures that may prevent or decrease complications (Diabetes Survival Skills Education) will improve 11/27/2022 1229 by Dillard Essex, RN Outcome: Completed/Met 11/27/2022 1229 by Dillard Essex, RN Outcome: Progressing Goal: Individualized Educational Video(s) 11/27/2022 1229 by Dillard Essex, RN Outcome: Completed/Met 11/27/2022 1229 by Dillard Essex, RN Outcome: Progressing   Problem: Coping: Goal: Ability to adjust to condition or change in health will improve 11/27/2022 1229 by Dillard Essex, RN Outcome: Completed/Met 11/27/2022 1229 by Dillard Essex, RN Outcome: Progressing   Problem: Fluid Volume: Goal: Ability to maintain a balanced intake and output will improve 11/27/2022 1229 by Dillard Essex, RN Outcome: Completed/Met 11/27/2022 1229 by Dillard Essex, RN Outcome: Progressing   Problem: Health Behavior/Discharge Planning: Goal: Ability to identify and utilize available resources and services will improve 11/27/2022 1229 by Dillard Essex, RN Outcome: Completed/Met 11/27/2022 1229 by Dillard Essex, RN Outcome: Progressing Goal: Ability to manage health-related needs will improve 11/27/2022 1229 by Dillard Essex, RN Outcome: Completed/Met 11/27/2022 1229 by Dillard Essex, RN Outcome: Progressing   Problem: Metabolic: Goal: Ability to maintain appropriate glucose levels will improve 11/27/2022 1229 by Dillard Essex, RN Outcome: Completed/Met 11/27/2022 1229 by  Dillard Essex, RN Outcome: Progressing   Problem: Nutritional: Goal: Maintenance of adequate nutrition will improve 11/27/2022 1229 by Dillard Essex, RN Outcome: Completed/Met 11/27/2022 1229 by Dillard Essex, RN Outcome: Progressing Goal: Progress toward achieving an optimal weight will improve 11/27/2022 1229 by Dillard Essex, RN Outcome: Completed/Met 11/27/2022 1229 by Dillard Essex, RN Outcome: Progressing   Problem: Skin Integrity: Goal: Risk for impaired skin integrity will decrease 11/27/2022 1229 by Dillard Essex, RN Outcome: Completed/Met 11/27/2022 1229 by Dillard Essex, RN Outcome: Progressing   Problem: Tissue Perfusion: Goal: Adequacy of tissue perfusion will improve 11/27/2022 1229 by Dillard Essex, RN Outcome: Completed/Met 11/27/2022 1229 by Dillard Essex, RN Outcome: Progressing   Problem: Education: Goal: Knowledge of General Education information will improve Description: Including pain rating scale, medication(s)/side effects and non-pharmacologic comfort measures 11/27/2022 1229 by Dillard Essex, RN Outcome: Completed/Met 11/27/2022 1229 by Dillard Essex, RN Outcome: Progressing   Problem: Health Behavior/Discharge Planning: Goal: Ability to manage health-related needs will improve 11/27/2022 1229 by Dillard Essex, RN Outcome: Completed/Met 11/27/2022 1229 by Dillard Essex, RN Outcome: Progressing   Problem: Clinical Measurements: Goal: Ability to maintain clinical measurements within normal limits will improve 11/27/2022 1229 by Dillard Essex, RN Outcome: Completed/Met 11/27/2022 1229 by Dillard Essex, RN Outcome: Progressing Goal: Will remain free from infection 11/27/2022 1229 by Dillard Essex, RN Outcome: Completed/Met 11/27/2022 1229 by Dillard Essex, RN Outcome: Progressing Goal: Diagnostic test results will improve 11/27/2022 1229 by Dillard Essex, RN Outcome: Completed/Met 11/27/2022 1229 by  Dillard Essex, RN Outcome: Progressing Goal: Respiratory complications will improve 11/27/2022 1229 by Dillard Essex, RN Outcome: Completed/Met 11/27/2022 1229 by Dillard Essex, RN Outcome: Progressing Goal: Cardiovascular complication will be avoided 11/27/2022 1229 by Dillard Essex, RN Outcome: Completed/Met 11/27/2022 1229 by Neville Route  N, RN Outcome: Progressing   Problem: Activity: Goal: Risk for activity intolerance will decrease 11/27/2022 1229 by Dillard Essex, RN Outcome: Completed/Met 11/27/2022 1229 by Dillard Essex, RN Outcome: Progressing   Problem: Nutrition: Goal: Adequate nutrition will be maintained 11/27/2022 1229 by Dillard Essex, RN Outcome: Completed/Met 11/27/2022 1229 by Dillard Essex, RN Outcome: Progressing   Problem: Coping: Goal: Level of anxiety will decrease 11/27/2022 1229 by Dillard Essex, RN Outcome: Completed/Met 11/27/2022 1229 by Dillard Essex, RN Outcome: Progressing   Problem: Elimination: Goal: Will not experience complications related to bowel motility 11/27/2022 1229 by Dillard Essex, RN Outcome: Completed/Met 11/27/2022 1229 by Dillard Essex, RN Outcome: Progressing Goal: Will not experience complications related to urinary retention 11/27/2022 1229 by Dillard Essex, RN Outcome: Completed/Met 11/27/2022 1229 by Dillard Essex, RN Outcome: Progressing   Problem: Pain Managment: Goal: General experience of comfort will improve 11/27/2022 1229 by Dillard Essex, RN Outcome: Completed/Met 11/27/2022 1229 by Dillard Essex, RN Outcome: Progressing   Problem: Safety: Goal: Ability to remain free from injury will improve 11/27/2022 1229 by Dillard Essex, RN Outcome: Completed/Met 11/27/2022 1229 by Dillard Essex, RN Outcome: Progressing   Problem: Skin Integrity: Goal: Risk for impaired skin integrity will decrease 11/27/2022 1229 by Dillard Essex, RN Outcome:  Completed/Met 11/27/2022 1229 by Dillard Essex, RN Outcome: Progressing

## 2022-11-27 NOTE — Progress Notes (Signed)
Patient discharged to [facility] with EMS with all belongings. Report called to RN. All questions answered. PIV removed, no bleeding, intact.

## 2022-11-27 NOTE — Care Management Important Message (Signed)
Important Message  Patient Details  Name: Peter Robertson MRN: [id number] Date of Birth: [birth year]   Medicare Important Message Given:  Yes     Peter Robertson A Peter Robertson 11/27/2022, 10:55 AM

## 2023-06-27 ENCOUNTER — Other Ambulatory Visit: Payer: Self-pay

## 2023-06-27 ENCOUNTER — Emergency Department: Payer: Medicare PPO

## 2023-06-27 ENCOUNTER — Inpatient Hospital Stay
Admission: EM | Admit: 2023-06-27 | Discharge: 2023-07-03 | DRG: 690 | Disposition: A | Discharge status: Skilled Nursing Facility | Payer: Medicare PPO | Source: Skilled Nursing Facility | Attending: Internal Medicine | Admitting: Internal Medicine

## 2023-06-27 DIAGNOSIS — E785 Hyperlipidemia, unspecified: Secondary | ICD-10-CM | POA: Diagnosis present

## 2023-06-27 DIAGNOSIS — I5032 Chronic diastolic (congestive) heart failure: Secondary | ICD-10-CM | POA: Diagnosis present

## 2023-06-27 DIAGNOSIS — F32A Depression, unspecified: Secondary | ICD-10-CM | POA: Diagnosis present

## 2023-06-27 DIAGNOSIS — Z951 Presence of aortocoronary bypass graft: Secondary | ICD-10-CM | POA: Diagnosis not present

## 2023-06-27 DIAGNOSIS — Z1152 Encounter for screening for COVID-19: Secondary | ICD-10-CM

## 2023-06-27 DIAGNOSIS — N390 Urinary tract infection, site not specified: Secondary | ICD-10-CM | POA: Diagnosis present

## 2023-06-27 DIAGNOSIS — I10 Essential (primary) hypertension: Secondary | ICD-10-CM | POA: Diagnosis present

## 2023-06-27 DIAGNOSIS — F0390 Unspecified dementia without behavioral disturbance: Secondary | ICD-10-CM | POA: Diagnosis present

## 2023-06-27 DIAGNOSIS — Z66 Do not resuscitate: Secondary | ICD-10-CM | POA: Diagnosis present

## 2023-06-27 DIAGNOSIS — W07XXXA Fall from chair, initial encounter: Secondary | ICD-10-CM | POA: Diagnosis present

## 2023-06-27 DIAGNOSIS — D696 Thrombocytopenia, unspecified: Secondary | ICD-10-CM | POA: Diagnosis present

## 2023-06-27 DIAGNOSIS — Z882 Allergy status to sulfonamides status: Secondary | ICD-10-CM

## 2023-06-27 DIAGNOSIS — D869 Sarcoidosis, unspecified: Secondary | ICD-10-CM | POA: Diagnosis present

## 2023-06-27 DIAGNOSIS — D860 Sarcoidosis of lung: Secondary | ICD-10-CM | POA: Diagnosis present

## 2023-06-27 DIAGNOSIS — F03C3 Unspecified dementia, severe, with mood disturbance: Secondary | ICD-10-CM | POA: Diagnosis present

## 2023-06-27 DIAGNOSIS — N1831 Chronic kidney disease, stage 3a: Secondary | ICD-10-CM | POA: Diagnosis present

## 2023-06-27 DIAGNOSIS — R4189 Other symptoms and signs involving cognitive functions and awareness: Secondary | ICD-10-CM | POA: Diagnosis present

## 2023-06-27 DIAGNOSIS — N179 Acute kidney failure, unspecified: Secondary | ICD-10-CM | POA: Diagnosis present

## 2023-06-27 DIAGNOSIS — E875 Hyperkalemia: Secondary | ICD-10-CM | POA: Diagnosis present

## 2023-06-27 DIAGNOSIS — Z515 Encounter for palliative care: Secondary | ICD-10-CM | POA: Diagnosis not present

## 2023-06-27 DIAGNOSIS — Z8249 Family history of ischemic heart disease and other diseases of the circulatory system: Secondary | ICD-10-CM | POA: Diagnosis not present

## 2023-06-27 DIAGNOSIS — E1129 Type 2 diabetes mellitus with other diabetic kidney complication: Secondary | ICD-10-CM | POA: Diagnosis present

## 2023-06-27 DIAGNOSIS — I2510 Atherosclerotic heart disease of native coronary artery without angina pectoris: Secondary | ICD-10-CM | POA: Diagnosis present

## 2023-06-27 DIAGNOSIS — Z79899 Other long term (current) drug therapy: Secondary | ICD-10-CM

## 2023-06-27 DIAGNOSIS — I480 Paroxysmal atrial fibrillation: Secondary | ICD-10-CM | POA: Diagnosis present

## 2023-06-27 DIAGNOSIS — I130 Hypertensive heart and chronic kidney disease with heart failure and stage 1 through stage 4 chronic kidney disease, or unspecified chronic kidney disease: Secondary | ICD-10-CM | POA: Diagnosis present

## 2023-06-27 DIAGNOSIS — N136 Pyonephrosis: Secondary | ICD-10-CM | POA: Diagnosis present

## 2023-06-27 DIAGNOSIS — Z87891 Personal history of nicotine dependence: Secondary | ICD-10-CM | POA: Diagnosis not present

## 2023-06-27 DIAGNOSIS — Z91048 Other nonmedicinal substance allergy status: Secondary | ICD-10-CM

## 2023-06-27 DIAGNOSIS — Y92009 Unspecified place in unspecified non-institutional (private) residence as the place of occurrence of the external cause: Secondary | ICD-10-CM

## 2023-06-27 DIAGNOSIS — N3000 Acute cystitis without hematuria: Secondary | ICD-10-CM | POA: Diagnosis not present

## 2023-06-27 DIAGNOSIS — E1122 Type 2 diabetes mellitus with diabetic chronic kidney disease: Secondary | ICD-10-CM | POA: Diagnosis present

## 2023-06-27 DIAGNOSIS — I252 Old myocardial infarction: Secondary | ICD-10-CM

## 2023-06-27 DIAGNOSIS — I700 Atherosclerosis of aorta: Secondary | ICD-10-CM | POA: Diagnosis present

## 2023-06-27 DIAGNOSIS — Z87442 Personal history of urinary calculi: Secondary | ICD-10-CM

## 2023-06-27 DIAGNOSIS — Z833 Family history of diabetes mellitus: Secondary | ICD-10-CM

## 2023-06-27 DIAGNOSIS — Z794 Long term (current) use of insulin: Secondary | ICD-10-CM

## 2023-06-27 DIAGNOSIS — R4182 Altered mental status, unspecified: Secondary | ICD-10-CM | POA: Diagnosis not present

## 2023-06-27 DIAGNOSIS — N400 Enlarged prostate without lower urinary tract symptoms: Secondary | ICD-10-CM | POA: Diagnosis present

## 2023-06-27 DIAGNOSIS — W19XXXA Unspecified fall, initial encounter: Secondary | ICD-10-CM | POA: Diagnosis not present

## 2023-06-27 DIAGNOSIS — I4891 Unspecified atrial fibrillation: Secondary | ICD-10-CM | POA: Diagnosis present

## 2023-06-27 DIAGNOSIS — N1832 Chronic kidney disease, stage 3b: Secondary | ICD-10-CM | POA: Diagnosis present

## 2023-06-27 DIAGNOSIS — Z7984 Long term (current) use of oral hypoglycemic drugs: Secondary | ICD-10-CM | POA: Diagnosis not present

## 2023-06-27 LAB — COMPREHENSIVE METABOLIC PANEL
ALT: 15 U/L (ref 0–44)
AST: 12 U/L — ABNORMAL LOW (ref 15–41)
Albumin: 3.6 g/dL (ref 3.5–5.0)
Alkaline Phosphatase: 60 U/L (ref 38–126)
Anion gap: 13 (ref 5–15)
BUN: 95 mg/dL — ABNORMAL HIGH (ref 8–23)
CO2: 18 mmol/L — ABNORMAL LOW (ref 22–32)
Calcium: 8.2 mg/dL — ABNORMAL LOW (ref 8.9–10.3)
Chloride: 102 mmol/L (ref 98–111)
Creatinine, Ser: 3.72 mg/dL — ABNORMAL HIGH (ref 0.61–1.24)
GFR, Estimated: 15 mL/min — ABNORMAL LOW (ref >60)
Glucose, Bld: 207 mg/dL — ABNORMAL HIGH (ref 70–99)
Potassium: 4.8 mmol/L (ref 3.5–5.1)
Sodium: 133 mmol/L — ABNORMAL LOW (ref 135–145)
Total Bilirubin: 0.7 mg/dL (ref 0.0–1.2)
Total Protein: 7 g/dL (ref 6.5–8.1)

## 2023-06-27 LAB — CBC
HCT: 27.2 % — ABNORMAL LOW (ref 39.0–52.0)
Hemoglobin: 8.9 g/dL — ABNORMAL LOW (ref 13.0–17.0)
MCH: 29.2 pg (ref 26.0–34.0)
MCHC: 32.7 g/dL (ref 30.0–36.0)
MCV: 89.2 fL (ref 80.0–100.0)
Platelets: 113 10*3/uL — ABNORMAL LOW (ref 150–400)
RBC: 3.05 MIL/uL — ABNORMAL LOW (ref 4.22–5.81)
RDW: 16 % — ABNORMAL HIGH (ref 11.5–15.5)
WBC: 9 10*3/uL (ref 4.0–10.5)
nRBC: 0 % (ref 0.0–0.2)

## 2023-06-27 LAB — URINALYSIS, ROUTINE W REFLEX MICROSCOPIC
Bilirubin Urine: NEGATIVE
Glucose, UA: 50 mg/dL — AB
Ketones, ur: NEGATIVE mg/dL
Nitrite: NEGATIVE
Protein, ur: 100 mg/dL — AB
RBC / HPF: >50 RBC/hpf (ref 0–5)
Specific Gravity, Urine: 1.014 (ref 1.005–1.030)
Squamous Epithelial / HPF: 0 /[HPF] (ref 0–5)
WBC, UA: >50 WBC/hpf (ref 0–5)
pH: 5 (ref 5.0–8.0)

## 2023-06-27 LAB — CBG MONITORING, ED: Glucose-Capillary: 207 mg/dL — ABNORMAL HIGH (ref 70–99)

## 2023-06-27 LAB — RESP PANEL BY RT-PCR (RSV, FLU A&B, COVID)  RVPGX2
Influenza A by PCR: NEGATIVE
Influenza B by PCR: NEGATIVE
Resp Syncytial Virus by PCR: NEGATIVE
SARS Coronavirus 2 by RT PCR: NEGATIVE

## 2023-06-27 LAB — LACTIC ACID, PLASMA: Lactic Acid, Venous: 0.7 mmol/L (ref 0.5–1.9)

## 2023-06-27 LAB — BRAIN NATRIURETIC PEPTIDE: B Natriuretic Peptide: 124.4 pg/mL — ABNORMAL HIGH (ref 0.0–100.0)

## 2023-06-27 MED ORDER — DM-GUAIFENESIN ER 30-600 MG PO TB12
1.0000 | ORAL_TABLET | Freq: Two times a day (BID) | ORAL | Status: DC | PRN
  Administered 2023-06-30 – 2023-07-02 (×3): 1 via ORAL
  Filled 2023-06-27 (×3): qty 1

## 2023-06-27 MED ORDER — ONDANSETRON HCL 4 MG/2ML IJ SOLN
4.0000 mg | Freq: Three times a day (TID) | INTRAMUSCULAR | Status: DC | PRN
  Administered 2023-07-01: 4 mg via INTRAVENOUS
  Filled 2023-06-27: qty 2

## 2023-06-27 MED ORDER — ALBUTEROL SULFATE (2.5 MG/3ML) 0.083% IN NEBU
2.5000 mg | INHALATION_SOLUTION | RESPIRATORY_TRACT | Status: DC | PRN
  Administered 2023-06-30: 2.5 mg via RESPIRATORY_TRACT
  Filled 2023-06-27: qty 3

## 2023-06-27 MED ORDER — INSULIN ASPART 100 UNIT/ML IJ SOLN
0.0000 [IU] | Freq: Every day | INTRAMUSCULAR | Status: DC
Start: 2023-06-27 — End: 2023-07-03
  Administered 2023-06-27: 2 [IU] via SUBCUTANEOUS
  Administered 2023-06-28: 5 [IU] via SUBCUTANEOUS
  Administered 2023-06-29 – 2023-06-30 (×2): 2 [IU] via SUBCUTANEOUS
  Filled 2023-06-27 (×4): qty 1

## 2023-06-27 MED ORDER — HYDRALAZINE HCL 20 MG/ML IJ SOLN: 5.0000 mg | INTRAMUSCULAR | Status: DC | PRN

## 2023-06-27 MED ORDER — HEPARIN SODIUM (PORCINE) 5000 UNIT/ML IJ SOLN
5000.0000 [IU] | Freq: Three times a day (TID) | INTRAMUSCULAR | Status: DC
  Administered 2023-06-27 – 2023-07-03 (×17): 5000 [IU] via SUBCUTANEOUS
  Filled 2023-06-27 (×17): qty 1

## 2023-06-27 MED ORDER — ACETAMINOPHEN 160 MG/5ML PO SOLN
650.0000 mg | Freq: Four times a day (QID) | ORAL | Status: DC | PRN
  Administered 2023-06-29: 650 mg via ORAL
  Filled 2023-06-27 (×2): qty 20.3

## 2023-06-27 MED ORDER — SODIUM CHLORIDE 0.9 % IV BOLUS
500.0000 mL | Freq: Once | INTRAVENOUS | Status: AC
  Administered 2023-06-27: 500 mL via INTRAVENOUS

## 2023-06-27 MED ORDER — SODIUM CHLORIDE 0.9 % IV SOLN
INTRAVENOUS | Status: AC
Start: 2023-06-27 — End: 2023-06-28

## 2023-06-27 MED ORDER — SODIUM CHLORIDE 0.9 % IV SOLN
2.0000 g | Freq: Once | INTRAVENOUS | Status: AC
  Administered 2023-06-27: 2 g via INTRAVENOUS
  Filled 2023-06-27: qty 20

## 2023-06-27 MED ORDER — INSULIN ASPART 100 UNIT/ML IJ SOLN
0.0000 [IU] | Freq: Three times a day (TID) | INTRAMUSCULAR | Status: DC
  Administered 2023-06-28: 2 [IU] via SUBCUTANEOUS
  Administered 2023-06-28 – 2023-06-29 (×3): 5 [IU] via SUBCUTANEOUS
  Administered 2023-06-29: 3 [IU] via SUBCUTANEOUS
  Administered 2023-06-30: 1 [IU] via SUBCUTANEOUS
  Administered 2023-06-30 – 2023-07-01 (×5): 2 [IU] via SUBCUTANEOUS
  Administered 2023-07-02: 3 [IU] via SUBCUTANEOUS
  Administered 2023-07-02: 2 [IU] via SUBCUTANEOUS
  Administered 2023-07-03: 1 [IU] via SUBCUTANEOUS
  Filled 2023-06-27 (×14): qty 1

## 2023-06-27 MED ORDER — SODIUM CHLORIDE 0.9 % IV SOLN
2.0000 g | INTRAVENOUS | Status: AC
  Administered 2023-06-28 – 2023-07-01 (×4): 2 g via INTRAVENOUS
  Filled 2023-06-27 (×4): qty 20

## 2023-06-27 MED ORDER — IPRATROPIUM-ALBUTEROL 0.5-2.5 (3) MG/3ML IN SOLN
3.0000 mL | Freq: Once | RESPIRATORY_TRACT | Status: AC
  Administered 2023-06-27: 3 mL via RESPIRATORY_TRACT
  Filled 2023-06-27: qty 3

## 2023-06-27 NOTE — Sepsis Progress Note (Signed)
Notified provider of need to order lactic acid. ° °

## 2023-06-27 NOTE — Progress Notes (Signed)
 CODE SEPSIS - PHARMACY COMMUNICATION  **Broad Spectrum Antibiotics should be administered within 1 hour of Sepsis diagnosis**  Time Code Sepsis Called/Page Received: 1950  Antibiotics Ordered: Ceftriaxone   Time of 1st antibiotic administration: 2006  Additional action taken by pharmacy: N/A  If necessary, Name of Provider/Nurse Contacted: N/A    Lum VEAR Mania ,PharmD Clinical Pharmacist  06/27/2023  7:51 PM

## 2023-06-27 NOTE — ED Notes (Signed)
 Pt changed and foam lift dressing placed.

## 2023-06-27 NOTE — ED Notes (Signed)
 Replaced CCM that pt had self removed

## 2023-06-27 NOTE — Sepsis Progress Note (Signed)
 Elink monitoring for the code sepsis protocol.

## 2023-06-27 NOTE — H&P (Signed)
 History and Physical    Peter Robertson FMW:[id number] DOB: 07/15/1940 DOA: 06/27/2023  Referring MD/NP/PA:   PCP: Derenda Rockers, MD   Patient coming from:  The patient is coming from SNF     Chief Complaint: fall  HPI: Peter Robertson is a 83 y.o. male with medical history significant of hypertension, hyperlipidemia, diabetes mellitus, CAD, dCHF, sarcoidosis, dementia, depression, BPH, CKD-3A, A-fib not on anticoagulants, thrombocytopenia, left kidney hydronephrosis (s/p of nephrostomy tube), who presents with fall.   Per report, patient fell off the wheelchair in the facility, no significant injury noted.  Patient denies any pain.  Patient has cough with coarse breathing sound, denies SOB.  He states that his use oxygen as needed normally.  Currently he is on 2 L oxygen with 100% of saturation in ED.  Denies chest pain, fever or chills.  No nausea, vomiting, diarrhea or abdominal pain.  Patient is mildly confused.  Not sure about his baseline mental status.  Data reviewed independently and ED Course: pt was found to have WBC 9.0, positive urinalysis (turbid appearance, moderate amount of leukocyte, many bacteria, WBC > 50), worsening renal function with creatinine 3.72, BUN 95, GFR 15 (recent baseline creatinine 1.35 on 11/27/2022), negative PCR for COVID, flu and RSV.  Temperature 99.1, blood pressure soft 125/70, 85/54 (MAP 66), heart rate 96, RR 19.  Chest x-ray showed sarcoidosis.  CT of head negative.  CT of C-spine is negative for acute injury.  Patient is admitted to telemetry bed as inpatient.  CT per renal stone protocol: 1. Mild left hydronephrosis with proximal hydroureter. No obstructing stone. 2. Marked bladder wall thickening, increased from prior. Differential considerations include obstructive uropathy from enlarged prostate, cystitis, or infiltrative malignancy. 3. Bronchial wall thickening and mucous plugs in the lower lungs. 4. Splenomegaly.   Aortic Atherosclerosis  (ICD10-  Chest x-ray 1. No acute cardiopulmonary process. 2. 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  EKG: I have personally reviewed.  A-fib, QTc 486, right bundle blockade, anteroseptal infarction pattern   Review of Systems:   General: no fevers, chills, no body weight gain, has fatigue HEENT: no blurry vision, hearing changes or sore throat Respiratory: no dyspnea, has coughing CV: no chest pain, no palpitations GI: no nausea, vomiting, abdominal pain, diarrhea, constipation GU: no dysuria, burning on urination, increased urinary frequency, hematuria  Ext: no leg edema Neuro: no unilateral weakness, numbness, or tingling, no vision change or hearing loss.  Has mild confusion and  fall. Skin: no rash, no skin tear. MSK: No muscle spasm, no deformity, no limitation of range of movement in spin Heme: No easy bruising.  Travel history: No recent long distant travel.   Allergy:  Allergies  Allergen Reactions   Sulfamethoxazole-Trimethoprim Other (See Comments)    dizziness dizziness dizziness    Tape Rash    blisters    Past Medical History:  Diagnosis Date   Asthma    BPH (benign prostatic hyperplasia)    Chronic kidney disease    Kidney Stones   Coronary artery disease    Diabetes mellitus without complication (HCC)    Heart murmur    Hyperlipidemia    Hypertension    Myocardial infarction [facility])     Past Surgical History:  Procedure Laterality Date   COLONOSCOPY WITH PROPOFOL  N/A 12/03/2014   Procedure: COLONOSCOPY WITH PROPOFOL ;  Surgeon: Lamar ONEIDA Holmes, MD;  Location: [facility] ENDOSCOPY;  Service: Endoscopy;  Laterality: N/A;   CORONARY ARTERY BYPASS GRAFT  LEFT HEART CATH AND CORS/GRAFTS ANGIOGRAPHY N/A 05/22/2018   Procedure: LEFT HEART CATH AND CORS/GRAFTS ANGIOGRAPHY poss PCI;  Surgeon: Perla Evalene PARAS, MD;  Location: [facility] INVASIVE CV LAB;  Service: Cardiovascular;  Laterality: N/A;    Social History:  reports that he  quit smoking about 39 years ago. He has never used smokeless tobacco. He reports that he does not drink alcohol and does not use drugs.  Family History:  Family History  Problem Relation Age of Onset   Diabetes Mother    Cancer Mother    Hypertension Father    Hypertension Brother    Diabetes Brother      Prior to Admission medications   Medication Sig Start Date End Date Taking? Authorizing Provider  acetaminophen  (TYLENOL ) 325 MG tablet Take 2 tablets (650 mg total) by mouth every 6 (six) hours as needed for mild pain or fever. 05/17/21   Lenon Marien CROME, MD  albuterol  (PROVENTIL ) (2.5 MG/3ML) 0.083% nebulizer solution Take 2.5 mg by nebulization every 4 (four) hours as needed.    [provider]  albuterol  (VENTOLIN  HFA) 108 (90 Base) MCG/ACT inhaler Inhale 1-2 puffs into the lungs every 4 (four) hours as needed.    [provider]  alum & mag hydroxide-simeth (MAALOX/MYLANTA) 200-200-20 MG/5ML suspension Take 15 mLs by mouth every 6 (six) hours as needed for indigestion or heartburn. 05/23/21   Amin, Sumayya, MD  ascorbic acid  (VITAMIN C ) 500 MG tablet Take 1 tablet (500 mg total) by mouth 2 (two) times daily. 11/27/22   Amin, Sumayya, MD  atorvastatin  (LIPITOR) 40 MG tablet Take 40 mg by mouth daily before supper.    [provider]  cholecalciferol  (VITAMIN D3) 25 MCG (1000 UT) tablet Take 1,000 Units by mouth daily.    [provider]  docusate sodium  (COLACE) 100 MG capsule Take 1 capsule (100 mg total) by mouth 2 (two) times daily as needed for mild constipation. 11/27/22   Amin, Sumayya, MD  empagliflozin (JARDIANCE) 25 MG TABS tablet Take 1 tablet by mouth daily. 08/10/22   [provider]  Fe Fum-Vit C-Vit B12-FA (TRIGELS-F FORTE) CAPS capsule Take 1 capsule by mouth daily after breakfast. 11/27/22   Caleen Qualia, MD  feeding supplement (ENSURE ENLIVE / ENSURE PLUS) LIQD Take 237 mLs by mouth 3 (three) times daily between meals. 11/27/22    Amin, Sumayya, MD  furosemide  (LASIX ) 20 MG tablet Take 20 mg by mouth daily. 04/05/22   [provider]  gabapentin  (NEURONTIN ) 300 MG capsule Take 1 capsule (300 mg total) by mouth 3 (three) times daily. 11/27/22   Amin, Sumayya, MD  hydrocortisone  cream 1 % Apply topically 2 (two) times daily. To right leg 11/27/22   Caleen Qualia, MD  insulin  glargine (LANTUS ) 100 UNIT/ML injection Inject 0.3 mLs (30 Units total) into the skin daily. 05/17/21   Lenon Marien CROME, MD  isosorbide  mononitrate (IMDUR ) 60 MG 24 hr tablet Take 60 mg by mouth daily.    [provider]  leptospermum manuka honey (MEDIHONEY) PSTE paste Apply 1 Application topically daily. For 1 more week 11/28/22   Amin, Sumayya, MD  metoprolol  succinate (TOPROL -XL) 100 MG 24 hr tablet Take 100 mg by mouth daily.    [provider]  Multiple Vitamins-Minerals (PRESERVISION AREDS 2+MULTI VIT) CAPS Take 1 capsule by mouth 2 (two) times daily.    [provider]  nitroGLYCERIN  (NITROSTAT ) 0.4 MG SL tablet Place 0.4 mg under the tongue every 5 (five) minutes as needed  for chest pain.     [provider]  Nystatin (GERHARDT'S BUTT CREAM) CREA Apply 1 Application topically 3 (three) times daily as needed for irritation. 11/27/22   Amin, Sumayya, MD  omeprazole (PRILOSEC) 40 MG capsule Take 40 mg by mouth 2 (two) times daily.    [provider]  ondansetron  (ZOFRAN ) 4 MG tablet Take 1 tablet (4 mg total) by mouth every 6 (six) hours as needed for nausea. 11/27/22   Caleen Qualia, MD  oxyCODONE  (OXY IR/ROXICODONE ) 5 MG immediate release tablet Take 1 tablet (5 mg total) by mouth every 4 (four) hours as needed. 11/27/22   Amin, Sumayya, MD  sertraline  (ZOLOFT ) 50 MG tablet Take 1 tablet by mouth daily. 08/28/22   [provider]  tamsulosin  (FLOMAX ) 0.4 MG CAPS capsule Take 0.8 mg by mouth at bedtime.     [provider]  valsartan (DIOVAN) 80 MG tablet Take 80 mg by mouth daily.  09/05/22   [provider]  vitamin B-12 (CYANOCOBALAMIN ) 1000 MCG tablet Take 1,000 mcg by mouth daily.    [provider]  zinc  sulfate 220 (50 Zn) MG capsule Take 1 capsule (220 mg total) by mouth daily. 11/28/22   Caleen Qualia, MD    Physical Exam: Vitals:   06/27/23 2100 06/27/23 2200 06/27/23 2300 06/28/23 0030  BP: 118/75 130/71 (!) 113/50 99/69  Pulse: 96 86 78 78  Resp:    (!) 22  Temp:      TempSrc:      SpO2: 100% 99% 100% 92%  Weight:      Height:       General: Not in acute distress HEENT:       Eyes: PERRL, EOMI, no jaundice       ENT: No discharge from the ears and nose, no pharynx injection, no tonsillar enlargement.        Neck: No JVD, no bruit, no mass felt. Heme: No neck lymph node enlargement. Cardiac: S1/S2, RRR, No murmurs, No gallops or rubs. Respiratory: has coarse breathing sound bilaterally GI: Soft, nondistended, nontender, no rebound pain, no organomegaly, BS present. GU: No hematuria Ext: No pitting leg edema bilaterally. 1+DP/PT pulse bilaterally. Musculoskeletal: No joint deformities, No joint redness or warmth, no limitation of ROM in spin. Skin: No rashes.  Neuro: Mildly confused, oriented to place and person not to the time. Cranial nerves II-XII grossly intact, moves all extremities normally. Psych: Patient is not psychotic, no suicidal or hemocidal ideation.  Labs on Admission: I have personally reviewed following labs and imaging studies  CBC: Recent Labs  Lab 06/27/23 1508  WBC 9.0  HGB 8.9*  HCT 27.2*  MCV 89.2  PLT 113*   Basic Metabolic Panel: Recent Labs  Lab 06/27/23 1508  NA 133*  K 4.8  CL 102  CO2 18*  GLUCOSE 207*  BUN 95*  CREATININE 3.72*  CALCIUM  8.2*   GFR: Estimated Creatinine Clearance: 15.4 mL/min (A) (by C-G formula based on SCr of 3.72 mg/dL (H)). Liver Function Tests: Recent Labs  Lab 06/27/23 1508  AST 12*  ALT 15  ALKPHOS 60  BILITOT 0.7  PROT 7.0  ALBUMIN  3.6   No  results for input(s): LIPASE, AMYLASE in the last 168 hours. No results for input(s): AMMONIA in the last 168 hours. Coagulation Profile: No results for input(s): INR, PROTIME in the last 168 hours. Cardiac Enzymes: No results for input(s): CKTOTAL, CKMB, CKMBINDEX, TROPONINI in the last 168 hours. BNP (last 3 results) No  results for input(s): PROBNP in the last 8760 hours. HbA1C: No results for input(s): HGBA1C in the last 72 hours. CBG: Recent Labs  Lab 06/27/23 2306  GLUCAP 207*   Lipid Profile: No results for input(s): CHOL, HDL, LDLCALC, TRIG, CHOLHDL, LDLDIRECT in the last 72 hours. Thyroid Function Tests: No results for input(s): TSH, T4TOTAL, FREET4, T3FREE, THYROIDAB in the last 72 hours. Anemia Panel: No results for input(s): VITAMINB12, FOLATE, FERRITIN, TIBC, IRON, RETICCTPCT in the last 72 hours. Urine analysis:    Component Value Date/Time   COLORURINE YELLOW (A) 06/27/2023 1854   APPEARANCEUR TURBID (A) 06/27/2023 1854   LABSPEC 1.014 06/27/2023 1854   PHURINE 5.0 06/27/2023 1854   GLUCOSEU 50 (A) 06/27/2023 1854   HGBUR MODERATE (A) 06/27/2023 1854   BILIRUBINUR NEGATIVE 06/27/2023 1854   KETONESUR NEGATIVE 06/27/2023 1854   PROTEINUR 100 (A) 06/27/2023 1854   NITRITE NEGATIVE 06/27/2023 1854   LEUKOCYTESUR MODERATE (A) 06/27/2023 1854   Sepsis Labs: @LABRCNTIP (procalcitonin:4,lacticidven:4) ) Recent Results (from the past 240 hours)  Resp panel by RT-PCR (RSV, Flu A&B, Covid) Anterior Nasal Swab     Status: None   Collection Time: 06/27/23  3:09 PM   Specimen: Anterior Nasal Swab  Result Value Ref Range Status   SARS Coronavirus 2 by RT PCR NEGATIVE NEGATIVE Final    Comment: (NOTE) SARS-CoV-2 target nucleic acids are NOT DETECTED.  The SARS-CoV-2 RNA is generally detectable in upper respiratory specimens during the acute phase of infection. The lowest concentration of SARS-CoV-2 viral copies  this assay can detect is 138 copies/mL. A negative result does not preclude SARS-Cov-2 infection and should not be used as the sole basis for treatment or other patient management decisions. A negative result may occur with  improper specimen collection/handling, submission of specimen other than nasopharyngeal swab, presence of viral mutation(s) within the areas targeted by this assay, and inadequate number of viral copies(<138 copies/mL). A negative result must be combined with clinical observations, patient history, and epidemiological information. The expected result is Negative.  Fact Sheet for Patients:  bloggercourse.com  Fact Sheet for Healthcare Providers:  seriousbroker.it  This test is no t yet approved or cleared by the [place]  FDA and  has been authorized for detection and/or diagnosis of SARS-CoV-2 by FDA under an Emergency Use Authorization (EUA). This EUA will remain  in effect (meaning this test can be used) for the duration of the COVID-19 declaration under Section 564(b)(1) of the Act, 21 U.S.C.section 360bbb-3(b)(1), unless the authorization is terminated  or revoked sooner.       Influenza A by PCR NEGATIVE NEGATIVE Final   Influenza B by PCR NEGATIVE NEGATIVE Final    Comment: (NOTE) The Xpert Xpress SARS-CoV-2/FLU/RSV plus assay is intended as an aid in the diagnosis of influenza from Nasopharyngeal swab specimens and should not be used as a sole basis for treatment. Nasal washings and aspirates are unacceptable for Xpert Xpress SARS-CoV-2/FLU/RSV testing.  Fact Sheet for Patients: bloggercourse.com  Fact Sheet for Healthcare Providers: seriousbroker.it  This test is not yet approved or cleared by the [place]  FDA and has been authorized for detection and/or diagnosis of SARS-CoV-2 by FDA under an Emergency Use Authorization (EUA). This EUA  will remain in effect (meaning this test can be used) for the duration of the COVID-19 declaration under Section 564(b)(1) of the Act, 21 U.S.C. section 360bbb-3(b)(1), unless the authorization is terminated or revoked.     Resp Syncytial Virus by PCR NEGATIVE NEGATIVE Final  Comment: (NOTE) Fact Sheet for Patients: bloggercourse.com  Fact Sheet for Healthcare Providers: seriousbroker.it  This test is not yet approved or cleared by the [place]  FDA and has been authorized for detection and/or diagnosis of SARS-CoV-2 by FDA under an Emergency Use Authorization (EUA). This EUA will remain in effect (meaning this test can be used) for the duration of the COVID-19 declaration under Section 564(b)(1) of the Act, 21 U.S.C. section 360bbb-3(b)(1), unless the authorization is terminated or revoked.  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)   Hypertension   Chronic diastolic CHF (congestive heart failure) (HCC)   Acute renal failure superimposed on stage 3a chronic kidney disease (HCC)   Paroxysmal atrial fibrillation (HCC)   Type II diabetes mellitus with renal manifestations (HCC)   Sarcoidosis   Dementia without behavioral disturbance (HCC)   Hyperlipidemia   Thrombocytopenia (HCC)   BPH (benign prostatic hyperplasia)   Fall at home, initial encounter   Depression   Assessment and Plan:   UTI (urinary tract infection) -admit to tele bed for obs -IV rocephin  -f/u Urine culture -Palliative consult  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  BNP 124, CHF is compensated. -Hold Lasix   Acute renal failure superimposed on stage 3a chronic kidney disease (HCC): likely due to UTI.  CT scan showed mild left hydronephrosis with proximal hydroureter,  no obstructing stone, which seems to be a chronic issue. -Follow-up with BMP  Paroxysmal atrial fibrillation (HCC): Heart rate in 90s.  Patient is not taking anticoagulants. -Hold metoprolol  due to soft blood pressure  Type II diabetes mellitus with renal manifestations [facility]): Recent A1c 6.8, well-controlled.  Patient is taking Lantus  30 units daily and Jardiance -Sliding scale insulin  -Lantus  15 units daily  History of sarcoidosis: Patient has coarse cough, no infiltration on chest x-ray. -Bronchodilators -As needed Mucinex  -Start prednisone  50 mg empirically  Dementia without behavioral disturbance (HCC) -Fall precaution  Hyperlipidemia -Lipitor  Thrombocytopenia (HCC): This is chronic issue.  Platelets are 113. -Follow-up with CBC  BPH (benign prostatic hyperplasia) -Flomax   Fall at home, initial encounter -PT/OT  Depression: -Zoloft       DVT ppx: SQ Heparin      Code Status: DNR  Family Communication: No family at bedside  Disposition Plan:  Anticipate discharge back to previous environment, SNF  Consults called:  none  Admission status and Level of care: Telemetry Medical:  as inpt        Dispo: The patient is from: SNF              Anticipated d/c is to: SNF              Anticipated d/c date is: 2 days              Patient currently is not medically stable to d/c.    Severity of Illness:  The appropriate patient status for this patient is INPATIENT. Inpatient status is judged to be reasonable and necessary in order to provide the required intensity of service to ensure the patient's safety. The patient's presenting symptoms, physical exam findings, and initial radiographic and  laboratory data in the context of their chronic comorbidities is felt to place them at high risk for further clinical deterioration. Furthermore, it is not anticipated that the patient will be medically stable for discharge from the hospital within 2 midnights of admission.   * I certify that at the point of admission it is my clinical judgment that the patient will require inpatient hospital care spanning beyond 2 midnights from the point of admission due to high intensity of service, high risk for further deterioration and high frequency of surveillance required.*       Date of Service 06/28/2023    Caleb Exon [facility]   If 7PM-7AM, please contact night-coverage [email or web address] 06/28/2023, 12:55 AM

## 2023-06-27 NOTE — ED Provider Notes (Signed)
 [facility] Provider Note    Event Date/Time   First MD Initiated Contact with Patient 06/27/23 1815     (approximate)   History   Fall   HPI  Peter Robertson is a 83 y.o. male with a history of coronary disease thrombocytopenia, prior kidney injury dementia hypertension CHF stage III CKD paroxysmal A-fib   I obtained additional history from Mrs. Janet who advises she is his healthcare power of attorney and neighbor.  He is residing in a nursing home and she was told that he felt a wheelchair today.  For about 3 to 4 days he has been seemingly a little bit off but fatigued and ill has not been eating or drinking well  She relates a history of previous urologic issues, recent urinary tract infection about 6 to 8 weeks ago treated at the facility, and they were suspicious he might have a urinary tract infection just a few days ago and they sent a culture off but they do not have final results  He does have severe dementia   The patient himself reports no complaints.  He also tells me though that he is at the hospital because he fell climbing out of a tree last week. Which is clearly not the case  Physical Exam   Triage Vital Signs: ED Triage Vitals  Encounter Vitals Group     BP 06/27/23 1508 (!) 155/143     Systolic BP Percentile --      Diastolic BP Percentile --      Pulse Rate 06/27/23 1508 69     Resp 06/27/23 1508 19     Temp 06/27/23 1508 98 F (36.7 C)     Temp Source 06/27/23 1508 Oral     SpO2 06/27/23 1508 94 %     Weight 06/27/23 1505 180 lb (81.6 kg)     Height 06/27/23 1505 5' 7 (1.702 m)     Head Circumference --      Peak Flow --      Pain Score 06/27/23 1505 6     Pain Loc --      Pain Education --      Exclude from Growth Chart --     Most recent vital signs: Vitals:   06/27/23 1842 06/27/23 1930  BP:  (!) 119/90  Pulse: 88 96  Resp:    Temp:    SpO2: 99% 98%     General: Awake, no distress.  He is oriented to self  and being in a hospital but not to date or his own age CV:  Good peripheral perfusion.  Normal tones and rate Resp:  Normal effort.  Effort is normal but he has audible rhonchi.  Frequent congested cough.  He does not have any noted Rales, question if he may have some slight expiratory wheezing Abd:  No distention.  Nontender no obvious distention Other:  Moves all extremities well.  Normocephalic atraumatic.   ED Results / Procedures / Treatments   Labs (all labs ordered are listed, but only abnormal results are displayed) Labs Reviewed  CBC - Abnormal; Notable for the following components:      Result Value   RBC 3.05 (*)    Hemoglobin 8.9 (*)    HCT 27.2 (*)    RDW 16.0 (*)    Platelets 113 (*)    All other components within normal limits  COMPREHENSIVE METABOLIC PANEL - Abnormal; Notable for the following components:   Sodium 133 (*)  CO2 18 (*)    Glucose, Bld 207 (*)    BUN 95 (*)    Creatinine, Ser 3.72 (*)    Calcium  8.2 (*)    AST 12 (*)    GFR, Estimated 15 (*)    All other components within normal limits  BRAIN NATRIURETIC PEPTIDE - Abnormal; Notable for the following components:   B Natriuretic Peptide 124.4 (*)    All other components within normal limits  URINALYSIS, ROUTINE W REFLEX MICROSCOPIC - Abnormal; Notable for the following components:   Color, Urine YELLOW (*)    APPearance TURBID (*)    Glucose, UA 50 (*)    Hgb urine dipstick MODERATE (*)    Protein, ur 100 (*)    Leukocytes,Ua MODERATE (*)    Bacteria, UA MANY (*)    Non Squamous Epithelial PRESENT (*)    All other components within normal limits  RESP PANEL BY RT-PCR (RSV, FLU A&B, COVID)  RVPGX2  URINE CULTURE  CULTURE, BLOOD (ROUTINE X 2)  CULTURE, BLOOD (ROUTINE X 2)     EKG  Interpreted by me at 1830 heart rate 80 QRS 150 QTc 480 Suspect sinus rhythm right bundle branch block.   RADIOLOGY  CT head inter by me as grossly negative for acute intracranial hemorrhage  DG Chest 2  View Result Date: 06/27/2023 CLINICAL DATA:  Wheezing.  Sarcoidosis. EXAM: CHEST - 2 VIEW COMPARISON:  Chest radiograph dated 11/18/2022. 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 No new consolidation. There is no pleural effusion pneumothorax. Stable cardiomediastinal silhouette. Median sternotomy wires. No acute osseous pathology. IMPRESSION: 1. No acute cardiopulmonary process. 2. 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 Electronically Signed   By: Vanetta Chou M.D.   On: 06/27/2023 16:54   CT Head Wo Contrast Result Date: 06/27/2023 CLINICAL DATA:  Provided history: Head trauma, minor.  Fall. EXAM: CT HEAD WITHOUT CONTRAST CT CERVICAL SPINE WITHOUT CONTRAST TECHNIQUE: Multidetector CT imaging of the head and cervical spine was performed following the standard protocol without intravenous contrast. Multiplanar CT image reconstructions of the cervical spine were also generated. RADIATION DOSE REDUCTION: This exam was performed according to the departmental dose-optimization program which includes automated exposure control, adjustment of the mA and/or kV according to patient size and/or use of iterative reconstruction technique. COMPARISON:  Head CT 05/05/2021. Cervical spine CT 05/05/2021. Chest CT 08/27/2019. Thyroid ultrasound 05/16/2021. FINDINGS: CT HEAD FINDINGS Brain: Generalized cerebral atrophy. Commensurate prominence of the ventricles and sulci. 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 This is new from the prior head CT of 05/05/2021. Mild patchy and ill-defined hypoattenuation elsewhere within the cerebral white matter, nonspecific but compatible chronic small vessel ischemic disease. There is no acute intracranial hemorrhage. No demarcated cortical infarct. No extra-axial fluid collection. No evidence of an intracranial mass. No midline shift. Vascular: No hyperdense  vessel.  Atherosclerotic calcifications. Skull: No calvarial fracture or aggressive osseous lesion. Apparent small burr hole within the right frontal calvarium. Sinuses/Orbits: No mass or acute finding within the imaged orbits. Mucosal thickening within the bilateral frontal sinuses (mild right, moderate left). Bilateral ethmoid sinusitis (moderate right, severe left). Mucosal thickening within the bilateral sphenoid sinuses (mild right, moderate left). CT CERVICAL SPINE FINDINGS Alignment: Mild levocurvature of the cervical spine. Nonspecific reversal of the expected cervical lordosis. 2 mm grade 1 anterolisthesis at C2-C3 and C3-C4. 3 mm grade 1 anterolisthesis at C4-C5. Skull base and vertebrae: The basion-dental  and atlanto-dental intervals are maintained.No evidence of acute fracture to the cervical spine. Soft tissues and spinal canal: No prevertebral fluid or swelling. No visible canal hematoma. Disc levels: Cervical spondylosis with multilevel disc space narrowing, disc bulges/central disc protrusions, posterior disc osteophyte complexes, uncovertebral hypertrophy and facet arthrosis. Disc space narrowing is greatest at C5-C6 and C6-C7 (advanced at these levels). Multilevel spinal canal narrowing. Most notably, disc protrusions contribute to apparent moderate spinal canal stenosis at C2-C3 and C3-C4, and a posterior disc osteophyte complex contributes to apparent moderate spinal canal stenosis at C6-C7. Multilevel bony neural foraminal narrowing. Upper chest: No acute finding. Prior median sternotomy. Chronic nodule/foci of scarring (with associated calcifications) again demonstrated within imaged portions of the lung apices. Per prior radiology records, the patient has a history of sarcoidosis. Other: Known 2.2 cm right thyroid lobe nodule. This nodule was previously assessed with thyroid ultrasound on 05/16/2021. Please refer to this prior examination for further description and for recommendations.  IMPRESSION: CT head: 1. No evidence of an acute intracranial abnormality. 2. Parenchymal atrophy and chronic small vessel ischemic disease, as described. 3. Paranasal sinus disease at the imaged levels as outlined. CT cervical spine: 1. No evidence of an acute cervical spine fracture. 2. Nonspecific reversal of the expected cervical lordosis. 3. Mild Levocurvature of the cervical spine. 4. Grade 1 anterolisthesis at C2-C3, C3-C4 and C4-C5. 5. Cervical spondylosis as described. Electronically Signed   By: Rockey Childs D.O.   On: 06/27/2023 16:03   CT Cervical Spine Wo Contrast Result Date: 06/27/2023 CLINICAL DATA:  Provided history: Head trauma, minor.  Fall. EXAM: CT HEAD WITHOUT CONTRAST CT CERVICAL SPINE WITHOUT CONTRAST TECHNIQUE: Multidetector CT imaging of the head and cervical spine was performed following the standard protocol without intravenous contrast. Multiplanar CT image reconstructions of the cervical spine were also generated. RADIATION DOSE REDUCTION: This exam was performed according to the departmental dose-optimization program which includes automated exposure control, adjustment of the mA and/or kV according to patient size and/or use of iterative reconstruction technique. COMPARISON:  Head CT 05/05/2021. Cervical spine CT 05/05/2021. Chest CT 08/27/2019. Thyroid ultrasound 05/16/2021. FINDINGS: CT HEAD FINDINGS Brain: Generalized cerebral atrophy. Commensurate prominence of the ventricles and sulci. 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 This is new from the prior head CT of 05/05/2021. Mild patchy and ill-defined hypoattenuation elsewhere within the cerebral white matter, nonspecific but compatible chronic small vessel ischemic disease. There is no acute intracranial hemorrhage. No demarcated cortical infarct. No extra-axial fluid collection. No evidence of an intracranial mass. No midline shift. Vascular: No hyperdense vessel.   Atherosclerotic calcifications. Skull: No calvarial fracture or aggressive osseous lesion. Apparent small burr hole within the right frontal calvarium. Sinuses/Orbits: No mass or acute finding within the imaged orbits. Mucosal thickening within the bilateral frontal sinuses (mild right, moderate left). Bilateral ethmoid sinusitis (moderate right, severe left). Mucosal thickening within the bilateral sphenoid sinuses (mild right, moderate left). CT CERVICAL SPINE FINDINGS Alignment: Mild levocurvature of the cervical spine. Nonspecific reversal of the expected cervical lordosis. 2 mm grade 1 anterolisthesis at C2-C3 and C3-C4. 3 mm grade 1 anterolisthesis at C4-C5. Skull base and vertebrae: The basion-dental and atlanto-dental intervals are maintained.No evidence of acute fracture to the cervical spine. Soft tissues and spinal canal: No prevertebral fluid or swelling. No visible canal hematoma. Disc levels: Cervical spondylosis with multilevel disc space narrowing, disc bulges/central disc protrusions, posterior disc osteophyte complexes, uncovertebral hypertrophy and facet arthrosis. Disc space narrowing is  greatest at C5-C6 and C6-C7 (advanced at these levels). Multilevel spinal canal narrowing. Most notably, disc protrusions contribute to apparent moderate spinal canal stenosis at C2-C3 and C3-C4, and a posterior disc osteophyte complex contributes to apparent moderate spinal canal stenosis at C6-C7. Multilevel bony neural foraminal narrowing. Upper chest: No acute finding. Prior median sternotomy. Chronic nodule/foci of scarring (with associated calcifications) again demonstrated within imaged portions of the lung apices. Per prior radiology records, the patient has a history of sarcoidosis. Other: Known 2.2 cm right thyroid lobe nodule. This nodule was previously assessed with thyroid ultrasound on 05/16/2021. Please refer to this prior examination for further description and for recommendations. IMPRESSION: CT  head: 1. No evidence of an acute intracranial abnormality. 2. Parenchymal atrophy and chronic small vessel ischemic disease, as described. 3. Paranasal sinus disease at the imaged levels as outlined. CT cervical spine: 1. No evidence of an acute cervical spine fracture. 2. Nonspecific reversal of the expected cervical lordosis. 3. Mild Levocurvature of the cervical spine. 4. Grade 1 anterolisthesis at C2-C3, C3-C4 and C4-C5. 5. Cervical spondylosis as described. Electronically Signed   By: Rockey Childs D.O.   On: 06/27/2023 16:03        PROCEDURES:  Critical Care performed: No  Procedures   MEDICATIONS ORDERED IN ED: Medications  cefTRIAXone  (ROCEPHIN ) 2 g in sodium chloride  0.9 % 100 mL IVPB (has no administration in time range)  ipratropium-albuterol  (DUONEB) 0.5-2.5 (3) MG/3ML nebulizer solution 3 mL (3 mLs Nebulization Given 06/27/23 1930)  sodium chloride  0.9 % bolus 500 mL (500 mLs Intravenous New Bag/Given 06/27/23 1934)     IMPRESSION / MDM / ASSESSMENT AND PLAN / ED COURSE  I reviewed the triage vital signs and the nursing notes.                              Differential diagnosis includes, but is not limited to, possible recent illness infection, dehydration, head injury, pneumonia, CHF asthma COPD, bronchitis etc.  Patient has confusion.  No obvious focal neurologic features.  Symptoms seem to be a bit insidious but preceded by 3 days of feeling fatigued or not eating and drinking well.  Labs show AKI he appears dry clinically but does have a history of CHF so I will provide a cautious fluid bolus.  Also obtain CT to exclude obstructive uropathy.  Await urinalysis.  COVID flu and chest x-ray negative for acute   Patient's presentation is most consistent with acute presentation with potential threat to life or bodily function.     Consulted with patient accepted to hospitalist by Dr. Hilma     FINAL CLINICAL IMPRESSION(S) / ED DIAGNOSES   Final diagnoses:  Acute  cystitis without hematuria  Fall, initial encounter  Altered mental status, unspecified altered mental status type     Rx / DC Orders   ED Discharge Orders     None        Note:  This document was prepared using Dragon voice recognition software and may include unintentional dictation errors.   Dicky Anes, MD 06/27/23 2156

## 2023-06-27 NOTE — ED Triage Notes (Addendum)
 Pt sts that he fell off the bed at the dr's office. Pt sts that he did not hit his head. Pt has no complaints at this time. Pt has audible wheezing at this time. Pt sts that has been going on for a long time and no one checks it out. Pt showed no concern about his breathing.

## 2023-06-27 NOTE — ED Notes (Signed)
 Replaced CCM that pt removed

## 2023-06-27 NOTE — ED Provider Notes (Signed)
 Ms. Janet who presents herself as healthcare power of attorney, she advises that primary goal is comfort.  She would like him to receive food if he is hungry, he is currently wanting a diet Coke which I provided, and she does not want any heroic interventions such as CPR or intubation.  She would like to strongly consider further evaluation with palliative care given his recent illnesses and severity of dementia.  Patient resting comfortably requesting a diet Coke.  Will provide for comfort.  Did discuss risk of upper aspiration, but healthcare power of attorney advises comfort #1 and to attend to his comfort request including food and provision of soda etc.   Dicky Anes, MD 06/27/23 2136

## 2023-06-27 NOTE — Sepsis Progress Note (Signed)
 Secure chat sent to bedside RN to ask if she could assist with getting lactic ordered from MD.  Still no orders at this time.

## 2023-06-28 DIAGNOSIS — N3000 Acute cystitis without hematuria: Secondary | ICD-10-CM | POA: Diagnosis not present

## 2023-06-28 DIAGNOSIS — W19XXXA Unspecified fall, initial encounter: Secondary | ICD-10-CM | POA: Diagnosis not present

## 2023-06-28 DIAGNOSIS — I5032 Chronic diastolic (congestive) heart failure: Secondary | ICD-10-CM | POA: Diagnosis not present

## 2023-06-28 DIAGNOSIS — R4182 Altered mental status, unspecified: Secondary | ICD-10-CM | POA: Diagnosis not present

## 2023-06-28 LAB — CBC
HCT: 23.2 % — ABNORMAL LOW (ref 39.0–52.0)
Hemoglobin: 7.9 g/dL — ABNORMAL LOW (ref 13.0–17.0)
MCH: 29.6 pg (ref 26.0–34.0)
MCHC: 34.1 g/dL (ref 30.0–36.0)
MCV: 86.9 fL (ref 80.0–100.0)
Platelets: 91 10*3/uL — ABNORMAL LOW (ref 150–400)
RBC: 2.67 MIL/uL — ABNORMAL LOW (ref 4.22–5.81)
RDW: 15.9 % — ABNORMAL HIGH (ref 11.5–15.5)
WBC: 5.6 10*3/uL (ref 4.0–10.5)
nRBC: 0 % (ref 0.0–0.2)

## 2023-06-28 LAB — BASIC METABOLIC PANEL
Anion gap: 11 (ref 5–15)
BUN: 95 mg/dL — ABNORMAL HIGH (ref 8–23)
CO2: 19 mmol/L — ABNORMAL LOW (ref 22–32)
Calcium: 7.9 mg/dL — ABNORMAL LOW (ref 8.9–10.3)
Chloride: 106 mmol/L (ref 98–111)
Creatinine, Ser: 3.45 mg/dL — ABNORMAL HIGH (ref 0.61–1.24)
GFR, Estimated: 17 mL/min — ABNORMAL LOW (ref >60)
Glucose, Bld: 147 mg/dL — ABNORMAL HIGH (ref 70–99)
Potassium: 4.4 mmol/L (ref 3.5–5.1)
Sodium: 136 mmol/L (ref 135–145)

## 2023-06-28 LAB — CBG MONITORING, ED
Glucose-Capillary: 88 mg/dL (ref 70–99)
Glucose-Capillary: 159 mg/dL — ABNORMAL HIGH (ref 70–99)
Glucose-Capillary: 263 mg/dL — ABNORMAL HIGH (ref 70–99)
Glucose-Capillary: 366 mg/dL — ABNORMAL HIGH (ref 70–99)

## 2023-06-28 MED ORDER — TAMSULOSIN HCL 0.4 MG PO CAPS
0.8000 mg | ORAL_CAPSULE | Freq: Every day | ORAL | Status: DC
  Filled 2023-06-28: qty 2

## 2023-06-28 MED ORDER — PREDNISONE 20 MG PO TABS
50.0000 mg | ORAL_TABLET | Freq: Every day | ORAL | Status: DC
  Administered 2023-06-28: 50 mg via ORAL
  Filled 2023-06-28: qty 1

## 2023-06-28 MED ORDER — METOPROLOL SUCCINATE ER 50 MG PO TB24
50.0000 mg | ORAL_TABLET | Freq: Every day | ORAL | Status: DC
  Administered 2023-06-29 – 2023-07-03 (×5): 50 mg via ORAL
  Filled 2023-06-28 (×5): qty 1

## 2023-06-28 MED ORDER — SERTRALINE HCL 50 MG PO TABS
50.0000 mg | ORAL_TABLET | Freq: Every day | ORAL | Status: DC
  Administered 2023-06-28 – 2023-07-03 (×6): 50 mg via ORAL
  Filled 2023-06-28 (×6): qty 1

## 2023-06-28 MED ORDER — TAMSULOSIN HCL 0.4 MG PO CAPS
0.8000 mg | ORAL_CAPSULE | Freq: Every day | ORAL | Status: DC
  Administered 2023-06-28 – 2023-07-02 (×5): 0.8 mg via ORAL
  Filled 2023-06-28 (×5): qty 2

## 2023-06-28 MED ORDER — OXYCODONE HCL 5 MG PO TABS
5.0000 mg | ORAL_TABLET | ORAL | Status: DC | PRN
  Administered 2023-06-29 – 2023-07-02 (×4): 5 mg via ORAL
  Filled 2023-06-28 (×4): qty 1

## 2023-06-28 MED ORDER — METHYLPREDNISOLONE SODIUM SUCC 40 MG IJ SOLR
40.0000 mg | Freq: Two times a day (BID) | INTRAMUSCULAR | Status: DC
  Administered 2023-06-28 – 2023-07-01 (×7): 40 mg via INTRAVENOUS
  Filled 2023-06-28 (×7): qty 1

## 2023-06-28 MED ORDER — PANTOPRAZOLE SODIUM 40 MG PO TBEC
40.0000 mg | DELAYED_RELEASE_TABLET | Freq: Every day | ORAL | Status: DC
  Administered 2023-06-28 – 2023-07-03 (×6): 40 mg via ORAL
  Filled 2023-06-28 (×6): qty 1

## 2023-06-28 MED ORDER — IPRATROPIUM-ALBUTEROL 0.5-2.5 (3) MG/3ML IN SOLN
3.0000 mL | Freq: Four times a day (QID) | RESPIRATORY_TRACT | Status: DC
  Administered 2023-06-28 – 2023-06-29 (×6): 3 mL via RESPIRATORY_TRACT
  Filled 2023-06-28 (×6): qty 3

## 2023-06-28 MED ORDER — ATORVASTATIN CALCIUM 20 MG PO TABS
20.0000 mg | ORAL_TABLET | Freq: Every day | ORAL | Status: DC
  Administered 2023-06-28 – 2023-07-02 (×5): 20 mg via ORAL
  Filled 2023-06-28 (×5): qty 1

## 2023-06-28 MED ORDER — METOPROLOL SUCCINATE ER 50 MG PO TB24: 50.0000 mg | ORAL_TABLET | Freq: Every day | ORAL | Status: DC

## 2023-06-28 MED ORDER — INSULIN GLARGINE-YFGN 100 UNIT/ML ~~LOC~~ SOLN
15.0000 [IU] | Freq: Every day | SUBCUTANEOUS | Status: DC
  Administered 2023-06-28 – 2023-06-29 (×2): 15 [IU] via SUBCUTANEOUS
  Filled 2023-06-28 (×2): qty 0.15

## 2023-06-28 NOTE — ED Notes (Signed)
 [name] updated as to patient's status with permission from patient.

## 2023-06-28 NOTE — ED Notes (Signed)
 Patient's bed linens changed. External catheter placed. Call bell within reach.

## 2023-06-28 NOTE — Consult Note (Signed)
 Consultation Note Date: 06/28/2023 at 1300  Patient Name: Peter Robertson  DOB: [birth year]  MRN: [id number]  Age / Sex: 83 y.o., male  PCP: Derenda Rockers, MD Referring Physician: Trudy Anthony HERO, MD  HPI/Patient Profile: 83 y.o. male  with past medical history of  hypertension, hyperlipidemia, diabetes mellitus, CAD, dCHF, sarcoidosis, dementia, depression, BPH, CKD-3A, A-fib not on anticoagulants, thrombocytopenia, left kidney hydronephrosis (s/p of nephrostomy tube)  admitted on 06/27/2023 with.  As per chart review, patient fell off of his wheelchair in his facility with no significant injury noted.  Upon presentation to ED, chest x-ray was within normal limits.  CT revealed no obstructing stone, mild left hydronephrosis with proximal hydroureter, marked bladder wall thickening, bronchial wall thickening with mucous plugs in the lower lungs, and splenomegaly.  UA was positive and urine culture is pending.  Patient is being treated for UTI with IV Rocephin , AKI on CKD (stage IIIa), proximal A-fib, sarcoidosis (continue steroids and bronchodilators), and dementia.  PMT was consulted to discuss goals of care.   Clinical Assessment and Goals of Care: Extensive chart review completed prior to meeting patient including labs, vital signs, imaging, progress notes, orders, and available advanced directive documents from current and previous encounters. I then met with patient at bedside in ED to discuss diagnosis prognosis, GOC, EOL wishes, disposition and options.  I introduced Palliative Medicine as specialized medical care for people living with serious illness. It focuses on providing relief from the symptoms and stress of a serious illness. The goal is to improve quality of life for both the patient and the family.  We discussed a brief life review of the patient.  Patient shares that he is a widow and has lost  all of his children.  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  He shares that he lives alone, walks independently, and is independent with all ADLs.  As per chart review, patient resides in a SNF and is wheelchair-bound.  When asked if he knows where he is, patient successfully answered that he was in the hospital.  When asked why he is in the hospital, patient shares he is really not sure.  He cannot recall reasons for why he was transferred to the hospital.  He believes he came from living at home alone to hospital.    Given patient's history of dementia, he is unable to participate in goals of care or medical decision making at this time.  However, symptoms assessed.  Discussed patient has audible wheezing.  He endorses that sometimes his breathing gets to be painful but that it is fine at the moment.  He endorses that he uses breathing treatments at home that help him.  Discussed that breathing treatments will continue to be provided here to help with his work of breathing.  Patient offers no acute complaints at this time.  No adjustment to [facility] needed at this time.  I attempted to elicit values and goals important to the patient.  He shares  he is not ready to give up.  He shares he wants to do what ever he can to get better.  In the event that patient is unable to speak for himself, he has named his caregiver [name] is his next of kin decision maker.  After visiting with the patient, attempted to speak with [name] over the phone.  No answer.  HIPAA compliant voicemail left with PMT contact info given.  Awaiting return phone call from [name] to continue goals of care discussions.  PMT will continue to follow and support patient and family throughout his hospitalization.  Primary Decision Maker NEXT OF KIN  Physical Exam Vitals reviewed.  Constitutional:      General: He is not in acute distress.    Appearance: He is normal weight.  HENT:     Head:  Normocephalic.     Mouth/Throat:     Mouth: Mucous membranes are moist.     Comments: Missing teeth, poor dentition Eyes:     Pupils: Pupils are equal, round, and reactive to light.  Cardiovascular:     Rate and Rhythm: Normal rate.     Pulses: Normal pulses.  Pulmonary:     Breath sounds: Wheezing present.  Abdominal:     Palpations: Abdomen is soft.  Musculoskeletal:     Comments: Generalized weakness  Skin:    General: Skin is warm and dry.  Neurological:     Mental Status: He is alert.     Comments: Oriented to self and place     Palliative Assessment/Data: 30%     Thank you for this consult. Palliative medicine will continue to follow and assist holistically.   Time Total: 75 minutes  Time spent includes: Detailed review of medical records (labs, imaging, vital signs), medically appropriate exam (mental status, respiratory, cardiac, skin), discussed with treatment team, counseling and educating patient, family and staff, documenting clinical information, medication management and coordination of care.  Signed by: Lamarr Gunner, DNP, FNP-BC Palliative Medicine   Please contact Palliative Medicine Team providers via [facility] for questions and concerns.

## 2023-06-28 NOTE — ED Notes (Signed)
 Patient found to have soiled brief due to urinary incontinence. This RN helped patient get cleaned up and placed patient in a new brief. Patient was placed on clean paper pads. Bed lowered to lowest position with call light in reach.

## 2023-06-28 NOTE — Progress Notes (Signed)
 PROGRESS NOTE    Peter Robertson  FMW:[id number] DOB: 1941/03/09 DOA: 06/27/2023 PCP: Derenda Rockers, MD  Assessment & Plan:   Principal Problem:   UTI (urinary tract infection) Active Problems:   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)   Hypertension   Chronic diastolic CHF (congestive heart failure) (HCC)   Acute renal failure superimposed on stage 3a chronic kidney disease (HCC)   Paroxysmal atrial fibrillation (HCC)   Type II diabetes mellitus with renal manifestations (HCC)   Sarcoidosis   Dementia without behavioral disturbance (HCC)   Hyperlipidemia   Thrombocytopenia (HCC)   BPH (benign prostatic hyperplasia)   Fall at home, initial encounter   Depression  Assessment and Plan: UTI: UA was positive. Urine cx is pending. Continue on IV rocephin     Hx of CAD: s/p two-vessel CABG 2011. Continue on statin    HTN: will restart metoprolol  at reduced dose. Continue to hold imdur , valsartan   Chronic diastolic CHF: appears compensated. Holding lasix . Monitor I/Os   AKI on CKDIIIa: likely due to UTI.  CT scan showed mild left hydronephrosis with proximal hydroureter,  no obstructing stone, which seems to be a chronic issue. Cr is trending down from day prior   PAF: restart metoprolol  but a reduced dose. Not on chronic anticoagulation    DM2: Recent A1c 6.8, well-controlled. Continue on glargine, SSI w/ accuchecks    Hx  of sarcoidosis: continue on steroids, bronchodilators   Dementia: continue w/ supportive care    HLD: continue on statin    Thrombocytopenia: chronic. Labile    BPH: continue on home dose of flomax     Fall: at home. PT/OT consulted   Depression: severity unknown. Continue on home dose of sertraline         DVT prophylaxis: heparin   Code Status: DNR  Family Communication: called pt's care taker, [name], no answer so I left a voicemail  Disposition Plan: depends on PT/OT recs  Level of care: Telemetry Medical  Status is:  Inpatient Remains inpatient appropriate because: severity of illness    Consultants:    Procedures:   Antimicrobials: rocephin    Subjective: Pt c/o fatigue   Objective: Vitals:   06/28/23 0400 06/28/23 0500 06/28/23 0600 06/28/23 0827  BP: (!) 129/50 (!) 131/51 (!) 131/38 131/83  Pulse: 74 73 63 64  Resp: 18 18 18 18   Temp:    97.6 F (36.4 C)  TempSrc:    Oral  SpO2: 95% 95% 99% 100%  Weight:      Height:        Intake/Output Summary (Last 24 hours) at 06/28/2023 0858 Last data filed at 06/27/2023 2256 Gross per 24 hour  Intake 608 ml  Output --  Net 608 ml   Filed Weights   06/27/23 1505  Weight: 81.6 kg    Examination:  General exam: Appears calm and comfortable  Respiratory system: Clear to auscultation. Respiratory effort normal. Cardiovascular system: S1 & S2+. No rubs, gallops or clicks.  Gastrointestinal system: Abdomen is nondistended, soft and nontender. Normal bowel sounds heard. Central nervous system: Alert and awake. Moves all extremities  Psychiatry: Judgement and insight appears at baseline. Flat mood and affect     Data Reviewed: I have personally reviewed following labs and imaging studies  CBC: Recent Labs  Lab 06/27/23 1508 06/28/23 0253  WBC 9.0 5.6  HGB 8.9* 7.9*  HCT 27.2* 23.2*  MCV 89.2 86.9  PLT 113* 91*   Basic Metabolic Panel: Recent Labs  Lab 06/27/23  1508 06/28/23 0253  NA 133* 136  K 4.8 4.4  CL 102 106  CO2 18* 19*  GLUCOSE 207* 147*  BUN 95* 95*  CREATININE 3.72* 3.45*  CALCIUM  8.2* 7.9*   GFR: Estimated Creatinine Clearance: 16.6 mL/min (A) (by C-G formula based on SCr of 3.45 mg/dL (H)). Liver Function Tests: Recent Labs  Lab 06/27/23 1508  AST 12*  ALT 15  ALKPHOS 60  BILITOT 0.7  PROT 7.0  ALBUMIN  3.6   No results for input(s): LIPASE, AMYLASE in the last 168 hours. No results for input(s): AMMONIA in the last 168 hours. Coagulation Profile: No results for input(s): INR, PROTIME  in the last 168 hours. Cardiac Enzymes: No results for input(s): CKTOTAL, CKMB, CKMBINDEX, TROPONINI in the last 168 hours. BNP (last 3 results) No results for input(s): PROBNP in the last 8760 hours. HbA1C: No results for input(s): HGBA1C in the last 72 hours. CBG: Recent Labs  Lab 06/27/23 2306 06/28/23 0830  GLUCAP 207* 88   Lipid Profile: No results for input(s): CHOL, HDL, LDLCALC, TRIG, CHOLHDL, LDLDIRECT in the last 72 hours. Thyroid Function Tests: No results for input(s): TSH, T4TOTAL, FREET4, T3FREE, THYROIDAB in the last 72 hours. Anemia Panel: No results for input(s): VITAMINB12, FOLATE, FERRITIN, TIBC, IRON, RETICCTPCT in the last 72 hours. Sepsis Labs: Recent Labs  Lab 06/27/23 2216  LATICACIDVEN 0.7    Recent Results (from the past 240 hours)  Resp panel by RT-PCR (RSV, Flu A&B, Covid) Anterior Nasal Swab     Status: None   Collection Time: 06/27/23  3:09 PM   Specimen: Anterior Nasal Swab  Result Value Ref Range Status   SARS Coronavirus 2 by RT PCR NEGATIVE NEGATIVE Final    Comment: (NOTE) SARS-CoV-2 target nucleic acids are NOT DETECTED.  The SARS-CoV-2 RNA is generally detectable in upper respiratory specimens during the acute phase of infection. The lowest concentration of SARS-CoV-2 viral copies this assay can detect is 138 copies/mL. A negative result does not preclude SARS-Cov-2 infection and should not be used as the sole basis for treatment or other patient management decisions. A negative result may occur with  improper specimen collection/handling, submission of specimen other than nasopharyngeal swab, presence of viral mutation(s) within the areas targeted by this assay, and inadequate number of viral copies(<138 copies/mL). A negative result must be combined with clinical observations, patient history, and epidemiological information. The expected result is Negative.  Fact Sheet for Patients:   bloggercourse.com  Fact Sheet for Healthcare Providers:  seriousbroker.it  This test is no t yet approved or cleared by the [place]  FDA and  has been authorized for detection and/or diagnosis of SARS-CoV-2 by FDA under an Emergency Use Authorization (EUA). This EUA will remain  in effect (meaning this test can be used) for the duration of the COVID-19 declaration under Section 564(b)(1) of the Act, 21 U.S.C.section 360bbb-3(b)(1), unless the authorization is terminated  or revoked sooner.       Influenza A by PCR NEGATIVE NEGATIVE Final   Influenza B by PCR NEGATIVE NEGATIVE Final    Comment: (NOTE) The Xpert Xpress SARS-CoV-2/FLU/RSV plus assay is intended as an aid in the diagnosis of influenza from Nasopharyngeal swab specimens and should not be used as a sole basis for treatment. Nasal washings and aspirates are unacceptable for Xpert Xpress SARS-CoV-2/FLU/RSV testing.  Fact Sheet for Patients: bloggercourse.com  Fact Sheet for Healthcare Providers: seriousbroker.it  This test is not yet approved or cleared by the [place]  FDA and  has been authorized for detection and/or diagnosis of SARS-CoV-2 by FDA under an Emergency Use Authorization (EUA). This EUA will remain in effect (meaning this test can be used) for the duration of the COVID-19 declaration under Section 564(b)(1) of the Act, 21 U.S.C. section 360bbb-3(b)(1), unless the authorization is terminated or revoked.     Resp Syncytial Virus by PCR NEGATIVE NEGATIVE Final    Comment: (NOTE) Fact Sheet for Patients: bloggercourse.com  Fact Sheet for Healthcare Providers: seriousbroker.it  This test is not yet approved or cleared by the [place]  FDA and has been authorized for detection and/or diagnosis of SARS-CoV-2 by FDA under an Emergency Use  Authorization (EUA). This EUA will remain in effect (meaning this test can be used) for the duration of the COVID-19 declaration under Section 564(b)(1) of the Act, 21 U.S.C. section 360bbb-3(b)(1), unless the authorization is terminated or revoked.  Performed at [facility], [address] [place], [place] [address]   Blood culture (routine x 2)     Status: None (Preliminary result)   Collection Time: 06/27/23  7:56 PM   Specimen: BLOOD  Result Value Ref Range Status   Specimen Description BLOOD RIGHT ARM  Final   Special Requests   Final    BOTTLES DRAWN AEROBIC AND ANAEROBIC Blood Culture results may not be optimal due to an inadequate volume of blood received in culture bottles   Culture   Final    NO GROWTH < 12 HOURS Performed at [facility], [address], [place], [place] [address]    Report Status PENDING  Incomplete  Blood culture (routine x 2)     Status: None (Preliminary result)   Collection Time: 06/27/23  7:56 PM   Specimen: BLOOD  Result Value Ref Range Status   Specimen Description BLOOD LEFT ARM  Final   Special Requests   Final    BOTTLES DRAWN AEROBIC AND ANAEROBIC Blood Culture results may not be optimal due to an inadequate volume of blood received in culture bottles   Culture   Final    NO GROWTH < 12 HOURS Performed at [facility], [address], [place], [place] [address]    Report Status PENDING  Incomplete         Radiology Studies: CT Renal Stone Study Result Date: 06/27/2023 CLINICAL DATA:  Abdominal/flank pain, stone suspected, evaluation for obstruction. Fell off the bed at the doctor's office. EXAM: CT ABDOMEN AND PELVIS WITHOUT CONTRAST TECHNIQUE: Multidetector CT imaging of the abdomen and pelvis was performed following the standard protocol without IV contrast. RADIATION DOSE REDUCTION: This exam was performed according to the departmental dose-optimization program which includes automated exposure control,  adjustment of the mA and/or kV according to patient size and/or use of iterative reconstruction technique. COMPARISON:  11/18/2022 FINDINGS: Lower chest: Calcified pleural plaques. Bronchial wall thickening and mucous plugging. Question right hilar adenopathy. Hepatobiliary: No acute abnormality. Pancreas: Coarse calcifications in the pancreatic head. No acute inflammation or ductal dilation. Spleen: Similar splenomegaly. Adrenals/Urinary Tract: Unremarkable adrenal glands. Mild left hydronephrosis with proximal hydroureter. No obstructing stone. Marked bladder wall thickening. Stomach/Bowel: Normal caliber large and small bowel. No bowel wall thickening moderate colonic stool load. Stomach and appendix are within normal limits. Vascular/Lymphatic: Aortic atherosclerosis. No enlarged abdominal or pelvic lymph nodes. Reproductive: Enlarged prostate. Other: No free intraperitoneal fluid or air. Musculoskeletal: No acute fracture. Chronic compression fracture of L1. IMPRESSION: 1. Mild left hydronephrosis with proximal hydroureter. No obstructing stone. 2. Marked bladder wall thickening,  increased from prior. Differential considerations include obstructive uropathy from enlarged prostate, cystitis, or infiltrative malignancy. 3. Bronchial wall thickening and mucous plugs in the lower lungs. 4. Splenomegaly. Aortic Atherosclerosis (ICD10-I70.0). Electronically Signed   By: Norman Gatlin M.D.   On: 06/27/2023 20:27   DG Chest 2 View Result Date: 06/27/2023 CLINICAL DATA:  Wheezing.  Sarcoidosis. EXAM: CHEST - 2 VIEW COMPARISON:  Chest radiograph dated 11/18/2022. 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 No new consolidation. There is no pleural effusion pneumothorax. Stable cardiomediastinal silhouette. Median sternotomy wires. No acute osseous pathology. IMPRESSION: 1. No acute cardiopulmonary process. 2. Chronic calcified plaques in keeping with known pulmonary  sarcoidosis. Electronically Signed   By: Vanetta Chou M.D.   On: 06/27/2023 16:54   CT Head Wo Contrast Result Date: 06/27/2023 CLINICAL DATA:  Provided history: Head trauma, minor.  Fall. EXAM: CT HEAD WITHOUT CONTRAST CT CERVICAL SPINE WITHOUT CONTRAST TECHNIQUE: Multidetector CT imaging of the head and cervical spine was performed following the standard protocol without intravenous contrast. Multiplanar CT image reconstructions of the cervical spine were also generated. RADIATION DOSE REDUCTION: This exam was performed according to the departmental dose-optimization program which includes automated exposure control, adjustment of the mA and/or kV according to patient size and/or use of iterative reconstruction technique. COMPARISON:  Head CT 05/05/2021. Cervical spine CT 05/05/2021. Chest CT 08/27/2019. Thyroid ultrasound 05/16/2021. FINDINGS: CT HEAD FINDINGS Brain: Generalized cerebral atrophy. Commensurate prominence of the ventricles and sulci. 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 This is new from the prior head CT of 05/05/2021. Mild patchy and ill-defined hypoattenuation elsewhere within the cerebral white matter, nonspecific but compatible chronic small vessel ischemic disease. There is no acute intracranial hemorrhage. No demarcated cortical infarct. No extra-axial fluid collection. No evidence of an intracranial mass. No midline shift. Vascular: No hyperdense vessel.  Atherosclerotic calcifications. Skull: No calvarial fracture or aggressive osseous lesion. Apparent small burr hole within the right frontal calvarium. Sinuses/Orbits: No mass or acute finding within the imaged orbits. Mucosal thickening within the bilateral frontal sinuses (mild right, moderate left). Bilateral ethmoid sinusitis (moderate right, severe left). Mucosal thickening within the bilateral sphenoid sinuses (mild right, moderate left). CT CERVICAL SPINE FINDINGS  Alignment: Mild levocurvature of the cervical spine. Nonspecific reversal of the expected cervical lordosis. 2 mm grade 1 anterolisthesis at C2-C3 and C3-C4. 3 mm grade 1 anterolisthesis at C4-C5. Skull base and vertebrae: The basion-dental and atlanto-dental intervals are maintained.No evidence of acute fracture to the cervical spine. Soft tissues and spinal canal: No prevertebral fluid or swelling. No visible canal hematoma. Disc levels: Cervical spondylosis with multilevel disc space narrowing, disc bulges/central disc protrusions, posterior disc osteophyte complexes, uncovertebral hypertrophy and facet arthrosis. Disc space narrowing is greatest at C5-C6 and C6-C7 (advanced at these levels). Multilevel spinal canal narrowing. Most notably, disc protrusions contribute to apparent moderate spinal canal stenosis at C2-C3 and C3-C4, and a posterior disc osteophyte complex contributes to apparent moderate spinal canal stenosis at C6-C7. Multilevel bony neural foraminal narrowing. Upper chest: No acute finding. Prior median sternotomy. Chronic nodule/foci of scarring (with associated calcifications) again demonstrated within imaged portions of the lung apices. Per prior radiology records, the patient has a history of sarcoidosis. Other: Known 2.2 cm right thyroid lobe nodule. This nodule was previously assessed with thyroid ultrasound on 05/16/2021. Please refer to this prior examination for further description and for recommendations. IMPRESSION: CT head: 1. No evidence of an acute intracranial  abnormality. 2. Parenchymal atrophy and chronic small vessel ischemic disease, as described. 3. Paranasal sinus disease at the imaged levels as outlined. CT cervical spine: 1. No evidence of an acute cervical spine fracture. 2. Nonspecific reversal of the expected cervical lordosis. 3. Mild Levocurvature of the cervical spine. 4. Grade 1 anterolisthesis at C2-C3, C3-C4 and C4-C5. 5. Cervical spondylosis as described.  Electronically Signed   By: Rockey Childs D.O.   On: 06/27/2023 16:03   CT Cervical Spine Wo Contrast Result Date: 06/27/2023 CLINICAL DATA:  Provided history: Head trauma, minor.  Fall. EXAM: CT HEAD WITHOUT CONTRAST CT CERVICAL SPINE WITHOUT CONTRAST TECHNIQUE: Multidetector CT imaging of the head and cervical spine was performed following the standard protocol without intravenous contrast. Multiplanar CT image reconstructions of the cervical spine were also generated. RADIATION DOSE REDUCTION: This exam was performed according to the departmental dose-optimization program which includes automated exposure control, adjustment of the mA and/or kV according to patient size and/or use of iterative reconstruction technique. COMPARISON:  Head CT 05/05/2021. Cervical spine CT 05/05/2021. Chest CT 08/27/2019. Thyroid ultrasound 05/16/2021. FINDINGS: CT HEAD FINDINGS Brain: Generalized cerebral atrophy. Commensurate prominence of the ventricles and sulci. 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 This is new from the prior head CT of 05/05/2021. Mild patchy and ill-defined hypoattenuation elsewhere within the cerebral white matter, nonspecific but compatible chronic small vessel ischemic disease. There is no acute intracranial hemorrhage. No demarcated cortical infarct. No extra-axial fluid collection. No evidence of an intracranial mass. No midline shift. Vascular: No hyperdense vessel.  Atherosclerotic calcifications. Skull: No calvarial fracture or aggressive osseous lesion. Apparent small burr hole within the right frontal calvarium. Sinuses/Orbits: No mass or acute finding within the imaged orbits. Mucosal thickening within the bilateral frontal sinuses (mild right, moderate left). Bilateral ethmoid sinusitis (moderate right, severe left). Mucosal thickening within the bilateral sphenoid sinuses (mild right, moderate left). CT CERVICAL SPINE FINDINGS Alignment: Mild  levocurvature of the cervical spine. Nonspecific reversal of the expected cervical lordosis. 2 mm grade 1 anterolisthesis at C2-C3 and C3-C4. 3 mm grade 1 anterolisthesis at C4-C5. Skull base and vertebrae: The basion-dental and atlanto-dental intervals are maintained.No evidence of acute fracture to the cervical spine. Soft tissues and spinal canal: No prevertebral fluid or swelling. No visible canal hematoma. Disc levels: Cervical spondylosis with multilevel disc space narrowing, disc bulges/central disc protrusions, posterior disc osteophyte complexes, uncovertebral hypertrophy and facet arthrosis. Disc space narrowing is greatest at C5-C6 and C6-C7 (advanced at these levels). Multilevel spinal canal narrowing. Most notably, disc protrusions contribute to apparent moderate spinal canal stenosis at C2-C3 and C3-C4, and a posterior disc osteophyte complex contributes to apparent moderate spinal canal stenosis at C6-C7. Multilevel bony neural foraminal narrowing. Upper chest: No acute finding. Prior median sternotomy. Chronic nodule/foci of scarring (with associated calcifications) again demonstrated within imaged portions of the lung apices. Per prior radiology records, the patient has a history of sarcoidosis. Other: Known 2.2 cm right thyroid lobe nodule. This nodule was previously assessed with thyroid ultrasound on 05/16/2021. Please refer to this prior examination for further description and for recommendations. IMPRESSION: CT head: 1. No evidence of an acute intracranial abnormality. 2. Parenchymal atrophy and chronic small vessel ischemic disease, as described. 3. Paranasal sinus disease at the imaged levels as outlined. CT cervical spine: 1. No evidence of an acute cervical spine fracture. 2. Nonspecific reversal of the expected cervical lordosis. 3. Mild Levocurvature of the cervical spine. 4. Grade 1 anterolisthesis  at C2-C3, C3-C4 and C4-C5. 5. Cervical spondylosis as described. Electronically Signed    By: Rockey Childs D.O.   On: 06/27/2023 16:03        Scheduled Meds:  atorvastatin   20 mg Oral QAC supper   heparin   5,000 Units Subcutaneous Q8H   insulin  aspart  0-5 Units Subcutaneous QHS   insulin  aspart  0-9 Units Subcutaneous TID WC   insulin  glargine-yfgn  15 Units Subcutaneous Daily   ipratropium-albuterol   3 mL Nebulization Q6H   pantoprazole   40 mg Oral Daily   predniSONE   50 mg Oral Q breakfast   sertraline   50 mg Oral Daily   tamsulosin   0.8 mg Oral QHS   Continuous Infusions:  sodium chloride  75 mL/hr at 06/28/23 0847   cefTRIAXone  (ROCEPHIN )  IV       LOS: 1 day        Anthony CHRISTELLA Pouch, MD [facility] Pager [phone]  If 7PM-7AM, please contact night-coverage [email or web address] 06/28/2023, 8:58 AM

## 2023-06-28 NOTE — ED Notes (Signed)
 Patient assisted into a comfortable position to eat breakfast. Bed placed in lowest position with call light in reach.

## 2023-06-28 NOTE — ED Notes (Signed)
 Patient is resting comfortably.

## 2023-06-28 NOTE — ED Notes (Signed)
 Pt rolled side to side, chux pads and brief were replaced, perineal care performed, new warm blankets provided. Large urine output noted. Pt states he's comfortable and has no current needs

## 2023-06-29 ENCOUNTER — Encounter: Payer: Self-pay | Admitting: Internal Medicine

## 2023-06-29 DIAGNOSIS — N3000 Acute cystitis without hematuria: Secondary | ICD-10-CM | POA: Diagnosis not present

## 2023-06-29 DIAGNOSIS — Z515 Encounter for palliative care: Secondary | ICD-10-CM

## 2023-06-29 DIAGNOSIS — R4182 Altered mental status, unspecified: Secondary | ICD-10-CM

## 2023-06-29 LAB — URINE CULTURE

## 2023-06-29 LAB — CBC
HCT: 27.1 % — ABNORMAL LOW (ref 39.0–52.0)
Hemoglobin: 8.9 g/dL — ABNORMAL LOW (ref 13.0–17.0)
MCH: 29.4 pg (ref 26.0–34.0)
MCHC: 32.8 g/dL (ref 30.0–36.0)
MCV: 89.4 fL (ref 80.0–100.0)
Platelets: 86 10*3/uL — ABNORMAL LOW (ref 150–400)
RBC: 3.03 MIL/uL — ABNORMAL LOW (ref 4.22–5.81)
RDW: 15.2 % (ref 11.5–15.5)
WBC: 4.4 10*3/uL (ref 4.0–10.5)
nRBC: 0 % (ref 0.0–0.2)

## 2023-06-29 LAB — BASIC METABOLIC PANEL
Anion gap: 14 (ref 5–15)
BUN: 88 mg/dL — ABNORMAL HIGH (ref 8–23)
CO2: 16 mmol/L — ABNORMAL LOW (ref 22–32)
Calcium: 8.2 mg/dL — ABNORMAL LOW (ref 8.9–10.3)
Chloride: 104 mmol/L (ref 98–111)
Creatinine, Ser: 2.9 mg/dL — ABNORMAL HIGH (ref 0.61–1.24)
GFR, Estimated: 21 mL/min — ABNORMAL LOW (ref >60)
Glucose, Bld: 295 mg/dL — ABNORMAL HIGH (ref 70–99)
Potassium: 4.8 mmol/L (ref 3.5–5.1)
Sodium: 134 mmol/L — ABNORMAL LOW (ref 135–145)

## 2023-06-29 LAB — GLUCOSE, CAPILLARY
Glucose-Capillary: 297 mg/dL — ABNORMAL HIGH (ref 70–99)
Glucose-Capillary: 289 mg/dL — ABNORMAL HIGH (ref 70–99)
Glucose-Capillary: 210 mg/dL — ABNORMAL HIGH (ref 70–99)
Glucose-Capillary: 204 mg/dL — ABNORMAL HIGH (ref 70–99)

## 2023-06-29 MED ORDER — IPRATROPIUM-ALBUTEROL 0.5-2.5 (3) MG/3ML IN SOLN
3.0000 mL | Freq: Two times a day (BID) | RESPIRATORY_TRACT | Status: DC
Start: 2023-06-29 — End: 2023-06-29
  Filled 2023-06-29: qty 3

## 2023-06-29 MED ORDER — BENZONATATE 100 MG PO CAPS
200.0000 mg | ORAL_CAPSULE | Freq: Two times a day (BID) | ORAL | Status: DC
  Administered 2023-06-29 – 2023-07-03 (×9): 200 mg via ORAL
  Filled 2023-06-29 (×9): qty 2

## 2023-06-29 MED ORDER — INSULIN GLARGINE-YFGN 100 UNIT/ML ~~LOC~~ SOLN
20.0000 [IU] | Freq: Every day | SUBCUTANEOUS | Status: DC
  Administered 2023-06-30 – 2023-07-03 (×4): 20 [IU] via SUBCUTANEOUS
  Filled 2023-06-29 (×4): qty 0.2

## 2023-06-29 MED ORDER — INSULIN GLARGINE-YFGN 100 UNIT/ML ~~LOC~~ SOLN
5.0000 [IU] | Freq: Once | SUBCUTANEOUS | Status: AC
  Administered 2023-06-29: 5 [IU] via SUBCUTANEOUS
  Filled 2023-06-29: qty 0.05

## 2023-06-29 MED ORDER — INSULIN ASPART 100 UNIT/ML IJ SOLN
4.0000 [IU] | Freq: Three times a day (TID) | INTRAMUSCULAR | Status: DC
  Administered 2023-06-29 – 2023-07-03 (×12): 4 [IU] via SUBCUTANEOUS
  Filled 2023-06-29 (×12): qty 1

## 2023-06-29 NOTE — Evaluation (Signed)
 Occupational Therapy Evaluation Patient Details Name: Peter Robertson MRN: [id number] DOB: [birth year] Today's Date: 06/29/2023   History of Present Illness 83 y.o. male with past medical history of  hypertension, hyperlipidemia, diabetes mellitus, CAD, dCHF, sarcoidosis, dementia, depression, BPH, CKD-3A, A-fib not on anticoagulants, thrombocytopenia, left kidney hydronephrosis (s/p of nephrostomy tube) presented to ED on 06/27/2023 after fall out of his wheelchair at his facility. Admitted with UTI.   Clinical Impression   Pt was seen for OT evaluation and co-tx with PT this date. Prior to hospital admission, pt reports he was living with a caregiver who assisted only with IADL. Pt denies falls and reports ambulating with 2WW in the house and using w/c only for community mobility. Pt oriented to self only and requires cues for safety/sequencing throughout the session. Pt presents to acute OT demonstrating impaired ADL performance and functional mobility 2/2 decreased strength, activity tolerance, cognition, and balance (See OT problem list for additional functional deficits). Pt currently requires CGA for bed mobility, MIN-MOD A +2 for STS and step pivot to the recliner with the RW. MIN-MOD A for LB ADL tasks involving transfers. SpO2 remained >98% on RA. Pt would benefit from skilled OT services to address noted impairments and functional limitations (see below for any additional details) in order to maximize safety and independence while minimizing falls risk and caregiver burden.    If plan is discharge home, recommend the following: Two people to help with walking and/or transfers;A lot of help with bathing/dressing/bathroom;Assistance with cooking/housework;Assist for transportation;Help with stairs or ramp for entrance;Direct supervision/assist for medications management;Direct supervision/assist for financial management;Supervision due to cognitive status    Functional Status Assessment  Patient  has had a recent decline in their functional status and demonstrates the ability to make significant improvements in function in a reasonable and predictable amount of time.  Equipment Recommendations  Other (comment) (defer)    Recommendations for Other Services       Precautions / Restrictions Precautions Precautions: Fall Restrictions Weight Bearing Restrictions Per Provider Order: No      Mobility Bed Mobility Overal bed mobility: Needs Assistance Bed Mobility: Supine to Sit     Supine to sit: Contact guard, HOB elevated, Used rails          Transfers Overall transfer level: Needs assistance Equipment used: Rolling walker (2 wheels) Transfers: Sit to/from Stand Sit to Stand: Min assist, Mod assist, +2 physical assistance                  Balance Overall balance assessment: Needs assistance Sitting-balance support: Feet supported, Single extremity supported, Bilateral upper extremity supported, No upper extremity supported Sitting balance-Leahy Scale: Fair   Postural control: Posterior lean Standing balance support: Bilateral upper extremity supported, Reliant on assistive device for balance, During functional activity Standing balance-Leahy Scale: Poor                             ADL either performed or assessed with clinical judgement   ADL Overall ADL's : Needs assistance/impaired                     Lower Body Dressing: Sit to/from stand;Moderate assistance   Toilet Transfer: +2 for physical assistance;Minimal assistance;Moderate assistance;Rolling walker (2 wheels);BSC/3in1 Toilet Transfer Details (indicate cue type and reason): simulated Toileting- Clothing Manipulation and Hygiene: Maximal assistance;Sit to/from stand       Functional mobility during ADLs: Moderate assistance;Minimal assistance;Rolling walker (2  wheels);Cueing for sequencing;Cueing for safety       Vision         Perception         Praxis          Pertinent Vitals/Pain Pain Assessment Pain Assessment: No/denies pain     Extremity/Trunk Assessment Upper Extremity Assessment Upper Extremity Assessment: Generalized weakness   Lower Extremity Assessment Lower Extremity Assessment: Generalized weakness       Communication Communication Cueing Techniques: Verbal cues;Visual cues   Cognition Arousal: Alert Behavior During Therapy: WFL for tasks assessed/performed Overall Cognitive Status: No family/caregiver present to determine baseline cognitive functioning                                 General Comments: Pt oriented to self only, requires cues for safety, sequencing     General Comments  Pt on 2L at start of session, SpO2 98%. Removed O2 and at end of session, SpO2 98-99% on RA. Left on RA.    Exercises     Shoulder Instructions      Home Living Family/patient expects to be discharged to:: Unsure                                 Additional Comments: Pt is poor historian, pt reporting he lives at home with a caregiver but chart notes a facility. Has an ankle monitor on.      Prior Functioning/Environment Prior Level of Function : History of Falls (last six months);Needs assist;Patient poor historian/Family not available             Mobility Comments: Pt reports using RW in the home and using w/c out of the home - no caregiver present to verify ADLs Comments: Pt reports he is indep with ADL and caregiver assists with IADL        OT Problem List: Decreased strength;Decreased cognition;Decreased activity tolerance;Decreased safety awareness;Impaired balance (sitting and/or standing);Decreased knowledge of use of DME or AE      OT Treatment/Interventions: Self-care/ADL training;Therapeutic exercise;Therapeutic activities;Cognitive remediation/compensation;DME and/or AE instruction;Patient/family education;Balance training    OT Goals(Current goals can be found in the care plan  section) Acute Rehab OT Goals Patient Stated Goal: go home OT Goal Formulation: With patient Time For Goal Achievement: 07/13/23 Potential to Achieve Goals: Good ADL Goals Pt Will Perform Lower Body Dressing: sit to/from stand;with min assist Pt Will Transfer to Toilet: ambulating;with contact guard assist;bedside commode (LRAD) Pt Will Perform Toileting - Clothing Manipulation and hygiene: sitting/lateral leans;with supervision  OT Frequency: Min 1X/week    Co-evaluation PT/OT/SLP Co-Evaluation/Treatment: Yes Reason for Co-Treatment: For patient/therapist safety;To address functional/ADL transfers PT goals addressed during session: Mobility/safety with mobility;Balance;Proper use of DME OT goals addressed during session: ADL's and self-care;Proper use of Adaptive equipment and DME      AM-PAC OT 6 Clicks Daily Activity     Outcome Measure Help from another person eating meals?: None Help from another person taking care of personal grooming?: A Little Help from another person toileting, which includes using toliet, bedpan, or urinal?: A Lot Help from another person bathing (including washing, rinsing, drying)?: A Lot Help from another person to put on and taking off regular upper body clothing?: A Little Help from another person to put on and taking off regular lower body clothing?: A Lot 6 Click Score: 16   End of Session  Equipment Utilized During Treatment: Rolling walker (2 wheels) Nurse Communication: Mobility status  Activity Tolerance: Patient tolerated treatment well Patient left: in chair;with call bell/phone within reach;with chair alarm set  OT Visit Diagnosis: Other abnormalities of gait and mobility (R26.89);Muscle weakness (generalized) (M62.81);History of falling (Z91.81)                Time: 8944-8888 OT Time Calculation (min): 16 min Charges:  OT General Charges $OT Visit: 1 Visit OT Evaluation $OT Eval Low Complexity: 1 Low  Warren SAUNDERS., MPH, MS,  OTR/L ascom [phone] 06/29/23, 11:36 AM

## 2023-06-29 NOTE — Evaluation (Signed)
 Physical Therapy Evaluation Patient Details Name: Peter Robertson MRN: [id number] DOB: Oct 15, 1940 Today's Date: 06/29/2023  History of Present Illness  83 y.o. male with past medical history of  hypertension, hyperlipidemia, diabetes mellitus, CAD, dCHF, sarcoidosis, dementia, depression, BPH, CKD-3A, A-fib not on anticoagulants, thrombocytopenia, left kidney hydronephrosis (s/p of nephrostomy tube) presented to ED on 06/27/2023 after fall out of his wheelchair at his facility. Admitted with UTI.   Clinical Impression  Patient received in bed, he is pleasant. Wants to go home. Patient is oriented only to self. No family/caregiver present. He is cooperative with assessment. Requires cga for bed mobility and mod +2 assist for sit to stand and stepping over to recliner with RW. Posterior leaning in standing. Patient is at high fall risk. Chair alarm set and RN notified. He will continue to benefit from skilled PT to improve safety and independence.          If plan is discharge home, recommend the following: A lot of help with walking and/or transfers;A lot of help with bathing/dressing/bathroom;Help with stairs or ramp for entrance;Assist for transportation;Direct supervision/assist for medications management;Direct supervision/assist for financial management   Can travel by private vehicle   No    Equipment Recommendations None recommended by PT  Recommendations for Other Services       Functional Status Assessment Patient has had a recent decline in their functional status and demonstrates the ability to make significant improvements in function in a reasonable and predictable amount of time.     Precautions / Restrictions Precautions Precautions: Fall Restrictions Weight Bearing Restrictions Per Provider Order: No      Mobility  Bed Mobility Overal bed mobility: Needs Assistance Bed Mobility: Supine to Sit     Supine to sit: Contact guard, Used rails, HOB elevated           Transfers Overall transfer level: Needs assistance Equipment used: Rolling walker (2 wheels) Transfers: Sit to/from Stand Sit to Stand: Mod assist, +2 physical assistance                Ambulation/Gait Ambulation/Gait assistance: Mod assist, +2 physical assistance, +2 safety/equipment Gait Distance (Feet): 3 Feet Assistive device: Rolling walker (2 wheels) Gait Pattern/deviations: Step-to pattern Gait velocity: decr     General Gait Details: step pivot to recliner, heavy posterior leaning in standing  Stairs            Wheelchair Mobility     Tilt Bed    Modified Rankin (Stroke Patients Only)       Balance Overall balance assessment: Needs assistance Sitting-balance support: Feet supported Sitting balance-Leahy Scale: Good   Postural control: Posterior lean Standing balance support: Bilateral upper extremity supported, During functional activity, Reliant on assistive device for balance Standing balance-Leahy Scale: Poor                               Pertinent Vitals/Pain Pain Assessment Pain Assessment: No/denies pain    Home Living Family/patient expects to be discharged to:: Skilled nursing facility Living Arrangements: Alone                 Additional Comments: Pt is poor historian, pt reporting he lives at home with a caregiver but chart notes a facility. Has an ankle monitor on.    Prior Function Prior Level of Function : History of Falls (last six months);Needs assist;Patient poor historian/Family not available  Mobility Comments: Pt reports using RW in the home and using w/c out of the home - no caregiver present to verify ADLs Comments: Pt reports he is indep with ADL and caregiver assists with IADL     Extremity/Trunk Assessment   Upper Extremity Assessment Upper Extremity Assessment: Generalized weakness    Lower Extremity Assessment Lower Extremity Assessment: Generalized weakness     Cervical / Trunk Assessment Cervical / Trunk Assessment: Normal  Communication   Communication Communication: No apparent difficulties Cueing Techniques: Verbal cues;Visual cues  Cognition Arousal: Alert Behavior During Therapy: WFL for tasks assessed/performed Overall Cognitive Status: No family/caregiver present to determine baseline cognitive functioning                                 General Comments: Pt oriented to self only, requires cues for safety, sequencing        General Comments General comments (skin integrity, edema, etc.): Pt on 2L at start of session, SpO2 98%. Removed O2 and at end of session, SpO2 98-99% on RA. Left on RA.    Exercises     Assessment/Plan    PT Assessment Patient needs continued PT services  PT Problem List Decreased strength;Decreased balance;Decreased mobility;Decreased activity tolerance;Decreased cognition;Decreased safety awareness       PT Treatment Interventions DME instruction;Gait training;Functional mobility training;Therapeutic activities;Therapeutic exercise;Balance training;Cognitive remediation;Patient/family education    PT Goals (Current goals can be found in the Care Plan section)  Acute Rehab PT Goals Patient Stated Goal: to go home PT Goal Formulation: Patient unable to participate in goal setting Time For Goal Achievement: 07/13/23    Frequency Min 1X/week     Co-evaluation PT/OT/SLP Co-Evaluation/Treatment: Yes Reason for Co-Treatment: To address functional/ADL transfers;For patient/therapist safety;Necessary to address cognition/behavior during functional activity PT goals addressed during session: Mobility/safety with mobility;Balance;Proper use of DME OT goals addressed during session: ADL's and self-care;Proper use of Adaptive equipment and DME       AM-PAC PT 6 Clicks Mobility  Outcome Measure Help needed turning from your back to your side while in a flat bed without using bedrails?: A  Little Help needed moving from lying on your back to sitting on the side of a flat bed without using bedrails?: A Little Help needed moving to and from a bed to a chair (including a wheelchair)?: A Lot Help needed standing up from a chair using your arms (e.g., wheelchair or bedside chair)?: A Lot Help needed to walk in hospital room?: A Lot Help needed climbing 3-5 steps with a railing? : Total 6 Click Score: 13    End of Session Equipment Utilized During Treatment: Oxygen Activity Tolerance: Patient limited by fatigue Patient left: in chair;with call bell/phone within reach;with chair alarm set Nurse Communication: Mobility status PT Visit Diagnosis: Unsteadiness on feet (R26.81);Other abnormalities of gait and mobility (R26.89);Muscle weakness (generalized) (M62.81);History of falling (Z91.81);Difficulty in walking, not elsewhere classified (R26.2)    Time: 8940-8888 PT Time Calculation (min) (ACUTE ONLY): 12 min   Charges:   PT Evaluation $PT Eval Moderate Complexity: 1 Mod   PT General Charges $$ ACUTE PT VISIT: 1 Visit         Syanna Remmert, PT, GCS 06/29/23,11:48 AM

## 2023-06-29 NOTE — TOC Progression Note (Signed)
 Transition of Care [facility]) - Progression Note    Patient Details  Name: Peter Robertson MRN: [id number] Date of Birth: 10-Apr-1941  Transition of Care [facility]) CM/SW Contact  Fotini Lemus A Blessed Girdner, RN Phone Number: 06/29/2023, 2:56 PM  Clinical Narrative:    Chart reviewed.  Noted that patient is Long-term care resident at [facility].  I have confirmed with [name] Ford that patient is a resident at [facility].    I have spoken with Barnie with [facility].  Barnie is able to accept patient back to the facility on Monday.    TOC will continue to follow for discharge planning.          Expected Discharge Plan and Services                                               Social Determinants of Health (SDOH) Interventions SDOH Screenings   Food Insecurity: No Food Insecurity (06/29/2023)  Housing: Low Risk  (06/29/2023)  Transportation Needs: No Transportation Needs (06/29/2023)  Utilities: Not At Risk (06/29/2023)  Financial Resource Strain: Low Risk  (07/12/2022)   Received from [facility]  Physical Activity: Insufficiently Active (08/26/2020)   Received from [facility], [facility]  Social Connections: Moderately Isolated (06/29/2023)  Stress: Stress Concern Present (08/26/2020)   Received from [facility], [facility]  Tobacco Use: Medium Risk (06/29/2023)  Health Literacy: High Risk (08/11/2022)   Received from [facility]    Readmission Risk Interventions     No data to display

## 2023-06-29 NOTE — Progress Notes (Addendum)
 [facility] [place] Liaison note:   Received a referral from [facility], Nichole Jobs, RN, for Hospice services to follow patient at [facility] LTC.  According to Palliative Provider, Waddell Arloa PIETY, patient's friend, [name] Ford, requested that [facility] follow patient.  Left a message and text with [name], asking that she call to discuss.  Waiting for a response.  Referral submitted today.    Please call with any Hospice related questions or concerns.  Thank you for the opportunity to participate in the patient's care.     [facility] Liaison [phone]

## 2023-06-29 NOTE — Progress Notes (Addendum)
 Daily Progress Note   Patient Name: Peter Robertson       Date: 06/29/2023 DOB: 1940/09/27  Age: 83 y.o. MRN#: [id number] Attending Physician: Trudy Anthony HERO, MD Primary Care Physician: Derenda Rockers, MD Admit Date: 06/27/2023  Reason for Consultation/Follow-up: Establishing goals of care  HPI/Brief Hospital Review: 83 y.o. male  with past medical history of  hypertension, hyperlipidemia, diabetes mellitus, CAD, dCHF, sarcoidosis, dementia, depression, BPH, CKD-3A, A-fib not on anticoagulants, thrombocytopenia, left kidney hydronephrosis (s/p of nephrostomy tube)  admitted on 06/27/2023 s/p fall.   As per chart review, patient fell off exam table at doctor office and sent to ER for evaluation.   Upon presentation to ED, chest x-ray was within normal limits.  CT revealed no obstructing stone, mild left hydronephrosis with proximal hydroureter, marked bladder wall thickening, bronchial wall thickening with mucous plugs in the lower lungs, and splenomegaly.   UA was positive and urine culture is pending.  Patient is being treated for UTI with IV Rocephin , AKI on CKD (stage IIIa), proximal A-fib, sarcoidosis (continue steroids and bronchodilators), and dementia.   PMT was consulted to discuss goals of care.   Subjective: Extensive chart review has been completed prior to meeting patient including labs, vital signs, imaging, progress notes, orders, and available advanced directive documents from current and previous encounters.    Visited with Peter Robertson at his bedside. He is awake, alert, sitting in recliner, he is able to converse but quite disoriented. He tells me he fell out of his chair at home but unsure who called EMS. He is unable to tell me his previous home environment (private home  versus LTC). Per chart review, discharged to [facility] 11/2022 for STR-potential transitioned to LTC.  Peter Robertson is eager to be discharged from the hospital and shares he is awaiting transport. Attempted to explain his need for remaining inpatient.  No family at bedside during time of visit. Due to underlying cognitive impairment, Peter Robertson unable to participate in GOC conversations. Attempted call to [name], no answer, HIPAA complaint VM left.  Addendum: Received a call back from [name]. She speaks to the cognitive and functional decline over the last several months. She also speaks to the poor care she feels he has received at LTC. Confirms he is a LTC resident at [facility]. [name] shares she is  aware of the the stage of Peter Robertson's condition. We discussed hospice services and the overall philosophy of hospice. [name] shares she has had experience working with [facility] hospice and is interested in having them follow Peter Robertson on his return to [facility].  TOC and [facility] HL engaged. Primary team made aware of plan of care.  PMT will continue to follow for ongoing needs and support.  Thank you for allowing the Palliative Medicine Team to assist in the care of this patient.  Total time:  50 minutes  Time spent includes: Detailed review of medical records (labs, imaging, vital signs), medically appropriate exam (mental status, respiratory, cardiac, skin), discussed with treatment team, counseling and educating patient, family and staff, documenting clinical information, medication management and coordination of care.  Waddell Lesches, DNP, AGNP-C Palliative Medicine   Please contact Palliative Medicine Team phone at [phone] for questions and concerns.

## 2023-06-29 NOTE — Plan of Care (Signed)

## 2023-06-29 NOTE — Progress Notes (Signed)
 [facility]- Advertising Account Executive with [name] and went over Hospice services at a LTC facility.    Please don't hesitate to call with any Hospice related questions or concerns.    Thank you for the opportunity to participate in this patient's care.  [facility] Liaison [phone]

## 2023-06-29 NOTE — Progress Notes (Addendum)
 PROGRESS NOTE    Peter Robertson  FMW:[id number] DOB: April 28, 1941 DOA: 06/27/2023 PCP: Derenda Rockers, MD  Assessment & Plan:   Principal Problem:   UTI (urinary tract infection) Active Problems:   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)   Hypertension   Chronic diastolic CHF (congestive heart failure) (HCC)   Acute renal failure superimposed on stage 3a chronic kidney disease (HCC)   Paroxysmal atrial fibrillation (HCC)   Type II diabetes mellitus with renal manifestations (HCC)   Sarcoidosis   Dementia without behavioral disturbance (HCC)   Hyperlipidemia   Thrombocytopenia (HCC)   BPH (benign prostatic hyperplasia)   Fall at home, initial encounter   Depression  Assessment and Plan: UTI: UA was positive. Urine shows containment and repeat cx would likely show no growth as pt has been on abxs already. Continue on IV rocephin   Generalized weakness: PT/OT recs SNF     Hx of CAD: s/p two-vessel CABG 2011. Continue on statin    HTN: continue on reduced dose of metoprolol . Holding imdur , valsartan  Chronic diastolic CHF: appears compensated. Holding lasix . Monitor I/Os   AKI on CKDIIIa: likely due to UTI.  CT scan showed mild left hydronephrosis with proximal hydroureter,  no obstructing stone, which seems to be a chronic issue. Cr is trending down daily.   PAF: continue on reduced dose of metoprolol . Not on chronic anticoagulation    DM2: HbA1c 6.8, well controlled. Continue on glargine, SSI w/ accuchecks    Hx of sarcoidosis: continue on steroids, bronchodilators    Dementia: continue on supportive care    HLD: continue on statin    Thrombocytopenia: chronic. Labile    BPH: continue on home dose of flomax    Fall: at home. PT/OT recs SNF   Depression: severity unknown. Continue on home dose of sertraline         DVT prophylaxis: heparin   Code Status: DNR  Family Communication: discussed pt's care w/ [name] and answered her questions  Disposition  Plan: likely d/c to SNF  Level of care: Telemetry Medical  Status is: Inpatient Remains inpatient appropriate because: severity of illness    Consultants:    Procedures:   Antimicrobials: rocephin    Subjective: Pt c/o malaise   Objective: Vitals:   06/29/23 0421 06/29/23 0448 06/29/23 0525 06/29/23 0753  BP: (!) 147/76 123/63 (!) 148/63 (!) 143/61  Pulse: 83 80 82 73  Resp: 19 18 18 18   Temp: 97.6 F (36.4 C) 97.7 F (36.5 C) 97.7 F (36.5 C) 98.1 F (36.7 C)  TempSrc: Oral     SpO2: 100% 95% 100% 100%  Weight: 78.6 kg     Height: 5' 6 (1.676 m)       Intake/Output Summary (Last 24 hours) at 06/29/2023 0807 Last data filed at 06/28/2023 2226 Gross per 24 hour  Intake 100 ml  Output --  Net 100 ml   Filed Weights   06/27/23 1505 06/29/23 0421  Weight: 81.6 kg 78.6 kg    Examination:  General exam: Appears comfortable  Respiratory system: clear breath sounds b/l  Cardiovascular system: S1/S2+. No rubs or clicks   Gastrointestinal system: Abd is soft, NT, ND & hypoactive bowel sounds. Central nervous system: alert & awake. Moves all extremities  Psychiatry: Judgement and insight appears at baseline. Appropriate mood and affect    Data Reviewed: I have personally reviewed following labs and imaging studies  CBC: Recent Labs  Lab 06/27/23 1508 06/28/23 0253 06/29/23 0303  WBC 9.0 5.6 4.4  HGB 8.9* 7.9* 8.9*  HCT 27.2* 23.2* 27.1*  MCV 89.2 86.9 89.4  PLT 113* 91* 86*   Basic Metabolic Panel: Recent Labs  Lab 06/27/23 1508 06/28/23 0253 06/29/23 0303  NA 133* 136 134*  K 4.8 4.4 4.8  CL 102 106 104  CO2 18* 19* 16*  GLUCOSE 207* 147* 295*  BUN 95* 95* 88*  CREATININE 3.72* 3.45* 2.90*  CALCIUM  8.2* 7.9* 8.2*   GFR: Estimated Creatinine Clearance: 19 mL/min (A) (by C-G formula based on SCr of 2.9 mg/dL (H)). Liver Function Tests: Recent Labs  Lab 06/27/23 1508  AST 12*  ALT 15  ALKPHOS 60  BILITOT 0.7  PROT 7.0  ALBUMIN  3.6    No results for input(s): LIPASE, AMYLASE in the last 168 hours. No results for input(s): AMMONIA in the last 168 hours. Coagulation Profile: No results for input(s): INR, PROTIME in the last 168 hours. Cardiac Enzymes: No results for input(s): CKTOTAL, CKMB, CKMBINDEX, TROPONINI in the last 168 hours. BNP (last 3 results) No results for input(s): PROBNP in the last 8760 hours. HbA1C: No results for input(s): HGBA1C in the last 72 hours. CBG: Recent Labs  Lab 06/28/23 0830 06/28/23 1157 06/28/23 1633 06/28/23 2059 06/29/23 0756  GLUCAP 88 159* 263* 366* 297*   Lipid Profile: No results for input(s): CHOL, HDL, LDLCALC, TRIG, CHOLHDL, LDLDIRECT in the last 72 hours. Thyroid Function Tests: No results for input(s): TSH, T4TOTAL, FREET4, T3FREE, THYROIDAB in the last 72 hours. Anemia Panel: No results for input(s): VITAMINB12, FOLATE, FERRITIN, TIBC, IRON, RETICCTPCT in the last 72 hours. Sepsis Labs: Recent Labs  Lab 06/27/23 2216  LATICACIDVEN 0.7    Recent Results (from the past 240 hours)  Resp panel by RT-PCR (RSV, Flu A&B, Covid) Anterior Nasal Swab     Status: None   Collection Time: 06/27/23  3:09 PM   Specimen: Anterior Nasal Swab  Result Value Ref Range Status   SARS Coronavirus 2 by RT PCR NEGATIVE NEGATIVE Final    Comment: (NOTE) SARS-CoV-2 target nucleic acids are NOT DETECTED.  The SARS-CoV-2 RNA is generally detectable in upper respiratory specimens during the acute phase of infection. The lowest concentration of SARS-CoV-2 viral copies this assay can detect is 138 copies/mL. A negative result does not preclude SARS-Cov-2 infection and should not be used as the sole basis for treatment or other patient management decisions. A negative result may occur with  improper specimen collection/handling, submission of specimen other than nasopharyngeal swab, presence of viral mutation(s) within the areas  targeted by this assay, and inadequate number of viral copies(<138 copies/mL). A negative result must be combined with clinical observations, patient history, and epidemiological information. The expected result is Negative.  Fact Sheet for Patients:  bloggercourse.com  Fact Sheet for Healthcare Providers:  seriousbroker.it  This test is no t yet approved or cleared by the [place]  FDA and  has been authorized for detection and/or diagnosis of SARS-CoV-2 by FDA under an Emergency Use Authorization (EUA). This EUA will remain  in effect (meaning this test can be used) for the duration of the COVID-19 declaration under Section 564(b)(1) of the Act, 21 U.S.C.section 360bbb-3(b)(1), unless the authorization is terminated  or revoked sooner.       Influenza A by PCR NEGATIVE NEGATIVE Final   Influenza B by PCR NEGATIVE NEGATIVE Final    Comment: (NOTE) The Xpert Xpress SARS-CoV-2/FLU/RSV plus assay is intended as an aid in the diagnosis of influenza from Nasopharyngeal swab specimens and should  not be used as a sole basis for treatment. Nasal washings and aspirates are unacceptable for Xpert Xpress SARS-CoV-2/FLU/RSV testing.  Fact Sheet for Patients: bloggercourse.com  Fact Sheet for Healthcare Providers: seriousbroker.it  This test is not yet approved or cleared by the [place]  FDA and has been authorized for detection and/or diagnosis of SARS-CoV-2 by FDA under an Emergency Use Authorization (EUA). This EUA will remain in effect (meaning this test can be used) for the duration of the COVID-19 declaration under Section 564(b)(1) of the Act, 21 U.S.C. section 360bbb-3(b)(1), unless the authorization is terminated or revoked.     Resp Syncytial Virus by PCR NEGATIVE NEGATIVE Final    Comment: (NOTE) Fact Sheet for  Patients: bloggercourse.com  Fact Sheet for Healthcare Providers: seriousbroker.it  This test is not yet approved or cleared by the [place]  FDA and has been authorized for detection and/or diagnosis of SARS-CoV-2 by FDA under an Emergency Use Authorization (EUA). This EUA will remain in effect (meaning this test can be used) for the duration of the COVID-19 declaration under Section 564(b)(1) of the Act, 21 U.S.C. section 360bbb-3(b)(1), unless the authorization is terminated or revoked.  Performed at [facility], [address] [place], [place] [address]   Blood culture (routine x 2)     Status: None (Preliminary result)   Collection Time: 06/27/23  7:56 PM   Specimen: BLOOD  Result Value Ref Range Status   Specimen Description BLOOD RIGHT ARM  Final   Special Requests   Final    BOTTLES DRAWN AEROBIC AND ANAEROBIC Blood Culture results may not be optimal due to an inadequate volume of blood received in culture bottles   Culture   Final    NO GROWTH 2 DAYS Performed at [facility], [address] [place], [place] [address]    Report Status PENDING  Incomplete  Blood culture (routine x 2)     Status: None (Preliminary result)   Collection Time: 06/27/23  7:56 PM   Specimen: BLOOD  Result Value Ref Range Status   Specimen Description BLOOD LEFT ARM  Final   Special Requests   Final    BOTTLES DRAWN AEROBIC AND ANAEROBIC Blood Culture results may not be optimal due to an inadequate volume of blood received in culture bottles   Culture   Final    NO GROWTH 2 DAYS Performed at [facility], [address] [place], [place] [address]    Report Status PENDING  Incomplete         Radiology Studies: CT Renal Stone Study Result Date: 06/27/2023 CLINICAL DATA:  Abdominal/flank pain, stone suspected, evaluation for obstruction. Fell off the bed at the doctor's office. EXAM: CT ABDOMEN AND  PELVIS WITHOUT CONTRAST TECHNIQUE: Multidetector CT imaging of the abdomen and pelvis was performed following the standard protocol without IV contrast. RADIATION DOSE REDUCTION: This exam was performed according to the departmental dose-optimization program which includes automated exposure control, adjustment of the mA and/or kV according to patient size and/or use of iterative reconstruction technique. COMPARISON:  11/18/2022 FINDINGS: Lower chest: Calcified pleural plaques. Bronchial wall thickening and mucous plugging. Question right hilar adenopathy. Hepatobiliary: No acute abnormality. Pancreas: Coarse calcifications in the pancreatic head. No acute inflammation or ductal dilation. Spleen: Similar splenomegaly. Adrenals/Urinary Tract: Unremarkable adrenal glands. Mild left hydronephrosis with proximal hydroureter. No obstructing stone. Marked bladder wall thickening. Stomach/Bowel: Normal caliber large and small bowel. No bowel wall thickening moderate colonic stool load. Stomach and appendix are within normal  limits. Vascular/Lymphatic: Aortic atherosclerosis. No enlarged abdominal or pelvic lymph nodes. Reproductive: Enlarged prostate. Other: No free intraperitoneal fluid or air. Musculoskeletal: No acute fracture. Chronic compression fracture of L1. IMPRESSION: 1. Mild left hydronephrosis with proximal hydroureter. No obstructing stone. 2. Marked bladder wall thickening, increased from prior. Differential considerations include obstructive uropathy from enlarged prostate, cystitis, or infiltrative malignancy. 3. Bronchial wall thickening and mucous plugs in the lower lungs. 4. Splenomegaly. Aortic Atherosclerosis (ICD10-I70.0). Electronically Signed   By: Norman Gatlin M.D.   On: 06/27/2023 20:27   DG Chest 2 View Result Date: 06/27/2023 CLINICAL DATA:  Wheezing.  Sarcoidosis. EXAM: CHEST - 2 VIEW COMPARISON:  Chest radiograph dated 11/18/2022. FINDINGS: Bilateral chronic calcified plaques in keeping  with known pulmonary sarcoidosis. No new consolidation. There is no pleural effusion pneumothorax. Stable cardiomediastinal silhouette. Median sternotomy wires. No acute osseous pathology. IMPRESSION: 1. No acute cardiopulmonary process. 2. 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 Electronically Signed   By: Vanetta Chou M.D.   On: 06/27/2023 16:54   CT Head Wo Contrast Result Date: 06/27/2023 CLINICAL DATA:  Provided history: Head trauma, minor.  Fall. EXAM: CT HEAD WITHOUT CONTRAST CT CERVICAL SPINE WITHOUT CONTRAST TECHNIQUE: Multidetector CT imaging of the head and cervical spine was performed following the standard protocol without intravenous contrast. Multiplanar CT image reconstructions of the cervical spine were also generated. RADIATION DOSE REDUCTION: This exam was performed according to the departmental dose-optimization program which includes automated exposure control, adjustment of the mA and/or kV according to patient size and/or use of iterative reconstruction technique. COMPARISON:  Head CT 05/05/2021. Cervical spine CT 05/05/2021. Chest CT 08/27/2019. Thyroid ultrasound 05/16/2021. FINDINGS: CT HEAD FINDINGS Brain: Generalized cerebral atrophy. Commensurate prominence of the ventricles and sulci. 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 This is new from the prior head CT of 05/05/2021. Mild patchy and ill-defined hypoattenuation elsewhere within the cerebral white matter, nonspecific but compatible chronic small vessel ischemic disease. There is no acute intracranial hemorrhage. No demarcated cortical infarct. No extra-axial fluid collection. No evidence of an intracranial mass. No midline shift. Vascular: No hyperdense vessel.  Atherosclerotic calcifications. Skull: No calvarial fracture or aggressive osseous lesion. Apparent small burr hole within the right frontal calvarium. Sinuses/Orbits: No mass or acute  finding within the imaged orbits. Mucosal thickening within the bilateral frontal sinuses (mild right, moderate left). Bilateral ethmoid sinusitis (moderate right, severe left). Mucosal thickening within the bilateral sphenoid sinuses (mild right, moderate left). CT CERVICAL SPINE FINDINGS Alignment: Mild levocurvature of the cervical spine. Nonspecific reversal of the expected cervical lordosis. 2 mm grade 1 anterolisthesis at C2-C3 and C3-C4. 3 mm grade 1 anterolisthesis at C4-C5. Skull base and vertebrae: The basion-dental and atlanto-dental intervals are maintained.No evidence of acute fracture to the cervical spine. Soft tissues and spinal canal: No prevertebral fluid or swelling. No visible canal hematoma. Disc levels: Cervical spondylosis with multilevel disc space narrowing, disc bulges/central disc protrusions, posterior disc osteophyte complexes, uncovertebral hypertrophy and facet arthrosis. Disc space narrowing is greatest at C5-C6 and C6-C7 (advanced at these levels). Multilevel spinal canal narrowing. Most notably, disc protrusions contribute to apparent moderate spinal canal stenosis at C2-C3 and C3-C4, and a posterior disc osteophyte complex contributes to apparent moderate spinal canal stenosis at C6-C7. Multilevel bony neural foraminal narrowing. Upper chest: No acute finding. Prior median sternotomy. Chronic nodule/foci of scarring (with associated calcifications) again demonstrated within imaged portions of the lung apices. Per prior radiology records, the  patient has a history of sarcoidosis. Other: Known 2.2 cm right thyroid lobe nodule. This nodule was previously assessed with thyroid ultrasound on 05/16/2021. Please refer to this prior examination for further description and for recommendations. IMPRESSION: CT head: 1. No evidence of an acute intracranial abnormality. 2. Parenchymal atrophy and chronic small vessel ischemic disease, as described. 3. Paranasal sinus disease at the imaged  levels as outlined. CT cervical spine: 1. No evidence of an acute cervical spine fracture. 2. Nonspecific reversal of the expected cervical lordosis. 3. Mild Levocurvature of the cervical spine. 4. Grade 1 anterolisthesis at C2-C3, C3-C4 and C4-C5. 5. Cervical spondylosis as described. Electronically Signed   By: Rockey Childs D.O.   On: 06/27/2023 16:03   CT Cervical Spine Wo Contrast Result Date: 06/27/2023 CLINICAL DATA:  Provided history: Head trauma, minor.  Fall. EXAM: CT HEAD WITHOUT CONTRAST CT CERVICAL SPINE WITHOUT CONTRAST TECHNIQUE: Multidetector CT imaging of the head and cervical spine was performed following the standard protocol without intravenous contrast. Multiplanar CT image reconstructions of the cervical spine were also generated. RADIATION DOSE REDUCTION: This exam was performed according to the departmental dose-optimization program which includes automated exposure control, adjustment of the mA and/or kV according to patient size and/or use of iterative reconstruction technique. COMPARISON:  Head CT 05/05/2021. Cervical spine CT 05/05/2021. Chest CT 08/27/2019. Thyroid ultrasound 05/16/2021. FINDINGS: CT HEAD FINDINGS Brain: Generalized cerebral atrophy. Commensurate prominence of the ventricles and sulci. 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 This is new from the prior head CT of 05/05/2021. Mild patchy and ill-defined hypoattenuation elsewhere within the cerebral white matter, nonspecific but compatible chronic small vessel ischemic disease. There is no acute intracranial hemorrhage. No demarcated cortical infarct. No extra-axial fluid collection. No evidence of an intracranial mass. No midline shift. Vascular: No hyperdense vessel.  Atherosclerotic calcifications. Skull: No calvarial fracture or aggressive osseous lesion. Apparent small burr hole within the right frontal calvarium. Sinuses/Orbits: No mass or acute finding within the  imaged orbits. Mucosal thickening within the bilateral frontal sinuses (mild right, moderate left). Bilateral ethmoid sinusitis (moderate right, severe left). Mucosal thickening within the bilateral sphenoid sinuses (mild right, moderate left). CT CERVICAL SPINE FINDINGS Alignment: Mild levocurvature of the cervical spine. Nonspecific reversal of the expected cervical lordosis. 2 mm grade 1 anterolisthesis at C2-C3 and C3-C4. 3 mm grade 1 anterolisthesis at C4-C5. Skull base and vertebrae: The basion-dental and atlanto-dental intervals are maintained.No evidence of acute fracture to the cervical spine. Soft tissues and spinal canal: No prevertebral fluid or swelling. No visible canal hematoma. Disc levels: Cervical spondylosis with multilevel disc space narrowing, disc bulges/central disc protrusions, posterior disc osteophyte complexes, uncovertebral hypertrophy and facet arthrosis. Disc space narrowing is greatest at C5-C6 and C6-C7 (advanced at these levels). Multilevel spinal canal narrowing. Most notably, disc protrusions contribute to apparent moderate spinal canal stenosis at C2-C3 and C3-C4, and a posterior disc osteophyte complex contributes to apparent moderate spinal canal stenosis at C6-C7. Multilevel bony neural foraminal narrowing. Upper chest: No acute finding. Prior median sternotomy. Chronic nodule/foci of scarring (with associated calcifications) again demonstrated within imaged portions of the lung apices. Per prior radiology records, the patient has a history of sarcoidosis. Other: Known 2.2 cm right thyroid lobe nodule. This nodule was previously assessed with thyroid ultrasound on 05/16/2021. Please refer to this prior examination for further description and for recommendations. IMPRESSION: CT head: 1. No evidence of an acute intracranial abnormality. 2. Parenchymal atrophy and chronic small  vessel ischemic disease, as described. 3. Paranasal sinus disease at the imaged levels as outlined. CT  cervical spine: 1. No evidence of an acute cervical spine fracture. 2. Nonspecific reversal of the expected cervical lordosis. 3. Mild Levocurvature of the cervical spine. 4. Grade 1 anterolisthesis at C2-C3, C3-C4 and C4-C5. 5. Cervical spondylosis as described. Electronically Signed   By: Rockey Childs D.O.   On: 06/27/2023 16:03        Scheduled Meds:  atorvastatin   20 mg Oral QAC supper   heparin   5,000 Units Subcutaneous Q8H   insulin  aspart  0-5 Units Subcutaneous QHS   insulin  aspart  0-9 Units Subcutaneous TID WC   insulin  glargine-yfgn  15 Units Subcutaneous Daily   ipratropium-albuterol   3 mL Nebulization Q6H   methylPREDNISolone  (SOLU-MEDROL ) injection  40 mg Intravenous Q12H   metoprolol  succinate  50 mg Oral Daily   pantoprazole   40 mg Oral Daily   sertraline   50 mg Oral Daily   tamsulosin   0.8 mg Oral QHS   Continuous Infusions:  cefTRIAXone  (ROCEPHIN )  IV Stopped (06/28/23 2226)     LOS: 2 days        Anthony CHRISTELLA Pouch, MD [facility] Pager [phone]  If 7PM-7AM, please contact night-coverage [email or web address] 06/29/2023, 8:07 AM

## 2023-06-29 NOTE — Inpatient Diabetes Management (Signed)
 Inpatient Diabetes Program Recommendations  AACE/ADA: New Consensus Statement on Inpatient Glycemic Control   Target Ranges:  Prepandial:   less than 140 mg/dL      Peak postprandial:   less than 180 mg/dL (1-2 hours)      Critically ill patients:  140 - 180 mg/dL    Latest Reference Range & Units 06/28/23 08:30 06/28/23 11:57 06/28/23 16:33 06/28/23 20:59 06/29/23 07:56  Glucose-Capillary 70 - 99 mg/dL 88 840 (H) 736 (H) 633 (H) 297 (H)   Review of Glycemic Control  Diabetes history: DM2 Outpatient Diabetes medications: Lantus  30 units daily, Jardiance 10 mg daily Current orders for Inpatient glycemic control: Semglee  15 units daily, Novolog  0-9 units TID with meals, Novolog  0-5 units at bedtime; Solumedrol 40 mg Q12H  Inpatient Diabetes Program Recommendations:    Insulin : If steroids are continued as ordered, please consider increasing Semglee  to 20 units daily and ordering Novolog  4 units TID with meals for meal coverage if patient eats at least 50% of meals.  Outpatient DM meds: Patient admitted with UTI. Due to risk of UTI with Jardiance, may want to consider stopping Jardiance at discharge.  Thanks, Earnie Gainer, RN, MSN, CDCES Diabetes Coordinator Inpatient Diabetes Program [phone] (Team Pager from 8am to 5pm)

## 2023-06-29 NOTE — ED Notes (Signed)
 ED TO INPATIENT HANDOFF REPORT  ED Nurse Name and Phone #: Ronnald RAMAN Name/Age/Gender Peter Robertson Gilding 83 y.o. male Room/Bed: ED17A/ED17A  Code Status   Code Status: Limited: Do not attempt resuscitation (DNR) -DNR-LIMITED -Do Not Intubate/DNI   Home/SNF/Other Nursing Home Patient oriented to: self and place Is this baseline? Yes   Triage Complete: Triage complete  Chief Complaint UTI (urinary tract infection) [N39.0]  Triage Note Pt sts that he fell off the bed at the dr's office. Pt sts that he did not hit his head. Pt has no complaints at this time. Pt has audible wheezing at this time. Pt sts that has been going on for a long time and no one checks it out. Pt showed no concern about his breathing.   Allergies Allergies  Allergen Reactions   Sulfamethoxazole-Trimethoprim Other (See Comments)    dizziness dizziness dizziness    Tape Rash    blisters    Level of Care/Admitting Diagnosis ED Disposition     ED Disposition  Admit   Condition  --   Comment  Hospital Area: [facility] [[id number]]  Level of Care: Telemetry Medical [[id number]]  Covid Evaluation: Confirmed COVID Negative  Diagnosis: UTI (urinary tract infection) [[id number]]  Admitting Physician: NIU, XILIN [[id number]]  Attending Physician: NIU, XILIN [phone]  Certification:: I certify this patient will need inpatient services for at least 2 midnights  Expected Medical Readiness: 06/29/2023          B Medical/Surgery History Past Medical History:  Diagnosis Date   Asthma    BPH (benign prostatic hyperplasia)    Chronic kidney disease    Kidney Stones   Coronary artery disease    Diabetes mellitus without complication (HCC)    Heart murmur    Hyperlipidemia    Hypertension    Myocardial infarction [facility])    Past Surgical History:  Procedure Laterality Date   COLONOSCOPY WITH PROPOFOL  N/A 12/03/2014   Procedure: COLONOSCOPY WITH PROPOFOL ;  Surgeon: Lamar ONEIDA Holmes, MD;  Location: [facility]  ENDOSCOPY;  Service: Endoscopy;  Laterality: N/A;   CORONARY ARTERY BYPASS GRAFT     LEFT HEART CATH AND CORS/GRAFTS ANGIOGRAPHY N/A 05/22/2018   Procedure: LEFT HEART CATH AND CORS/GRAFTS ANGIOGRAPHY poss PCI;  Surgeon: Perla Evalene PARAS, MD;  Location: [facility] INVASIVE CV LAB;  Service: Cardiovascular;  Laterality: N/A;     A IV Location/Drains/Wounds Patient Lines/Drains/Airways Status     Active Line/Drains/Airways     Name Placement date Placement time Site Days   Peripheral IV 06/27/23 20 G 1 Left Antecubital 06/27/23  1837  Antecubital  2   External Urinary Catheter 06/28/23  2105  --  1   Pressure Injury 11/19/22 Coccyx Anterior Stage 2 -  Partial thickness loss of dermis presenting as a shallow open injury with a red, pink wound bed without slough. 11/19/22  1700  -- 222            Intake/Output Last 24 hours  Intake/Output Summary (Last 24 hours) at 06/29/2023 0352 Last data filed at 06/28/2023 2226 Gross per 24 hour  Intake 100 ml  Output --  Net 100 ml    Labs/Imaging Results for orders placed or performed during the hospital encounter of 06/27/23 (from the past 48 hours)  CBC     Status: Abnormal   Collection Time: 06/27/23  3:08 PM  Result Value Ref Range   WBC 9.0 4.0 - 10.5 K/uL   RBC 3.05 (L) 4.22 - 5.81 MIL/uL  Hemoglobin 8.9 (L) 13.0 - 17.0 g/dL   HCT 72.7 (L) 60.9 - 47.9 %   MCV 89.2 80.0 - 100.0 fL   MCH 29.2 26.0 - 34.0 pg   MCHC 32.7 30.0 - 36.0 g/dL   RDW 83.9 (H) 88.4 - 84.4 %   Platelets 113 (L) 150 - 400 K/uL   nRBC 0.0 0.0 - 0.2 %    Comment: Performed at [facility], [address] [place], [place] [address]  Comprehensive metabolic panel     Status: Abnormal   Collection Time: 06/27/23  3:08 PM  Result Value Ref Range   Sodium 133 (L) 135 - 145 mmol/L   Potassium 4.8 3.5 - 5.1 mmol/L   Chloride 102 98 - 111 mmol/L   CO2 18 (L) 22 - 32 mmol/L   Glucose, Bld 207 (H) 70 - 99 mg/dL    Comment: Glucose reference range applies  only to samples taken after fasting for at least 8 hours.   BUN 95 (H) 8 - 23 mg/dL   Creatinine, Ser 6.27 (H) 0.61 - 1.24 mg/dL   Calcium  8.2 (L) 8.9 - 10.3 mg/dL   Total Protein 7.0 6.5 - 8.1 g/dL   Albumin  3.6 3.5 - 5.0 g/dL   AST 12 (L) 15 - 41 U/L   ALT 15 0 - 44 U/L   Alkaline Phosphatase 60 38 - 126 U/L   Total Bilirubin 0.7 0.0 - 1.2 mg/dL   GFR, Estimated 15 (L) >60 mL/min    Comment: (NOTE) Calculated using the CKD-EPI Creatinine Equation (2021)    Anion gap 13 5 - 15    Comment: Performed at [facility], [address] [place], [place] [address]  Brain natriuretic peptide     Status: Abnormal   Collection Time: 06/27/23  3:08 PM  Result Value Ref Range   B Natriuretic Peptide 124.4 (H) 0.0 - 100.0 pg/mL    Comment: Performed at [facility], [address] [place], [place] [address]  Resp panel by RT-PCR (RSV, Flu A&B, Covid) Anterior Nasal Swab     Status: None   Collection Time: 06/27/23  3:09 PM   Specimen: Anterior Nasal Swab  Result Value Ref Range   SARS Coronavirus 2 by RT PCR NEGATIVE NEGATIVE    Comment: (NOTE) SARS-CoV-2 target nucleic acids are NOT DETECTED.  The SARS-CoV-2 RNA is generally detectable in upper respiratory specimens during the acute phase of infection. The lowest concentration of SARS-CoV-2 viral copies this assay can detect is 138 copies/mL. A negative result does not preclude SARS-Cov-2 infection and should not be used as the sole basis for treatment or other patient management decisions. A negative result may occur with  improper specimen collection/handling, submission of specimen other than nasopharyngeal swab, presence of viral mutation(s) within the areas targeted by this assay, and inadequate number of viral copies(<138 copies/mL). A negative result must be combined with clinical observations, patient history, and epidemiological information. The expected result is Negative.  Fact Sheet for Patients:   bloggercourse.com  Fact Sheet for Healthcare Providers:  seriousbroker.it  This test is no t yet approved or cleared by the [place]  FDA and  has been authorized for detection and/or diagnosis of SARS-CoV-2 by FDA under an Emergency Use Authorization (EUA). This EUA will remain  in effect (meaning this test can be used) for the duration of the COVID-19 declaration under Section 564(b)(1) of the Act, 21 U.S.C.section 360bbb-3(b)(1), unless the authorization is terminated  or revoked sooner.  Influenza A by PCR NEGATIVE NEGATIVE   Influenza B by PCR NEGATIVE NEGATIVE    Comment: (NOTE) The Xpert Xpress SARS-CoV-2/FLU/RSV plus assay is intended as an aid in the diagnosis of influenza from Nasopharyngeal swab specimens and should not be used as a sole basis for treatment. Nasal washings and aspirates are unacceptable for Xpert Xpress SARS-CoV-2/FLU/RSV testing.  Fact Sheet for Patients: bloggercourse.com  Fact Sheet for Healthcare Providers: seriousbroker.it  This test is not yet approved or cleared by the [place]  FDA and has been authorized for detection and/or diagnosis of SARS-CoV-2 by FDA under an Emergency Use Authorization (EUA). This EUA will remain in effect (meaning this test can be used) for the duration of the COVID-19 declaration under Section 564(b)(1) of the Act, 21 U.S.C. section 360bbb-3(b)(1), unless the authorization is terminated or revoked.     Resp Syncytial Virus by PCR NEGATIVE NEGATIVE    Comment: (NOTE) Fact Sheet for Patients: bloggercourse.com  Fact Sheet for Healthcare Providers: seriousbroker.it  This test is not yet approved or cleared by the [place]  FDA and has been authorized for detection and/or diagnosis of SARS-CoV-2 by FDA under an Emergency Use Authorization (EUA).  This EUA will remain in effect (meaning this test can be used) for the duration of the COVID-19 declaration under Section 564(b)(1) of the Act, 21 U.S.C. section 360bbb-3(b)(1), unless the authorization is terminated or revoked.  Performed at [facility], [address] [place], [place] [address]   Urinalysis, Routine w reflex microscopic -Urine, Clean Catch     Status: Abnormal   Collection Time: 06/27/23  6:54 PM  Result Value Ref Range   Color, Urine YELLOW (A) YELLOW   APPearance TURBID (A) CLEAR   Specific Gravity, Urine 1.014 1.005 - 1.030   pH 5.0 5.0 - 8.0   Glucose, UA 50 (A) NEGATIVE mg/dL   Hgb urine dipstick MODERATE (A) NEGATIVE   Bilirubin Urine NEGATIVE NEGATIVE   Ketones, ur NEGATIVE NEGATIVE mg/dL   Protein, ur 899 (A) NEGATIVE mg/dL   Nitrite NEGATIVE NEGATIVE   Leukocytes,Ua MODERATE (A) NEGATIVE   RBC / HPF >50 0 - 5 RBC/hpf   WBC, UA >50 0 - 5 WBC/hpf   Bacteria, UA MANY (A) NONE SEEN   Squamous Epithelial / HPF 0 0 - 5 /HPF   WBC Clumps PRESENT    Non Squamous Epithelial PRESENT (A) NONE SEEN    Comment: Performed at [facility], [address] [place], [place] [address]  Blood culture (routine x 2)     Status: None (Preliminary result)   Collection Time: 06/27/23  7:56 PM   Specimen: BLOOD  Result Value Ref Range   Specimen Description BLOOD RIGHT ARM    Special Requests      BOTTLES DRAWN AEROBIC AND ANAEROBIC Blood Culture results may not be optimal due to an inadequate volume of blood received in culture bottles   Culture      NO GROWTH < 12 HOURS Performed at [facility], [address] [place], [place] [address]    Report Status PENDING   Blood culture (routine x 2)     Status: None (Preliminary result)   Collection Time: 06/27/23  7:56 PM   Specimen: BLOOD  Result Value Ref Range   Specimen Description BLOOD LEFT ARM    Special Requests      BOTTLES DRAWN AEROBIC AND ANAEROBIC Blood Culture results may  not be optimal due to an inadequate volume of blood received in  culture bottles   Culture      NO GROWTH < 12 HOURS Performed at [facility], [address] [place], [place] [address]    Report Status PENDING   Lactic acid, plasma     Status: None   Collection Time: 06/27/23 10:16 PM  Result Value Ref Range   Lactic Acid, Venous 0.7 0.5 - 1.9 mmol/L    Comment: Performed at [facility], [address] [place], [place] [address]  CBG monitoring, ED     Status: Abnormal   Collection Time: 06/27/23 11:06 PM  Result Value Ref Range   Glucose-Capillary 207 (H) 70 - 99 mg/dL    Comment: Glucose reference range applies only to samples taken after fasting for at least 8 hours.  Basic metabolic panel     Status: Abnormal   Collection Time: 06/28/23  2:53 AM  Result Value Ref Range   Sodium 136 135 - 145 mmol/L   Potassium 4.4 3.5 - 5.1 mmol/L   Chloride 106 98 - 111 mmol/L   CO2 19 (L) 22 - 32 mmol/L   Glucose, Bld 147 (H) 70 - 99 mg/dL    Comment: Glucose reference range applies only to samples taken after fasting for at least 8 hours.   BUN 95 (H) 8 - 23 mg/dL   Creatinine, Ser 6.54 (H) 0.61 - 1.24 mg/dL   Calcium  7.9 (L) 8.9 - 10.3 mg/dL   GFR, Estimated 17 (L) >60 mL/min    Comment: (NOTE) Calculated using the CKD-EPI Creatinine Equation (2021)    Anion gap 11 5 - 15    Comment: Performed at [facility] [address] [place], [place] [address]  CBC     Status: Abnormal   Collection Time: 06/28/23  2:53 AM  Result Value Ref Range   WBC 5.6 4.0 - 10.5 K/uL   RBC 2.67 (L) 4.22 - 5.81 MIL/uL   Hemoglobin 7.9 (L) 13.0 - 17.0 g/dL   HCT 76.7 (L) 60.9 - 47.9 %   MCV 86.9 80.0 - 100.0 fL   MCH 29.6 26.0 - 34.0 pg   MCHC 34.1 30.0 - 36.0 g/dL   RDW 84.0 (H) 88.4 - 84.4 %   Platelets 91 (L) 150 - 400 K/uL    Comment: Immature Platelet Fraction may be clinically indicated, consider ordering this additional test [id number] REPEATED TO VERIFY     nRBC 0.0 0.0 - 0.2 %    Comment: Performed at [facility], [address] [place], [place] [address]  CBG monitoring, ED     Status: None   Collection Time: 06/28/23  8:30 AM  Result Value Ref Range   Glucose-Capillary 88 70 - 99 mg/dL    Comment: Glucose reference range applies only to samples taken after fasting for at least 8 hours.  CBG monitoring, ED     Status: Abnormal   Collection Time: 06/28/23 11:57 AM  Result Value Ref Range   Glucose-Capillary 159 (H) 70 - 99 mg/dL    Comment: Glucose reference range applies only to samples taken after fasting for at least 8 hours.  CBG monitoring, ED     Status: Abnormal   Collection Time: 06/28/23  4:33 PM  Result Value Ref Range   Glucose-Capillary 263 (H) 70 - 99 mg/dL    Comment: Glucose reference range applies only to samples taken after fasting for at least 8 hours.  CBG monitoring, ED     Status: Abnormal   Collection Time: 06/28/23  8:59 PM  Result Value Ref Range   Glucose-Capillary 366 (H) 70 - 99 mg/dL    Comment: Glucose reference range applies only to samples taken after fasting for at least 8 hours.  CBC     Status: Abnormal   Collection Time: 06/29/23  3:03 AM  Result Value Ref Range   WBC 4.4 4.0 - 10.5 K/uL   RBC 3.03 (L) 4.22 - 5.81 MIL/uL   Hemoglobin 8.9 (L) 13.0 - 17.0 g/dL   HCT 72.8 (L) 60.9 - 47.9 %   MCV 89.4 80.0 - 100.0 fL   MCH 29.4 26.0 - 34.0 pg   MCHC 32.8 30.0 - 36.0 g/dL   RDW 84.7 88.4 - 84.4 %   Platelets 86 (L) 150 - 400 K/uL    Comment: Immature Platelet Fraction may be clinically indicated, consider ordering this additional test [id number]    nRBC 0.0 0.0 - 0.2 %    Comment: Performed at [facility], [address] [place], [place] [address]  Basic metabolic panel     Status: Abnormal   Collection Time: 06/29/23  3:03 AM  Result Value Ref Range   Sodium 134 (L) 135 - 145 mmol/L   Potassium 4.8 3.5 - 5.1 mmol/L   Chloride 104 98 - 111 mmol/L   CO2 16 (L) 22 - 32  mmol/L   Glucose, Bld 295 (H) 70 - 99 mg/dL    Comment: Glucose reference range applies only to samples taken after fasting for at least 8 hours.   BUN 88 (H) 8 - 23 mg/dL   Creatinine, Ser 7.09 (H) 0.61 - 1.24 mg/dL   Calcium  8.2 (L) 8.9 - 10.3 mg/dL   GFR, Estimated 21 (L) >60 mL/min    Comment: (NOTE) Calculated using the CKD-EPI Creatinine Equation (2021)    Anion gap 14 5 - 15    Comment: Performed at [facility], [address], [place], [place] [address]   CT Renal [place] Study Result Date: 06/27/2023 CLINICAL DATA:  Abdominal/flank pain, stone suspected, evaluation for obstruction. Fell off the bed at the doctor's office. EXAM: CT ABDOMEN AND PELVIS WITHOUT CONTRAST TECHNIQUE: Multidetector CT imaging of the abdomen and pelvis was performed following the standard protocol without IV contrast. RADIATION DOSE REDUCTION: This exam was performed according to the departmental dose-optimization program which includes automated exposure control, adjustment of the mA and/or kV according to patient size and/or use of iterative reconstruction technique. COMPARISON:  11/18/2022 FINDINGS: Lower chest: Calcified pleural plaques. Bronchial wall thickening and mucous plugging. Question right hilar adenopathy. Hepatobiliary: No acute abnormality. Pancreas: Coarse calcifications in the pancreatic head. No acute inflammation or ductal dilation. Spleen: Similar splenomegaly. Adrenals/Urinary Tract: Unremarkable adrenal glands. Mild left hydronephrosis with proximal hydroureter. No obstructing stone. Marked bladder wall thickening. Stomach/Bowel: Normal caliber large and small bowel. No bowel wall thickening moderate colonic stool load. Stomach and appendix are within normal limits. Vascular/Lymphatic: Aortic atherosclerosis. No enlarged abdominal or pelvic lymph nodes. Reproductive: Enlarged prostate. Other: No free intraperitoneal fluid or air. Musculoskeletal: No acute fracture. Chronic compression  fracture of L1. IMPRESSION: 1. Mild left hydronephrosis with proximal hydroureter. No obstructing stone. 2. Marked bladder wall thickening, increased from prior. Differential considerations include obstructive uropathy from enlarged prostate, cystitis, or infiltrative malignancy. 3. Bronchial wall thickening and mucous plugs in the lower lungs. 4. Splenomegaly. Aortic Atherosclerosis (ICD10-I70.0). Electronically Signed   By: Norman Gatlin M.D.   On: 06/27/2023 20:27   DG Chest 2 View Result Date: 06/27/2023 CLINICAL  DATA:  Wheezing.  Sarcoidosis. EXAM: CHEST - 2 VIEW COMPARISON:  Chest radiograph dated 11/18/2022. 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 No new consolidation. There is no pleural effusion pneumothorax. Stable cardiomediastinal silhouette. Median sternotomy wires. No acute osseous pathology. IMPRESSION: 1. No acute cardiopulmonary process. 2. 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 Electronically Signed   By: Vanetta Chou M.D.   On: 06/27/2023 16:54   CT Head Wo Contrast Result Date: 06/27/2023 CLINICAL DATA:  Provided history: Head trauma, minor.  Fall. EXAM: CT HEAD WITHOUT CONTRAST CT CERVICAL SPINE WITHOUT CONTRAST TECHNIQUE: Multidetector CT imaging of the head and cervical spine was performed following the standard protocol without intravenous contrast. Multiplanar CT image reconstructions of the cervical spine were also generated. RADIATION DOSE REDUCTION: This exam was performed according to the departmental dose-optimization program which includes automated exposure control, adjustment of the mA and/or kV according to patient size and/or use of iterative reconstruction technique. COMPARISON:  Head CT 05/05/2021. Cervical spine CT 05/05/2021. Chest CT 08/27/2019. Thyroid ultrasound 05/16/2021. FINDINGS: CT HEAD FINDINGS Brain: Generalized cerebral atrophy. Commensurate prominence of the ventricles and  sulci. 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 This is new from the prior head CT of 05/05/2021. Mild patchy and ill-defined hypoattenuation elsewhere within the cerebral white matter, nonspecific but compatible chronic small vessel ischemic disease. There is no acute intracranial hemorrhage. No demarcated cortical infarct. No extra-axial fluid collection. No evidence of an intracranial mass. No midline shift. Vascular: No hyperdense vessel.  Atherosclerotic calcifications. Skull: No calvarial fracture or aggressive osseous lesion. Apparent small burr hole within the right frontal calvarium. Sinuses/Orbits: No mass or acute finding within the imaged orbits. Mucosal thickening within the bilateral frontal sinuses (mild right, moderate left). Bilateral ethmoid sinusitis (moderate right, severe left). Mucosal thickening within the bilateral sphenoid sinuses (mild right, moderate left). CT CERVICAL SPINE FINDINGS Alignment: Mild levocurvature of the cervical spine. Nonspecific reversal of the expected cervical lordosis. 2 mm grade 1 anterolisthesis at C2-C3 and C3-C4. 3 mm grade 1 anterolisthesis at C4-C5. Skull base and vertebrae: The basion-dental and atlanto-dental intervals are maintained.No evidence of acute fracture to the cervical spine. Soft tissues and spinal canal: No prevertebral fluid or swelling. No visible canal hematoma. Disc levels: Cervical spondylosis with multilevel disc space narrowing, disc bulges/central disc protrusions, posterior disc osteophyte complexes, uncovertebral hypertrophy and facet arthrosis. Disc space narrowing is greatest at C5-C6 and C6-C7 (advanced at these levels). Multilevel spinal canal narrowing. Most notably, disc protrusions contribute to apparent moderate spinal canal stenosis at C2-C3 and C3-C4, and a posterior disc osteophyte complex contributes to apparent moderate spinal canal stenosis at C6-C7. Multilevel bony neural  foraminal narrowing. Upper chest: No acute finding. Prior median sternotomy. Chronic nodule/foci of scarring (with associated calcifications) again demonstrated within imaged portions of the lung apices. Per prior radiology records, the patient has a history of sarcoidosis. Other: Known 2.2 cm right thyroid lobe nodule. This nodule was previously assessed with thyroid ultrasound on 05/16/2021. Please refer to this prior examination for further description and for recommendations. IMPRESSION: CT head: 1. No evidence of an acute intracranial abnormality. 2. Parenchymal atrophy and chronic small vessel ischemic disease, as described. 3. Paranasal sinus disease at the imaged levels as outlined. CT cervical spine: 1. No evidence of an acute cervical spine fracture. 2. Nonspecific reversal of the expected cervical lordosis. 3. Mild Levocurvature of the cervical spine. 4. Grade 1 anterolisthesis at C2-C3,  C3-C4 and C4-C5. 5. Cervical spondylosis as described. Electronically Signed   By: Rockey Childs D.O.   On: 06/27/2023 16:03   CT Cervical Spine Wo Contrast Result Date: 06/27/2023 CLINICAL DATA:  Provided history: Head trauma, minor.  Fall. EXAM: CT HEAD WITHOUT CONTRAST CT CERVICAL SPINE WITHOUT CONTRAST TECHNIQUE: Multidetector CT imaging of the head and cervical spine was performed following the standard protocol without intravenous contrast. Multiplanar CT image reconstructions of the cervical spine were also generated. RADIATION DOSE REDUCTION: This exam was performed according to the departmental dose-optimization program which includes automated exposure control, adjustment of the mA and/or kV according to patient size and/or use of iterative reconstruction technique. COMPARISON:  Head CT 05/05/2021. Cervical spine CT 05/05/2021. Chest CT 08/27/2019. Thyroid ultrasound 05/16/2021. FINDINGS: CT HEAD FINDINGS Brain: Generalized cerebral atrophy. Commensurate prominence of the ventricles and sulci. Chronic lacunar  infarct within the left corona radiata/caudate nucleus (series 2, image 18). This is new from the prior head CT of 05/05/2021. Mild patchy and ill-defined hypoattenuation elsewhere within the cerebral white matter, nonspecific but compatible chronic small vessel ischemic disease. There is no acute intracranial hemorrhage. No demarcated cortical infarct. No extra-axial fluid collection. No evidence of an intracranial mass. No midline shift. Vascular: No hyperdense vessel.  Atherosclerotic calcifications. Skull: No calvarial fracture or aggressive osseous lesion. Apparent small burr hole within the right frontal calvarium. Sinuses/Orbits: No mass or acute finding within the imaged orbits. Mucosal thickening within the bilateral frontal sinuses (mild right, moderate left). Bilateral ethmoid sinusitis (moderate right, severe left). Mucosal thickening within the bilateral sphenoid sinuses (mild right, moderate left). CT CERVICAL SPINE FINDINGS Alignment: Mild levocurvature of the cervical spine. Nonspecific reversal of the expected cervical lordosis. 2 mm grade 1 anterolisthesis at C2-C3 and C3-C4. 3 mm grade 1 anterolisthesis at C4-C5. Skull base and vertebrae: The basion-dental and atlanto-dental intervals are maintained.No evidence of acute fracture to the cervical spine. Soft tissues and spinal canal: No prevertebral fluid or swelling. No visible canal hematoma. Disc levels: Cervical spondylosis with multilevel disc space narrowing, disc bulges/central disc protrusions, posterior disc osteophyte complexes, uncovertebral hypertrophy and facet arthrosis. Disc space narrowing is greatest at C5-C6 and C6-C7 (advanced at these levels). Multilevel spinal canal narrowing. Most notably, disc protrusions contribute to apparent moderate spinal canal stenosis at C2-C3 and C3-C4, and a posterior disc osteophyte complex contributes to apparent moderate spinal canal stenosis at C6-C7. Multilevel bony neural foraminal narrowing.  Upper chest: No acute finding. Prior median sternotomy. Chronic nodule/foci of scarring (with associated calcifications) again demonstrated within imaged portions of the lung apices. Per prior radiology records, the patient has a history of sarcoidosis. Other: Known 2.2 cm right thyroid lobe nodule. This nodule was previously assessed with thyroid ultrasound on 05/16/2021. Please refer to this prior examination for further description and for recommendations. IMPRESSION: CT head: 1. No evidence of an acute intracranial abnormality. 2. Parenchymal atrophy and chronic small vessel ischemic disease, as described. 3. Paranasal sinus disease at the imaged levels as outlined. CT cervical spine: 1. No evidence of an acute cervical spine fracture. 2. Nonspecific reversal of the expected cervical lordosis. 3. Mild Levocurvature of the cervical spine. 4. Grade 1 anterolisthesis at C2-C3, C3-C4 and C4-C5. 5. Cervical spondylosis as described. Electronically Signed   By: Rockey Childs D.O.   On: 06/27/2023 16:03    Pending Labs Unresulted Labs (From admission, onward)     Start     Ordered   06/29/23 0500  CBC  Daily,   R  06/28/23 1515   06/29/23 0500  Basic metabolic panel  Daily,   R      06/28/23 1515   06/27/23 1845  Urine Culture  Once,   URGENT       Question:  Indication  Answer:  Bacteriuria screening (OB/GYN or Uro)   06/27/23 1844            Vitals/Pain Today's Vitals   06/29/23 0114 06/29/23 0153 06/29/23 0215 06/29/23 0219  BP:   (!) 140/63   Pulse:   81   Resp:   17   Temp: 97.6 F (36.4 C)     TempSrc: Oral     SpO2:   100%   Weight:      Height:      PainSc:  Asleep  0-No pain    Isolation Precautions No active isolations  Medications Medications  cefTRIAXone  (ROCEPHIN ) 2 g in sodium chloride  0.9 % 100 mL IVPB (0 g Intravenous Stopped 06/28/23 2226)  0.9 %  sodium chloride  infusion (0 mLs Intravenous Stopped 06/29/23 0025)  insulin  aspart (novoLOG ) injection 0-5 Units  (5 Units Subcutaneous Given 06/28/23 2119)  insulin  aspart (novoLOG ) injection 0-9 Units (5 Units Subcutaneous Given 06/28/23 1641)  albuterol  (PROVENTIL ) (2.5 MG/3ML) 0.083% nebulizer solution 2.5 mg (has no administration in time range)  dextromethorphan -guaiFENesin  (MUCINEX  DM) 30-600 MG per 12 hr tablet 1 tablet (has no administration in time range)  ondansetron  (ZOFRAN ) injection 4 mg (has no administration in time range)  hydrALAZINE  (APRESOLINE ) injection 5 mg (has no administration in time range)  acetaminophen  (TYLENOL ) 160 MG/5ML solution 650 mg (650 mg Oral Given 06/29/23 0110)  heparin  injection 5,000 Units (5,000 Units Subcutaneous Given 06/28/23 2119)  oxyCODONE  (Oxy IR/ROXICODONE ) immediate release tablet 5 mg (has no administration in time range)  atorvastatin  (LIPITOR) tablet 20 mg (20 mg Oral Given 06/28/23 1640)  sertraline  (ZOLOFT ) tablet 50 mg (50 mg Oral Given 06/28/23 0912)  insulin  glargine-yfgn (SEMGLEE ) injection 15 Units (15 Units Subcutaneous Given 06/28/23 0913)  pantoprazole  (PROTONIX ) EC tablet 40 mg (40 mg Oral Given 06/28/23 0912)  ipratropium-albuterol  (DUONEB) 0.5-2.5 (3) MG/3ML nebulizer solution 3 mL (3 mLs Nebulization Given 06/29/23 0110)  tamsulosin  (FLOMAX ) capsule 0.8 mg (0.8 mg Oral Given 06/28/23 2120)  methylPREDNISolone  sodium succinate  (SOLU-MEDROL ) 40 mg/mL injection 40 mg (40 mg Intravenous Given 06/28/23 2119)  metoprolol  succinate (TOPROL -XL) 24 hr tablet 50 mg (has no administration in time range)  ipratropium-albuterol  (DUONEB) 0.5-2.5 (3) MG/3ML nebulizer solution 3 mL (3 mLs Nebulization Given 06/27/23 1930)  sodium chloride  0.9 % bolus 500 mL (0 mLs Intravenous Stopped 06/27/23 2016)  cefTRIAXone  (ROCEPHIN ) 2 g in sodium chloride  0.9 % 100 mL IVPB (0 g Intravenous Stopped 06/27/23 2039)    Mobility non-ambulatory     Focused Assessments Neuro Assessment Handoff:  Swallow screen pass? Yes  Cardiac Rhythm: Normal sinus rhythm       Neuro Assessment:  Exceptions to WDL    R Recommendations: See Admitting Provider Note  Report given to:   Additional Notes:

## 2023-06-30 DIAGNOSIS — N3000 Acute cystitis without hematuria: Secondary | ICD-10-CM | POA: Diagnosis not present

## 2023-06-30 LAB — GLUCOSE, CAPILLARY
Glucose-Capillary: 157 mg/dL — ABNORMAL HIGH (ref 70–99)
Glucose-Capillary: 126 mg/dL — ABNORMAL HIGH (ref 70–99)
Glucose-Capillary: 196 mg/dL — ABNORMAL HIGH (ref 70–99)
Glucose-Capillary: 234 mg/dL — ABNORMAL HIGH (ref 70–99)

## 2023-06-30 LAB — CBC
HCT: 25.8 % — ABNORMAL LOW (ref 39.0–52.0)
Hemoglobin: 8.7 g/dL — ABNORMAL LOW (ref 13.0–17.0)
MCH: 29.3 pg (ref 26.0–34.0)
MCHC: 33.7 g/dL (ref 30.0–36.0)
MCV: 86.9 fL (ref 80.0–100.0)
Platelets: 99 10*3/uL — ABNORMAL LOW (ref 150–400)
RBC: 2.97 MIL/uL — ABNORMAL LOW (ref 4.22–5.81)
RDW: 14.8 % (ref 11.5–15.5)
WBC: 6.3 10*3/uL (ref 4.0–10.5)
nRBC: 0 % (ref 0.0–0.2)

## 2023-06-30 LAB — BASIC METABOLIC PANEL
Anion gap: 10 (ref 5–15)
BUN: 89 mg/dL — ABNORMAL HIGH (ref 8–23)
CO2: 18 mmol/L — ABNORMAL LOW (ref 22–32)
Calcium: 8.3 mg/dL — ABNORMAL LOW (ref 8.9–10.3)
Chloride: 106 mmol/L (ref 98–111)
Creatinine, Ser: 2.41 mg/dL — ABNORMAL HIGH (ref 0.61–1.24)
GFR, Estimated: 26 mL/min — ABNORMAL LOW (ref >60)
Glucose, Bld: 183 mg/dL — ABNORMAL HIGH (ref 70–99)
Potassium: 5 mmol/L (ref 3.5–5.1)
Sodium: 134 mmol/L — ABNORMAL LOW (ref 135–145)

## 2023-06-30 MED ORDER — LOPERAMIDE HCL 2 MG PO CAPS
4.0000 mg | ORAL_CAPSULE | Freq: Once | ORAL | Status: AC
  Administered 2023-06-30: 4 mg via ORAL
  Filled 2023-06-30: qty 2

## 2023-06-30 NOTE — Progress Notes (Signed)
 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 Urine shows containment and repeat cx would likely show no growth as pt has been on abxs already. Continue on IV rocephin  x 5 days   Generalized weakness: PT/OT recs SNF    Hx of CAD: s/p two-vessel CABG 2011. Continue on statin    HTN: continue on reduced dose of metoprolol . Holding valsartan, imdur    Chronic diastolic CHF: appears compensated. Holding lasix . Monitor I/Os   AKI on CKDIIIa: likely due to UTI.  CT scan showed mild left hydronephrosis with proximal hydroureter,  no obstructing stone, which seems to be a chronic issue. Cr is trending down from day prior   PAF: continue on reduced dose of metoprolol . Not on chronic anticoagulation   DM2: HbA1c 6.8, well controlled. Continue on glargine, SSI w/ accuchecks   Hx of sarcoidosis: continue on steroids, bronchodilators    Dementia: continue w/ supportive care    HLD: continue on statin    Thrombocytopenia: chronic. Labile    BPH: continue on home dose of flomax    Fall: at home. PT/OT recs SNF   Depression: severity unknown. Continue on home dose of sertraline         DVT prophylaxis: heparin   Code Status: DNR  Family Communication: discussed pt's care w/ [name] and answered her  questions  Disposition Plan: likely d/c to SNF  Level of care: Telemetry Medical  Status is: Inpatient Remains inpatient appropriate because: severity of illness    Consultants:    Procedures:   Antimicrobials: rocephin    Subjective: Pt c/o fatigue   Objective: Vitals:   06/29/23 2021 06/30/23 0500 06/30/23 0511 06/30/23 0813  BP: (!) 172/87  130/85 (!) 160/80  Pulse: 87  78 81  Resp: 16  18 18   Temp: 97.6 F (36.4 C)   97.8 F (36.6 C)  TempSrc:    Oral  SpO2: 100%  99% 99%  Weight:  73.7 kg    Height:        Intake/Output Summary (Last 24 hours) at 06/30/2023 0937 Last data filed at 06/30/2023 0519 Gross per 24 hour  Intake 580 ml  Output 650 ml  Net -70 ml   Filed Weights   06/27/23 1505 06/29/23 0421 06/30/23 0500  Weight: 81.6 kg 78.6 kg 73.7 kg    Examination:  General exam: appears calm & comfortable Respiratory system: clear breath sounds b/l  Cardiovascular system:  S1 & S2+. Gastrointestinal system: abd is soft, NT, ND & hypoactive bowel sounds  Central nervous system: alert & awake. Moves all extremities Psychiatry: judgement and insight appears poor. Flat mood and affect    Data Reviewed: I have personally reviewed following labs and imaging studies  CBC: Recent Labs  Lab 06/27/23 1508 06/28/23 0253 06/29/23 0303 06/30/23 0613  WBC 9.0 5.6 4.4 6.3  HGB 8.9* 7.9* 8.9* 8.7*  HCT 27.2* 23.2* 27.1* 25.8*  MCV 89.2 86.9 89.4 86.9  PLT 113* 91* 86* 99*   Basic Metabolic Panel: Recent Labs  Lab 06/27/23 1508 06/28/23 0253 06/29/23 0303 06/30/23 0613  NA 133* 136 134* 134*  K 4.8 4.4 4.8 5.0  CL 102 106 104 106  CO2 18* 19* 16* 18*  GLUCOSE 207* 147* 295* 183*  BUN 95* 95* 88* 89*  CREATININE 3.72* 3.45* 2.90* 2.41*  CALCIUM  8.2* 7.9* 8.2* 8.3*   GFR: Estimated Creatinine Clearance: 21 mL/min (A) (by C-G formula based on SCr of 2.41 mg/dL (H)). Liver Function Tests: Recent Labs  Lab 06/27/23 1508  AST 12*  ALT 15   ALKPHOS 60  BILITOT 0.7  PROT 7.0  ALBUMIN  3.6   No results for input(s): LIPASE, AMYLASE in the last 168 hours. No results for input(s): AMMONIA in the last 168 hours. Coagulation Profile: No results for input(s): INR, PROTIME in the last 168 hours. Cardiac Enzymes: No results for input(s): CKTOTAL, CKMB, CKMBINDEX, TROPONINI in the last 168 hours. BNP (last 3 results) No results for input(s): PROBNP in the last 8760 hours. HbA1C: No results for input(s): HGBA1C in the last 72 hours. CBG: Recent Labs  Lab 06/29/23 0756 06/29/23 1147 06/29/23 1543 06/29/23 2025 06/30/23 0827  GLUCAP 297* 289* 210* 204* 157*   Lipid Profile: No results for input(s): CHOL, HDL, LDLCALC, TRIG, CHOLHDL, LDLDIRECT in the last 72 hours. Thyroid Function Tests: No results for input(s): TSH, T4TOTAL, FREET4, T3FREE, THYROIDAB in the last 72 hours. Anemia Panel: No results for input(s): VITAMINB12, FOLATE, FERRITIN, TIBC, IRON, RETICCTPCT in the last 72 hours. Sepsis Labs: Recent Labs  Lab 06/27/23 2216  LATICACIDVEN 0.7    Recent Results (from the past 240 hours)  Resp panel by RT-PCR (RSV, Flu A&B, Covid) Anterior Nasal Swab     Status: None   Collection Time: 06/27/23  3:09 PM   Specimen: Anterior Nasal Swab  Result Value Ref Range Status   SARS Coronavirus 2 by RT PCR NEGATIVE NEGATIVE Final    Comment: (NOTE) SARS-CoV-2 target nucleic acids are NOT DETECTED.  The SARS-CoV-2 RNA is generally detectable in upper respiratory specimens during the acute phase of infection. The lowest concentration of SARS-CoV-2 viral copies this assay can detect is 138 copies/mL. A negative result does not preclude SARS-Cov-2 infection and should not be used as the sole basis for treatment or other patient management decisions. A negative result may occur with  improper specimen collection/handling, submission of specimen other than nasopharyngeal  swab, presence of viral mutation(s) within the areas targeted by this assay, and inadequate number of viral copies(<138 copies/mL). A negative result must be combined with clinical observations, patient history, and epidemiological information. The expected result is Negative.  Fact Sheet for Patients:  bloggercourse.com  Fact Sheet for Healthcare Providers:  seriousbroker.it  This test is no t yet approved or cleared by the [place]  FDA and  has been authorized for detection and/or diagnosis of SARS-CoV-2 by FDA under an Emergency Use Authorization (EUA). This EUA will remain  in effect (meaning this test can be used) for the duration of the COVID-19 declaration under Section 564(b)(1) of the Act, 21 U.S.C.section 360bbb-3(b)(1), unless the authorization is terminated  or revoked sooner.       Influenza A by PCR NEGATIVE NEGATIVE Final   Influenza B by PCR NEGATIVE NEGATIVE Final    Comment: (NOTE) The Xpert Xpress SARS-CoV-2/FLU/RSV plus assay is intended  as an aid in the diagnosis of influenza from Nasopharyngeal swab specimens and should not be used as a sole basis for treatment. Nasal washings and aspirates are unacceptable for Xpert Xpress SARS-CoV-2/FLU/RSV testing.  Fact Sheet for Patients: bloggercourse.com  Fact Sheet for Healthcare Providers: seriousbroker.it  This test is not yet approved or cleared by the [place]  FDA and has been authorized for detection and/or diagnosis of SARS-CoV-2 by FDA under an Emergency Use Authorization (EUA). This EUA will remain in effect (meaning this test can be used) for the duration of the COVID-19 declaration under Section 564(b)(1) of the Act, 21 U.S.C. section 360bbb-3(b)(1), unless the authorization is terminated or revoked.     Resp Syncytial Virus by PCR NEGATIVE NEGATIVE Final    Comment: (NOTE) Fact Sheet for  Patients: bloggercourse.com  Fact Sheet for Healthcare Providers: seriousbroker.it  This test is not yet approved or cleared by the [place]  FDA and has been authorized for detection and/or diagnosis of SARS-CoV-2 by FDA under an Emergency Use Authorization (EUA). This EUA will remain in effect (meaning this test can be used) for the duration of the COVID-19 declaration under Section 564(b)(1) of the Act, 21 U.S.C. section 360bbb-3(b)(1), unless the authorization is terminated or revoked.  Performed at [facility], [address], [place], [place] [address]   Urine Culture     Status: Abnormal   Collection Time: 06/27/23  6:54 PM   Specimen: Urine, Random  Result Value Ref Range Status   Specimen Description   Final    URINE, RANDOM Performed at [facility], [address] [place], [place] [address]    Special Requests   Final    NONE Performed at [facility], [address] [place], [place] [address]    Culture MULTIPLE SPECIES PRESENT, SUGGEST RECOLLECTION (A)  Final   Report Status 06/29/2023 FINAL  Final  Blood culture (routine x 2)     Status: None (Preliminary result)   Collection Time: 06/27/23  7:56 PM   Specimen: BLOOD  Result Value Ref Range Status   Specimen Description BLOOD RIGHT ARM  Final   Special Requests   Final    BOTTLES DRAWN AEROBIC AND ANAEROBIC Blood Culture results may not be optimal due to an inadequate volume of blood received in culture bottles   Culture   Final    NO GROWTH 3 DAYS Performed at [facility], [address], [place], [place] [address]    Report Status PENDING  Incomplete  Blood culture (routine x 2)     Status: None (Preliminary result)   Collection Time: 06/27/23  7:56 PM   Specimen: BLOOD  Result Value Ref Range Status   Specimen Description BLOOD LEFT ARM  Final   Special Requests   Final    BOTTLES DRAWN AEROBIC AND  ANAEROBIC Blood Culture results may not be optimal due to an inadequate volume of blood received in culture bottles   Culture   Final    NO GROWTH 3 DAYS Performed at [facility], [address], [place], [place] [address]    Report Status PENDING  Incomplete         Radiology Studies: No results found.       Scheduled Meds:  atorvastatin   20 mg Oral QAC supper   benzonatate   200 mg Oral BID   heparin   5,000 Units Subcutaneous Q8H   insulin  aspart  0-5 Units Subcutaneous QHS   insulin  aspart  0-9 Units Subcutaneous  TID WC   insulin  aspart  4 Units Subcutaneous TID WC   insulin  glargine-yfgn  20 Units Subcutaneous Daily   methylPREDNISolone  (SOLU-MEDROL ) injection  40 mg Intravenous Q12H   metoprolol  succinate  50 mg Oral Daily   pantoprazole   40 mg Oral Daily   sertraline   50 mg Oral Daily   tamsulosin   0.8 mg Oral QHS   Continuous Infusions:  cefTRIAXone  (ROCEPHIN )  IV Stopped (06/29/23 2139)     LOS: 3 days        Anthony CHRISTELLA Pouch, MD [facility] Pager [phone]  If 7PM-7AM, please contact night-coverage [email or web address] 06/30/2023, 9:37 AM

## 2023-06-30 NOTE — Plan of Care (Signed)

## 2023-06-30 NOTE — Progress Notes (Signed)
 Visited with Peter Robertson at his bedside, he is resting in bed with eyes closed and does not acknowledge my presence in room. Per chart review, referral has been placed for hospice services to follow on Peter Robertson return to [facility], likely early next week.  No acute ongoing palliative needs, PMT will follow peripherally, please reengage as needs arise.  No Charge.  Waddell Lesches, DNP, AGNP-C Palliative Medicine  Please call Palliative Medicine team phone with any questions [phone]. For individual providers please see AMION.

## 2023-06-30 NOTE — Progress Notes (Signed)
 [facility] ([facility])  Hospice Liaison Note  Follow upon new referral for hospice at [facility], [facility] where patient was living prior to hospitalization.   Marinell Nova SW, [facility] hospital liaison spoke with patient's caregiver, [name] Ford last evening.  He explained hospice services and she would like to proceed with d/c on Monday back to [facility] with hospice services.  Hospital liaison team will follow patient through final disposition.  Please do not hesitate to call with any hospice related questions or concerns.  [name] Na, RN Nurse Liaison [phone]

## 2023-06-30 NOTE — Plan of Care (Signed)
  Problem: Education: Goal: Ability to describe self-care measures that may prevent or decrease complications (Diabetes Survival Skills Education) will improve Outcome: Progressing   Problem: Coping: Goal: Ability to adjust to condition or change in health will improve Outcome: Progressing   Problem: Health Behavior/Discharge Planning: Goal: Ability to manage health-related needs will improve Outcome: Progressing   Problem: Nutritional: Goal: Maintenance of adequate nutrition will improve Outcome: Progressing   Problem: Skin Integrity: Goal: Risk for impaired skin integrity will decrease Outcome: Progressing   Problem: Education: Goal: Knowledge of General Education information will improve Description: Including pain rating scale, medication(s)/side effects and non-pharmacologic comfort measures Outcome: Progressing   Problem: Clinical Measurements: Goal: Will remain free from infection Outcome: Progressing Goal: Cardiovascular complication will be avoided Outcome: Progressing   Problem: Coping: Goal: Level of anxiety will decrease Outcome: Progressing   Problem: Elimination: Goal: Will not experience complications related to urinary retention Outcome: Progressing   Problem: Pain Management: Goal: General experience of comfort will improve Outcome: Progressing

## 2023-07-01 ENCOUNTER — Encounter: Payer: Self-pay | Admitting: Internal Medicine

## 2023-07-01 DIAGNOSIS — N3000 Acute cystitis without hematuria: Secondary | ICD-10-CM | POA: Diagnosis not present

## 2023-07-01 LAB — BASIC METABOLIC PANEL
Anion gap: 11 (ref 5–15)
BUN: 82 mg/dL — ABNORMAL HIGH (ref 8–23)
CO2: 18 mmol/L — ABNORMAL LOW (ref 22–32)
Calcium: 8.9 mg/dL (ref 8.9–10.3)
Chloride: 107 mmol/L (ref 98–111)
Creatinine, Ser: 2.29 mg/dL — ABNORMAL HIGH (ref 0.61–1.24)
GFR, Estimated: 28 mL/min — ABNORMAL LOW (ref >60)
Glucose, Bld: 176 mg/dL — ABNORMAL HIGH (ref 70–99)
Potassium: 5.2 mmol/L — ABNORMAL HIGH (ref 3.5–5.1)
Sodium: 136 mmol/L (ref 135–145)

## 2023-07-01 LAB — GLUCOSE, CAPILLARY
Glucose-Capillary: 170 mg/dL — ABNORMAL HIGH (ref 70–99)
Glucose-Capillary: 189 mg/dL — ABNORMAL HIGH (ref 70–99)
Glucose-Capillary: 154 mg/dL — ABNORMAL HIGH (ref 70–99)
Glucose-Capillary: 172 mg/dL — ABNORMAL HIGH (ref 70–99)

## 2023-07-01 LAB — POTASSIUM: Potassium: 5.3 mmol/L — ABNORMAL HIGH (ref 3.5–5.1)

## 2023-07-01 LAB — CBC
HCT: 27.6 % — ABNORMAL LOW (ref 39.0–52.0)
Hemoglobin: 9.5 g/dL — ABNORMAL LOW (ref 13.0–17.0)
MCH: 29.8 pg (ref 26.0–34.0)
MCHC: 34.4 g/dL (ref 30.0–36.0)
MCV: 86.5 fL (ref 80.0–100.0)
Platelets: 106 10*3/uL — ABNORMAL LOW (ref 150–400)
RBC: 3.19 MIL/uL — ABNORMAL LOW (ref 4.22–5.81)
RDW: 14.8 % (ref 11.5–15.5)
WBC: 5.9 10*3/uL (ref 4.0–10.5)
nRBC: 0 % (ref 0.0–0.2)

## 2023-07-01 MED ORDER — METHYLPREDNISOLONE SODIUM SUCC 40 MG IJ SOLR
40.0000 mg | INTRAMUSCULAR | Status: DC
  Administered 2023-07-02: 40 mg via INTRAVENOUS
  Filled 2023-07-01 (×2): qty 1

## 2023-07-01 MED ORDER — SODIUM ZIRCONIUM CYCLOSILICATE 10 G PO PACK
10.0000 g | PACK | Freq: Once | ORAL | Status: AC
  Administered 2023-07-01: 10 g via ORAL
  Filled 2023-07-01 (×2): qty 1

## 2023-07-01 NOTE — Progress Notes (Signed)
 PROGRESS NOTE    Peter Robertson  FMW:[id number] DOB: 1940/10/02 DOA: 06/27/2023 PCP: Derenda Rockers, MD  Assessment & Plan:   Principal Problem:   UTI (urinary tract infection) Active Problems:   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)   Hypertension   Chronic diastolic CHF (congestive heart failure) (HCC)   Acute renal failure superimposed on stage 3a chronic kidney disease (HCC)   Paroxysmal atrial fibrillation (HCC)   Type II diabetes mellitus with renal manifestations (HCC)   Sarcoidosis   Dementia without behavioral disturbance (HCC)   Hyperlipidemia   Thrombocytopenia (HCC)   BPH (benign prostatic hyperplasia)   Fall at home, initial encounter   Depression  Assessment and Plan: UTI: UA was positive. Urine shows containment and repeat cx would likely show no growth as pt has been on abxs already. Will complete rocephin  course today   Generalized weakness: PT/OT recs SNF    Hx of CAD: s/p two-vessel CABG 2011.Continue on statin   HTN: continue on a reduced dose of metoprolol . Holding valsartan, imdur    Chronic diastolic CHF: appears compensated. Holding lasix . Monitor I/Os   AKI on CKDIIIa: likely due to UTI.  CT scan showed mild left hydronephrosis with proximal hydroureter,  no obstructing stone, which seems to be a chronic issue. Cr continues to trend down daily   Hyperkalemia: lokelma  x1 ordered. Repeat K level ordered.   PAF: continue on reduced dose of metoprolol . Not on chronic on anticoagulation   DM2: HbA1c 6.8, well controlled. Continue on glargine, SSI w/ accuchecks   Hx of sarcoidosis: start steroid taper. Continue on bronchodilators    Dementia: continue w/ supportive care    HLD: continue on statin    Thrombocytopenia: chronic. Labile    AEY:rnwupwlz on home dose of flomax    Fall: at home. PT/OT recs SNF  Depression: severity unknown. Continue on home dose of sertraline         DVT prophylaxis: heparin   Code Status: DNR   Family Communication:  Disposition Plan: likely d/c to SNF  Level of care: Telemetry Medical  Status is: Inpatient Remains inpatient appropriate because: severity of illness    Consultants:    Procedures:   Antimicrobials: rocephin    Subjective: Pt c/o malaise   Objective: Vitals:   06/30/23 2052 07/01/23 0047 07/01/23 0441 07/01/23 0500  BP: (!) 141/62 (!) 140/67 131/70   Pulse: 84 84 79   Resp: 20 16 16    Temp: 98.8 F (37.1 C) 98.2 F (36.8 C) 98 F (36.7 C)   TempSrc: Oral  Oral   SpO2: 97% 100% 100%   Weight:    73.7 kg  Height:        Intake/Output Summary (Last 24 hours) at 07/01/2023 0811 Last data filed at 06/30/2023 1528 Gross per 24 hour  Intake 240 ml  Output --  Net 240 ml   Filed Weights   06/29/23 0421 06/30/23 0500 07/01/23 0500  Weight: 78.6 kg 73.7 kg 73.7 kg    Examination:  General exam: appears comfortable  Respiratory system: decreased breath sounds b/l   Cardiovascular system:  S1/S2+. No rubs or clicks  Gastrointestinal system: abd is soft, NT, ND & hypoactive bowel sounds  Central nervous system: alert & awake. Moves all extremities  Psychiatry: judgement and insight appears poor. Flat mood and affect    Data Reviewed: I have personally reviewed following labs and imaging studies  CBC: Recent Labs  Lab 06/27/23 1508 06/28/23 0253 06/29/23 0303 06/30/23 9386 07/01/23 9452  WBC 9.0 5.6 4.4 6.3 5.9  HGB 8.9* 7.9* 8.9* 8.7* 9.5*  HCT 27.2* 23.2* 27.1* 25.8* 27.6*  MCV 89.2 86.9 89.4 86.9 86.5  PLT 113* 91* 86* 99* 106*   Basic Metabolic Panel: Recent Labs  Lab 06/27/23 1508 06/28/23 0253 06/29/23 0303 06/30/23 0613 07/01/23 0547  NA 133* 136 134* 134* 136  K 4.8 4.4 4.8 5.0 5.2*  CL 102 106 104 106 107  CO2 18* 19* 16* 18* 18*  GLUCOSE 207* 147* 295* 183* 176*  BUN 95* 95* 88* 89* 82*  CREATININE 3.72* 3.45* 2.90* 2.41* 2.29*  CALCIUM  8.2* 7.9* 8.2* 8.3* 8.9   GFR: Estimated Creatinine Clearance: 22.1  mL/min (A) (by C-G formula based on SCr of 2.29 mg/dL (H)). Liver Function Tests: Recent Labs  Lab 06/27/23 1508  AST 12*  ALT 15  ALKPHOS 60  BILITOT 0.7  PROT 7.0  ALBUMIN  3.6   No results for input(s): LIPASE, AMYLASE in the last 168 hours. No results for input(s): AMMONIA in the last 168 hours. Coagulation Profile: No results for input(s): INR, PROTIME in the last 168 hours. Cardiac Enzymes: No results for input(s): CKTOTAL, CKMB, CKMBINDEX, TROPONINI in the last 168 hours. BNP (last 3 results) No results for input(s): PROBNP in the last 8760 hours. HbA1C: No results for input(s): HGBA1C in the last 72 hours. CBG: Recent Labs  Lab 06/30/23 0827 06/30/23 1158 06/30/23 1632 06/30/23 2055 07/01/23 0723  GLUCAP 157* 126* 196* 234* 170*   Lipid Profile: No results for input(s): CHOL, HDL, LDLCALC, TRIG, CHOLHDL, LDLDIRECT in the last 72 hours. Thyroid Function Tests: No results for input(s): TSH, T4TOTAL, FREET4, T3FREE, THYROIDAB in the last 72 hours. Anemia Panel: No results for input(s): VITAMINB12, FOLATE, FERRITIN, TIBC, IRON, RETICCTPCT in the last 72 hours. Sepsis Labs: Recent Labs  Lab 06/27/23 2216  LATICACIDVEN 0.7    Recent Results (from the past 240 hours)  Resp panel by RT-PCR (RSV, Flu A&B, Covid) Anterior Nasal Swab     Status: None   Collection Time: 06/27/23  3:09 PM   Specimen: Anterior Nasal Swab  Result Value Ref Range Status   SARS Coronavirus 2 by RT PCR NEGATIVE NEGATIVE Final    Comment: (NOTE) SARS-CoV-2 target nucleic acids are NOT DETECTED.  The SARS-CoV-2 RNA is generally detectable in upper respiratory specimens during the acute phase of infection. The lowest concentration of SARS-CoV-2 viral copies this assay can detect is 138 copies/mL. A negative result does not preclude SARS-Cov-2 infection and should not be used as the sole basis for treatment or other patient management  decisions. A negative result may occur with  improper specimen collection/handling, submission of specimen other than nasopharyngeal swab, presence of viral mutation(s) within the areas targeted by this assay, and inadequate number of viral copies(<138 copies/mL). A negative result must be combined with clinical observations, patient history, and epidemiological information. The expected result is Negative.  Fact Sheet for Patients:  bloggercourse.com  Fact Sheet for Healthcare Providers:  seriousbroker.it  This test is no t yet approved or cleared by the [place]  FDA and  has been authorized for detection and/or diagnosis of SARS-CoV-2 by FDA under an Emergency Use Authorization (EUA). This EUA will remain  in effect (meaning this test can be used) for the duration of the COVID-19 declaration under Section 564(b)(1) of the Act, 21 U.S.C.section 360bbb-3(b)(1), unless the authorization is terminated  or revoked sooner.       Influenza A by PCR NEGATIVE NEGATIVE Final  Influenza B by PCR NEGATIVE NEGATIVE Final    Comment: (NOTE) The Xpert Xpress SARS-CoV-2/FLU/RSV plus assay is intended as an aid in the diagnosis of influenza from Nasopharyngeal swab specimens and should not be used as a sole basis for treatment. Nasal washings and aspirates are unacceptable for Xpert Xpress SARS-CoV-2/FLU/RSV testing.  Fact Sheet for Patients: bloggercourse.com  Fact Sheet for Healthcare Providers: seriousbroker.it  This test is not yet approved or cleared by the [place]  FDA and has been authorized for detection and/or diagnosis of SARS-CoV-2 by FDA under an Emergency Use Authorization (EUA). This EUA will remain in effect (meaning this test can be used) for the duration of the COVID-19 declaration under Section 564(b)(1) of the Act, 21 U.S.C. section 360bbb-3(b)(1), unless the  authorization is terminated or revoked.     Resp Syncytial Virus by PCR NEGATIVE NEGATIVE Final    Comment: (NOTE) Fact Sheet for Patients: bloggercourse.com  Fact Sheet for Healthcare Providers: seriousbroker.it  This test is not yet approved or cleared by the [place]  FDA and has been authorized for detection and/or diagnosis of SARS-CoV-2 by FDA under an Emergency Use Authorization (EUA). This EUA will remain in effect (meaning this test can be used) for the duration of the COVID-19 declaration under Section 564(b)(1) of the Act, 21 U.S.C. section 360bbb-3(b)(1), unless the authorization is terminated or revoked.  Performed at [facility], [address] [place], [place] [address]   Urine Culture     Status: Abnormal   Collection Time: 06/27/23  6:54 PM   Specimen: Urine, Random  Result Value Ref Range Status   Specimen Description   Final    URINE, RANDOM Performed at [facility], [address] [place], [place] [address]    Special Requests   Final    NONE Performed at [facility], [address] [place], [place] [address]    Culture MULTIPLE SPECIES PRESENT, SUGGEST RECOLLECTION (A)  Final   Report Status 06/29/2023 FINAL  Final  Blood culture (routine x 2)     Status: None (Preliminary result)   Collection Time: 06/27/23  7:56 PM   Specimen: BLOOD  Result Value Ref Range Status   Specimen Description BLOOD RIGHT ARM  Final   Special Requests   Final    BOTTLES DRAWN AEROBIC AND ANAEROBIC Blood Culture results may not be optimal due to an inadequate volume of blood received in culture bottles   Culture   Final    NO GROWTH 4 DAYS Performed at [facility], [address] [place], [place] [address]    Report Status PENDING  Incomplete  Blood culture (routine x 2)     Status: None (Preliminary result)   Collection Time: 06/27/23  7:56 PM   Specimen: BLOOD   Result Value Ref Range Status   Specimen Description BLOOD LEFT ARM  Final   Special Requests   Final    BOTTLES DRAWN AEROBIC AND ANAEROBIC Blood Culture results may not be optimal due to an inadequate volume of blood received in culture bottles   Culture   Final    NO GROWTH 4 DAYS Performed at [facility], [address] [place], [place] [address]    Report Status PENDING  Incomplete         Radiology Studies: No results found.       Scheduled Meds:  atorvastatin   20 mg Oral QAC supper   benzonatate   200 mg Oral BID   heparin   5,000  Units Subcutaneous Q8H   insulin  aspart  0-5 Units Subcutaneous QHS   insulin  aspart  0-9 Units Subcutaneous TID WC   insulin  aspart  4 Units Subcutaneous TID WC   insulin  glargine-yfgn  20 Units Subcutaneous Daily   methylPREDNISolone  (SOLU-MEDROL ) injection  40 mg Intravenous Q12H   metoprolol  succinate  50 mg Oral Daily   pantoprazole   40 mg Oral Daily   sertraline   50 mg Oral Daily   sodium zirconium cyclosilicate   10 g Oral Once   tamsulosin   0.8 mg Oral QHS   Continuous Infusions:  cefTRIAXone  (ROCEPHIN )  IV 2 g (06/30/23 2248)     LOS: 4 days        Anthony CHRISTELLA Pouch, MD [facility] Pager [phone]  If 7PM-7AM, please contact night-coverage [email or web address] 07/01/2023, 8:11 AM

## 2023-07-01 NOTE — Progress Notes (Signed)
 [facility] ([facility])  Hospice Liaison Note   Follow upon new referral for hospice at LTC, [facility] where patient was living prior to hospitalization.   Spoke with Odella Ku today who states plan is still discharge back to [facility] tomorrow with hospice following.     Hospital liaison team will follow patient through final disposition.   Please do not hesitate to call with any hospice related questions or concerns.   [name] Na, RN Nurse Liaison [phone]

## 2023-07-02 DIAGNOSIS — N3000 Acute cystitis without hematuria: Secondary | ICD-10-CM | POA: Diagnosis not present

## 2023-07-02 LAB — CULTURE, BLOOD (ROUTINE X 2)
Culture: NO GROWTH
Culture: NO GROWTH

## 2023-07-02 LAB — CBC
HCT: 28.2 % — ABNORMAL LOW (ref 39.0–52.0)
Hemoglobin: 9.6 g/dL — ABNORMAL LOW (ref 13.0–17.0)
MCH: 29.2 pg (ref 26.0–34.0)
MCHC: 34 g/dL (ref 30.0–36.0)
MCV: 85.7 fL (ref 80.0–100.0)
Platelets: 116 10*3/uL — ABNORMAL LOW (ref 150–400)
RBC: 3.29 MIL/uL — ABNORMAL LOW (ref 4.22–5.81)
RDW: 15 % (ref 11.5–15.5)
WBC: 5.2 10*3/uL (ref 4.0–10.5)
nRBC: 0 % (ref 0.0–0.2)

## 2023-07-02 LAB — BASIC METABOLIC PANEL
Anion gap: 10 (ref 5–15)
BUN: 76 mg/dL — ABNORMAL HIGH (ref 8–23)
CO2: 21 mmol/L — ABNORMAL LOW (ref 22–32)
Calcium: 8.5 mg/dL — ABNORMAL LOW (ref 8.9–10.3)
Chloride: 104 mmol/L (ref 98–111)
Creatinine, Ser: 2.29 mg/dL — ABNORMAL HIGH (ref 0.61–1.24)
GFR, Estimated: 28 mL/min — ABNORMAL LOW (ref >60)
Glucose, Bld: 93 mg/dL (ref 70–99)
Potassium: 4.6 mmol/L (ref 3.5–5.1)
Sodium: 135 mmol/L (ref 135–145)

## 2023-07-02 LAB — GLUCOSE, CAPILLARY
Glucose-Capillary: 98 mg/dL (ref 70–99)
Glucose-Capillary: 182 mg/dL — ABNORMAL HIGH (ref 70–99)
Glucose-Capillary: 216 mg/dL — ABNORMAL HIGH (ref 70–99)
Glucose-Capillary: 169 mg/dL — ABNORMAL HIGH (ref 70–99)

## 2023-07-02 MED ORDER — OXYCODONE HCL 5 MG PO TABS: 5.0000 mg | ORAL_TABLET | ORAL | 0 refills | Status: AC | PRN

## 2023-07-02 MED ORDER — METOPROLOL SUCCINATE ER 50 MG PO TB24: 50.0000 mg | ORAL_TABLET | Freq: Every day | ORAL | 0 refills | Status: DC

## 2023-07-02 MED ORDER — PREDNISONE 20 MG PO TABS: 40.0000 mg | ORAL_TABLET | Freq: Every day | ORAL | 0 refills | Status: AC

## 2023-07-02 NOTE — Progress Notes (Signed)
 [facility] Liaison Note  Follow up with Nichole Jobs, TOC who states patient will be discharging back to [facility] today.  Hospital Liaison Team will continue to follow through final disposition.  [name] Na, RN Nurse Liaison [phone]

## 2023-07-02 NOTE — Discharge Summary (Addendum)
 Physician Discharge Summary  Peter Robertson FMW:[id number] DOB: Oct 02, 1940 DOA: 06/27/2023  PCP: Derenda Rockers, MD  Admit date: 06/27/2023 Discharge date: 07/03/23  Admitted From: [facility] Disposition:  [facility]  Recommendations for Outpatient Follow-up:  Follow up with PCP in 1-2 weeks Hospice will follow at [facility] ([facility]): no  Equipment/Devices:  Discharge Condition: stable  CODE STATUS: DNR Diet recommendation: Dysphagia II diet   Brief/Interim Summary: HPI was taken from Dr. Hilma: Peter Robertson is a 83 y.o. male with medical history significant of hypertension, hyperlipidemia, diabetes mellitus, CAD, dCHF, sarcoidosis, dementia, depression, BPH, CKD-3A, A-fib not on anticoagulants, thrombocytopenia, left kidney hydronephrosis (s/p of nephrostomy tube), who presents with fall.    Per report, patient fell off the wheelchair in the facility, no significant injury noted.  Patient denies any pain.  Patient has cough with coarse breathing sound, denies SOB.  He states that his use oxygen as needed normally.  Currently he is on 2 L oxygen with 100% of saturation in ED.  Denies chest pain, fever or chills.  No nausea, vomiting, diarrhea or abdominal pain.  Patient is mildly confused.  Not sure about his baseline mental status.   Data reviewed independently and ED Course: pt was found to have WBC 9.0, positive urinalysis (turbid appearance, moderate amount of leukocyte, many bacteria, WBC > 50), worsening renal function with creatinine 3.72, BUN 95, GFR 15 (recent baseline creatinine 1.35 on 11/27/2022), negative PCR for COVID, flu and RSV.  Temperature 99.1, blood pressure soft 125/70, 85/54 (MAP 66), heart rate 96, RR 19.  Chest x-ray showed sarcoidosis.  CT of head negative.  CT of C-spine is negative for acute injury.  Patient is admitted to telemetry bed as inpatient.   CT per renal stone protocol: 1. Mild left hydronephrosis with proximal hydroureter. No obstructing stone. 2.  Marked bladder wall thickening, increased from prior. Differential considerations include obstructive uropathy from enlarged prostate, cystitis, or infiltrative malignancy. 3. Bronchial wall thickening and mucous plugs in the lower lungs. 4. Splenomegaly.   Aortic Atherosclerosis (ICD10-   Chest x-ray 1. No acute cardiopulmonary process. 2. 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 Urine shows containment and repeat cx would likely show no growth as pt has been on abxs already. Completed abx course    Generalized weakness: PT/OT recs SNF    Hx of CAD: s/p two-vessel CABG 2011.Continue on statin    HTN: continue on a reduced dose of metoprolol  and restart home dose of imdur , valsartan. Hold for MAP < 65   Chronic diastolic CHF: appears compensated. Restart lasix  at d/c. Monitor I/Os    AKI on CKDIIIa: likely due to UTI.  CT scan showed mild left hydronephrosis with proximal hydroureter,  no obstructing stone, which seems to be a chronic issue. Cr is stable from day prior    Hyperkalemia: resolved    PAF:  continue on reduced dose of metoprolol . Not on chronic on anticoagulation   DM2: HbA1c 6.8, well controlled. Continue on home anti-DM meds at d/c    Hx of sarcoidosis: continue on prednisone  x 5 days more.    Dementia: continue w/ supportive  care    HLD: continue on statin    Thrombocytopenia: chronic. Labile    AEY:rnwupwlz on home dose of flomax     Fall: at home. PT/OT recs SNF   Depression: severity unknown. Continue on home dose of sertraline    Discharge Instructions  Discharge Instructions     Diet general   Complete by: As directed    Dysphagia II diet   Discharge instructions   Complete by: As directed    F/u w/ PCP in 1-2 weeks   Increase activity slowly   Complete by: As directed    No wound care   Complete by: As directed       Allergies as of 07/02/2023       Reactions   Sulfamethoxazole-trimethoprim Other (See Comments)   dizziness dizziness dizziness   Tape Rash   blisters        Medication List     STOP taking these medications    ascorbic acid  500 MG tablet Commonly known as: VITAMIN C    Fe Fum-Vit C-Vit B12-FA Caps capsule Commonly known as: TRIGELS-F FORTE   hydrocortisone  cream 1 %   zinc  sulfate (50mg  elemental zinc ) 220 (50 Zn) MG capsule       TAKE these medications    acetaminophen  325 MG tablet Commonly known as: TYLENOL  Take 2 tablets (650 mg total) by mouth every 6 (six) hours as needed for mild pain or fever.   albuterol  108 (90 Base) MCG/ACT inhaler Commonly known as: VENTOLIN  HFA Inhale 1-2 puffs into the lungs every 4 (four) hours as needed.   albuterol  (2.5 MG/3ML) 0.083% nebulizer solution Commonly known as: PROVENTIL  Take 2.5 mg by nebulization every 4 (four) hours as needed.   alum & mag hydroxide-simeth 200-200-20 MG/5ML suspension Commonly known as: MAALOX/MYLANTA Take 15 mLs by mouth every 6 (six) hours as needed for indigestion or heartburn.   atorvastatin  20 MG tablet Commonly known as: LIPITOR Take 20 mg by mouth at bedtime.   cholecalciferol  25 MCG (1000 UNIT) tablet Commonly known as: VITAMIN D3 Take 1,000 Units by mouth daily.   docusate sodium  100 MG capsule Commonly known as: COLACE Take 1 capsule (100 mg total) by mouth 2  (two) times daily as needed for mild constipation.   empagliflozin 10 MG Tabs tablet Commonly known as: JARDIANCE Take 10 mg by mouth daily.   feeding supplement Liqd Take 237 mLs by mouth 3 (three) times daily between meals.   furosemide  20 MG tablet Commonly known as: LASIX  Take 20 mg by mouth daily.   gabapentin  300 MG capsule Commonly known as: NEURONTIN  Take 1 capsule (300 mg total) by mouth 3 (three) times daily. What changed: when to take this   Gerhardt's butt cream Crea Apply 1 Application topically 3 (three) times daily as needed for irritation.   insulin  glargine 100 UNIT/ML injection Commonly known as: LANTUS  Inject 0.3 mLs (30 Units total) into the skin daily.   insulin  lispro 100 UNIT/ML KwikPen Commonly known as: HUMALOG Inject 2-12 Units into the skin 3 times daily with meals, bedtime and 2 AM. Sliding scale insulin  < 60 = Call MD 200-249 = 2 units 250-299 = 4 units 300-349 = 6 units 350-359 = 8 units 400-449 =  10 units 450-499 = 12 units >500 = Call MD   isosorbide  mononitrate 60 MG 24 hr tablet Commonly known as: IMDUR  Take 60 mg by mouth daily.   leptospermum manuka honey Pste paste Apply 1 Application topically daily. For 1 more week   metoprolol  succinate 50 MG 24 hr tablet Commonly known as: TOPROL -XL Take 1 tablet (50 mg total) by mouth daily. Take with or immediately following a meal. Start taking on: July 03, 2023 What changed:  medication strength how much to take additional instructions   nitroGLYCERIN  0.4 MG SL tablet Commonly known as: NITROSTAT  Place 0.4 mg under the tongue every 5 (five) minutes as needed for chest pain.   ondansetron  4 MG tablet Commonly known as: ZOFRAN  Take 1 tablet (4 mg total) by mouth every 6 (six) hours as needed for nausea.   oxyCODONE  5 MG immediate release tablet Commonly known as: Oxy IR/ROXICODONE  Take 1 tablet (5 mg total) by mouth every 4 (four) hours as needed for up to 1 day for moderate  pain (pain score 4-6) or severe pain (pain score 7-10). What changed: reasons to take this   predniSONE  20 MG tablet Commonly known as: DELTASONE  Take 2 tablets (40 mg total) by mouth daily for 5 days.   PreserVision AREDS 2+Multi Vit Caps Take 1 capsule by mouth 2 (two) times daily.   sertraline  50 MG tablet Commonly known as: ZOLOFT  Take 1 tablet by mouth daily.   tamsulosin  0.4 MG Caps capsule Commonly known as: FLOMAX  Take 0.8 mg by mouth at bedtime.   Tresiba FlexTouch 100 UNIT/ML FlexTouch Pen Generic drug: insulin  degludec Inject 25 Units into the skin at bedtime.   valsartan 80 MG tablet Commonly known as: DIOVAN Take 80 mg by mouth daily.        Allergies  Allergen Reactions   Sulfamethoxazole-Trimethoprim Other (See Comments)    dizziness dizziness dizziness    Tape Rash    blisters    Consultations: Palliative care   Procedures/Studies: CT Renal Stone Study Result Date: 06/27/2023 CLINICAL DATA:  Abdominal/flank pain, stone suspected, evaluation for obstruction. Fell off the bed at the doctor's office. EXAM: CT ABDOMEN AND PELVIS WITHOUT CONTRAST TECHNIQUE: Multidetector CT imaging of the abdomen and pelvis was performed following the standard protocol without IV contrast. RADIATION DOSE REDUCTION: This exam was performed according to the departmental dose-optimization program which includes automated exposure control, adjustment of the mA and/or kV according to patient size and/or use of iterative reconstruction technique. COMPARISON:  11/18/2022 FINDINGS: Lower chest: Calcified pleural plaques. Bronchial wall thickening and mucous plugging. Question right hilar adenopathy. Hepatobiliary: No acute abnormality. Pancreas: Coarse calcifications in the pancreatic head. No acute inflammation or ductal dilation. Spleen: Similar splenomegaly. Adrenals/Urinary Tract: Unremarkable adrenal glands. Mild left hydronephrosis with proximal hydroureter. No obstructing stone.  Marked bladder wall thickening. Stomach/Bowel: Normal caliber large and small bowel. No bowel wall thickening moderate colonic stool load. Stomach and appendix are within normal limits. Vascular/Lymphatic: Aortic atherosclerosis. No enlarged abdominal or pelvic lymph nodes. Reproductive: Enlarged prostate. Other: No free intraperitoneal fluid or air. Musculoskeletal: No acute fracture. Chronic compression fracture of L1. IMPRESSION: 1. Mild left hydronephrosis with proximal hydroureter. No obstructing stone. 2. Marked bladder wall thickening, increased from prior. Differential considerations include obstructive uropathy from enlarged prostate, cystitis, or infiltrative malignancy. 3. Bronchial wall thickening and mucous plugs in the lower lungs. 4. Splenomegaly. Aortic Atherosclerosis (ICD10-I70.0). Electronically Signed   By: Norman Gatlin M.D.   On: 06/27/2023 20:27   DG  Chest 2 View Result Date: 06/27/2023 CLINICAL DATA:  Wheezing.  Sarcoidosis. EXAM: CHEST - 2 VIEW COMPARISON:  Chest radiograph dated 11/18/2022. 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 No new consolidation. There is no pleural effusion pneumothorax. Stable cardiomediastinal silhouette. Median sternotomy wires. No acute osseous pathology. IMPRESSION: 1. No acute cardiopulmonary process. 2. 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 Electronically Signed   By: Vanetta Chou M.D.   On: 06/27/2023 16:54   CT Head Wo Contrast Result Date: 06/27/2023 CLINICAL DATA:  Provided history: Head trauma, minor.  Fall. EXAM: CT HEAD WITHOUT CONTRAST CT CERVICAL SPINE WITHOUT CONTRAST TECHNIQUE: Multidetector CT imaging of the head and cervical spine was performed following the standard protocol without intravenous contrast. Multiplanar CT image reconstructions of the cervical spine were also generated. RADIATION DOSE REDUCTION: This exam was performed according to the  departmental dose-optimization program which includes automated exposure control, adjustment of the mA and/or kV according to patient size and/or use of iterative reconstruction technique. COMPARISON:  Head CT 05/05/2021. Cervical spine CT 05/05/2021. Chest CT 08/27/2019. Thyroid ultrasound 05/16/2021. FINDINGS: CT HEAD FINDINGS Brain: Generalized cerebral atrophy. Commensurate prominence of the ventricles and sulci. 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 This is new from the prior head CT of 05/05/2021. Mild patchy and ill-defined hypoattenuation elsewhere within the cerebral white matter, nonspecific but compatible chronic small vessel ischemic disease. There is no acute intracranial hemorrhage. No demarcated cortical infarct. No extra-axial fluid collection. No evidence of an intracranial mass. No midline shift. Vascular: No hyperdense vessel.  Atherosclerotic calcifications. Skull: No calvarial fracture or aggressive osseous lesion. Apparent small burr hole within the right frontal calvarium. Sinuses/Orbits: No mass or acute finding within the imaged orbits. Mucosal thickening within the bilateral frontal sinuses (mild right, moderate left). Bilateral ethmoid sinusitis (moderate right, severe left). Mucosal thickening within the bilateral sphenoid sinuses (mild right, moderate left). CT CERVICAL SPINE FINDINGS Alignment: Mild levocurvature of the cervical spine. Nonspecific reversal of the expected cervical lordosis. 2 mm grade 1 anterolisthesis at C2-C3 and C3-C4. 3 mm grade 1 anterolisthesis at C4-C5. Skull base and vertebrae: The basion-dental and atlanto-dental intervals are maintained.No evidence of acute fracture to the cervical spine. Soft tissues and spinal canal: No prevertebral fluid or swelling. No visible canal hematoma. Disc levels: Cervical spondylosis with multilevel disc space narrowing, disc bulges/central disc protrusions, posterior disc  osteophyte complexes, uncovertebral hypertrophy and facet arthrosis. Disc space narrowing is greatest at C5-C6 and C6-C7 (advanced at these levels). Multilevel spinal canal narrowing. Most notably, disc protrusions contribute to apparent moderate spinal canal stenosis at C2-C3 and C3-C4, and a posterior disc osteophyte complex contributes to apparent moderate spinal canal stenosis at C6-C7. Multilevel bony neural foraminal narrowing. Upper chest: No acute finding. Prior median sternotomy. Chronic nodule/foci of scarring (with associated calcifications) again demonstrated within imaged portions of the lung apices. Per prior radiology records, the patient has a history of sarcoidosis. Other: Known 2.2 cm right thyroid lobe nodule. This nodule was previously assessed with thyroid ultrasound on 05/16/2021. Please refer to this prior examination for further description and for recommendations. IMPRESSION: CT head: 1. No evidence of an acute intracranial abnormality. 2. Parenchymal atrophy and chronic small vessel ischemic disease, as described. 3. Paranasal sinus disease at the imaged levels as outlined. CT cervical spine: 1. No evidence of an acute cervical spine fracture. 2. Nonspecific reversal of the expected cervical lordosis. 3. Mild Levocurvature of the cervical  spine. 4. Grade 1 anterolisthesis at C2-C3, C3-C4 and C4-C5. 5. Cervical spondylosis as described. Electronically Signed   By: Rockey Childs D.O.   On: 06/27/2023 16:03   CT Cervical Spine Wo Contrast Result Date: 06/27/2023 CLINICAL DATA:  Provided history: Head trauma, minor.  Fall. EXAM: CT HEAD WITHOUT CONTRAST CT CERVICAL SPINE WITHOUT CONTRAST TECHNIQUE: Multidetector CT imaging of the head and cervical spine was performed following the standard protocol without intravenous contrast. Multiplanar CT image reconstructions of the cervical spine were also generated. RADIATION DOSE REDUCTION: This exam was performed according to the departmental  dose-optimization program which includes automated exposure control, adjustment of the mA and/or kV according to patient size and/or use of iterative reconstruction technique. COMPARISON:  Head CT 05/05/2021. Cervical spine CT 05/05/2021. Chest CT 08/27/2019. Thyroid ultrasound 05/16/2021. FINDINGS: CT HEAD FINDINGS Brain: Generalized cerebral atrophy. Commensurate prominence of the ventricles and sulci. 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 This is new from the prior head CT of 05/05/2021. Mild patchy and ill-defined hypoattenuation elsewhere within the cerebral white matter, nonspecific but compatible chronic small vessel ischemic disease. There is no acute intracranial hemorrhage. No demarcated cortical infarct. No extra-axial fluid collection. No evidence of an intracranial mass. No midline shift. Vascular: No hyperdense vessel.  Atherosclerotic calcifications. Skull: No calvarial fracture or aggressive osseous lesion. Apparent small burr hole within the right frontal calvarium. Sinuses/Orbits: No mass or acute finding within the imaged orbits. Mucosal thickening within the bilateral frontal sinuses (mild right, moderate left). Bilateral ethmoid sinusitis (moderate right, severe left). Mucosal thickening within the bilateral sphenoid sinuses (mild right, moderate left). CT CERVICAL SPINE FINDINGS Alignment: Mild levocurvature of the cervical spine. Nonspecific reversal of the expected cervical lordosis. 2 mm grade 1 anterolisthesis at C2-C3 and C3-C4. 3 mm grade 1 anterolisthesis at C4-C5. Skull base and vertebrae: The basion-dental and atlanto-dental intervals are maintained.No evidence of acute fracture to the cervical spine. Soft tissues and spinal canal: No prevertebral fluid or swelling. No visible canal hematoma. Disc levels: Cervical spondylosis with multilevel disc space narrowing, disc bulges/central disc protrusions, posterior disc osteophyte  complexes, uncovertebral hypertrophy and facet arthrosis. Disc space narrowing is greatest at C5-C6 and C6-C7 (advanced at these levels). Multilevel spinal canal narrowing. Most notably, disc protrusions contribute to apparent moderate spinal canal stenosis at C2-C3 and C3-C4, and a posterior disc osteophyte complex contributes to apparent moderate spinal canal stenosis at C6-C7. Multilevel bony neural foraminal narrowing. Upper chest: No acute finding. Prior median sternotomy. Chronic nodule/foci of scarring (with associated calcifications) again demonstrated within imaged portions of the lung apices. Per prior radiology records, the patient has a history of sarcoidosis. Other: Known 2.2 cm right thyroid lobe nodule. This nodule was previously assessed with thyroid ultrasound on 05/16/2021. Please refer to this prior examination for further description and for recommendations. IMPRESSION: CT head: 1. No evidence of an acute intracranial abnormality. 2. Parenchymal atrophy and chronic small vessel ischemic disease, as described. 3. Paranasal sinus disease at the imaged levels as outlined. CT cervical spine: 1. No evidence of an acute cervical spine fracture. 2. Nonspecific reversal of the expected cervical lordosis. 3. Mild Levocurvature of the cervical spine. 4. Grade 1 anterolisthesis at C2-C3, C3-C4 and C4-C5. 5. Cervical spondylosis as described. Electronically Signed   By: Rockey Childs D.O.   On: 06/27/2023 16:03   (Echo, Carotid, EGD, Colonoscopy, ERCP)    Subjective: Pt c/o fatigue    Discharge Exam: Vitals:   07/02/23 0447  07/02/23 0841  BP: (!) 149/91 123/63  Pulse: 81 82  Resp: 19 15  Temp: (!) 97.5 F (36.4 C) 97.8 F (36.6 C)  SpO2: 99% 100%   Vitals:   07/01/23 2222 07/02/23 0247 07/02/23 0447 07/02/23 0841  BP: (!) 148/79  (!) 149/91 123/63  Pulse: 82  81 82  Resp: 20  19 15   Temp: 98 F (36.7 C)  (!) 97.5 F (36.4 C) 97.8 F (36.6 C)  TempSrc:    Oral  SpO2: 99%  99% 100%   Weight:  70.7 kg    Height:        General: Pt is alert, awake, not in acute distress Cardiovascular: S1/S2 +, no rubs, no gallops Respiratory: decreased breath sounds b/l  Abdominal: Soft, NT, ND, bowel sounds + Extremities: no cyanosis    The results of significant diagnostics from this hospitalization (including imaging, microbiology, ancillary and laboratory) are listed below for reference.     Microbiology: Recent Results (from the past 240 hours)  Resp panel by RT-PCR (RSV, Flu A&B, Covid) Anterior Nasal Swab     Status: None   Collection Time: 06/27/23  3:09 PM   Specimen: Anterior Nasal Swab  Result Value Ref Range Status   SARS Coronavirus 2 by RT PCR NEGATIVE NEGATIVE Final    Comment: (NOTE) SARS-CoV-2 target nucleic acids are NOT DETECTED.  The SARS-CoV-2 RNA is generally detectable in upper respiratory specimens during the acute phase of infection. The lowest concentration of SARS-CoV-2 viral copies this assay can detect is 138 copies/mL. A negative result does not preclude SARS-Cov-2 infection and should not be used as the sole basis for treatment or other patient management decisions. A negative result may occur with  improper specimen collection/handling, submission of specimen other than nasopharyngeal swab, presence of viral mutation(s) within the areas targeted by this assay, and inadequate number of viral copies(<138 copies/mL). A negative result must be combined with clinical observations, patient history, and epidemiological information. The expected result is Negative.  Fact Sheet for Patients:  bloggercourse.com  Fact Sheet for Healthcare Providers:  seriousbroker.it  This test is no t yet approved or cleared by the [place]  FDA and  has been authorized for detection and/or diagnosis of SARS-CoV-2 by FDA under an Emergency Use Authorization (EUA). This EUA will remain  in effect (meaning  this test can be used) for the duration of the COVID-19 declaration under Section 564(b)(1) of the Act, 21 U.S.C.section 360bbb-3(b)(1), unless the authorization is terminated  or revoked sooner.       Influenza A by PCR NEGATIVE NEGATIVE Final   Influenza B by PCR NEGATIVE NEGATIVE Final    Comment: (NOTE) The Xpert Xpress SARS-CoV-2/FLU/RSV plus assay is intended as an aid in the diagnosis of influenza from Nasopharyngeal swab specimens and should not be used as a sole basis for treatment. Nasal washings and aspirates are unacceptable for Xpert Xpress SARS-CoV-2/FLU/RSV testing.  Fact Sheet for Patients: bloggercourse.com  Fact Sheet for Healthcare Providers: seriousbroker.it  This test is not yet approved or cleared by the [place]  FDA and has been authorized for detection and/or diagnosis of SARS-CoV-2 by FDA under an Emergency Use Authorization (EUA). This EUA will remain in effect (meaning this test can be used) for the duration of the COVID-19 declaration under Section 564(b)(1) of the Act, 21 U.S.C. section 360bbb-3(b)(1), unless the authorization is terminated or revoked.     Resp Syncytial Virus by PCR NEGATIVE NEGATIVE Final    Comment: (NOTE) Fact  Sheet for Patients: bloggercourse.com  Fact Sheet for Healthcare Providers: seriousbroker.it  This test is not yet approved or cleared by the [place]  FDA and has been authorized for detection and/or diagnosis of SARS-CoV-2 by FDA under an Emergency Use Authorization (EUA). This EUA will remain in effect (meaning this test can be used) for the duration of the COVID-19 declaration under Section 564(b)(1) of the Act, 21 U.S.C. section 360bbb-3(b)(1), unless the authorization is terminated or revoked.  Performed at [facility], [address] [place], [place] [address]   Urine Culture     Status:  Abnormal   Collection Time: 06/27/23  6:54 PM   Specimen: Urine, Random  Result Value Ref Range Status   Specimen Description   Final    URINE, RANDOM Performed at [facility], [address] [place], [place] [address]    Special Requests   Final    NONE Performed at [facility], [address] [place], [place] [address]    Culture MULTIPLE SPECIES PRESENT, SUGGEST RECOLLECTION (A)  Final   Report Status 06/29/2023 FINAL  Final  Blood culture (routine x 2)     Status: None   Collection Time: 06/27/23  7:56 PM   Specimen: BLOOD  Result Value Ref Range Status   Specimen Description BLOOD RIGHT ARM  Final   Special Requests   Final    BOTTLES DRAWN AEROBIC AND ANAEROBIC Blood Culture results may not be optimal due to an inadequate volume of blood received in culture bottles   Culture   Final    NO GROWTH 5 DAYS Performed at [facility], [address] [place], [place] [address]    Report Status 07/02/2023 FINAL  Final  Blood culture (routine x 2)     Status: None   Collection Time: 06/27/23  7:56 PM   Specimen: BLOOD  Result Value Ref Range Status   Specimen Description BLOOD LEFT ARM  Final   Special Requests   Final    BOTTLES DRAWN AEROBIC AND ANAEROBIC Blood Culture results may not be optimal due to an inadequate volume of blood received in culture bottles   Culture   Final    NO GROWTH 5 DAYS Performed at [facility], [address] [place], [place] [address]    Report Status 07/02/2023 FINAL  Final     Labs: BNP (last 3 results) Recent Labs    06/27/23 1508  BNP 124.4*   Basic Metabolic Panel: Recent Labs  Lab 06/28/23 0253 06/29/23 0303 06/30/23 0613 07/01/23 0547 07/01/23 1548 07/02/23 0506  NA 136 134* 134* 136  --  135  K 4.4 4.8 5.0 5.2* 5.3* 4.6  CL 106 104 106 107  --  104  CO2 19* 16* 18* 18*  --  21*  GLUCOSE 147* 295* 183* 176*  --  93  BUN 95* 88* 89* 82*  --  76*  CREATININE 3.45* 2.90* 2.41*  2.29*  --  2.29*  CALCIUM  7.9* 8.2* 8.3* 8.9  --  8.5*   Liver Function Tests: Recent Labs  Lab 06/27/23 1508  AST 12*  ALT 15  ALKPHOS 60  BILITOT 0.7  PROT 7.0  ALBUMIN  3.6   No results for input(s): LIPASE, AMYLASE in the last 168 hours. No results for input(s): AMMONIA in the last 168 hours. CBC: Recent Labs  Lab 06/28/23 0253 06/29/23 0303 06/30/23 0613 07/01/23 0547 07/02/23 0506  WBC 5.6 4.4 6.3 5.9 5.2  HGB 7.9* 8.9* 8.7*  9.5* 9.6*  HCT 23.2* 27.1* 25.8* 27.6* 28.2*  MCV 86.9 89.4 86.9 86.5 85.7  PLT 91* 86* 99* 106* 116*   Cardiac Enzymes: No results for input(s): CKTOTAL, CKMB, CKMBINDEX, TROPONINI in the last 168 hours. BNP: Invalid input(s): POCBNP CBG: Recent Labs  Lab 07/01/23 1135 07/01/23 1539 07/01/23 2046 07/02/23 0817 07/02/23 1211  GLUCAP 189* 154* 172* 98 182*   D-Dimer No results for input(s): DDIMER in the last 72 hours. Hgb A1c No results for input(s): HGBA1C in the last 72 hours. Lipid Profile No results for input(s): CHOL, HDL, LDLCALC, TRIG, CHOLHDL, LDLDIRECT in the last 72 hours. Thyroid function studies No results for input(s): TSH, T4TOTAL, T3FREE, THYROIDAB in the last 72 hours.  Invalid input(s): FREET3 Anemia work up No results for input(s): VITAMINB12, FOLATE, FERRITIN, TIBC, IRON, RETICCTPCT in the last 72 hours. Urinalysis    Component Value Date/Time   COLORURINE YELLOW (A) 06/27/2023 1854   APPEARANCEUR TURBID (A) 06/27/2023 1854   LABSPEC 1.014 06/27/2023 1854   PHURINE 5.0 06/27/2023 1854   GLUCOSEU 50 (A) 06/27/2023 1854   HGBUR MODERATE (A) 06/27/2023 1854   BILIRUBINUR NEGATIVE 06/27/2023 1854   KETONESUR NEGATIVE 06/27/2023 1854   PROTEINUR 100 (A) 06/27/2023 1854   NITRITE NEGATIVE 06/27/2023 1854   LEUKOCYTESUR MODERATE (A) 06/27/2023 1854   Sepsis Labs Recent Labs  Lab 06/29/23 0303 06/30/23 0613 07/01/23 0547 07/02/23 0506  WBC 4.4 6.3  5.9 5.2   Microbiology Recent Results (from the past 240 hours)  Resp panel by RT-PCR (RSV, Flu A&B, Covid) Anterior Nasal Swab     Status: None   Collection Time: 06/27/23  3:09 PM   Specimen: Anterior Nasal Swab  Result Value Ref Range Status   SARS Coronavirus 2 by RT PCR NEGATIVE NEGATIVE Final    Comment: (NOTE) SARS-CoV-2 target nucleic acids are NOT DETECTED.  The SARS-CoV-2 RNA is generally detectable in upper respiratory specimens during the acute phase of infection. The lowest concentration of SARS-CoV-2 viral copies this assay can detect is 138 copies/mL. A negative result does not preclude SARS-Cov-2 infection and should not be used as the sole basis for treatment or other patient management decisions. A negative result may occur with  improper specimen collection/handling, submission of specimen other than nasopharyngeal swab, presence of viral mutation(s) within the areas targeted by this assay, and inadequate number of viral copies(<138 copies/mL). A negative result must be combined with clinical observations, patient history, and epidemiological information. The expected result is Negative.  Fact Sheet for Patients:  bloggercourse.com  Fact Sheet for Healthcare Providers:  seriousbroker.it  This test is no t yet approved or cleared by the [place]  FDA and  has been authorized for detection and/or diagnosis of SARS-CoV-2 by FDA under an Emergency Use Authorization (EUA). This EUA will remain  in effect (meaning this test can be used) for the duration of the COVID-19 declaration under Section 564(b)(1) of the Act, 21 U.S.C.section 360bbb-3(b)(1), unless the authorization is terminated  or revoked sooner.       Influenza A by PCR NEGATIVE NEGATIVE Final   Influenza B by PCR NEGATIVE NEGATIVE Final    Comment: (NOTE) The Xpert Xpress SARS-CoV-2/FLU/RSV plus assay is intended as an aid in the diagnosis of  influenza from Nasopharyngeal swab specimens and should not be used as a sole basis for treatment. Nasal washings and aspirates are unacceptable for Xpert Xpress SARS-CoV-2/FLU/RSV testing.  Fact Sheet for Patients: bloggercourse.com  Fact Sheet for Healthcare Providers: seriousbroker.it  This  test is not yet approved or cleared by the [place]  FDA and has been authorized for detection and/or diagnosis of SARS-CoV-2 by FDA under an Emergency Use Authorization (EUA). This EUA will remain in effect (meaning this test can be used) for the duration of the COVID-19 declaration under Section 564(b)(1) of the Act, 21 U.S.C. section 360bbb-3(b)(1), unless the authorization is terminated or revoked.     Resp Syncytial Virus by PCR NEGATIVE NEGATIVE Final    Comment: (NOTE) Fact Sheet for Patients: bloggercourse.com  Fact Sheet for Healthcare Providers: seriousbroker.it  This test is not yet approved or cleared by the [place]  FDA and has been authorized for detection and/or diagnosis of SARS-CoV-2 by FDA under an Emergency Use Authorization (EUA). This EUA will remain in effect (meaning this test can be used) for the duration of the COVID-19 declaration under Section 564(b)(1) of the Act, 21 U.S.C. section 360bbb-3(b)(1), unless the authorization is terminated or revoked.  Performed at [facility], [address] [place], [place] [address]   Urine Culture     Status: Abnormal   Collection Time: 06/27/23  6:54 PM   Specimen: Urine, Random  Result Value Ref Range Status   Specimen Description   Final    URINE, RANDOM Performed at [facility], [address] [place], [place] [address]    Special Requests   Final    NONE Performed at [facility], [address] [place], [place] [address]    Culture MULTIPLE SPECIES PRESENT, SUGGEST  RECOLLECTION (A)  Final   Report Status 06/29/2023 FINAL  Final  Blood culture (routine x 2)     Status: None   Collection Time: 06/27/23  7:56 PM   Specimen: BLOOD  Result Value Ref Range Status   Specimen Description BLOOD RIGHT ARM  Final   Special Requests   Final    BOTTLES DRAWN AEROBIC AND ANAEROBIC Blood Culture results may not be optimal due to an inadequate volume of blood received in culture bottles   Culture   Final    NO GROWTH 5 DAYS Performed at [facility], [address] [place], [place] [address]    Report Status 07/02/2023 FINAL  Final  Blood culture (routine x 2)     Status: None   Collection Time: 06/27/23  7:56 PM   Specimen: BLOOD  Result Value Ref Range Status   Specimen Description BLOOD LEFT ARM  Final   Special Requests   Final    BOTTLES DRAWN AEROBIC AND ANAEROBIC Blood Culture results may not be optimal due to an inadequate volume of blood received in culture bottles   Culture   Final    NO GROWTH 5 DAYS Performed at [facility], [address], [place], [place] [address]    Report Status 07/02/2023 FINAL  Final     Time coordinating discharge: Over 30 minutes  SIGNED:   Anthony CHRISTELLA Pouch, MD  [facility] 07/02/2023, 1:02 PM Pager   If 7PM-7AM, please contact night-coverage [email or web address]

## 2023-07-02 NOTE — NC FL2 (Signed)
 [place]  MEDICAID [id number] LEVEL OF CARE FORM     IDENTIFICATION  Patient Name: Peter Robertson Birthdate: [birth year] Sex: male Admission Date (Current Location): 06/27/2023  [facility] and [place] Number:  [place] [id number] Facility and Address:  [facility], [address] [place], [place] [address]      Provider Number: [id number]  Attending Physician Name and Address:  Trudy Anthony HERO, MD  Relative Name and Phone Number:  [name] Ford [phone]    Current Level of Care: Hospital Recommended Level of Care:  (Long term Care with Hospice Following) Prior Approval Number:    Date Approved/Denied:   PASRR Number:    Discharge Plan:  (Long- term Care)    Current Diagnoses: Patient Active Problem List   Diagnosis Date Noted   UTI (urinary tract infection) 06/27/2023   Type II diabetes mellitus with renal manifestations (HCC) 06/27/2023   Acute renal failure superimposed on stage 3a chronic kidney disease (HCC) 06/27/2023   Sarcoidosis 06/27/2023   Sacral decubitus ulcer, stage III (HCC) 11/27/2022   Cellulitis of buttock 11/27/2022   Type 2 diabetes mellitus without complication, with long-term current use of insulin  (HCC) 11/27/2022   Protein-calorie malnutrition, severe 11/21/2022   Renal hematoma, left 11/19/2022   Chronic anticoagulation 11/18/2022   Infected decubitus ulcer 11/18/2022   Hydronephrosis of left kidney s/p nephrostomy tubes (4/2 - 10/03/2022) 11/18/2022   Frequent falls 11/18/2022   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 11/18/2022   Paroxysmal atrial fibrillation (HCC) 11/18/2022   Ureteropelvic junction (UPJ) obstruction 09/10/2022   (HFpEF) heart failure with preserved ejection fraction (HCC) 09/13/2021   Chronic diastolic (congestive) heart failure (HCC) 05/24/2021   Thyroid nodule greater than or equal to 1 cm in diameter incidentally noted on imaging study    Head injury    Weakness  generalized    Acute on chronic diastolic CHF (congestive heart failure) (HCC) 05/07/2021   Acute respiratory failure with hypoxia (HCC) 05/07/2021   Aspiration pneumonia (HCC) 05/06/2021   Pneumonia due to COVID-19 virus 05/06/2021   Dementia without behavioral disturbance (HCC) 05/06/2021   Fall at home, initial encounter 05/06/2021   Hyperlipidemia    Hypertension    Elevated troponin    Chronic diastolic CHF (congestive heart failure) (HCC)    Depression    Stage 3a chronic kidney disease (HCC)    Thyroid nodule    Fall    Encounter for psychological evaluation 02/15/2020   AKI (acute kidney injury) (HCC) 02/12/2020   Vertigo 10/11/2019   BPPV (benign paroxysmal positional vertigo) 10/10/2019   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) 10/10/2019   Uncontrolled type 2 diabetes mellitus with hyperglycemia, with long-term current use of insulin  (HCC) 10/10/2019   BPH (benign prostatic hyperplasia) 10/10/2019   Thrombocytopenia (HCC) 10/10/2019   Dark stools 10/10/2019   Unstable angina (HCC) 05/20/2018   Chest pain 01/30/2017    Orientation RESPIRATION BLADDER Height & Weight     Self, Time, Situation  Normal Incontinent Weight: 70.7 kg Height:  5' 6 (167.6 cm)  BEHAVIORAL SYMPTOMS/MOOD NEUROLOGICAL BOWEL NUTRITION STATUS      Incontinent  (See Discharge Summary)  AMBULATORY STATUS COMMUNICATION OF NEEDS Skin   Extensive Assist Verbally Normal                       Personal Care Assistance Level of Assistance  Bathing, Feeding, Dressing Bathing Assistance: Maximum assistance Feeding assistance: Limited assistance Dressing Assistance: Maximum assistance  Functional Limitations Info  Sight, Hearing, Speech Sight Info: Impaired (Patient wears glasses) Hearing Info: Adequate Speech Info: Adequate    SPECIAL CARE FACTORS FREQUENCY                       Contractures Contractures Info: Not present    Additional Factors Info   Allergies, Code Status Code Status Info: DNR-Limited Allergies Info: Sulfamethoxazole-trimethoprim, Tape           Current Medications (07/02/2023):  This is the current hospital active medication list Current Facility-Administered Medications  Medication Dose Route Frequency Provider Last Rate Last Admin   acetaminophen  (TYLENOL ) 160 MG/5ML solution 650 mg  650 mg Oral Q6H PRN Niu, Xilin, MD   650 mg at 06/29/23 0110   albuterol  (PROVENTIL ) (2.5 MG/3ML) 0.083% nebulizer solution 2.5 mg  2.5 mg Nebulization Q4H PRN Niu, Xilin, MD   2.5 mg at 06/30/23 1536   atorvastatin  (LIPITOR) tablet 20 mg  20 mg Oral QAC supper Niu, Xilin, MD   20 mg at 07/01/23 1631   benzonatate  (TESSALON ) capsule 200 mg  200 mg Oral BID Trudy Anthony HERO, MD   200 mg at 07/02/23 9141   dextromethorphan -guaiFENesin  (MUCINEX  DM) 30-600 MG per 12 hr tablet 1 tablet  1 tablet Oral BID PRN Niu, Xilin, MD   1 tablet at 07/02/23 0857   heparin  injection 5,000 Units  5,000 Units Subcutaneous Q8H Niu, Xilin, MD   5,000 Units at 07/02/23 0547   hydrALAZINE  (APRESOLINE ) injection 5 mg  5 mg Intravenous Q2H PRN Niu, Xilin, MD       insulin  aspart (novoLOG ) injection 0-5 Units  0-5 Units Subcutaneous QHS Niu, Xilin, MD   2 Units at 06/30/23 2235   insulin  aspart (novoLOG ) injection 0-9 Units  0-9 Units Subcutaneous TID WC Niu, Xilin, MD   2 Units at 07/01/23 1632   insulin  aspart (novoLOG ) injection 4 Units  4 Units Subcutaneous TID WC Trudy Anthony HERO, MD   4 Units at 07/02/23 0854   insulin  glargine-yfgn (SEMGLEE ) injection 20 Units  20 Units Subcutaneous Daily Trudy Anthony HERO, MD   20 Units at 07/02/23 0943   methylPREDNISolone  sodium succinate  (SOLU-MEDROL ) 40 mg/mL injection 40 mg  40 mg Intravenous Q24H Trudy Anthony HERO, MD   40 mg at 07/02/23 9145   metoprolol  succinate (TOPROL -XL) 24 hr tablet 50 mg  50 mg Oral Daily Williams, Jamiese M, MD   50 mg at 07/02/23 0858   ondansetron  (ZOFRAN ) injection 4 mg  4 mg  Intravenous Q8H PRN Niu, Xilin, MD   4 mg at 07/01/23 1439   oxyCODONE  (Oxy IR/ROXICODONE ) immediate release tablet 5 mg  5 mg Oral Q4H PRN Niu, Xilin, MD   5 mg at 07/01/23 2200   pantoprazole  (PROTONIX ) EC tablet 40 mg  40 mg Oral Daily Niu, Xilin, MD   40 mg at 07/02/23 0857   sertraline  (ZOLOFT ) tablet 50 mg  50 mg Oral Daily Niu, Xilin, MD   50 mg at 07/02/23 9141   tamsulosin  (FLOMAX ) capsule 0.8 mg  0.8 mg Oral QHS Madueme, Elvira C, RPH   0.8 mg at 07/01/23 2159     Discharge Medications: Please see discharge summary for a list of discharge medications.  Relevant Imaging Results:  Relevant Lab Results:   Additional Information [id number]  Letia Guidry A Emori Mumme, RN

## 2023-07-02 NOTE — TOC Transition Note (Addendum)
 Transition of Care [facility]) - Discharge Note   Patient Details  Name: Peter Robertson MRN: [id number] Date of Birth: 29-Oct-1940  [facility] CM/SW Contact:  Aubry Rankin A [name] Cumpton, RN Phone Number: 07/02/2023, 11:22 AM   Clinical Narrative:    Chart reviewed.  Noted that patient has orders for discharge today.  I have spoken with Barnie, Admission Coordinator with [facility].  She informs me that patient is able to return back to the facility today.  I have informed Barnie that [facility] will be following patient at the facility.  I have made Lonell aware with [facility] that patient will be a discharge for today.  Barnie reports that patient will be going to room 124 B and the number to call report is [phone].    I have made [name] Ford aware that patient will be going back to [facility] today.    I have arranged [facility] for transport to [facility] today.    I have informed staff nurse of the above information.         Final next level of care:  (Long-term Care with Hospice following) Barriers to Discharge: No Barriers Identified   Patient Goals and CMS Choice   CMS Medicare.gov Compare Post Acute Care list provided to:: Patient Choice offered to / list presented to : Patient      Discharge Placement              Patient chooses bed at: [facility]   Name of family member notified: [name] Ford Patient and family notified of of transfer: 07/02/23  Discharge Plan and Services Additional resources added to the After Visit Summary for                                       Social Drivers of Health (SDOH) Interventions SDOH Screenings   Food Insecurity: No Food Insecurity (06/29/2023)  Housing: Low Risk  (06/29/2023)  Transportation Needs: No Transportation Needs (06/29/2023)  Utilities: Not At Risk (06/29/2023)  Financial Resource Strain: Low Risk  (07/12/2022)   Received from [facility], [facility]  Physical Activity: Insufficiently Active (08/26/2020)   Received from [facility], [facility]  Social Connections: Moderately Isolated (06/29/2023)  Stress: Stress Concern Present (08/26/2020)   Received from [facility], [facility]  Tobacco Use: Medium Risk (07/01/2023)  Health Literacy: High Risk (08/11/2022)   Received from [facility], [facility]     Readmission Risk Interventions     No data to display

## 2023-07-02 NOTE — Plan of Care (Signed)

## 2023-07-03 LAB — BASIC METABOLIC PANEL
Anion gap: 6 (ref 5–15)
BUN: 75 mg/dL — ABNORMAL HIGH (ref 8–23)
CO2: 23 mmol/L (ref 22–32)
Calcium: 8.5 mg/dL — ABNORMAL LOW (ref 8.9–10.3)
Chloride: 104 mmol/L (ref 98–111)
Creatinine, Ser: 2.18 mg/dL — ABNORMAL HIGH (ref 0.61–1.24)
GFR, Estimated: 29 mL/min — ABNORMAL LOW (ref >60)
Glucose, Bld: 144 mg/dL — ABNORMAL HIGH (ref 70–99)
Potassium: 4.6 mmol/L (ref 3.5–5.1)
Sodium: 133 mmol/L — ABNORMAL LOW (ref 135–145)

## 2023-07-03 LAB — CBC
HCT: 29.2 % — ABNORMAL LOW (ref 39.0–52.0)
Hemoglobin: 9.8 g/dL — ABNORMAL LOW (ref 13.0–17.0)
MCH: 28.9 pg (ref 26.0–34.0)
MCHC: 33.6 g/dL (ref 30.0–36.0)
MCV: 86.1 fL (ref 80.0–100.0)
Platelets: 115 10*3/uL — ABNORMAL LOW (ref 150–400)
RBC: 3.39 MIL/uL — ABNORMAL LOW (ref 4.22–5.81)
RDW: 15 % (ref 11.5–15.5)
WBC: 6.2 10*3/uL (ref 4.0–10.5)
nRBC: 0 % (ref 0.0–0.2)

## 2023-07-03 LAB — GLUCOSE, CAPILLARY
Glucose-Capillary: 150 mg/dL — ABNORMAL HIGH (ref 70–99)
Glucose-Capillary: 118 mg/dL — ABNORMAL HIGH (ref 70–99)

## 2023-07-03 MED ORDER — ONDANSETRON 4 MG PO TBDP
8.0000 mg | ORAL_TABLET | Freq: Once | ORAL | Status: AC
  Administered 2023-07-03: 8 mg via ORAL
  Filled 2023-07-03: qty 2

## 2023-07-03 NOTE — Care Management Important Message (Signed)
 Important Message  Patient Details  Name: Peter Robertson MRN: [id number] Date of Birth: 1940-11-13   Important Message Given:  Yes - Medicare IM     Cristela Blue, CMA 07/03/2023, 9:16 AM

## 2023-07-03 NOTE — Plan of Care (Signed)
 Pt's cognitive status does not allow for retention of education provided.  Problem: Education: Goal: Knowledge of General Education information will improve Description: Including pain rating scale, medication(s)/side effects and non-pharmacologic comfort measures Outcome: Not Progressing ----------------------------------------------------------------------------------  Problem: Education: Goal: Ability to describe self-care measures that may prevent or decrease complications (Diabetes Survival Skills Education) will improve Outcome: Progressing   Problem: Coping: Goal: Ability to adjust to condition or change in health will improve Outcome: Progressing   Problem: Fluid Volume: Goal: Ability to maintain a balanced intake and output will improve Outcome: Progressing   Problem: Health Behavior/Discharge Planning: Goal: Ability to identify and utilize available resources and services will improve Outcome: Progressing Goal: Ability to manage health-related needs will improve Outcome: Progressing   Problem: Metabolic: Goal: Ability to maintain appropriate glucose levels will improve Outcome: Progressing   Problem: Nutritional: Goal: Maintenance of adequate nutrition will improve Outcome: Progressing Goal: Progress toward achieving an optimal weight will improve Outcome: Progressing   Problem: Skin Integrity: Goal: Risk for impaired skin integrity will decrease Outcome: Progressing   Problem: Tissue Perfusion: Goal: Adequacy of tissue perfusion will improve Outcome: Progressing   Problem: Health Behavior/Discharge Planning: Goal: Ability to manage health-related needs will improve Outcome: Progressing   Problem: Clinical Measurements: Goal: Ability to maintain clinical measurements within normal limits will improve Outcome: Progressing Goal: Will remain free from infection Outcome: Progressing Goal: Diagnostic test results will improve Outcome: Progressing Goal:  Respiratory complications will improve Outcome: Progressing Goal: Cardiovascular complication will be avoided Outcome: Progressing   Problem: Activity: Goal: Risk for activity intolerance will decrease Outcome: Progressing   Problem: Nutrition: Goal: Adequate nutrition will be maintained Outcome: Progressing   Problem: Coping: Goal: Level of anxiety will decrease Outcome: Progressing   Problem: Elimination: Goal: Will not experience complications related to bowel motility Outcome: Progressing Goal: Will not experience complications related to urinary retention Outcome: Progressing   Problem: Pain Management: Goal: General experience of comfort will improve Outcome: Progressing   Problem: Safety: Goal: Ability to remain free from injury will improve Outcome: Progressing   Problem: Skin Integrity: Goal: Risk for impaired skin integrity will decrease Outcome: Progressing

## 2023-07-03 NOTE — TOC Transition Note (Signed)
 Transition of Care [facility]) - Discharge Note   Patient Details  Name: Peter Robertson MRN: [id number] Date of Birth: 1941/06/01  Transition of Care [facility]) CM/SW Contact:  Vollie Brunty A Trevyn Lumpkin, RN Phone Number: 07/03/2023, 9:51 AM   Clinical Narrative:    Chart reviewed.  Noted that patient has orders for discharge today.  I have spoken with Barnie from [facility] and informed her that EMS was not able to transport patient to the facility late yesterday night.  I have informed Barnie that patient is stable for discharge today.  Barnie reports that patient will go to room 124 B and the number to call report is [phone].  I have sent Barnie patient's SNF Transfer Report, Discharge Orders, and Discharge Summary via Epic hub.    I have made [name] Ford aware that patient will be discharging to [facility] today.  I have informed Mrs. Ford that [facility] will transport patient to the facility today.  I have informed Lonell with [facility] that patient will be a discharge for today.  I have arranged EMS transport with [facility] to transport patient to [facility].    I have informed staff nurse of the above information.    Final next level of care: Skilled Nursing Facility Barriers to Discharge: No Barriers Identified   Patient Goals and CMS Choice   CMS Medicare.gov Compare Post Acute Care list provided to:: Patient Choice offered to / list presented to : Patient      Discharge Placement              Patient chooses bed at: [facility] Patient to be transferred to facility by: [facility] Name of family member notified: [name] Ford Patient and family notified of of transfer: 07/03/23  Discharge Plan and Services Additional resources added to the After Visit Summary for                                       Social Drivers of Health (SDOH) Interventions SDOH Screenings   Food Insecurity: No Food  Insecurity (06/29/2023)  Housing: Low Risk  (06/29/2023)  Transportation Needs: No Transportation Needs (06/29/2023)  Utilities: Not At Risk (06/29/2023)  Financial Resource Strain: Low Risk  (07/12/2022)   Received from [facility], [facility]  Physical Activity: Insufficiently Active (08/26/2020)   [facility], [facility]  Social Connections: Moderately Isolated (06/29/2023)  Stress: Stress Concern Present (08/26/2020)   [facility] from [facility], [facility]  Tobacco Use: Medium Risk (07/01/2023)  Health Literacy: High Risk (08/11/2022)   Received from [facility], [facility]     Readmission Risk Interventions     No data to display

## 2023-07-03 NOTE — Progress Notes (Signed)
 EMS refused to take pt due to unequal pupil sizes.  EMS needs a physician to assess pt before transporting.  Documentation from 06/28/23 said that pupils were equal, round and reactive.  However, when the on-call provider went back to 2021 in the pt's chart, pupils were noted to be unequal in size.

## 2023-07-03 NOTE — Plan of Care (Signed)

## 2023-07-18 ENCOUNTER — Encounter: Payer: Self-pay | Admitting: Emergency Medicine

## 2023-07-18 ENCOUNTER — Other Ambulatory Visit: Payer: Self-pay

## 2023-07-18 ENCOUNTER — Emergency Department: Payer: Medicare PPO

## 2023-07-18 ENCOUNTER — Inpatient Hospital Stay
Admission: EM | Admit: 2023-07-18 | Discharge: 2023-07-24 | DRG: 871 | Disposition: A | Discharge status: Skilled Nursing Facility | Payer: Medicare PPO | Source: Skilled Nursing Facility | Attending: Internal Medicine | Admitting: Internal Medicine

## 2023-07-18 ENCOUNTER — Inpatient Hospital Stay: Payer: Medicare PPO

## 2023-07-18 DIAGNOSIS — I5032 Chronic diastolic (congestive) heart failure: Secondary | ICD-10-CM | POA: Diagnosis present

## 2023-07-18 DIAGNOSIS — R680 Hypothermia, not associated with low environmental temperature: Secondary | ICD-10-CM | POA: Diagnosis present

## 2023-07-18 DIAGNOSIS — Z515 Encounter for palliative care: Secondary | ICD-10-CM | POA: Diagnosis not present

## 2023-07-18 DIAGNOSIS — D696 Thrombocytopenia, unspecified: Secondary | ICD-10-CM | POA: Diagnosis present

## 2023-07-18 DIAGNOSIS — I700 Atherosclerosis of aorta: Secondary | ICD-10-CM | POA: Diagnosis present

## 2023-07-18 DIAGNOSIS — N200 Calculus of kidney: Secondary | ICD-10-CM | POA: Diagnosis present

## 2023-07-18 DIAGNOSIS — Z809 Family history of malignant neoplasm, unspecified: Secondary | ICD-10-CM

## 2023-07-18 DIAGNOSIS — Z8249 Family history of ischemic heart disease and other diseases of the circulatory system: Secondary | ICD-10-CM

## 2023-07-18 DIAGNOSIS — E1129 Type 2 diabetes mellitus with other diabetic kidney complication: Secondary | ICD-10-CM | POA: Diagnosis present

## 2023-07-18 DIAGNOSIS — N1832 Chronic kidney disease, stage 3b: Secondary | ICD-10-CM | POA: Diagnosis present

## 2023-07-18 DIAGNOSIS — Y738 Miscellaneous gastroenterology and urology devices associated with adverse incidents, not elsewhere classified: Secondary | ICD-10-CM | POA: Diagnosis not present

## 2023-07-18 DIAGNOSIS — R6521 Severe sepsis with septic shock: Principal | ICD-10-CM | POA: Diagnosis present

## 2023-07-18 DIAGNOSIS — M25572 Pain in left ankle and joints of left foot: Secondary | ICD-10-CM | POA: Diagnosis present

## 2023-07-18 DIAGNOSIS — I2510 Atherosclerotic heart disease of native coronary artery without angina pectoris: Secondary | ICD-10-CM | POA: Diagnosis present

## 2023-07-18 DIAGNOSIS — T83031A Leakage of indwelling urethral catheter, initial encounter: Secondary | ICD-10-CM | POA: Diagnosis not present

## 2023-07-18 DIAGNOSIS — N3000 Acute cystitis without hematuria: Secondary | ICD-10-CM | POA: Diagnosis not present

## 2023-07-18 DIAGNOSIS — Z794 Long term (current) use of insulin: Secondary | ICD-10-CM

## 2023-07-18 DIAGNOSIS — E871 Hypo-osmolality and hyponatremia: Secondary | ICD-10-CM | POA: Diagnosis present

## 2023-07-18 DIAGNOSIS — E785 Hyperlipidemia, unspecified: Secondary | ICD-10-CM | POA: Diagnosis present

## 2023-07-18 DIAGNOSIS — N400 Enlarged prostate without lower urinary tract symptoms: Secondary | ICD-10-CM | POA: Diagnosis present

## 2023-07-18 DIAGNOSIS — A419 Sepsis, unspecified organism: Secondary | ICD-10-CM | POA: Diagnosis not present

## 2023-07-18 DIAGNOSIS — N1831 Chronic kidney disease, stage 3a: Secondary | ICD-10-CM

## 2023-07-18 DIAGNOSIS — E1165 Type 2 diabetes mellitus with hyperglycemia: Secondary | ICD-10-CM | POA: Diagnosis present

## 2023-07-18 DIAGNOSIS — D869 Sarcoidosis, unspecified: Secondary | ICD-10-CM | POA: Diagnosis present

## 2023-07-18 DIAGNOSIS — Z1152 Encounter for screening for COVID-19: Secondary | ICD-10-CM | POA: Diagnosis not present

## 2023-07-18 DIAGNOSIS — N136 Pyonephrosis: Secondary | ICD-10-CM | POA: Diagnosis present

## 2023-07-18 DIAGNOSIS — E861 Hypovolemia: Secondary | ICD-10-CM | POA: Diagnosis present

## 2023-07-18 DIAGNOSIS — Z951 Presence of aortocoronary bypass graft: Secondary | ICD-10-CM

## 2023-07-18 DIAGNOSIS — N390 Urinary tract infection, site not specified: Secondary | ICD-10-CM | POA: Diagnosis present

## 2023-07-18 DIAGNOSIS — I10 Essential (primary) hypertension: Secondary | ICD-10-CM | POA: Diagnosis not present

## 2023-07-18 DIAGNOSIS — N179 Acute kidney failure, unspecified: Secondary | ICD-10-CM | POA: Diagnosis present

## 2023-07-18 DIAGNOSIS — E860 Dehydration: Secondary | ICD-10-CM | POA: Diagnosis present

## 2023-07-18 DIAGNOSIS — Z66 Do not resuscitate: Secondary | ICD-10-CM | POA: Diagnosis present

## 2023-07-18 DIAGNOSIS — I480 Paroxysmal atrial fibrillation: Secondary | ICD-10-CM | POA: Diagnosis present

## 2023-07-18 DIAGNOSIS — F32A Depression, unspecified: Secondary | ICD-10-CM | POA: Diagnosis present

## 2023-07-18 DIAGNOSIS — I252 Old myocardial infarction: Secondary | ICD-10-CM

## 2023-07-18 DIAGNOSIS — F0393 Unspecified dementia, unspecified severity, with mood disturbance: Secondary | ICD-10-CM | POA: Diagnosis present

## 2023-07-18 DIAGNOSIS — E875 Hyperkalemia: Secondary | ICD-10-CM | POA: Diagnosis present

## 2023-07-18 DIAGNOSIS — J45909 Unspecified asthma, uncomplicated: Secondary | ICD-10-CM | POA: Diagnosis present

## 2023-07-18 DIAGNOSIS — N184 Chronic kidney disease, stage 4 (severe): Secondary | ICD-10-CM | POA: Diagnosis present

## 2023-07-18 DIAGNOSIS — Z91048 Other nonmedicinal substance allergy status: Secondary | ICD-10-CM

## 2023-07-18 DIAGNOSIS — E876 Hypokalemia: Secondary | ICD-10-CM | POA: Diagnosis present

## 2023-07-18 DIAGNOSIS — Z882 Allergy status to sulfonamides status: Secondary | ICD-10-CM

## 2023-07-18 DIAGNOSIS — Z7984 Long term (current) use of oral hypoglycemic drugs: Secondary | ICD-10-CM

## 2023-07-18 DIAGNOSIS — Z87891 Personal history of nicotine dependence: Secondary | ICD-10-CM

## 2023-07-18 DIAGNOSIS — E1122 Type 2 diabetes mellitus with diabetic chronic kidney disease: Secondary | ICD-10-CM | POA: Diagnosis present

## 2023-07-18 DIAGNOSIS — Z833 Family history of diabetes mellitus: Secondary | ICD-10-CM

## 2023-07-18 DIAGNOSIS — I130 Hypertensive heart and chronic kidney disease with heart failure and stage 1 through stage 4 chronic kidney disease, or unspecified chronic kidney disease: Secondary | ICD-10-CM | POA: Diagnosis present

## 2023-07-18 DIAGNOSIS — I5A Non-ischemic myocardial injury (non-traumatic): Secondary | ICD-10-CM | POA: Diagnosis present

## 2023-07-18 DIAGNOSIS — Z87442 Personal history of urinary calculi: Secondary | ICD-10-CM

## 2023-07-18 DIAGNOSIS — Z79899 Other long term (current) drug therapy: Secondary | ICD-10-CM

## 2023-07-18 DIAGNOSIS — F0390 Unspecified dementia without behavioral disturbance: Secondary | ICD-10-CM | POA: Diagnosis present

## 2023-07-18 DIAGNOSIS — I4891 Unspecified atrial fibrillation: Secondary | ICD-10-CM | POA: Diagnosis present

## 2023-07-18 LAB — CBG MONITORING, ED
Glucose-Capillary: 379 mg/dL — ABNORMAL HIGH (ref 70–99)
Glucose-Capillary: 374 mg/dL — ABNORMAL HIGH (ref 70–99)

## 2023-07-18 LAB — COMPREHENSIVE METABOLIC PANEL
ALT: 16 U/L (ref 0–44)
AST: 10 U/L — ABNORMAL LOW (ref 15–41)
Albumin: 2.7 g/dL — ABNORMAL LOW (ref 3.5–5.0)
Alkaline Phosphatase: 55 U/L (ref 38–126)
Anion gap: 12 (ref 5–15)
BUN: 152 mg/dL — ABNORMAL HIGH (ref 8–23)
CO2: 14 mmol/L — ABNORMAL LOW (ref 22–32)
Calcium: 8.2 mg/dL — ABNORMAL LOW (ref 8.9–10.3)
Chloride: 96 mmol/L — ABNORMAL LOW (ref 98–111)
Creatinine, Ser: 4.15 mg/dL — ABNORMAL HIGH (ref 0.61–1.24)
GFR, Estimated: 14 mL/min — ABNORMAL LOW (ref >60)
Glucose, Bld: 399 mg/dL — ABNORMAL HIGH (ref 70–99)
Potassium: 5.9 mmol/L — ABNORMAL HIGH (ref 3.5–5.1)
Sodium: 122 mmol/L — ABNORMAL LOW (ref 135–145)
Total Bilirubin: 0.5 mg/dL (ref 0.0–1.2)
Total Protein: 5.5 g/dL — ABNORMAL LOW (ref 6.5–8.1)

## 2023-07-18 LAB — CBC WITH DIFFERENTIAL/PLATELET
Abs Immature Granulocytes: 0.06 10*3/uL (ref 0.00–0.07)
Basophils Absolute: 0 10*3/uL (ref 0.0–0.1)
Basophils Relative: 0 %
Eosinophils Absolute: 0 10*3/uL (ref 0.0–0.5)
Eosinophils Relative: 0 %
HCT: 26.7 % — ABNORMAL LOW (ref 39.0–52.0)
Hemoglobin: 9.6 g/dL — ABNORMAL LOW (ref 13.0–17.0)
Immature Granulocytes: 1 %
Lymphocytes Relative: 3 %
Lymphs Abs: 0.3 10*3/uL — ABNORMAL LOW (ref 0.7–4.0)
MCH: 29.5 pg (ref 26.0–34.0)
MCHC: 36 g/dL (ref 30.0–36.0)
MCV: 82.2 fL (ref 80.0–100.0)
Monocytes Absolute: 1 10*3/uL (ref 0.1–1.0)
Monocytes Relative: 10 %
Neutro Abs: 8.4 10*3/uL — ABNORMAL HIGH (ref 1.7–7.7)
Neutrophils Relative %: 86 %
Platelets: 76 10*3/uL — ABNORMAL LOW (ref 150–400)
RBC: 3.25 MIL/uL — ABNORMAL LOW (ref 4.22–5.81)
RDW: 14.9 % (ref 11.5–15.5)
WBC: 9.7 10*3/uL (ref 4.0–10.5)
nRBC: 0 % (ref 0.0–0.2)

## 2023-07-18 LAB — RESP PANEL BY RT-PCR (RSV, FLU A&B, COVID)  RVPGX2
Influenza A by PCR: NEGATIVE
Influenza B by PCR: NEGATIVE
Resp Syncytial Virus by PCR: NEGATIVE
SARS Coronavirus 2 by RT PCR: NEGATIVE

## 2023-07-18 LAB — BASIC METABOLIC PANEL
Anion gap: 9 (ref 5–15)
BUN: 138 mg/dL — ABNORMAL HIGH (ref 8–23)
CO2: 14 mmol/L — ABNORMAL LOW (ref 22–32)
Calcium: 7.6 mg/dL — ABNORMAL LOW (ref 8.9–10.3)
Chloride: 102 mmol/L (ref 98–111)
Creatinine, Ser: 3.65 mg/dL — ABNORMAL HIGH (ref 0.61–1.24)
GFR, Estimated: 16 mL/min — ABNORMAL LOW (ref >60)
Glucose, Bld: 362 mg/dL — ABNORMAL HIGH (ref 70–99)
Potassium: 6 mmol/L — ABNORMAL HIGH (ref 3.5–5.1)
Sodium: 125 mmol/L — ABNORMAL LOW (ref 135–145)

## 2023-07-18 LAB — BRAIN NATRIURETIC PEPTIDE: B Natriuretic Peptide: 56.5 pg/mL (ref 0.0–100.0)

## 2023-07-18 LAB — PROTIME-INR
INR: 1 (ref 0.8–1.2)
Prothrombin Time: 13.4 s (ref 11.4–15.2)

## 2023-07-18 LAB — TROPONIN I (HIGH SENSITIVITY)
Troponin I (High Sensitivity): 54 ng/L — ABNORMAL HIGH (ref <18)
Troponin I (High Sensitivity): 45 ng/L — ABNORMAL HIGH (ref <18)

## 2023-07-18 LAB — LACTIC ACID, PLASMA
Lactic Acid, Venous: 2.5 mmol/L (ref 0.5–1.9)
Lactic Acid, Venous: 1.1 mmol/L (ref 0.5–1.9)

## 2023-07-18 LAB — APTT: aPTT: 30 s (ref 24–36)

## 2023-07-18 LAB — PROCALCITONIN: Procalcitonin: <0.1 ng/mL

## 2023-07-18 LAB — MAGNESIUM: Magnesium: 1.9 mg/dL (ref 1.7–2.4)

## 2023-07-18 LAB — PHOSPHORUS: Phosphorus: 4.1 mg/dL (ref 2.5–4.6)

## 2023-07-18 LAB — BLOOD GAS, VENOUS

## 2023-07-18 MED ORDER — MIDODRINE HCL 5 MG PO TABS
10.0000 mg | ORAL_TABLET | Freq: Three times a day (TID) | ORAL | Status: DC
  Administered 2023-07-18 – 2023-07-24 (×19): 10 mg via ORAL
  Filled 2023-07-18 (×19): qty 2

## 2023-07-18 MED ORDER — SODIUM CHLORIDE 0.9 % IV SOLN
2.0000 g | Freq: Once | INTRAVENOUS | Status: AC
  Administered 2023-07-18: 2 g via INTRAVENOUS
  Filled 2023-07-18: qty 12.5

## 2023-07-18 MED ORDER — DM-GUAIFENESIN ER 30-600 MG PO TB12
1.0000 | ORAL_TABLET | Freq: Two times a day (BID) | ORAL | Status: DC | PRN
  Administered 2023-07-18: 1 via ORAL
  Filled 2023-07-18: qty 1

## 2023-07-18 MED ORDER — SODIUM CHLORIDE 0.9 % IV BOLUS (SEPSIS)
250.0000 mL | Freq: Once | INTRAVENOUS | Status: AC
  Administered 2023-07-18: 250 mL via INTRAVENOUS

## 2023-07-18 MED ORDER — ALBUMIN HUMAN 25 % IV SOLN
25.0000 g | Freq: Once | INTRAVENOUS | Status: AC
  Administered 2023-07-18: 25 g via INTRAVENOUS
  Filled 2023-07-18: qty 100

## 2023-07-18 MED ORDER — ALBUTEROL SULFATE (2.5 MG/3ML) 0.083% IN NEBU
2.5000 mg | INHALATION_SOLUTION | RESPIRATORY_TRACT | Status: DC | PRN
  Administered 2023-07-18 – 2023-07-19 (×2): 2.5 mg via RESPIRATORY_TRACT
  Filled 2023-07-18 (×2): qty 3

## 2023-07-18 MED ORDER — SODIUM CHLORIDE 0.9 % IV BOLUS (SEPSIS)
1000.0000 mL | Freq: Once | INTRAVENOUS | Status: AC
  Administered 2023-07-18: 1000 mL via INTRAVENOUS

## 2023-07-18 MED ORDER — HYDROCORTISONE SOD SUC (PF) 100 MG IJ SOLR
100.0000 mg | Freq: Once | INTRAMUSCULAR | Status: AC
  Administered 2023-07-18: 100 mg via INTRAVENOUS
  Filled 2023-07-18: qty 2

## 2023-07-18 MED ORDER — SODIUM CHLORIDE 0.9 % IV BOLUS
1000.0000 mL | Freq: Once | INTRAVENOUS | Status: AC
  Administered 2023-07-18: 1000 mL via INTRAVENOUS

## 2023-07-18 MED ORDER — SODIUM ZIRCONIUM CYCLOSILICATE 10 G PO PACK
10.0000 g | PACK | Freq: Once | ORAL | Status: AC
  Administered 2023-07-18: 10 g via ORAL
  Filled 2023-07-18: qty 1

## 2023-07-18 MED ORDER — SODIUM CHLORIDE 0.9 % IV BOLUS
500.0000 mL | Freq: Once | INTRAVENOUS | Status: AC
  Administered 2023-07-18: 500 mL via INTRAVENOUS

## 2023-07-18 MED ORDER — INSULIN ASPART 100 UNIT/ML IJ SOLN
0.0000 [IU] | Freq: Three times a day (TID) | INTRAMUSCULAR | Status: DC
  Administered 2023-07-19: 3 [IU] via SUBCUTANEOUS
  Administered 2023-07-19: 2 [IU] via SUBCUTANEOUS
  Administered 2023-07-19 – 2023-07-20 (×2): 3 [IU] via SUBCUTANEOUS
  Administered 2023-07-20: 7 [IU] via SUBCUTANEOUS
  Administered 2023-07-21 (×2): 2 [IU] via SUBCUTANEOUS
  Administered 2023-07-22: 5 [IU] via SUBCUTANEOUS
  Administered 2023-07-22: 3 [IU] via SUBCUTANEOUS
  Administered 2023-07-23 – 2023-07-24 (×2): 2 [IU] via SUBCUTANEOUS
  Filled 2023-07-18 (×11): qty 1

## 2023-07-18 MED ORDER — INSULIN ASPART 100 UNIT/ML IJ SOLN
0.0000 [IU] | Freq: Every day | INTRAMUSCULAR | Status: DC
  Administered 2023-07-18: 5 [IU] via SUBCUTANEOUS
  Filled 2023-07-18: qty 1

## 2023-07-18 MED ORDER — ONDANSETRON HCL 4 MG/2ML IJ SOLN
4.0000 mg | Freq: Three times a day (TID) | INTRAMUSCULAR | Status: DC | PRN
  Administered 2023-07-18 – 2023-07-22 (×2): 4 mg via INTRAVENOUS
  Filled 2023-07-18 (×2): qty 2

## 2023-07-18 MED ORDER — SODIUM CHLORIDE 0.9 % IV SOLN: INTRAVENOUS | Status: DC

## 2023-07-18 MED ORDER — ACETAMINOPHEN 325 MG PO TABS
650.0000 mg | ORAL_TABLET | Freq: Four times a day (QID) | ORAL | Status: DC | PRN
  Administered 2023-07-18 – 2023-07-24 (×3): 650 mg via ORAL
  Filled 2023-07-18 (×2): qty 2

## 2023-07-18 MED ORDER — VANCOMYCIN HCL IN DEXTROSE 1-5 GM/200ML-% IV SOLN
1000.0000 mg | Freq: Once | INTRAVENOUS | Status: AC
  Administered 2023-07-18: 1000 mg via INTRAVENOUS
  Filled 2023-07-18: qty 200

## 2023-07-18 NOTE — H&P (Signed)
History and Physical    Peter Robertson ZOX:[id number] DOB: 05-13-1941 DOA: 07/18/2023  Referring MD/NP/PA:   PCP: System, Provider Not In   Patient coming from:  The patient is coming from SNF    Chief Complaint: weakness, cough, elevated blood sugar  HPI: Peter Robertson is a 83 y.o. male with medical history significant of hypertension, hyperlipidemia, diabetes mellitus, asthma, GERD, CAD, myocardial infarction, CKD-3A, BPH, BPV, vertigo, thrombocytopenia, dementia, dCHF, kidney stone, A-fib not on anticoagulants, who presents with weakness.  Per report, pt was noted to have a "junky" cough by staff in SNF. His glucose is elevated to 519 per EMS.  His mental status is at baseline.  When I saw patient in ED, he is alert, answered most questions appropriately.  He denies any symptoms except for feeling weak to me.  Patient denies cough, SOB, nausea, vomiting, diarrhea, abdominal pain, chest pain, fever, chills.  Denies symptoms of UTI.  Patient was found to have hypotension with blood pressure down to 69/49, aggressive IV fluid resuscitation was started.  Totally 3.75 L of normal saline was given.  Also started midodrine 10 mg 3 times daily, Solu-Medrol 100 mg and albumin 25 g. Blood pressure finally improved to SBP of  90s.   Data reviewed independently and ED Course: pt was found to have WBC 9.7, urinalysis (turbid appearance, moderate amount leukocyte, many bacteria, WBC> 50).  Negative PCR for flu, RSV and COVID, troponin level 54 --> 45, lactic acid 2.5 --> 1.1, sodium 122, potassium 5.9, magnesium 1.9, phosphorus of 4.1, worsening renal function with creatinine 4.15, BUN 152, GFR 14 (recent baseline creatinine 2.18 on 07/03/2023).  VBG with pH 7.22, CO2 38, O2<31.  Temperature 95.7, heart rate 95, RR 24, oxygen saturation 94-100% on room air.  Chest x-ray negative.  Patient is admitted to stepdown as inpatient.  CT-renal stone: 1. Moderate bilateral hydroureteronephrosis, progressed on the  left and new on the right from prior exam. Both ureters are dilated to the bladder insertion. No renal or ureteral calculi. 2. Irregular thick walled wall thickening of the bladder dome, query chronic bladder outlet obstruction. Infection or neoplasm also considered. 3. Cholelithiasis without cholecystitis. 4. Chronic calcified pleural plaques. Small chronic right pleural effusion. 5. Sequela of chronic pancreatitis. 6. Chronic splenomegaly.   Aortic Atherosclerosis (ICD10-I70.0).    EKG: I have personally reviewed.  Sinus rhythm, QTc 475, RAD, right bundle blockade, early R wave progression.   Review of Systems:   General: no fevers, chills, no body weight gain, has fatigue HEENT: no blurry vision, hearing changes or sore throat Respiratory: no dyspnea, coughing, wheezing CV: no chest pain, no palpitations GI: no nausea, vomiting, abdominal pain, diarrhea, constipation GU: no dysuria, burning on urination, increased urinary frequency, hematuria  Ext: no leg edema Neuro: no unilateral weakness, numbness, or tingling, no vision change or hearing loss Skin: no rash, no skin tear. MSK: No muscle spasm, no deformity, no limitation of range of movement in spin Heme: No easy bruising.  Travel history: No recent long distant travel.   Allergy:  Allergies  Allergen Reactions   Sulfamethoxazole-Trimethoprim Other (See Comments)    dizziness dizziness dizziness    Tape Rash    blisters    Past Medical History:  Diagnosis Date   Asthma    BPH (benign prostatic hyperplasia)    Chronic kidney disease    Kidney Stones   Coronary artery disease    Diabetes mellitus without complication (HCC)    Heart murmur  Hyperlipidemia    Hypertension    Myocardial infarction [facility])     Past Surgical History:  Procedure Laterality Date   COLONOSCOPY WITH PROPOFOL N/A 12/03/2014   Procedure: COLONOSCOPY WITH PROPOFOL;  Surgeon: Scot Jun, MD;  Location: [facility] ENDOSCOPY;   Service: Endoscopy;  Laterality: N/A;   CORONARY ARTERY BYPASS GRAFT     LEFT HEART CATH AND CORS/GRAFTS ANGIOGRAPHY N/A 05/22/2018   Procedure: LEFT HEART CATH AND CORS/GRAFTS ANGIOGRAPHY poss PCI;  Surgeon: Antonieta Iba, MD;  Location: [facility] INVASIVE CV LAB;  Service: Cardiovascular;  Laterality: N/A;    Social History:  reports that he quit smoking about 39 years ago. He has never used smokeless tobacco. He reports that he does not drink alcohol and does not use drugs.  Family History:  Family History  Problem Relation Age of Onset   Diabetes Mother    Cancer Mother    Hypertension Father    Hypertension Brother    Diabetes Brother      Prior to Admission medications   Medication Sig Start Date End Date Taking? Authorizing Provider  acetaminophen (TYLENOL) 325 MG tablet Take 2 tablets (650 mg total) by mouth every 6 (six) hours as needed for mild pain or fever. 05/17/21   Leeroy Bock, MD  albuterol (PROVENTIL) (2.5 MG/3ML) 0.083% nebulizer solution Take 2.5 mg by nebulization every 4 (four) hours as needed.    [provider]  albuterol (VENTOLIN HFA) 108 (90 Base) MCG/ACT inhaler Inhale 1-2 puffs into the lungs every 4 (four) hours as needed.    [provider]  alum & mag hydroxide-simeth (MAALOX/MYLANTA) 200-200-20 MG/5ML suspension Take 15 mLs by mouth every 6 (six) hours as needed for indigestion or heartburn. 05/23/21   Arnetha Courser, MD  atorvastatin (LIPITOR) 20 MG tablet Take 20 mg by mouth at bedtime.    [provider]  cholecalciferol (VITAMIN D3) 25 MCG (1000 UT) tablet Take 1,000 Units by mouth daily.    [provider]  docusate sodium (COLACE) 100 MG capsule Take 1 capsule (100 mg total) by mouth 2 (two) times daily as needed for mild constipation. 11/27/22   Arnetha Courser, MD  empagliflozin (JARDIANCE) 10 MG TABS tablet Take 10 mg by mouth daily. 08/10/22   [provider]  feeding supplement (ENSURE ENLIVE / ENSURE  PLUS) LIQD Take 237 mLs by mouth 3 (three) times daily between meals. 11/27/22   Arnetha Courser, MD  furosemide (LASIX) 20 MG tablet Take 20 mg by mouth daily. 04/05/22   [provider]  gabapentin (NEURONTIN) 300 MG capsule Take 1 capsule (300 mg total) by mouth 3 (three) times daily. Patient taking differently: Take 300 mg by mouth 2 (two) times daily. 11/27/22   Arnetha Courser, MD  insulin degludec (TRESIBA FLEXTOUCH) 100 UNIT/ML FlexTouch Pen Inject 25 Units into the skin at bedtime.    [provider]  insulin glargine (LANTUS) 100 UNIT/ML injection Inject 0.3 mLs (30 Units total) into the skin daily. Patient not taking: Reported on 06/28/2023 05/17/21   Leeroy Bock, MD  insulin lispro (HUMALOG) 100 UNIT/ML KwikPen Inject 2-12 Units into the skin 3 times daily with meals, bedtime and 2 AM. Sliding scale insulin < 60 = Call MD 200-249 = 2 units 250-299 = 4 units 300-349 = 6 units 350-359 = 8 units 400-449 = 10 units 450-499 = 12 units >500 = Call MD    [provider]  isosorbide mononitrate (IMDUR) 60 MG 24 hr tablet Take  60 mg by mouth daily.    [provider]  leptospermum manuka honey (MEDIHONEY) PSTE paste Apply 1 Application topically daily. For 1 more week 11/28/22   Arnetha Courser, MD  metoprolol succinate (TOPROL-XL) 50 MG 24 hr tablet Take 1 tablet (50 mg total) by mouth daily. Take with or immediately following a meal. 07/03/23 08/02/23  Charise Killian, MD  Multiple Vitamins-Minerals (PRESERVISION AREDS 2+MULTI VIT) CAPS Take 1 capsule by mouth 2 (two) times daily.    [provider]  nitroGLYCERIN (NITROSTAT) 0.4 MG SL tablet Place 0.4 mg under the tongue every 5 (five) minutes as needed for chest pain.     [provider]  Nystatin (GERHARDT'S BUTT CREAM) CREA Apply 1 Application topically 3 (three) times daily as needed for irritation. Patient not taking: Reported on 06/28/2023 11/27/22   Arnetha Courser, MD  ondansetron  (ZOFRAN) 4 MG tablet Take 1 tablet (4 mg total) by mouth every 6 (six) hours as needed for nausea. 11/27/22   Arnetha Courser, MD  sertraline (ZOLOFT) 50 MG tablet Take 1 tablet by mouth daily. 08/28/22   [provider]  tamsulosin (FLOMAX) 0.4 MG CAPS capsule Take 0.8 mg by mouth at bedtime.     [provider]  valsartan (DIOVAN) 80 MG tablet Take 80 mg by mouth daily. 09/05/22   [provider]    Physical Exam: Vitals:   07/18/23 2100 07/18/23 2145 07/18/23 2200 07/18/23 2300  BP: 93/62 (!) 97/47 (!) 126/41   Pulse: 72 73 76 69  Resp: 20 20 (!) 25 17  Temp:      TempSrc:      SpO2: 100% 100% 100% 100%  Weight:      Height:       General: Not in acute distress.  Dry mucous membrane HEENT:       Eyes: PERRL, EOMI, no jaundice       ENT: No discharge from the ears and nose, no pharynx injection, no tonsillar enlargement.        Neck: No JVD, no bruit, no mass felt. Heme: No neck lymph node enlargement. Cardiac: S1/S2, RRR, No gallops or rubs. Respiratory: No rales, wheezing, rhonchi or rubs. GI: Soft, nondistended, nontender, no rebound pain, no organomegaly, BS present. GU: No hematuria Ext: No pitting leg edema bilaterally. 1+DP/PT pulse bilaterally. Musculoskeletal: No joint deformities, No joint redness or warmth, no limitation of ROM in spin. Skin: No rashes.  Neuro: Alert, following command, cranial nerves II-XII grossly intact, moves all extremities normally. Psych: Patient is not psychotic, no suicidal or hemocidal ideation.  Labs on Admission: I have personally reviewed following labs and imaging studies  CBC: Recent Labs  Lab 07/18/23 1548  WBC 9.7  NEUTROABS 8.4*  HGB 9.6*  HCT 26.7*  MCV 82.2  PLT 76*   Basic Metabolic Panel: Recent Labs  Lab 07/18/23 1548 07/18/23 1836 07/18/23 2009  NA 122*  --  125*  K 5.9*  --  6.0*  CL 96*  --  102  CO2 14*  --  14*  GLUCOSE 399*  --  362*  BUN 152*  --  138*  CREATININE 4.15*  --   3.65*  CALCIUM 8.2*  --  7.6*  MG  --   --  1.9  PHOS  --  4.1  --    GFR: Estimated Creatinine Clearance: 13.8 mL/min (A) (by C-G formula based on SCr of 3.65 mg/dL (H)). Liver Function Tests: Recent Labs  Lab 07/18/23 1548  AST 10*  ALT 16  ALKPHOS 55  BILITOT 0.5  PROT 5.5*  ALBUMIN 2.7*   No results for input(s): "LIPASE", "AMYLASE" in the last 168 hours. No results for input(s): "AMMONIA" in the last 168 hours. Coagulation Profile: Recent Labs  Lab 07/18/23 1836  INR 1.0   Cardiac Enzymes: No results for input(s): "CKTOTAL", "CKMB", "CKMBINDEX", "TROPONINI" in the last 168 hours. BNP (last 3 results) No results for input(s): "PROBNP" in the last 8760 hours. HbA1C: No results for input(s): "HGBA1C" in the last 72 hours. CBG: Recent Labs  Lab 07/18/23 1539 07/18/23 2256  GLUCAP 379* 374*   Lipid Profile: No results for input(s): "CHOL", "HDL", "LDLCALC", "TRIG", "CHOLHDL", "LDLDIRECT" in the last 72 hours. Thyroid Function Tests: No results for input(s): "TSH", "T4TOTAL", "FREET4", "T3FREE", "THYROIDAB" in the last 72 hours. Anemia Panel: No results for input(s): "VITAMINB12", "FOLATE", "FERRITIN", "TIBC", "IRON", "RETICCTPCT" in the last 72 hours. Urine analysis:    Component Value Date/Time   COLORURINE YELLOW (A) 06/27/2023 1854   APPEARANCEUR TURBID (A) 06/27/2023 1854   LABSPEC 1.014 06/27/2023 1854   PHURINE 5.0 06/27/2023 1854   GLUCOSEU 50 (A) 06/27/2023 1854   HGBUR MODERATE (A) 06/27/2023 1854   BILIRUBINUR NEGATIVE 06/27/2023 1854   KETONESUR NEGATIVE 06/27/2023 1854   PROTEINUR 100 (A) 06/27/2023 1854   NITRITE NEGATIVE 06/27/2023 1854   LEUKOCYTESUR MODERATE (A) 06/27/2023 1854   Sepsis Labs: @LABRCNTIP (procalcitonin:4,lacticidven:4) ) Recent Results (from the past 240 hours)  Resp panel by RT-PCR (RSV, Flu A&B, Covid) Anterior Nasal Swab     Status: None   Collection Time: 07/18/23  4:26 PM   Specimen: Anterior Nasal Swab  Result  Value Ref Range Status   SARS Coronavirus 2 by RT PCR NEGATIVE NEGATIVE Final    Comment: (NOTE) SARS-CoV-2 target nucleic acids are NOT DETECTED.  The SARS-CoV-2 RNA is generally detectable in upper respiratory specimens during the acute phase of infection. The lowest concentration of SARS-CoV-2 viral copies this assay can detect is 138 copies/mL. A negative result does not preclude SARS-Cov-2 infection and should not be used as the sole basis for treatment or other patient management decisions. A negative result may occur with  improper specimen collection/handling, submission of specimen other than nasopharyngeal swab, presence of viral mutation(s) within the areas targeted by this assay, and inadequate number of viral copies(<138 copies/mL). A negative result must be combined with clinical observations, patient history, and epidemiological information. The expected result is Negative.  Fact Sheet for Patients:  BloggerCourse.com  Fact Sheet for Healthcare Providers:  SeriousBroker.it  This test is no t yet approved or cleared by the [place] FDA and  has been authorized for detection and/or diagnosis of SARS-CoV-2 by FDA under an Emergency Use Authorization (EUA). This EUA will remain  in effect (meaning this test can be used) for the duration of the COVID-19 declaration under Section 564(b)(1) of the Act, 21 U.S.C.section 360bbb-3(b)(1), unless the authorization is terminated  or revoked sooner.       Influenza A by PCR NEGATIVE NEGATIVE Final   Influenza B by PCR NEGATIVE NEGATIVE Final    Comment: (NOTE) The Xpert Xpress SARS-CoV-2/FLU/RSV plus assay is intended as an aid in the diagnosis of influenza from Nasopharyngeal swab specimens and should not be used as a sole basis for treatment. Nasal washings and aspirates are unacceptable for Xpert Xpress SARS-CoV-2/FLU/RSV testing.  Fact Sheet for  Patients: BloggerCourse.com  Fact Sheet for Healthcare Providers: SeriousBroker.it  This test is not yet approved  or cleared by the [place] and has been authorized for detection and/or diagnosis of SARS-CoV-2 by FDA under an Emergency Use Authorization (EUA). This EUA will remain in effect (meaning this test can be used) for the duration of the COVID-19 declaration under Section 564(b)(1) of the Act, 21 U.S.C. section 360bbb-3(b)(1), unless the authorization is terminated or revoked.     Resp Syncytial Virus by PCR NEGATIVE NEGATIVE Final    Comment: (NOTE) Fact Sheet for Patients: BloggerCourse.com  Fact Sheet for Healthcare Providers: SeriousBroker.it  This test is not yet approved or cleared by the [place] FDA and has been authorized for detection and/or diagnosis of SARS-CoV-2 by FDA under an Emergency Use Authorization (EUA). This EUA will remain in effect (meaning this test can be used) for the duration of the COVID-19 declaration under Section 564(b)(1) of the Act, 21 U.S.C. section 360bbb-3(b)(1), unless the authorization is terminated or revoked.  Performed at [facility], 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)   Myocardial injury   Hypertension   Hyponatremia   Hyperkalemia   Chronic diastolic CHF (congestive heart failure) (HCC)   Acute renal failure superimposed on stage 3a chronic kidney disease (HCC)   Paroxysmal atrial fibrillation (HCC)   Type II diabetes mellitus with renal manifestations (HCC)   Dementia without behavioral disturbance (HCC)   Hyperlipidemia   Thrombocytopenia (HCC)   BPH (benign  prostatic hyperplasia)   Depression   Assessment and Plan:   Principal Problem:  Severe sepsis with septic shock due to UTI: Patient has severe sepsis with septic shock, with hypothermia temperature 95.7, heart rate 95, RR 24.  Patient has been persistently hypotensive.  Lactic acid is up to 2.5.  Has worsening renal function.  CT scan negative for obstructive stone.  Patient was aggressively IV fluid resuscitated, totally 3.75 L of normal saline bolus was given.  Blood pressure finally improved to SBP 90s.  -Admitted to stepdown as inpatient -IV fluid bolus and then 125 cc/h -Start midodrine 10 mg 3 times daily -Solu-Cortef 100 mg -Albumin 25 mg -Follow-up blood culture urine culture -Check procalcitonin level -Rocephin (patient received 1 dose of vancomycin and cefepime in ED)  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) and Myocardial injury: Troponin is minimally elevated 54 --> 45.  No chest pain -Continue Lipitor  Hypertension -Hold all blood pressure medications due to hypotension  Hyponatremia: Sodium 122 -Patient is on IV fluid resuscitation with normal saline - Will check urine sodium, urine osmolality, serum osmolality. - Fluid restriction - IVF: 1L NS in ED, will continue with IV normal saline at 75 mL/h - Sodium chloride tablet 1 g twice daily - f/u by BMP q8h  Hyperkalemia: Potassium 5.9 -Leukoma 10 g  Chronic diastolic CHF (congestive heart failure) (HCC): 2D echo on 07/21/2022 showed EF > 55% with grade 2 diastolic dysfunction.  Patient is clinically dry.  BNP 56.5 -Hold Lasix  Acute renal failure superimposed on stage 3a chronic kidney disease (HCC): Baseline creatinine 2.18 on 07/03/2023.  His creatinine is at 4.15, BUN 152, GFR 14.  Likely multifactorial etiology, including dehydration and continuation of diuretics and Divan, UTI, possible ATN due to hypotension.  CT scan showed bilateral hydroureteronephrosis, but no obstructive stone. -Avoid renal  toxic medications -  IV fluid as above -Hold Lasix, Diovan  Paroxysmal atrial fibrillation (HCC): Heart rate 90s --> 70s -Hold metoprolol due to hypotension  Type II diabetes mellitus with renal manifestations [facility]): Patient is taking Jardiance, Humalog, glargine insulin 32 units daily.  Recent A1c 6.8, well-controlled.  Blood sugar elevated 379, but anion gap normal 12.  No DKA. -Sliding scale insulin -Glargine insulin 30 units daily  Dementia without behavioral disturbance [facility]) -Fall precaution  Hyperlipidemia -Lipitor  Thrombocytopenia (HCC): This is chronic issue.  Platelets 76.  No active bleeding -Follow-up by CBC  BPH (benign prostatic hyperplasia) -Hold Flomax due to hypotension  Depression -Continue home medications     CRITICAL CARE Performed by: Lorretta Harp   Total critical care time:  70  minutes  Critical care time was exclusive of separately billable procedures and treating other patients.  Critical care was necessary to treat or prevent imminent or life-threatening deterioration.  Critical care was time spent personally by me on the following activities: development of treatment plan with patient and/or surrogate as well as nursing, discussions with consultants, evaluation of patient's response to treatment, examination of patient, obtaining history from patient or surrogate, ordering and performing treatments and interventions, ordering and review of laboratory studies, ordering and review of radiographic studies, pulse oximetry and re-evaluation of patient's condition.         DVT ppx: SCD  Code Status: DNR  Family Communication:     not done, no family member is at bed side.     Disposition Plan:  Anticipate discharge back to previous environment, SNF  Consults called:  none  Admission status and Level of care: Stepdown:    for obs as inpt        Dispo: The patient is from: SNF              Anticipated d/c is to: SNF               Anticipated d/c date is: 2 days              Patient currently is not medically stable to d/c.    Severity of Illness:  The appropriate patient status for this patient is INPATIENT. Inpatient status is judged to be reasonable and necessary in order to provide the required intensity of service to ensure the patient's safety. The patient's presenting symptoms, physical exam findings, and initial radiographic and laboratory data in the context of their chronic comorbidities is felt to place them at high risk for further clinical deterioration. Furthermore, it is not anticipated that the patient will be medically stable for discharge from the hospital within 2 midnights of admission.   * I certify that at the point of admission it is my clinical judgment that the patient will require inpatient hospital care spanning beyond 2 midnights from the point of admission due to high intensity of service, high risk for further deterioration and high frequency of surveillance required.*       Date of Service 07/19/2023    Lorretta Harp [facility]   If 7PM-7AM, please contact night-coverage [email or web address] 07/19/2023, 12:33 AM

## 2023-07-18 NOTE — ED Provider Notes (Signed)
[facility] Provider Note    Event Date/Time   First MD Initiated Contact with Patient 07/18/23 1528     (approximate)   History   Hyperglycemia and Cough   HPI  Peter Robertson is a 83 y.o. male with a history of hypertension, hyperlipidemia, diabetes, CAD, diastolic CHF, sarcoidosis, CKD, atrial fibrillation not on anticoagulation, thrombocytopenia, nephrostomy, and dementia who presents with hyperglycemia.  Per EMS, staff at his facility noted a "junky" cough.  Glucose for EMS was 519.  Per EMS, he is at his baseline mental status.  The patient states he is feeling fine.  He reports some chest discomfort but denies any abdominal pain, nausea or vomiting, or any difficulty breathing.  I reviewed the past medical records; the patient was admitted to the hospitalist service earlier this month after presenting with a fall and being diagnosed with a UTI.  Physical Exam   Triage Vital Signs: ED Triage Vitals [07/18/23 1536]  Encounter Vitals Group     BP      Systolic BP Percentile      Diastolic BP Percentile      Pulse      Resp      Temp      Temp src      SpO2      Weight 154 lb 5.2 oz (70 kg)     Height 5\' 6"  (1.676 m)     Head Circumference      Peak Flow      Pain Score      Pain Loc      Pain Education      Exclude from Growth Chart     Most recent vital signs: Vitals:   07/18/23 1800 07/18/23 1833  BP: (!) 73/34 (!) 95/42  Pulse: 77 79  Resp: 20 15  Temp:    SpO2: 94% 97%     General: Alert, oriented x 2, weak and frail appearing but in no distress.  CV:  Good peripheral perfusion.  Resp:  Normal effort.  Lungs CTAB. Abd:  Soft and nontender.  No distention.  Other:  Very dry mucous membranes.  EOMI.  PERRLA.  No facial droop.  Normal speech.  Motor intact in all extremities.  No peripheral edema.   ED Results / Procedures / Treatments   Labs (all labs ordered are listed, but only abnormal results are displayed) Labs  Reviewed  LACTIC ACID, PLASMA - Abnormal; Notable for the following components:      Result Value   Lactic Acid, Venous 2.5 (*)    All other components within normal limits  COMPREHENSIVE METABOLIC PANEL - Abnormal; Notable for the following components:   Sodium 122 (*)    Potassium 5.9 (*)    Chloride 96 (*)    CO2 14 (*)    Glucose, Bld 399 (*)    BUN 152 (*)    Creatinine, Ser 4.15 (*)    Calcium 8.2 (*)    Total Protein 5.5 (*)    Albumin 2.7 (*)    AST 10 (*)    GFR, Estimated 14 (*)    All other components within normal limits  CBC WITH DIFFERENTIAL/PLATELET - Abnormal; Notable for the following components:   RBC 3.25 (*)    Hemoglobin 9.6 (*)    HCT 26.7 (*)    Platelets 76 (*)    Neutro Abs 8.4 (*)    Lymphs Abs 0.3 (*)    All other components within  normal limits  BLOOD GAS, VENOUS - Abnormal; Notable for the following components:   pH, Ven 7.22 (*)    pCO2, Ven 38 (*)    pO2, Ven <31 (*)    Bicarbonate 15.6 (*)    Acid-base deficit 11.4 (*)    All other components within normal limits  CBG MONITORING, ED - Abnormal; Notable for the following components:   Glucose-Capillary 379 (*)    All other components within normal limits  TROPONIN I (HIGH SENSITIVITY) - Abnormal; Notable for the following components:   Troponin I (High Sensitivity) 54 (*)    All other components within normal limits  RESP PANEL BY RT-PCR (RSV, FLU A&B, COVID)  RVPGX2  CULTURE, BLOOD (ROUTINE X 2)  CULTURE, BLOOD (ROUTINE X 2)  URINE CULTURE  BRAIN NATRIURETIC PEPTIDE  LACTIC ACID, PLASMA  URINALYSIS, W/ REFLEX TO CULTURE (INFECTION SUSPECTED)  CORTISOL  HEMOGLOBIN A1C  MAGNESIUM  PHOSPHORUS  OSMOLALITY  OSMOLALITY, URINE  SODIUM, URINE, RANDOM  BASIC METABOLIC PANEL  BASIC METABOLIC PANEL  PROTIME-INR  APTT  PROCALCITONIN  TROPONIN I (HIGH SENSITIVITY)     EKG  ED ECG REPORT I, Dionne Bucy, the attending physician, personally viewed and interpreted this  ECG.  Date: 07/18/2023 EKG Time: 1706 Rate: 75 Rhythm: normal sinus rhythm QRS Axis: normal Intervals: RBBB ST/T Wave abnormalities: Nonspecific ST abnormalities Narrative Interpretation: no evidence of acute ischemia    RADIOLOGY  Chest x-ray: I independently viewed and interpreted the images; there is no focal consolidation or edema  PROCEDURES:  Critical Care performed: Yes, see critical care procedure note(s)  .Critical Care  Performed by: Dionne Bucy, MD Authorized by: Dionne Bucy, MD   Critical care provider statement:    Critical care time (minutes):  45   Critical care time was exclusive of:  Separately billable procedures and treating other patients   Critical care was necessary to treat or prevent imminent or life-threatening deterioration of the following conditions:  Sepsis, dehydration and shock   Critical care was time spent personally by me on the following activities:  Development of treatment plan with patient or surrogate, discussions with consultants, evaluation of patient's response to treatment, examination of patient, ordering and review of laboratory studies, ordering and review of radiographic studies, ordering and performing treatments and interventions, pulse oximetry, re-evaluation of patient's condition, review of old charts and obtaining history from patient or surrogate   Care discussed with: admitting provider      MEDICATIONS ORDERED IN ED: Medications  midodrine (PROAMATINE) tablet 10 mg (has no administration in time range)  hydrocortisone sodium succinate (SOLU-CORTEF) 100 MG injection 100 mg (has no administration in time range)  albumin human 25 % solution 25 g (has no administration in time range)  albuterol (PROVENTIL) (2.5 MG/3ML) 0.083% nebulizer solution 2.5 mg (has no administration in time range)  dextromethorphan-guaiFENesin (MUCINEX DM) 30-600 MG per 12 hr tablet 1 tablet (has no administration in time range)   ondansetron (ZOFRAN) injection 4 mg (has no administration in time range)  acetaminophen (TYLENOL) tablet 650 mg (has no administration in time range)  insulin aspart (novoLOG) injection 0-9 Units (has no administration in time range)  insulin aspart (novoLOG) injection 0-5 Units (has no administration in time range)  sodium zirconium cyclosilicate (LOKELMA) packet 10 g (has no administration in time range)  sodium chloride 0.9 % bolus 500 mL (has no administration in time range)  0.9 %  sodium chloride infusion (has no administration in time range)  sodium chloride 0.9 %  bolus 1,000 mL (0 mLs Intravenous Stopped 07/18/23 1617)    And  sodium chloride 0.9 % bolus 1,000 mL (0 mLs Intravenous Stopped 07/18/23 1704)    And  sodium chloride 0.9 % bolus 250 mL (0 mLs Intravenous Stopped 07/18/23 1703)  ceFEPIme (MAXIPIME) 2 g in sodium chloride 0.9 % 100 mL IVPB (0 g Intravenous Stopped 07/18/23 1633)  vancomycin (VANCOCIN) IVPB 1000 mg/200 mL premix (0 mg Intravenous Stopped 07/18/23 1737)  sodium chloride 0.9 % bolus 1,000 mL (0 mLs Intravenous Stopped 07/18/23 1737)     IMPRESSION / MDM / ASSESSMENT AND PLAN / ED COURSE  I reviewed the triage vital signs and the nursing notes.  83 year old male with PMH as noted above presents with hyperglycemia and cough.  The patient denies other acute symptoms.  On exam he is Tensive hypotensive and hypothermic.  Other vital signs are normal.  He is weak and frail appearing with a nonfocal neurologic exam and dry mucous membranes.  Glucose here is 379.  Differential diagnosis includes, but is not limited to, acute action/sepsis, possible pneumonia or UTI, viral syndrome, dehydration, AKI, electrolyte abnormality, DKA, HHS, other hyperglycemic crisis, less likely cardiac cause.  There is no evidence of acute CNS process given a nonfocal neurologic exam.  We will give fluids and empiric antibiotics per the sepsis protocol, obtain lab workup, chest x-ray, and  reassess.  Patient's presentation is most consistent with acute presentation with potential threat to life or bodily function.  The patient is on the cardiac monitor to evaluate for evidence of arrhythmia and/or significant heart rate changes.   ----------------------------------------- 6:23 PM on 07/18/2023 -----------------------------------------  Chest x-ray shows no significant acute findings.  Lab workup is significant for elevated lactic acid, hyponatremia to 122, and elevated creatinine from baseline.  Respiratory panel is negative.  There is no evidence of DKA.  The patient's blood pressure has responded somewhat to fluids but has dropped low again.  Per the patient's MOLST form, his wishes are for limited intervention including antibiotics and fluids but avoiding ICU level care.  Therefore this would rule out vasopressor support.  He is DNR/DNI.  I called his caregiver and emergency contact [name] and discussed the situation with her including the persistent hypotension, and continuing with fluids and supportive care versus escalating to vasopressor support.  She would like to speak to some additional family members before determining the path forward.  ----------------------------------------- 6:53 PM on 07/18/2023 -----------------------------------------  [name] confirms that the family wishes are continued treatment with antibiotics, fluids, and supportive care, but no vasopressors or ICU level care.  I consulted Dr. Clyde Lundborg from the hospitalist service; based on our discussion he agrees to evaluate the patient for admission.   FINAL CLINICAL IMPRESSION(S) / ED DIAGNOSES   Final diagnoses:  Sepsis with acute renal failure and septic shock, due to unspecified organism, unspecified acute renal failure type (HCC)     Rx / DC Orders   ED Discharge Orders     None        Note:  This document was prepared using Dragon voice recognition software and may include  unintentional dictation errors.    Dionne Bucy, MD 07/18/23 [phone]

## 2023-07-18 NOTE — Progress Notes (Signed)
[facility] Liaison note:   This patient is a current [facility] patient. [facility] will continue to follow for discharge disposition.    Please call for any outpatient based palliative care related questions or concerns.   [facility] Liaison [phone]

## 2023-07-18 NOTE — ED Notes (Signed)
Presenter, broadcasting receives pt from [facility] ED without report from any staff pt is aox4 appears lethargic swallowing some tea and some applesauce without aspiration strong nonproductive cough noted 20g piv to LAC appears wdi patent secured not infiltrated @ this time

## 2023-07-18 NOTE — ED Triage Notes (Signed)
Pt to ER via EMS from [facility] with c/o hyperglycemia.  CBG by EMS 519. EMS also reports "junky cough".  Pt has dementia at baseline.

## 2023-07-18 NOTE — Sepsis Progress Note (Signed)
Code Sepsis protocol being monitored by eLink.

## 2023-07-18 NOTE — Progress Notes (Signed)
CODE SEPSIS - PHARMACY COMMUNICATION  **Broad Spectrum Antibiotics should be administered within 1 hour of Sepsis diagnosis**  Time Code Sepsis Called/Page Received: 1548  Antibiotics Ordered: cefepime, vanc  Time of 1st antibiotic administration: 1559  Additional action taken by pharmacy: N/A  If necessary, Name of Provider/Nurse Contacted: N/A    Merryl Hacker ,PharmD Clinical Pharmacist  07/18/2023  3:49 PM

## 2023-07-19 ENCOUNTER — Encounter: Payer: Self-pay | Admitting: Internal Medicine

## 2023-07-19 DIAGNOSIS — A419 Sepsis, unspecified organism: Secondary | ICD-10-CM | POA: Diagnosis not present

## 2023-07-19 DIAGNOSIS — E876 Hypokalemia: Secondary | ICD-10-CM | POA: Diagnosis present

## 2023-07-19 DIAGNOSIS — Z515 Encounter for palliative care: Secondary | ICD-10-CM

## 2023-07-19 DIAGNOSIS — R6521 Severe sepsis with septic shock: Secondary | ICD-10-CM | POA: Diagnosis not present

## 2023-07-19 LAB — ALBUMIN: Albumin: 2.6 g/dL — ABNORMAL LOW (ref 3.5–5.0)

## 2023-07-19 LAB — CBG MONITORING, ED
Glucose-Capillary: 286 mg/dL — ABNORMAL HIGH (ref 70–99)
Glucose-Capillary: 305 mg/dL — ABNORMAL HIGH (ref 70–99)
Glucose-Capillary: 233 mg/dL — ABNORMAL HIGH (ref 70–99)
Glucose-Capillary: 250 mg/dL — ABNORMAL HIGH (ref 70–99)

## 2023-07-19 LAB — CBC
HCT: 22.8 % — ABNORMAL LOW (ref 39.0–52.0)
Hemoglobin: 8 g/dL — ABNORMAL LOW (ref 13.0–17.0)
MCH: 29.2 pg (ref 26.0–34.0)
MCHC: 35.1 g/dL (ref 30.0–36.0)
MCV: 83.2 fL (ref 80.0–100.0)
Platelets: 60 10*3/uL — ABNORMAL LOW (ref 150–400)
RBC: 2.74 MIL/uL — ABNORMAL LOW (ref 4.22–5.81)
RDW: 15.1 % (ref 11.5–15.5)
WBC: 6.2 10*3/uL (ref 4.0–10.5)
nRBC: 0 % (ref 0.0–0.2)

## 2023-07-19 LAB — BLOOD GAS, VENOUS
Acid-base deficit: 11.3 mmol/L — ABNORMAL HIGH (ref 0.0–2.0)
Bicarbonate: 15.6 mmol/L — ABNORMAL LOW (ref 20.0–28.0)
Bicarbonate: 14.1 mmol/L — ABNORMAL LOW (ref 20.0–28.0)
O2 Saturation: 43.2 mmol/L — ABNORMAL HIGH (ref 0.0–2.0)
O2 Saturation: 78.6 %
Patient temperature: 37
Patient temperature: 43.2 %
Patient temperature: 37
pCO2, Ven: 38 mm[Hg] — ABNORMAL LOW (ref 44–60)
pCO2, Ven: 30 mm[Hg] — ABNORMAL LOW (ref 44–60)
pH, Ven: 7.22 — ABNORMAL LOW (ref 7.25–7.43)
pH, Ven: 7.28 (ref 7.25–7.43)
pO2, Ven: <31 mmol/L — CL (ref 32–45)
pO2, Ven: 39 mm[Hg] (ref 32–45)

## 2023-07-19 LAB — BASIC METABOLIC PANEL
Anion gap: 8 (ref 5–15)
Anion gap: 9 (ref 5–15)
Anion gap: 11 (ref 5–15)
BUN: 136 mg/dL — ABNORMAL HIGH (ref 8–23)
BUN: 139 mg/dL — ABNORMAL HIGH (ref 8–23)
BUN: 119 mg/dL — ABNORMAL HIGH (ref 8–23)
CO2: 15 mmol/L — ABNORMAL LOW (ref 22–32)
CO2: 16 mmol/L — ABNORMAL LOW (ref 22–32)
CO2: 15 mmol/L — ABNORMAL LOW (ref 22–32)
Calcium: 7.9 mg/dL — ABNORMAL LOW (ref 8.9–10.3)
Calcium: 8 mg/dL — ABNORMAL LOW (ref 8.9–10.3)
Calcium: 7.9 mg/dL — ABNORMAL LOW (ref 8.9–10.3)
Chloride: 104 mmol/L (ref 98–111)
Chloride: 105 mmol/L (ref 98–111)
Chloride: 107 mmol/L (ref 98–111)
Creatinine, Ser: 3.36 mg/dL — ABNORMAL HIGH (ref 0.61–1.24)
Creatinine, Ser: 3.06 mg/dL — ABNORMAL HIGH (ref 0.61–1.24)
Creatinine, Ser: 2.86 mg/dL — ABNORMAL HIGH (ref 0.61–1.24)
GFR, Estimated: 17 mL/min — ABNORMAL LOW (ref >60)
GFR, Estimated: 20 mL/min — ABNORMAL LOW (ref >60)
GFR, Estimated: 21 mL/min — ABNORMAL LOW (ref >60)
Glucose, Bld: 336 mg/dL — ABNORMAL HIGH (ref 70–99)
Glucose, Bld: 255 mg/dL — ABNORMAL HIGH (ref 70–99)
Glucose, Bld: 196 mg/dL — ABNORMAL HIGH (ref 70–99)
Potassium: 6.4 mmol/L (ref 3.5–5.1)
Potassium: 5.1 mmol/L (ref 3.5–5.1)
Potassium: 5.1 mmol/L (ref 3.5–5.1)
Sodium: 127 mmol/L — ABNORMAL LOW (ref 135–145)
Sodium: 130 mmol/L — ABNORMAL LOW (ref 135–145)
Sodium: 133 mmol/L — ABNORMAL LOW (ref 135–145)

## 2023-07-19 LAB — POTASSIUM
Potassium: 4.9 mmol/L (ref 3.5–5.1)
Potassium: 5.1 mmol/L (ref 3.5–5.1)

## 2023-07-19 LAB — GLUCOSE, CAPILLARY
Glucose-Capillary: 177 mg/dL — ABNORMAL HIGH (ref 70–99)
Glucose-Capillary: 170 mg/dL — ABNORMAL HIGH (ref 70–99)

## 2023-07-19 MED ORDER — SODIUM ZIRCONIUM CYCLOSILICATE 10 G PO PACK
10.0000 g | PACK | Freq: Two times a day (BID) | ORAL | Status: DC
  Administered 2023-07-19 – 2023-07-21 (×4): 10 g via ORAL
  Filled 2023-07-19 (×4): qty 1

## 2023-07-19 MED ORDER — ENSURE ENLIVE PO LIQD
237.0000 mL | Freq: Three times a day (TID) | ORAL | Status: DC
  Administered 2023-07-19 – 2023-07-24 (×17): 237 mL via ORAL

## 2023-07-19 MED ORDER — SERTRALINE HCL 50 MG PO TABS
50.0000 mg | ORAL_TABLET | Freq: Every day | ORAL | Status: DC
  Administered 2023-07-19 – 2023-07-24 (×6): 50 mg via ORAL
  Filled 2023-07-19 (×6): qty 1

## 2023-07-19 MED ORDER — SODIUM CHLORIDE 0.9 % IV SOLN: INTRAVENOUS | Status: AC

## 2023-07-19 MED ORDER — GABAPENTIN 300 MG PO CAPS
300.0000 mg | ORAL_CAPSULE | Freq: Two times a day (BID) | ORAL | Status: DC
  Administered 2023-07-20 – 2023-07-24 (×9): 300 mg via ORAL
  Filled 2023-07-19 (×9): qty 1

## 2023-07-19 MED ORDER — ATORVASTATIN CALCIUM 20 MG PO TABS
20.0000 mg | ORAL_TABLET | Freq: Every day | ORAL | Status: DC
  Administered 2023-07-19 – 2023-07-23 (×6): 20 mg via ORAL
  Filled 2023-07-19 (×6): qty 1

## 2023-07-19 MED ORDER — INSULIN GLARGINE-YFGN 100 UNIT/ML ~~LOC~~ SOLN
30.0000 [IU] | Freq: Every day | SUBCUTANEOUS | Status: DC
  Administered 2023-07-19: 30 [IU] via SUBCUTANEOUS
  Filled 2023-07-19 (×2): qty 0.3

## 2023-07-19 MED ORDER — SODIUM CHLORIDE 0.9 % IV SOLN
1.0000 g | INTRAVENOUS | Status: DC
  Administered 2023-07-19 – 2023-07-22 (×4): 1 g via INTRAVENOUS
  Filled 2023-07-19 (×4): qty 10

## 2023-07-19 MED ORDER — INSULIN GLARGINE-YFGN 100 UNIT/ML ~~LOC~~ SOLN
33.0000 [IU] | Freq: Every day | SUBCUTANEOUS | Status: DC
  Administered 2023-07-19 – 2023-07-23 (×5): 33 [IU] via SUBCUTANEOUS
  Filled 2023-07-19 (×5): qty 0.33

## 2023-07-19 MED ORDER — INSULIN ASPART 100 UNIT/ML IV SOLN
10.0000 [IU] | Freq: Once | INTRAVENOUS | Status: AC
  Administered 2023-07-19: 10 [IU] via INTRAVENOUS
  Filled 2023-07-19: qty 0.1

## 2023-07-19 MED ORDER — FUROSEMIDE 10 MG/ML IJ SOLN
80.0000 mg | Freq: Once | INTRAMUSCULAR | Status: AC
  Administered 2023-07-19: 80 mg via INTRAVENOUS
  Filled 2023-07-19: qty 8

## 2023-07-19 MED ORDER — SODIUM ZIRCONIUM CYCLOSILICATE 10 G PO PACK
10.0000 g | PACK | Freq: Two times a day (BID) | ORAL | Status: DC
  Administered 2023-07-19: 10 g via ORAL
  Filled 2023-07-19 (×2): qty 1

## 2023-07-19 MED ORDER — SODIUM CHLORIDE 1 G PO TABS
1.0000 g | ORAL_TABLET | Freq: Two times a day (BID) | ORAL | Status: DC
  Administered 2023-07-19 – 2023-07-24 (×13): 1 g via ORAL
  Filled 2023-07-19 (×13): qty 1

## 2023-07-19 NOTE — ED Notes (Signed)
Foley cath started per order pt has full brief prior to cath start

## 2023-07-19 NOTE — Inpatient Diabetes Management (Signed)
Inpatient Diabetes Program Recommendations  AACE/ADA: New Consensus Statement on Inpatient Glycemic Control   Target Ranges:  Prepandial:   less than 140 mg/dL      Peak postprandial:   less than 180 mg/dL (1-2 hours)      Critically ill patients:  140 - 180 mg/dL    Latest Reference Range & Units 07/18/23 15:39 07/18/23 22:56 07/19/23 01:15 07/19/23 04:00 07/19/23 07:27  Glucose-Capillary 70 - 99 mg/dL 578 (H) 469 (H) 629 (H) 305 (H) 233 (H)   Review of Glycemic Control  Diabetes history: DM2 Outpatient Diabetes medications: Lantus 32 units QHS, Humalog 2-12 units AC&HS and 2am, Jardiance 10 mg daily Current orders for Inpatient glycemic control: Semglee 30 units QHS, Novolog 0-9 units TID with meals, Novolog 0-5 units at bedtime   Inpatient Diabetes Program Recommendations:     Insulin:Please consider increasing Semglee to 33 units at bedtime and ordering Novolog 3 units TID with meals for meal coverage if patient eats at least 50% of meals.   Outpatient DM meds: Patient admitted with UTI (also inpt 06/27/23-07/02/23 with UTI).  Due to risk of UTI with Jardiance, please consider stopping Jardiance at discharge.   Thanks, Orlando Penner, RN, MSN, CDCES Diabetes Coordinator Inpatient Diabetes Program [phone] (Team Pager from 8am to 5pm)

## 2023-07-19 NOTE — Progress Notes (Signed)
       CROSS COVER NOTE  NAME: Peter Robertson MRN: [id number] DOB : 12/22/1940 ATTENDING PHYSICIAN: Lorretta Harp, MD    Date of Service   07/19/2023   HPI/Events of Note   Message sent via secure chat K result 6.4  from Tiffany RN Result confirmed in Epic and with lab tech [facility] over phone  Interventions   Assessment/Plan Patient admitted with severe sepsis with septic shock from UTI and AKI on CKD IV.  Hyronephrosis with sever hydroureters to level of bladder on CT without obstructing stones per CT report. No urine out put documented. No response from nurse regarding urine output Hyperkalemia focused orders - lokelma, 60 mg IV lasix, 10 units IV insulin with cbg monitoring  Stat VBG ordered  Foley for output monitoring       Donnie Mesa NP [facility] Cross Cover 7pm-7am - check amion for availability Pager [phone]

## 2023-07-19 NOTE — Progress Notes (Signed)
[facility] ED 19- [facility] hospitalized hospice patient visit  [name] is a current [facility] patient with a terminal diagnosis of Cerebrovascular Disease. He resides at [facility] and over the last 24 hours had developed high blood glucose levels and worsening mentation. Facility provider made decision to send him to the ED for further evaluation. He was admitted 1.29.25 with diagnosis of septic shock related to UTI. Per Dr. Patric Robertson with [facility] this is a related admission.  Visited with patient at bedside in ED. He is resting quietly with eyes closed and in no apparent distress. He is easily arousable and conversant to verbal stimuli. He denies any pain or discomfort at this time but is also confused and does not realize he is in the emergency room. Talked at length by phone with primary caregiver Peter Robertson who relays that patient would want treatment for acute illness with antibiotics and fluids but would not want any further aggressive measures.   Patient is inpatient appropriate with need for IVAB and fluids.   Vital Signs: 97.9/89/26     124/89    spO2 100% room air I&O: Not yet recorded Abnormal labs: Na+ 130, Glucose 255, BUN 139, Creatinine 3.06, Ca+ 8, Albumin 2.6, GFR 20, Hgb 8, Hct 22.8, Platelets 60 Diagnostics:  CXR without acute findings CT ABDOMEN AND PELVIS WITHOUT CONTRAST  IMPRESSION: 1. Moderate bilateral hydroureteronephrosis, progressed on the left and new on the right from prior exam. Both ureters are dilated to the bladder insertion. No renal or ureteral calculi. 2. Irregular thick walled wall thickening of the bladder dome, query chronic bladder outlet obstruction. Infection or neoplasm also considered. 3. Cholelithiasis without cholecystitis. 4. Chronic calcified pleural plaques. Small chronic right pleural effusion. 5. Sequela of chronic pancreatitis. 6. Chronic splenomegaly.  Aortic Atherosclerosis (ICD10-I70.0). Electronically  Signed   By: Peter Robertson M.D.   On: 07/18/2023 21:56    IV/PRN Meds: NS 2.5L bolus and continuous @125 /H IV, Albumin 25g IV once, Cefepime 2g IV once, Rocephin 1g IV q24H, Zofran 4mg  IV  Problem List as per H&P Dr. Lorretta Robertson on 1.29.25 Severe sepsis with septic shock due to UTI: Patient has severe sepsis with septic shock, with hypothermia temperature 95.7, heart rate 95, RR 24.  Patient has been persistently hypotensive.  Lactic acid is up to 2.5.  Has worsening renal function.  CT scan negative for obstructive stone.  Patient was aggressively IV fluid resuscitated, totally 3.75 L of normal saline bolus was given.  Blood pressure finally improved to SBP 90s.   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  No chest pain -Continue Lipitor   Hypertension -Hold all blood pressure medications due to hypotension   Hyponatremia: Sodium 122 -Patient is on IV fluid resuscitation with normal saline - Will check urine sodium, urine osmolality, serum osmolality. - Fluid restriction - IVF: 1L NS in ED, will continue with IV normal saline at 75 mL/h - Sodium chloride tablet 1 g twice daily - f/u by BMP q8h   Hyperkalemia: Potassium 5.9 -Leukoma 10 g   Chronic diastolic CHF (congestive heart failure) (HCC): 2D echo on 07/21/2022 showed EF > 55% with grade 2  diastolic dysfunction.  Patient is clinically dry.  BNP 56.5 -Hold Lasix   Acute renal failure superimposed on stage 3a chronic kidney disease (HCC): Baseline creatinine 2.18 on 07/03/2023.  His creatinine is at 4.15, BUN 152, GFR 14.  Likely multifactorial etiology, including dehydration  and continuation of diuretics and Divan, UTI, possible ATN due to hypotension.  CT scan showed bilateral hydroureteronephrosis, but no obstructive stone. -Avoid renal toxic medications -IV fluid as above -Hold Lasix, Diovan   Paroxysmal atrial fibrillation (HCC): Heart rate 90s --> 70s -Hold metoprolol due to hypotension   Type II diabetes mellitus with renal manifestations [facility]): Patient is taking Jardiance, Humalog, glargine insulin 32 units daily.  Recent A1c 6.8, well-controlled.  Blood sugar elevated 379, but anion gap normal 12.  No DKA. -Sliding scale insulin -Glargine insulin 30 units daily   Discharge Planning: Ongoing, currently CG desires treatment for current infection Family Contact: talked with Caregiver Peter Robertson by phone IDT: Updated, talked with CM by phone Goals of Care: DNR Thea Gist, BSN RN Hospice hospital liaison [phone]

## 2023-07-19 NOTE — Progress Notes (Signed)
Progress Note   Patient: Peter Robertson ZOX:[id number] DOB: 1940-09-14 DOA: 07/18/2023     1 DOS: the patient was seen and examined on 07/19/2023   Brief hospital course: Peter Robertson is a 83 y.o. male with medical history significant of hypertension, hyperlipidemia, diabetes mellitus, asthma, GERD, CAD, myocardial infarction, CKD-3A, BPH, BPV, vertigo, thrombocytopenia, dementia, dCHF, kidney stone, A-fib not on anticoagulants, who presents with weakness.  Assessment and Plan:   Severe sepsis with septic shock due to UTI: Patient presented with severe sepsis with septic shock, with hypothermia temperature 95.7, heart rate 95, RR 24.  Patient has been persistently hypotensive.  Lactic acid is up to 2.5.  Has worsening renal function.  CT scan negative for obstructive stone. 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  No chest pain -Continue Lipitor   Hypertension -Hold all blood pressure medications due to hypotension   Hyponatremia likely secondary to hypovolemia Continue IV fluid  Hyperkalemia:-Improving -Continue leukoma 10 g   Chronic diastolic CHF (congestive heart failure) (HCC): 2D echo on 07/21/2022 showed EF > 55% with grade 2 diastolic dysfunction.  Patient is clinically dry.  BNP 56.5 -Hold Lasix   Acute renal failure superimposed on stage 3a chronic kidney disease (HCC): Baseline creatinine 2.18 on 07/03/2023.  His creatinine is at 4.15, BUN 152, GFR 14.  Likely multifactorial etiology, including dehydration and continuation of diuretics and Divan, UTI, possible ATN due to hypotension.  CT scan  showed bilateral hydroureteronephrosis, but no obstructive stone. -Avoid renal toxic medications Monitor input and output Continue to hold Lasix, Diovan   Paroxysmal atrial fibrillation (HCC): Heart rate 90s --> 70s -Hold metoprolol due to hypotension   Type II diabetes mellitus with renal manifestations [facility]): Patient is taking Jardiance, Humalog, glargine insulin 32 units daily.  Recent A1c 6.8, well-controlled.  Blood sugar elevated 379, but anion gap normal 12.  No DKA. -Sliding scale insulin Diabetic coordinator on board recommending consideration of discontinuing Jardiance at discharge Continue long-acting insulin   Dementia without behavioral disturbance [facility]) -Fall precaution   Hyperlipidemia Continue Lipitor   Thrombocytopenia (HCC): This is chronic issue.  Platelets 76.  No active bleeding Monitor CBC   BPH (benign prostatic hyperplasia) -Continue to hold Flomax due to hypotension   Depression -Continue home medications   Subjective:  Seen and examined at bedside this morning Still having soft blood pressure He admits to improvement compared to upon presentation Denies nausea vomiting abdominal pain chest pain cough  Physical Exam:  General: Elderly male laying in bed in no obvious distress Heme: No neck lymph node enlargement. Cardiac: S1/S2, RRR, No gallops or rubs. Respiratory: No rales, wheezing, rhonchi or rubs. GI: Soft, nondistended, nontender, no rebound pain, no organomegaly, BS present. GU: No hematuria Ext: No pitting leg edema bilaterally. 1+DP/PT pulse bilaterally. Musculoskeletal: No joint deformities, No joint redness or warmth, no limitation of ROM in spin. Skin: No rashes.  Neuro: Alert, following command, cranial nerves II-XII grossly intact, moves all extremities normally. Psych: Patient is not psychotic, no suicidal or hemocidal ideation.     Vitals:   07/19/23 1130 07/19/23 1330 07/19/23 1343 07/19/23 1438  BP: 96/79 124/66 124/66  109/76  Pulse: 87  88 87  Resp: (!) 22 14 19 20   Temp:   97.8 F (36.6 C) 97.8 F (36.6 C)  TempSrc:   Oral Oral  SpO2: 100%  100% 95%  Weight:      Height:        Data Reviewed: Have reviewed CT scan of the abdomen with above findings    Latest Ref Rng & Units 07/19/2023    4:00 AM 07/18/2023    3:48 PM 07/03/2023    4:21 AM  CBC  WBC 4.0 - 10.5 K/uL 6.2  9.7  6.2   Hemoglobin 13.0 - 17.0 g/dL 8.0  9.6  9.8   Hematocrit 39.0 - 52.0 % 22.8  26.7  29.2   Platelets 150 - 400 K/uL 60  76  115        Latest Ref Rng & Units 07/19/2023    1:24 PM 07/19/2023    7:33 AM 07/19/2023    4:00 AM  BMP  Glucose 70 - 99 mg/dL 161   096   BUN 8 - 23 mg/dL 045   409   Creatinine 0.61 - 1.24 mg/dL 8.11   9.14   Sodium 782 - 145 mmol/L 130   127   Potassium 3.5 - 5.1 mmol/L 5.1  4.9  6.4   Chloride 98 - 111 mmol/L 105   104   CO2 22 - 32 mmol/L 16   15   Calcium 8.9 - 10.3 mg/dL 8.0   7.9       Time spent: 57 minutes  Author: Loyce Dys, MD 07/19/2023 5:25 PM  For on call review [email or web address].

## 2023-07-19 NOTE — H&P (Incomplete)
History and Physical    Peter Robertson WUJ:[id number] DOB: 24-Apr-1941 DOA: 07/18/2023  Referring MD/NP/PA:   PCP: Jenell Milliner, MD   Patient coming from:  The patient is coming from home.     Chief Complaint:   HPI: Peter Robertson is a 83 y.o. male with medical history significant of      Data reviewed independently and ED Course: pt was found to have     ***       EKG: I have personally reviewed.  Not done in ED, will get one.   ***   Review of Systems:   General: no fevers, chills, no body weight gain, has poor appetite, has fatigue HEENT: no blurry vision, hearing changes or sore throat Respiratory: no dyspnea, coughing, wheezing CV: no chest pain, no palpitations GI: no nausea, vomiting, abdominal pain, diarrhea, constipation GU: no dysuria, burning on urination, increased urinary frequency, hematuria  Ext: no leg edema Neuro: no unilateral weakness, numbness, or tingling, no vision change or hearing loss Skin: no rash, no skin tear. MSK: No muscle spasm, no deformity, no limitation of range of movement in spin Heme: No easy bruising.  Travel history: No recent long distant travel.   Allergy:  Allergies  Allergen Reactions  . Sulfamethoxazole-Trimethoprim Other (See Comments)    dizziness dizziness dizziness   . Tape Rash    blisters    Past Medical History:  Diagnosis Date  . Asthma   . BPH (benign prostatic hyperplasia)   . Chronic kidney disease    Kidney Stones  . Coronary artery disease   . Diabetes mellitus without complication (HCC)   . Heart murmur   . Hyperlipidemia   . Hypertension   . Myocardial infarction [facility])     Past Surgical History:  Procedure Laterality Date  . COLONOSCOPY WITH PROPOFOL N/A 12/03/2014   Procedure: COLONOSCOPY WITH PROPOFOL;  Surgeon: Scot Jun, MD;  Location: [facility] ENDOSCOPY;  Service: Endoscopy;  Laterality: N/A;  . CORONARY ARTERY BYPASS GRAFT    . LEFT HEART CATH AND CORS/GRAFTS ANGIOGRAPHY N/A  05/22/2018   Procedure: LEFT HEART CATH AND CORS/GRAFTS ANGIOGRAPHY poss PCI;  Surgeon: Antonieta Iba, MD;  Location: [facility] INVASIVE CV LAB;  Service: Cardiovascular;  Laterality: N/A;    Social History:  reports that he quit smoking about 39 years ago. He has never used smokeless tobacco. He reports that he does not drink alcohol and does not use drugs.  Family History:  Family History  Problem Relation Age of Onset  . Diabetes Mother   . Cancer Mother   . Hypertension Father   . Hypertension Brother   . Diabetes Brother      Prior to Admission medications   Medication Sig Start Date End Date Taking? Authorizing Provider  acetaminophen (TYLENOL) 325 MG tablet Take 2 tablets (650 mg total) by mouth every 6 (six) hours as needed for mild pain or fever. 05/17/21   Leeroy Bock, MD  albuterol (PROVENTIL) (2.5 MG/3ML) 0.083% nebulizer solution Take 2.5 mg by nebulization every 4 (four) hours as needed.    [provider]  albuterol (VENTOLIN HFA) 108 (90 Base) MCG/ACT inhaler Inhale 1-2 puffs into the lungs every 4 (four) hours as needed.    [provider]  alum & mag hydroxide-simeth (MAALOX/MYLANTA) 200-200-20 MG/5ML suspension Take 15 mLs by mouth every 6 (six) hours as needed for indigestion or heartburn. 05/23/21   Arnetha Courser, MD  atorvastatin (LIPITOR) 20 MG tablet Take 20 mg  by mouth at bedtime.    [provider]  cholecalciferol (VITAMIN D3) 25 MCG (1000 UT) tablet Take 1,000 Units by mouth daily.    [provider]  docusate sodium (COLACE) 100 MG capsule Take 1 capsule (100 mg total) by mouth 2 (two) times daily as needed for mild constipation. 11/27/22   Arnetha Courser, MD  empagliflozin (JARDIANCE) 10 MG TABS tablet Take 10 mg by mouth daily. 08/10/22   [provider]  feeding supplement (ENSURE ENLIVE / ENSURE PLUS) LIQD Take 237 mLs by mouth 3 (three) times daily between meals. 11/27/22   Arnetha Courser, MD  furosemide (LASIX)  20 MG tablet Take 20 mg by mouth daily. 04/05/22   [provider]  gabapentin (NEURONTIN) 300 MG capsule Take 1 capsule (300 mg total) by mouth 3 (three) times daily. Patient taking differently: Take 300 mg by mouth 2 (two) times daily. 11/27/22   Arnetha Courser, MD  insulin degludec (TRESIBA FLEXTOUCH) 100 UNIT/ML FlexTouch Pen Inject 25 Units into the skin at bedtime.    [provider]  insulin glargine (LANTUS) 100 UNIT/ML injection Inject 0.3 mLs (30 Units total) into the skin daily. Patient not taking: Reported on 06/28/2023 05/17/21   Leeroy Bock, MD  insulin lispro (HUMALOG) 100 UNIT/ML KwikPen Inject 2-12 Units into the skin 3 times daily with meals, bedtime and 2 AM. Sliding scale insulin < 60 = Call MD 200-249 = 2 units 250-299 = 4 units 300-349 = 6 units 350-359 = 8 units 400-449 = 10 units 450-499 = 12 units >500 = Call MD    [provider]  isosorbide mononitrate (IMDUR) 60 MG 24 hr tablet Take 60 mg by mouth daily.    [provider]  leptospermum manuka honey (MEDIHONEY) PSTE paste Apply 1 Application topically daily. For 1 more week 11/28/22   Arnetha Courser, MD  metoprolol succinate (TOPROL-XL) 50 MG 24 hr tablet Take 1 tablet (50 mg total) by mouth daily. Take with or immediately following a meal. 07/03/23 08/02/23  Charise Killian, MD  Multiple Vitamins-Minerals (PRESERVISION AREDS 2+MULTI VIT) CAPS Take 1 capsule by mouth 2 (two) times daily.    [provider]  nitroGLYCERIN (NITROSTAT) 0.4 MG SL tablet Place 0.4 mg under the tongue every 5 (five) minutes as needed for chest pain.     [provider]  Nystatin (GERHARDT'S BUTT CREAM) CREA Apply 1 Application topically 3 (three) times daily as needed for irritation. Patient not taking: Reported on 06/28/2023 11/27/22   Arnetha Courser, MD  ondansetron (ZOFRAN) 4 MG tablet Take 1 tablet (4 mg total) by mouth every 6 (six) hours as needed for nausea. 11/27/22   Arnetha Courser, MD  sertraline (ZOLOFT) 50 MG tablet Take 1 tablet by mouth daily. 08/28/22   [provider]  tamsulosin (FLOMAX) 0.4 MG CAPS capsule Take 0.8 mg by mouth at bedtime.     [provider]  valsartan (DIOVAN) 80 MG tablet Take 80 mg by mouth daily. 09/05/22   [provider]    Physical Exam: Vitals:   07/18/23 1736 07/18/23 1745 07/18/23 1800 07/18/23 1833  BP: (!) 106/50 (!) 86/62 (!) 73/34 (!) 95/42  Pulse:  68 77 79  Resp: (!) 23 20 20 15   Temp:      TempSrc:      SpO2:  100% 94% 97%  Weight:      Height:       General: Not in acute distress HEENT:  Eyes: PERRL, EOMI, no jaundice       ENT: No discharge from the ears and nose, no pharynx injection, no tonsillar enlargement.        Neck: No JVD, no bruit, no mass felt. Heme: No neck lymph node enlargement. Cardiac: S1/S2, RRR, No murmurs, No gallops or rubs. Respiratory: No rales, wheezing, rhonchi or rubs. GI: Soft, nondistended, nontender, no rebound pain, no organomegaly, BS present. GU: No hematuria Ext: No pitting leg edema bilaterally. 1+DP/PT pulse bilaterally. Musculoskeletal: No joint deformities, No joint redness or warmth, no limitation of ROM in spin. Skin: No rashes.  Neuro: Alert, oriented X3, cranial nerves II-XII grossly intact, moves all extremities normally. Muscle strength 5/5 in all extremities, sensation to light touch intact. Brachial reflex 2+ bilaterally. Knee reflex 1+ bilaterally. Negative Babinski's sign. Normal finger to nose test. Psych: Patient is not psychotic, no suicidal or hemocidal ideation.  Labs on Admission: I have personally reviewed following labs and imaging studies  CBC: Recent Labs  Lab 07/18/23 1548  WBC 9.7  NEUTROABS 8.4*  HGB 9.6*  HCT 26.7*  MCV 82.2  PLT 76*   Basic Metabolic Panel: Recent Labs  Lab 07/18/23 1548 07/18/23 1836  NA 122*  --   K 5.9*  --   CL 96*  --   CO2 14*  --   GLUCOSE 399*  --   BUN 152*  --    CREATININE 4.15*  --   CALCIUM 8.2*  --   PHOS  --  4.1   GFR: Estimated Creatinine Clearance: 12.2 mL/min (A) (by C-G formula based on SCr of 4.15 mg/dL (H)). Liver Function Tests: Recent Labs  Lab 07/18/23 1548  AST 10*  ALT 16  ALKPHOS 55  BILITOT 0.5  PROT 5.5*  ALBUMIN 2.7*   No results for input(s): "LIPASE", "AMYLASE" in the last 168 hours. No results for input(s): "AMMONIA" in the last 168 hours. Coagulation Profile: Recent Labs  Lab 07/18/23 1836  INR 1.0   Cardiac Enzymes: No results for input(s): "CKTOTAL", "CKMB", "CKMBINDEX", "TROPONINI" in the last 168 hours. BNP (last 3 results) No results for input(s): "PROBNP" in the last 8760 hours. HbA1C: No results for input(s): "HGBA1C" in the last 72 hours. CBG: Recent Labs  Lab 07/18/23 1539  GLUCAP 379*   Lipid Profile: No results for input(s): "CHOL", "HDL", "LDLCALC", "TRIG", "CHOLHDL", "LDLDIRECT" in the last 72 hours. Thyroid Function Tests: No results for input(s): "TSH", "T4TOTAL", "FREET4", "T3FREE", "THYROIDAB" in the last 72 hours. Anemia Panel: No results for input(s): "VITAMINB12", "FOLATE", "FERRITIN", "TIBC", "IRON", "RETICCTPCT" in the last 72 hours. Urine analysis:    Component Value Date/Time   COLORURINE YELLOW (A) 06/27/2023 1854   APPEARANCEUR TURBID (A) 06/27/2023 1854   LABSPEC 1.014 06/27/2023 1854   PHURINE 5.0 06/27/2023 1854   GLUCOSEU 50 (A) 06/27/2023 1854   HGBUR MODERATE (A) 06/27/2023 1854   BILIRUBINUR NEGATIVE 06/27/2023 1854   KETONESUR NEGATIVE 06/27/2023 1854   PROTEINUR 100 (A) 06/27/2023 1854   NITRITE NEGATIVE 06/27/2023 1854   LEUKOCYTESUR MODERATE (A) 06/27/2023 1854   Sepsis Labs: @LABRCNTIP (procalcitonin:4,lacticidven:4) ) Recent Results (from the past 240 hours)  Resp panel by RT-PCR (RSV, Flu A&B, Covid) Anterior Nasal Swab     Status: None   Collection Time: 07/18/23  4:26 PM   Specimen: Anterior Nasal Swab  Result Value Ref Range Status   SARS  Coronavirus 2 by RT PCR NEGATIVE NEGATIVE Final    Comment: (NOTE) SARS-CoV-2 target nucleic acids are NOT  DETECTED.  The SARS-CoV-2 RNA is generally detectable in upper respiratory specimens during the acute phase of infection. The lowest concentration of SARS-CoV-2 viral copies this assay can detect is 138 copies/mL. A negative result does not preclude SARS-Cov-2 infection and should not be used as the sole basis for treatment or other patient management decisions. A negative result may occur with  improper specimen collection/handling, submission of specimen other than nasopharyngeal swab, presence of viral mutation(s) within the areas targeted by this assay, and inadequate number of viral copies(<138 copies/mL). A negative result must be combined with clinical observations, patient history, and epidemiological information. The expected result is Negative.  Fact Sheet for Patients:  BloggerCourse.com  Fact Sheet for Healthcare Providers:  SeriousBroker.it  This test is no t yet approved or cleared by the [place] FDA and  has been authorized for detection and/or diagnosis of SARS-CoV-2 by FDA under an Emergency Use Authorization (EUA). This EUA will remain  in effect (meaning this test can be used) for the duration of the COVID-19 declaration under Section 564(b)(1) of the Act, 21 U.S.C.section 360bbb-3(b)(1), unless the authorization is terminated  or revoked sooner.       Influenza A by PCR NEGATIVE NEGATIVE Final   Influenza B by PCR NEGATIVE NEGATIVE Final    Comment: (NOTE) The Xpert Xpress SARS-CoV-2/FLU/RSV plus assay is intended as an aid in the diagnosis of influenza from Nasopharyngeal swab specimens and should not be used as a sole basis for treatment. Nasal washings and aspirates are unacceptable for Xpert Xpress SARS-CoV-2/FLU/RSV testing.  Fact Sheet for  Patients: BloggerCourse.com  Fact Sheet for Healthcare Providers: SeriousBroker.it  This test is not yet approved or cleared by the [place] FDA and has been authorized for detection and/or diagnosis of SARS-CoV-2 by FDA under an Emergency Use Authorization (EUA). This EUA will remain in effect (meaning this test can be used) for the duration of the COVID-19 declaration under Section 564(b)(1) of the Act, 21 U.S.C. section 360bbb-3(b)(1), unless the authorization is terminated or revoked.     Resp Syncytial Virus by PCR NEGATIVE NEGATIVE Final    Comment: (NOTE) Fact Sheet for Patients: BloggerCourse.com  Fact Sheet for Healthcare Providers: SeriousBroker.it  This test is not yet approved or cleared by the [place] FDA and has been authorized for detection and/or diagnosis of SARS-CoV-2 by FDA under an Emergency Use Authorization (EUA). This EUA will remain in effect (meaning this test can be used) for the duration of the COVID-19 declaration under Section 564(b)(1) of the Act, 21 U.S.C. section 360bbb-3(b)(1), unless the authorization is terminated or revoked.  Performed at [facility], 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              Yes, patient's    at bed side.       by phone   ***  Disposition Plan:  Anticipate discharge back to previous environment  Consults called:    Admission status and Level of care: Stepdown:    for obs as inpt        Dispo: The patient is from: {From:23814}              Anticipated d/c is to: {To:23815}              Anticipated d/c date is: {Days:23816}              Patient currently {Medically stable:23817}    Severity of Illness:  {Observation/Inpatient:21159}       Date of Service 07/18/2023    Lorretta Harp [facility]   If 7PM-7AM, please contact night-coverage [email or web address] 07/18/2023, 8:08 PM

## 2023-07-19 NOTE — Progress Notes (Addendum)
                                                     Palliative Care Progress Note   Patient Name: Peter Robertson       Date: 07/19/2023 DOB: 10/26/1940  Age: 83 y.o. MRN#: [id number] Attending Physician: Loyce Dys, MD Primary Care Physician: System, Provider Not In Admit Date: 07/18/2023  Extensive chart review completed including labs, vital signs, orders, and progress notes.  As per chart review, patient is active with [facility] hospice services.  I contacted [facility] hospice liaisons [place] [facility] [place].  They are aware of patient's admission under hospice services and plan to follow-up with patient.  PMT will step back and shadow the patient's chart. Please re-engage with PMT when appropriate as [facility] is managing patient's GOC/symptom management during this hospitalization.  Attending Dr. Meriam Sprague made aware via secure chat that PMT received consult and that palliative/hospice needs will be managed by [facility].   Thank you for allowing the Palliative Medicine Team to assist in the care of Peter Robertson.  Samara Deist L. Bonita Quin, DNP, FNP-BC Palliative Medicine Team    No charge

## 2023-07-19 NOTE — ED Notes (Signed)
Pt family request any person contacting hospital facility in regards to this pt be required to use the password "Sparkles" further the person named "[name]" is not to be permitted any medical decision making - Clinical research associate rN notes pt chart however is not aware of definitive hard copy of "Peter Robertson" as any person with the appropriate legal permission to make medical decisions on the part of the pt @ this time

## 2023-07-19 NOTE — ED Notes (Signed)
NP Jon Billings notified pt K+ 6.4

## 2023-07-20 DIAGNOSIS — R6521 Severe sepsis with septic shock: Secondary | ICD-10-CM | POA: Diagnosis not present

## 2023-07-20 DIAGNOSIS — A419 Sepsis, unspecified organism: Secondary | ICD-10-CM | POA: Diagnosis not present

## 2023-07-20 LAB — URINALYSIS, W/ REFLEX TO CULTURE (INFECTION SUSPECTED)
Bilirubin Urine: NEGATIVE
Glucose, UA: 150 mg/dL — AB
Ketones, ur: NEGATIVE mg/dL
Nitrite: NEGATIVE
Protein, ur: 100 mg/dL — AB
RBC / HPF: >50 RBC/hpf (ref 0–5)
Specific Gravity, Urine: 1.011 (ref 1.005–1.030)
Squamous Epithelial / HPF: 0 /[HPF] (ref 0–5)
WBC, UA: >50 WBC/hpf (ref 0–5)
pH: 5 (ref 5.0–8.0)

## 2023-07-20 LAB — BASIC METABOLIC PANEL
Anion gap: 7 (ref 5–15)
Anion gap: 9 (ref 5–15)
BUN: 110 mg/dL — ABNORMAL HIGH (ref 8–23)
BUN: 99 mg/dL — ABNORMAL HIGH (ref 8–23)
CO2: 18 mmol/L — ABNORMAL LOW (ref 22–32)
CO2: 16 mmol/L — ABNORMAL LOW (ref 22–32)
Calcium: 7.9 mg/dL — ABNORMAL LOW (ref 8.9–10.3)
Calcium: 7.7 mg/dL — ABNORMAL LOW (ref 8.9–10.3)
Chloride: 110 mmol/L (ref 98–111)
Chloride: 112 mmol/L — ABNORMAL HIGH (ref 98–111)
Creatinine, Ser: 2.59 mg/dL — ABNORMAL HIGH (ref 0.61–1.24)
Creatinine, Ser: 2.29 mg/dL — ABNORMAL HIGH (ref 0.61–1.24)
GFR, Estimated: 24 mL/min — ABNORMAL LOW (ref >60)
GFR, Estimated: 28 mL/min — ABNORMAL LOW (ref >60)
Glucose, Bld: 130 mg/dL — ABNORMAL HIGH (ref 70–99)
Glucose, Bld: 228 mg/dL — ABNORMAL HIGH (ref 70–99)
Potassium: 5.1 mmol/L (ref 3.5–5.1)
Potassium: 4.7 mmol/L (ref 3.5–5.1)
Sodium: 135 mmol/L (ref 135–145)
Sodium: 137 mmol/L (ref 135–145)

## 2023-07-20 LAB — POTASSIUM
Potassium: 5.2 mmol/L — ABNORMAL HIGH (ref 3.5–5.1)
Potassium: 5.1 mmol/L (ref 3.5–5.1)
Potassium: 4.8 mmol/L (ref 3.5–5.1)
Potassium: 4.5 mmol/L (ref 3.5–5.1)
Potassium: 4.7 mmol/L (ref 3.5–5.1)
Potassium: 4.6 mmol/L (ref 3.5–5.1)

## 2023-07-20 LAB — CBC WITH DIFFERENTIAL/PLATELET
Abs Immature Granulocytes: 0.02 10*3/uL (ref 0.00–0.07)
Basophils Absolute: 0 10*3/uL (ref 0.0–0.1)
Basophils Relative: 0 %
Eosinophils Absolute: 0 10*3/uL (ref 0.0–0.5)
Eosinophils Relative: 0 %
HCT: 26.8 % — ABNORMAL LOW (ref 39.0–52.0)
Hemoglobin: 9.4 g/dL — ABNORMAL LOW (ref 13.0–17.0)
Immature Granulocytes: 0 %
Lymphocytes Relative: 5 %
Lymphs Abs: 0.3 10*3/uL — ABNORMAL LOW (ref 0.7–4.0)
MCH: 29.4 pg (ref 26.0–34.0)
MCHC: 35.1 g/dL (ref 30.0–36.0)
MCV: 83.8 fL (ref 80.0–100.0)
Monocytes Absolute: 0.6 10*3/uL (ref 0.1–1.0)
Monocytes Relative: 10 %
Neutro Abs: 5.2 10*3/uL (ref 1.7–7.7)
Neutrophils Relative %: 85 %
Platelets: 77 10*3/uL — ABNORMAL LOW (ref 150–400)
RBC: 3.2 MIL/uL — ABNORMAL LOW (ref 4.22–5.81)
RDW: 15.1 % (ref 11.5–15.5)
WBC: 6.2 10*3/uL (ref 4.0–10.5)
nRBC: 0 % (ref 0.0–0.2)

## 2023-07-20 LAB — GLUCOSE, CAPILLARY
Glucose-Capillary: 100 mg/dL — ABNORMAL HIGH (ref 70–99)
Glucose-Capillary: 231 mg/dL — ABNORMAL HIGH (ref 70–99)
Glucose-Capillary: 308 mg/dL — ABNORMAL HIGH (ref 70–99)
Glucose-Capillary: 156 mg/dL — ABNORMAL HIGH (ref 70–99)

## 2023-07-20 MED ORDER — MORPHINE SULFATE (PF) 2 MG/ML IV SOLN
2.0000 mg | INTRAVENOUS | Status: DC | PRN
  Administered 2023-07-20 – 2023-07-24 (×12): 2 mg via INTRAVENOUS
  Filled 2023-07-20 (×13): qty 1

## 2023-07-20 MED ORDER — GERHARDT'S BUTT CREAM
TOPICAL_CREAM | Freq: Two times a day (BID) | CUTANEOUS | Status: DC
  Filled 2023-07-20 (×2): qty 60

## 2023-07-20 MED ORDER — HYOSCYAMINE SULFATE ER 0.375 MG PO TB12
0.3750 mg | ORAL_TABLET | Freq: Two times a day (BID) | ORAL | Status: DC
  Administered 2023-07-20 – 2023-07-21 (×3): 0.375 mg via ORAL
  Filled 2023-07-20 (×3): qty 1

## 2023-07-20 NOTE — NC FL2 (Signed)
[place] MEDICAID FL2 LEVEL OF CARE FORM     IDENTIFICATION  Patient Name: Peter Robertson Birthdate: 04-07-1941 Sex: male Admission Date (Current Location): 07/18/2023  [facility] [place] Number:  Chiropodist and Address:  [facility], [address] [place], [place] [address]      Provider Number: [id number]  Attending Physician Name and Address:  Loyce Dys, MD  Relative Name and Phone Number:       Current Level of Care: Hospital Recommended Level of Care: Skilled Nursing Facility (with hospice) Prior Approval Number:    Date Approved/Denied:   PASRR Number: [id number]  Discharge Plan: SNF (with hospice)    Current Diagnoses: Patient Active Problem List   Diagnosis Date Noted   Hypokalemia 07/19/2023   Severe sepsis with septic shock (HCC) 07/18/2023   Myocardial injury 07/18/2023   Hyponatremia 07/18/2023   Hyperkalemia 07/18/2023   UTI (urinary tract infection) 06/27/2023   Type II diabetes mellitus with renal manifestations (HCC) 06/27/2023   Acute renal failure superimposed on stage 3a chronic kidney disease (HCC) 06/27/2023   Sarcoidosis 06/27/2023   Sacral decubitus ulcer, stage III (HCC) 11/27/2022   Cellulitis of buttock 11/27/2022   Type 2 diabetes mellitus without complication, with long-term current use of insulin (HCC) 11/27/2022   Protein-calorie malnutrition, severe 11/21/2022   Renal hematoma, left 11/19/2022   Chronic anticoagulation 11/18/2022   Infected decubitus ulcer 11/18/2022   Hydronephrosis of left kidney s/p nephrostomy tubes (4/2 - 10/03/2022) 11/18/2022   Frequent falls 11/18/2022   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 11/18/2022   Paroxysmal atrial fibrillation (HCC) 11/18/2022   Ureteropelvic junction (UPJ) obstruction 09/10/2022   (HFpEF) heart failure with preserved ejection fraction (HCC) 09/13/2021   Chronic diastolic (congestive) heart failure  (HCC) 05/24/2021   Thyroid nodule greater than or equal to 1 cm in diameter incidentally noted on imaging study    Head injury    Weakness generalized    Acute on chronic diastolic CHF (congestive heart failure) (HCC) 05/07/2021   Acute respiratory failure with hypoxia (HCC) 05/07/2021   Aspiration pneumonia (HCC) 05/06/2021   Pneumonia due to COVID-19 virus 05/06/2021   Dementia without behavioral disturbance (HCC) 05/06/2021   Fall at home, initial encounter 05/06/2021   Hyperlipidemia    Hypertension    Elevated troponin    Chronic diastolic CHF (congestive heart failure) (HCC)    Depression    Stage 3a chronic kidney disease (HCC)    Thyroid nodule    Fall    Encounter for psychological evaluation 02/15/2020   AKI (acute kidney injury) (HCC) 02/12/2020   Vertigo 10/11/2019   BPPV (benign paroxysmal positional vertigo) 10/10/2019   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) 10/10/2019   Uncontrolled type 2 diabetes mellitus with hyperglycemia, with long-term current use of insulin (HCC) 10/10/2019   BPH (benign prostatic hyperplasia) 10/10/2019   Thrombocytopenia (HCC) 10/10/2019   Dark stools 10/10/2019   Unstable angina (HCC) 05/20/2018   Chest pain 01/30/2017    Orientation RESPIRATION BLADDER Height & Weight     Self, Place  O2 (Nasal Cannula 1 L) Continent Weight: 154 lb 5.2 oz (70 kg) Height:  5\' 6"  (167.6 cm)  BEHAVIORAL SYMPTOMS/MOOD NEUROLOGICAL BOWEL NUTRITION STATUS   (None)  (Dementia) Incontinent Diet (Regular)  AMBULATORY STATUS COMMUNICATION OF NEEDS Skin     Verbally Skin abrasions, Bruising, Other (Comment), PU Stage and Appropriate Care (Erythema/redness.)   PU Stage 2 Dressing:  (Left  buttocks: Foam.)                   Personal Care Assistance Level of Assistance              Functional Limitations Info  Sight, Hearing, Speech Sight Info: Adequate Hearing Info: Adequate Speech Info: Adequate    SPECIAL CARE FACTORS  FREQUENCY                       Contractures Contractures Info: Not present    Additional Factors Info  Code Status, Allergies Code Status Info: DNR Allergies Info: Sulfamethoxazole-trimethoprim, Tape           Current Medications (07/20/2023):  This is the current hospital active medication list Current Facility-Administered Medications  Medication Dose Route Frequency Provider Last Rate Last Admin   0.9 %  sodium chloride infusion   Intravenous Continuous Loyce Dys, MD 75 mL/hr at 07/19/23 1649 New Bag at 07/19/23 1649   acetaminophen (TYLENOL) tablet 650 mg  650 mg Oral Q6H PRN Lorretta Harp, MD   650 mg at 07/19/23 2224   albuterol (PROVENTIL) (2.5 MG/3ML) 0.083% nebulizer solution 2.5 mg  2.5 mg Nebulization Q4H PRN Lorretta Harp, MD   2.5 mg at 07/19/23 0641   atorvastatin (LIPITOR) tablet 20 mg  20 mg Oral QHS Lorretta Harp, MD   20 mg at 07/19/23 2224   cefTRIAXone (ROCEPHIN) 1 g in sodium chloride 0.9 % 100 mL IVPB  1 g Intravenous Q24H Lorretta Harp, MD   Stopped at 07/20/23 0558   dextromethorphan-guaiFENesin (MUCINEX DM) 30-600 MG per 12 hr tablet 1 tablet  1 tablet Oral BID PRN Lorretta Harp, MD   1 tablet at 07/18/23 2100   feeding supplement (ENSURE ENLIVE / ENSURE PLUS) liquid 237 mL  237 mL Oral TID BM Lorretta Harp, MD   237 mL at 07/20/23 1316   gabapentin (NEURONTIN) capsule 300 mg  300 mg Oral BID Lorretta Harp, MD   300 mg at 07/20/23 2130   Gerhardt's butt cream   Topical BID Rosezetta Schlatter T, MD       hyoscyamine (LEVBID) 0.375 MG 12 hr tablet 0.375 mg  0.375 mg Oral Q12H Rosezetta Schlatter T, MD   0.375 mg at 07/20/23 1315   insulin aspart (novoLOG) injection 0-5 Units  0-5 Units Subcutaneous QHS Lorretta Harp, MD   5 Units at 07/18/23 2257   insulin aspart (novoLOG) injection 0-9 Units  0-9 Units Subcutaneous TID WC Lorretta Harp, MD   3 Units at 07/20/23 1201   insulin glargine-yfgn (SEMGLEE) injection 33 Units  33 Units Subcutaneous QHS Rosezetta Schlatter T, MD   33 Units at 07/19/23 2228    midodrine (PROAMATINE) tablet 10 mg  10 mg Oral TID WC Lorretta Harp, MD   10 mg at 07/20/23 1201   morphine (PF) 2 MG/ML injection 2 mg  2 mg Intravenous Q4H PRN Rosezetta Schlatter T, MD   2 mg at 07/20/23 1159   ondansetron (ZOFRAN) injection 4 mg  4 mg Intravenous Q8H PRN Lorretta Harp, MD   4 mg at 07/18/23 2101   sertraline (ZOLOFT) tablet 50 mg  50 mg Oral Daily Lorretta Harp, MD   50 mg at 07/20/23 0910   sodium chloride tablet 1 g  1 g Oral BID WC Lorretta Harp, MD   1 g at 07/20/23 0911   sodium zirconium cyclosilicate (LOKELMA) packet 10 g  10 g Oral BID Loyce Dys, MD  10 g at 07/20/23 0908     Discharge Medications: Please see discharge summary for a list of discharge medications.  Relevant Imaging Results:  Relevant Lab Results:   Additional Information SS#: [id number]  Margarito Liner, LCSW

## 2023-07-20 NOTE — Plan of Care (Signed)
  Problem: Nutritional: Goal: Maintenance of adequate nutrition will improve Outcome: Progressing   Problem: Skin Integrity: Goal: Risk for impaired skin integrity will decrease Outcome: Progressing   Problem: Safety: Goal: Ability to remain free from injury will improve Outcome: Progressing

## 2023-07-20 NOTE — TOC Initial Note (Signed)
Transition of Care [facility]) - Initial/Assessment Note    Patient Details  Name: Peter Robertson MRN: [id number] Date of Birth: 09/21/1940  Transition [facility] Care [facility]) CM/SW Contact:    Margarito Liner, LCSW Phone Number: 07/20/2023, 3:07 PM  Clinical Narrative:   Patient not fully oriented. No family at bedside. CSW called neighbor, introduced role, and explained that discharge planning would be discussed. Mrs. [name] confirmed patient is a long-term resident at [facility] and he is active with [employer]. Per MD, patient will likely be stable for discharge by Monday. No further concerns. CSW will continue to follow patient and his neighbor for support and facilitate return to SNF once medically stable.               Expected Discharge Plan: Skilled Nursing Facility (with hospice) Barriers to Discharge: Continued Medical Work up   Patient Goals and CMS Choice            Expected Discharge Plan and Services     Post Acute Care Choice: Skilled Nursing Facility Living arrangements for the past 2 months: Skilled Nursing Facility                                      Prior Living Arrangements/Services Living arrangements for the past 2 months: Skilled Nursing Facility Lives with:: Facility Resident Patient language and need for interpreter reviewed:: Yes Do you feel safe going back to the place where you live?: Yes      Need for Family Participation in Patient Care: Yes (Comment) Care giver support system in place?: Yes (comment)   Criminal Activity/Legal Involvement Pertinent to Current Situation/Hospitalization: No - Comment as needed  Activities of Daily Living   ADL Screening (condition at time of admission) Independently performs ADLs?: No Does the patient have a NEW difficulty with bathing/dressing/toileting/self-feeding that is expected to last >3 days?: No Does the patient have a NEW difficulty with getting in/out of bed, walking, or climbing stairs  that is expected to last >3 days?: No Does the patient have a NEW difficulty with communication that is expected to last >3 days?: No Is the patient deaf or have difficulty hearing?: No Does the patient have difficulty seeing, even when wearing glasses/contacts?: No Does the patient have difficulty concentrating, remembering, or making decisions?: No  Permission Sought/Granted Permission sought to share information with : Facility Medical sales representative, Family Supports Permission granted to share information with : Yes, Verbal Permission Granted  Share Information with NAME: [name]  Permission granted to share info w AGENCY: [facility]  Permission granted to share info w Relationship: Neighbor  Permission granted to share info w Contact Information: [phone]  Emotional Assessment Appearance:: Appears stated age Attitude/Demeanor/Rapport: Engaged Affect (typically observed): Unable to Assess Orientation: : Oriented to Self, Oriented to Place Alcohol / Substance Use: Not Applicable Psych Involvement: No (comment)  Admission diagnosis:  Hypokalemia [E87.6] Severe sepsis with septic shock (HCC) [A41.9, R65.21] Sepsis with acute renal failure and septic shock, due to unspecified organism, unspecified acute renal failure type (HCC) [A41.9, R65.21, N17.9] Patient Active Problem List   Diagnosis Date Noted   Hypokalemia 07/19/2023   Severe sepsis with septic shock (HCC) 07/18/2023   Myocardial injury 07/18/2023   Hyponatremia 07/18/2023   Hyperkalemia 07/18/2023   UTI (urinary tract infection) 06/27/2023   Type II diabetes mellitus with renal manifestations (HCC) 06/27/2023  Acute renal failure superimposed on stage 3a chronic kidney disease (HCC) 06/27/2023   Sarcoidosis 06/27/2023   Sacral decubitus ulcer, stage III (HCC) 11/27/2022   Cellulitis of buttock 11/27/2022   Type 2 diabetes mellitus without complication, with long-term current use of insulin (HCC)  11/27/2022   Protein-calorie malnutrition, severe 11/21/2022   Renal hematoma, left 11/19/2022   Chronic anticoagulation 11/18/2022   Infected decubitus ulcer 11/18/2022   Hydronephrosis of left kidney s/p nephrostomy tubes (4/2 - 10/03/2022) 11/18/2022   Frequent falls 11/18/2022   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 11/18/2022   Paroxysmal atrial fibrillation (HCC) 11/18/2022   Ureteropelvic junction (UPJ) obstruction 09/10/2022   (HFpEF) heart failure with preserved ejection fraction (HCC) 09/13/2021   Chronic diastolic (congestive) heart failure (HCC) 05/24/2021   Thyroid nodule greater than or equal to 1 cm in diameter incidentally noted on imaging study    Head injury    Weakness generalized    Acute on chronic diastolic CHF (congestive heart failure) (HCC) 05/07/2021   Acute respiratory failure with hypoxia (HCC) 05/07/2021   Aspiration pneumonia (HCC) 05/06/2021   Pneumonia due to COVID-19 virus 05/06/2021   Dementia without behavioral disturbance (HCC) 05/06/2021   Fall at home, initial encounter 05/06/2021   Hyperlipidemia    Hypertension    Elevated troponin    Chronic diastolic CHF (congestive heart failure) (HCC)    Depression    Stage 3a chronic kidney disease (HCC)    Thyroid nodule    Fall    Encounter for psychological evaluation 02/15/2020   AKI (acute kidney injury) (HCC) 02/12/2020   Vertigo 10/11/2019   BPPV (benign paroxysmal positional vertigo) 10/10/2019   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 [place] [address] Phone: [phone] Fax: [phone]     Social Drivers of Health (SDOH) Social History: SDOH Screenings   Food Insecurity: No Food Insecurity (07/19/2023)  Housing: Low Risk  (07/19/2023)  Transportation Needs: No Transportation Needs (07/19/2023)  Utilities: Not At Risk (07/19/2023)  Financial Resource Strain: Low Risk  (07/12/2022)   Received from [facility], [facility]  Physical Activity: Insufficiently Active (08/26/2020)   Received from [facility], [facility]  Social Connections: Moderately Isolated (07/19/2023)  Stress: Stress Concern Present (08/26/2020)   Received from [facility], [facility]  Tobacco Use: Medium Risk (07/19/2023)  Health Literacy: High Risk (08/11/2022)   Received from [facility], [facility]   SDOH Interventions:     Readmission Risk Interventions     No data to display

## 2023-07-20 NOTE — Progress Notes (Signed)
[facility]- [facility] hospitalized hospice patient visit   Peter Robertson is a current [facility] patient with a terminal diagnosis of Cerebrovascular Disease. He resides at [facility] and over the last 24 hours had developed high blood glucose levels and worsening mentation. Facility provider made decision to send him to the ED for further evaluation. He was admitted 1.29.25 with diagnosis of septic shock related to UTI. Per [name] with [facility] this is a related admission.   Visited with patient at bedside.  Patient sitting up in bed, alert with 02 in place.  Patient stated that he felt "fine" this morning.  He no concerns or complaints.  Left patient's primary caregiver, [name], a message to call if she had questions or concerns.     Patient remains inpatient appropriate with need for IVAB and fluids.    Vital Signs: 97.4,  127/78  97  18    spO2 87% on 1L/MIN  I&O: NOT RECORDED/475 Abnormal labs: glucose 100  C02 18  BUN 110   Creatinine 2.59  Calcium 7.9  GFR 24  RBC 3.20  Hemoglobin 9.4  HCT 26.8  Platelets 77   Diagnostics:  No new diagnostics       IV/PRN Meds: NS 2.5L bolus and continuous @125 /H IV, Rocephin 1g IV q24H   Problem List from  [name] on 1.30.25  Severe sepsis with septic shock due to UTI: Patient presented with severe sepsis with septic shock, with hypothermia temperature 95.7, heart rate 95, RR 24.  Patient has been persistently hypotensive.  Lactic acid is up to 2.5.  Has worsening renal function.  CT scan negative for obstructive stone. 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  No chest pain -Continue Lipitor   Hypertension -Hold all blood pressure medications due to hypotension   Hyponatremia likely secondary to hypovolemia Continue IV fluid   Hyperkalemia:-Improving -Continue leukoma 10 g   Chronic diastolic CHF (congestive heart failure) (HCC): 2D echo on 07/21/2022 showed EF > 55% with grade 2 diastolic dysfunction.  Patient is clinically dry.  BNP 56.5 -Hold Lasix   Acute renal failure superimposed on stage 3a chronic kidney disease (HCC): Baseline creatinine 2.18 on 07/03/2023.  His creatinine is at 4.15, BUN 152, GFR 14.  Likely multifactorial etiology, including dehydration and continuation of diuretics and Divan, UTI, possible ATN due to hypotension.  CT scan showed bilateral hydroureteronephrosis, but no obstructive stone. -Avoid renal toxic medications Monitor input and output Continue to hold Lasix, Diovan   Paroxysmal atrial fibrillation (HCC): Heart rate 90s --> 70s -Hold metoprolol due to hypotension   Type II diabetes mellitus with renal manifestations [facility]): Patient is taking Jardiance, Humalog, glargine insulin 32 units daily.  Recent A1c 6.8, well-controlled.  Blood sugar elevated 379, but anion gap normal 12.  No DKA. -Sliding scale insulin Diabetic coordinator on board recommending consideration of discontinuing Jardiance at discharge Continue long-acting insulin   Dementia without behavioral disturbance [name]) -Fall precaution   Hyperlipidemia Continue Lipitor   Thrombocytopenia (HCC): This is chronic issue.  Platelets 76.  No active bleeding Monitor CBC   BPH (benign prostatic hyperplasia) -Continue to  hold Flomax due to hypotension   Depression -Continue home medications      Discharge Planning: Ongoing, currently CG desires treatment for current infection. Return to [facility] at discharge. DC possible on Monday.   Family Contact:  Voicemail left for [employer].  IDT:  Updated Goals of Care: DNR  [facility] Liaison [phone]

## 2023-07-20 NOTE — Plan of Care (Signed)

## 2023-07-20 NOTE — Progress Notes (Signed)
Progress Note   Patient: Peter Robertson UJW:[id number] DOB: 01-30-1941 DOA: 07/18/2023     2 DOS: the patient was seen and examined on 07/20/2023     Brief hospital course: Peter Robertson is a 83 y.o. male with medical history significant of hypertension, hyperlipidemia, diabetes mellitus, asthma, GERD, CAD, myocardial infarction, CKD-3A, BPH, BPV, vertigo, thrombocytopenia, dementia, dCHF, kidney stone, A-fib not on anticoagulants, who presents with weakness.   Assessment and Plan:    Severe sepsis with septic shock due to UTI: Patient presented with severe sepsis with septic shock, with hypothermia temperature 95.7, heart rate 95, RR 24.  Patient has been persistently hypotensive.  Lactic acid is up to 2.5.  Has worsening renal function.  CT scan negative for obstructive stone. 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  No chest pain -Continue Lipitor   Hypertension -Hold all blood pressure medications due to hypotension   Hyponatremia likely secondary to hypovolemia Continue IV fluid   Hyperkalemia:-Improving -Continue leukoma 10 g   Chronic diastolic CHF (congestive heart failure) (HCC): 2D echo on 07/21/2022 showed EF > 55% with grade 2 diastolic dysfunction.  Patient is clinically dry.  BNP 56.5 Continue to hold Lasix   Acute renal failure superimposed on stage 3a chronic kidney disease (HCC): Baseline creatinine 2.18 on 07/03/2023.  His creatinine is at 4.15, BUN 152, GFR 14.  Likely multifactorial etiology, including dehydration and continuation of diuretics and Divan, UTI, possible ATN due to  hypotension.  CT scan showed bilateral hydroureteronephrosis, but no obstructive stone. -Avoid renal toxic medications Monitor input and output Continue to hold Lasix, Diovan   Paroxysmal atrial fibrillation (HCC): Heart rate 90s --> 70s -Hold metoprolol due to hypotension   Type II diabetes mellitus with renal manifestations [facility]): Patient is taking Jardiance, Humalog, glargine insulin 32 units daily.  Recent A1c 6.8, well-controlled.  Blood sugar elevated 379, but anion gap normal 12.  No DKA. -Sliding scale insulin Diabetic coordinator on board recommending consideration of discontinuing Jardiance at discharge Continue long-acting insulin   Dementia without behavioral disturbance [facility]) -Fall precaution   Hyperlipidemia Continue Lipitor   Thrombocytopenia (HCC): This is chronic issue.  Platelets 76.  No active bleeding Monitor CBC   BPH (benign prostatic hyperplasia) Continue to hold Flomax due to hypotension   Depression -Continue home medications     Subjective:  Seen and examined at bedside this morning Patient did have some complaints of bladder spasm as well as some burning sensation when peeing with leakage from the catheter site Foley catheter have been discontinued He denies nausea vomiting   Physical Exam:   General: Elderly male laying in bed in no obvious distress Heme: No neck lymph node enlargement. Cardiac: S1/S2, RRR, No gallops or rubs. Respiratory: No rales, wheezing, rhonchi or rubs. GI: Soft, nondistended, nontender, no rebound pain, no organomegaly, BS present. GU: No hematuria Ext: No pitting leg edema bilaterally. 1+DP/PT pulse bilaterally. Musculoskeletal: No joint deformities, No joint redness or warmth, no limitation of ROM in spin. Skin: No rashes.  Neuro: Alert, following command, cranial nerves II-XII grossly intact, moves all extremities normally. Psych: Patient is not psychotic, no suicidal or hemocidal ideation.  Data  Reviewed: Have reviewed CT scan of the abdomen with above findings    Latest Ref Rng & Units 07/20/2023    4:04 AM 07/19/2023    4:00 AM 07/18/2023    3:48 PM  CBC  WBC 4.0 - 10.5 K/uL 6.2  6.2  9.7   Hemoglobin 13.0 - 17.0 g/dL 9.4  8.0  9.6   Hematocrit 39.0 - 52.0 % 26.8  22.8  26.7   Platelets 150 - 400 K/uL 77  60  76        Latest Ref Rng & Units 07/20/2023    2:20 PM 07/20/2023   12:53 PM 07/20/2023   10:34 AM  BMP  Glucose 70 - 99 mg/dL  213    BUN 8 - 23 mg/dL  99    Creatinine 0.86 - 1.24 mg/dL  5.78    Sodium 469 - 629 mmol/L  137    Potassium 3.5 - 5.1 mmol/L 4.5  4.7  4.8   Chloride 98 - 111 mmol/L  112    CO2 22 - 32 mmol/L  16    Calcium 8.9 - 10.3 mg/dL  7.7       Vitals:   52/84/13 1848 07/20/23 0135 07/20/23 0829 07/20/23 1506  BP: 122/72 124/80 127/78 (!) 140/96  Pulse: 88 90 97 (!) 111  Resp: 18 18 18 20   Temp: (!) 97.5 F (36.4 C) 97.6 F (36.4 C) (!) 97.4 F (36.3 C) 97.7 F (36.5 C)  TempSrc: Oral  Oral Oral  SpO2: 100% 100% (!) 87% 100%  Weight:      Height:         Author: Loyce Dys, MD 07/20/2023 3:50 PM  For on call review [email or web address].

## 2023-07-21 DIAGNOSIS — R6521 Severe sepsis with septic shock: Secondary | ICD-10-CM | POA: Diagnosis not present

## 2023-07-21 DIAGNOSIS — A419 Sepsis, unspecified organism: Secondary | ICD-10-CM | POA: Diagnosis not present

## 2023-07-21 LAB — CBC WITH DIFFERENTIAL/PLATELET
Abs Immature Granulocytes: 0.02 10*3/uL (ref 0.00–0.07)
Basophils Absolute: 0 10*3/uL (ref 0.0–0.1)
Basophils Relative: 0 %
Eosinophils Absolute: 0.1 10*3/uL (ref 0.0–0.5)
Eosinophils Relative: 1 %
HCT: 25.8 % — ABNORMAL LOW (ref 39.0–52.0)
Hemoglobin: 8.7 g/dL — ABNORMAL LOW (ref 13.0–17.0)
Immature Granulocytes: 0 %
Lymphocytes Relative: 6 %
Lymphs Abs: 0.4 10*3/uL — ABNORMAL LOW (ref 0.7–4.0)
MCH: 29.2 pg (ref 26.0–34.0)
MCHC: 33.7 g/dL (ref 30.0–36.0)
MCV: 86.6 fL (ref 80.0–100.0)
Monocytes Absolute: 0.9 10*3/uL (ref 0.1–1.0)
Monocytes Relative: 14 %
Neutro Abs: 4.7 10*3/uL (ref 1.7–7.7)
Neutrophils Relative %: 79 %
Platelets: 68 10*3/uL — ABNORMAL LOW (ref 150–400)
RBC: 2.98 MIL/uL — ABNORMAL LOW (ref 4.22–5.81)
RDW: 15.5 % (ref 11.5–15.5)
WBC: 6 10*3/uL (ref 4.0–10.5)
nRBC: 0 % (ref 0.0–0.2)

## 2023-07-21 LAB — POTASSIUM
Potassium: 4.7 mmol/L (ref 3.5–5.1)
Potassium: 4.6 mmol/L (ref 3.5–5.1)
Potassium: 4.7 mmol/L (ref 3.5–5.1)
Potassium: 4.4 mmol/L (ref 3.5–5.1)

## 2023-07-21 LAB — GLUCOSE, CAPILLARY
Glucose-Capillary: 98 mg/dL (ref 70–99)
Glucose-Capillary: 198 mg/dL — ABNORMAL HIGH (ref 70–99)
Glucose-Capillary: 174 mg/dL — ABNORMAL HIGH (ref 70–99)
Glucose-Capillary: 136 mg/dL — ABNORMAL HIGH (ref 70–99)

## 2023-07-21 NOTE — TOC Progression Note (Signed)
[facility]) - Progression Note    Patient Details  Name: Peter Robertson MRN: [id number] Date of Birth: 02-Aug-1940  Transition of Care [facility]) CM/SW Contact  Liliana Cline, LCSW Phone Number: 07/21/2023, 3:27 PM  Clinical Narrative:    Call to [name] (neighbor) to discuss DC planning. Left a VM requesting a return call.    Expected Discharge Plan: Skilled Nursing Facility (with hospice) Barriers to Discharge: Continued Medical Work up  Expected Discharge Plan and Services     Post Acute Care Choice: Skilled Nursing Facility Living arrangements for the past 2 months: Skilled Nursing Facility                                       Social Determinants of Health (SDOH) Interventions SDOH Screenings   Food Insecurity: No Food Insecurity (07/19/2023)  Housing: Low Risk  (07/19/2023)  Transportation Needs: No Transportation Needs (07/19/2023)  Utilities: Not At Risk (07/19/2023)  Financial Resource Strain: Low Risk  (07/12/2022)   Received from [facility], [facility]  Physical Activity: Insufficiently Active (08/26/2020)   Received from [facility], [facility]  Social Connections: Moderately Isolated (07/19/2023)  Stress: Stress Concern Present (08/26/2020)   Received from [facility], [facility]  Tobacco Use: Medium Risk (07/19/2023)  Health Literacy: High Risk (08/11/2022)   Received from [facility], [facility]    Readmission Risk Interventions     No data to display

## 2023-07-21 NOTE — Plan of Care (Signed)
  Problem: Education: Goal: Ability to describe self-care measures that may prevent or decrease complications (Diabetes Survival Skills Education) will improve Outcome: Not Progressing   Problem: Metabolic: Goal: Ability to maintain appropriate glucose levels will improve Outcome: Not Progressing   Problem: Nutritional: Goal: Maintenance of adequate nutrition will improve Outcome: Not Progressing   Problem: Activity: Goal: Risk for activity intolerance will decrease Outcome: Not Progressing

## 2023-07-21 NOTE — Progress Notes (Signed)
Progress Note   Patient: Peter Robertson:[id number] DOB: 02/08/1941 DOA: 07/18/2023     3 DOS: the patient was seen and examined on 07/21/2023      Brief hospital course: Peter Robertson is a 83 y.o. male with medical history significant of hypertension, hyperlipidemia, diabetes mellitus, asthma, GERD, CAD, myocardial infarction, CKD-3A, BPH, BPV, vertigo, thrombocytopenia, dementia, dCHF, kidney stone, A-fib not on anticoagulants, who presents with weakness.   Assessment and Plan:    Severe sepsis with septic shock due to UTI: Patient presented with severe sepsis with septic shock, with hypothermia temperature 95.7, heart rate 95, RR 24.  Patient has been persistently hypotensive.  Lactic acid is up to 2.5.  Has worsening renal function.  CT scan negative for obstructive stone. 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  No chest pain -Continue Lipitor   Hypertension -Hold all blood pressure medications due to hypotension   Hyponatremia likely secondary to hypovolemia Continue IV fluid   Hyperkalemia:-Improving Lokelma discontinue   Chronic diastolic CHF (congestive heart failure) (HCC): 2D echo on 07/21/2022 showed EF > 55% with grade 2 diastolic dysfunction.  Patient is clinically dry.  BNP 56.5 Continue to hold Lasix   Acute renal failure superimposed on stage 3a chronic kidney disease (HCC): Baseline creatinine 2.18 on 07/03/2023.  His creatinine is at 4.15, BUN 152, GFR 14.  Likely multifactorial etiology, including dehydration and continuation of diuretics and Divan, UTI, possible ATN due to  hypotension.  CT scan showed bilateral hydroureteronephrosis, but no obstructive stone. -Avoid renal toxic medications Monitor input and output Continue to hold Lasix, Diovan   Paroxysmal atrial fibrillation (HCC): Heart rate 90s --> 70s -Hold metoprolol due to hypotension   Type II diabetes mellitus with renal manifestations Peter Robertson): Patient is taking Jardiance, Humalog, glargine insulin 32 units daily.  Recent A1c 6.8, well-controlled.  Blood sugar elevated 379, but anion gap normal 12.  No DKA. Continue sliding scale Diabetic coordinator on board recommending consideration of discontinuing Jardiance at discharge Continue long-acting insulin   Dementia without behavioral disturbance (HCC) Continue fall precaution   Hyperlipidemia Continue Lipitor   Thrombocytopenia (HCC): This is chronic issue.  Platelets 76.  No active bleeding Monitor CBC   BPH (benign prostatic hyperplasia) Continue to hold Flomax due to hypotension   Depression Continue home medications     Subjective:  Patient seen and examined at bedside this morning Denies nausea vomiting abdominal pain chest pain cough  Physical Exam:   General: Elderly male laying in bed in no obvious distress Heme: No neck lymph node enlargement. Cardiac: S1/S2, RRR, No gallops or rubs. Respiratory: No rales, wheezing, rhonchi or rubs. GI: Soft, nondistended, nontender, no rebound pain, no organomegaly, BS present. GU: No hematuria Ext: No pitting leg edema bilaterally. 1+DP/PT pulse bilaterally. Musculoskeletal: No joint deformities, No joint redness or warmth, no limitation of ROM in spin. Skin: No rashes.  Neuro: Alert, following command, cranial nerves II-XII grossly intact Psych: Patient is not psychotic, no suicidal or hemocidal ideation.       Data Reviewed:    Latest Ref Rng & Units 07/21/2023    1:31 PM 07/21/2023    9:43 AM  07/21/2023    6:20 AM  BMP  Potassium 3.5 - 5.1 mmol/L 4.4  4.7  4.6     Vitals:    07/21/23 0458 07/21/23 0544 07/21/23 0812 07/21/23 1516  BP:  113/61 (!) 99/57 (!) 123/57  Pulse:  (!) 105 74 94  Resp:  20 18 19   Temp:  98.4 F (36.9 C) 98.3 F (36.8 C) 98.3 F (36.8 C)  TempSrc:  Oral    SpO2:  100% 100% 100%  Weight: 70.7 kg     Height:          Latest Ref Rng & Units 07/21/2023    1:21 AM 07/20/2023    4:04 AM 07/19/2023    4:00 AM  CBC  WBC 4.0 - 10.5 K/uL 6.0  6.2  6.2   Hemoglobin 13.0 - 17.0 g/dL 8.7  9.4  8.0   Hematocrit 39.0 - 52.0 % 25.8  26.8  22.8   Platelets 150 - 400 K/uL 68  77  60      Author: Loyce Dys, MD 07/21/2023 5:36 PM  For on call review [email or web address].

## 2023-07-21 NOTE — Progress Notes (Signed)
[facility] hospitalized hospice patient visit   [name] is a current [facility] patient with a terminal diagnosis of Cerebrovascular Disease. He resides at [facility] and over the last 24 hours had developed high blood glucose levels and worsening mentation. Facility provider made decision to send him to the ED for further evaluation. He was admitted 1.29.25 with diagnosis of septic shock related to UTI. Per [name] with [facility] this is a related admission.   Patient resting in bed.  No family present.  Patient is alert and pleasant.  He knows who he is and where he is, but does not really seem to understand what is going on.  He is not eating very much, but patient also requires assistance in feeding.  Patient remains on IV antibiotics and has required a few doses of IV morphine for pain.  Called caregiver, Peter Robertson with no returned call.  No plans for discharge this weekend.  Patient is inpatient appropriate with need for IVAB and fluids.    Vital Signs:  T- 98.4, BP 113/61, P 105, R 20,  spO2 100% room air  I&O: Intake , Output , Net -  Abnormal labs:  RBC 2.98HGB 8.7, HCT 25.8, Plat 68, Glucose 174  Diagnostics:  No New    IV/PRN Meds:  NS 2.5L bolus and continuous @125 /H IV, Albumin 25g IV once, Cefepime 2g IV once, Rocephin 1g IV q24H, Zofran 4mg  IV   Problem List: per progress note of Peter Schlatter MD 2.1.25  Severe sepsis with septic shock due to UTI: Patient presented with severe sepsis with septic shock, with hypothermia temperature 95.7, heart rate 95, RR 24.  Patient has been persistently hypotensive.  Lactic acid is up to 2.5.  Has worsening renal function.  CT scan negative for obstructive stone. CT scan showed moderate bilateral hydroureteronephrosis but no stone seen Continue IV fluid Continue current antibiotics Continue midodrine 10 mg 3 times daily -Patient received one-time dose of Solu-Cortef 100 mg Follow-up  on culture results Patient currently being followed by outpatient hospice   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) and Myocardial injury: Troponin is minimally elevated 54 --> 45.  No chest pain -Continue Lipitor   Hypertension -Hold all blood pressure medications due to hypotension   Hyponatremia likely secondary to hypovolemia Continue IV fluid   Hyperkalemia:-Improving Lokelma discontinue   Chronic diastolic CHF (congestive heart failure) (HCC): 2D echo on 07/21/2022 showed EF > 55% with grade 2 diastolic dysfunction.  Patient is clinically dry.  BNP 56.5 Continue to hold Lasix   Acute renal failure superimposed on stage 3a chronic kidney disease (HCC): Baseline creatinine 2.18 on 07/03/2023.  His creatinine is at 4.15, BUN 152, GFR 14.  Likely multifactorial etiology, including dehydration and continuation of diuretics and Divan, UTI, possible ATN due to hypotension.  CT scan showed bilateral hydroureteronephrosis, but no obstructive stone. -Avoid renal toxic medications Monitor input and output Continue to hold Lasix, Diovan   Paroxysmal atrial fibrillation (HCC): Heart rate 90s --> 70s -Hold metoprolol due to hypotension   Type II diabetes mellitus with renal manifestations [facility]): Patient is taking Jardiance, Humalog, glargine insulin 32 units daily.  Recent A1c 6.8, well-controlled.  Blood sugar elevated 379, but anion gap normal 12.  No DKA. Continue sliding scale Diabetic coordinator on board recommending consideration of discontinuing Jardiance at discharge Continue long-acting insulin   Dementia without behavioral disturbance (HCC) Continue fall precaution   Hyperlipidemia Continue Lipitor   Thrombocytopenia (HCC):  This is chronic issue.  Platelets 76.  No active bleeding Monitor CBC   BPH (benign prostatic hyperplasia) Continue to hold Flomax due to hypotension   Depression Continue home medications Discharge Planning: Ongoing, currently CG  desires treatment for current infection Family Contact: No family present.  Called friend, Peter Robertson,  with no returned call.   IDT: Ongoing Goals of Care: DNR  Peter Cross, RN Nurse Liaison [phone]

## 2023-07-22 DIAGNOSIS — R6521 Severe sepsis with septic shock: Secondary | ICD-10-CM | POA: Diagnosis not present

## 2023-07-22 DIAGNOSIS — A419 Sepsis, unspecified organism: Secondary | ICD-10-CM | POA: Diagnosis not present

## 2023-07-22 LAB — GLUCOSE, CAPILLARY
Glucose-Capillary: 103 mg/dL — ABNORMAL HIGH (ref 70–99)
Glucose-Capillary: 216 mg/dL — ABNORMAL HIGH (ref 70–99)
Glucose-Capillary: 261 mg/dL — ABNORMAL HIGH (ref 70–99)
Glucose-Capillary: 142 mg/dL — ABNORMAL HIGH (ref 70–99)

## 2023-07-22 LAB — CBC WITH DIFFERENTIAL/PLATELET
Abs Immature Granulocytes: 0.05 10*3/uL (ref 0.00–0.07)
Basophils Absolute: 0 10*3/uL (ref 0.0–0.1)
Basophils Relative: 0 %
Eosinophils Absolute: 0.1 10*3/uL (ref 0.0–0.5)
Eosinophils Relative: 1 %
HCT: 24.7 % — ABNORMAL LOW (ref 39.0–52.0)
Hemoglobin: 8.6 g/dL — ABNORMAL LOW (ref 13.0–17.0)
Immature Granulocytes: 1 %
Lymphocytes Relative: 8 %
Lymphs Abs: 0.6 10*3/uL — ABNORMAL LOW (ref 0.7–4.0)
MCH: 29.3 pg (ref 26.0–34.0)
MCHC: 34.8 g/dL (ref 30.0–36.0)
MCV: 84 fL (ref 80.0–100.0)
Monocytes Absolute: 1 10*3/uL (ref 0.1–1.0)
Monocytes Relative: 14 %
Neutro Abs: 5.5 10*3/uL (ref 1.7–7.7)
Neutrophils Relative %: 76 %
Platelets: 88 10*3/uL — ABNORMAL LOW (ref 150–400)
RBC: 2.94 MIL/uL — ABNORMAL LOW (ref 4.22–5.81)
RDW: 15.5 % (ref 11.5–15.5)
WBC: 7.1 10*3/uL (ref 4.0–10.5)
nRBC: 0 % (ref 0.0–0.2)

## 2023-07-22 LAB — BASIC METABOLIC PANEL
Anion gap: 7 (ref 5–15)
BUN: 73 mg/dL — ABNORMAL HIGH (ref 8–23)
CO2: 17 mmol/L — ABNORMAL LOW (ref 22–32)
Calcium: 7.9 mg/dL — ABNORMAL LOW (ref 8.9–10.3)
Chloride: 110 mmol/L (ref 98–111)
Creatinine, Ser: 2 mg/dL — ABNORMAL HIGH (ref 0.61–1.24)
GFR, Estimated: 33 mL/min — ABNORMAL LOW (ref >60)
Glucose, Bld: 123 mg/dL — ABNORMAL HIGH (ref 70–99)
Potassium: 4.4 mmol/L (ref 3.5–5.1)
Sodium: 134 mmol/L — ABNORMAL LOW (ref 135–145)

## 2023-07-22 LAB — URINE CULTURE

## 2023-07-22 MED ORDER — ORAL CARE MOUTH RINSE: 15.0000 mL | OROMUCOSAL | Status: DC | PRN

## 2023-07-22 MED ORDER — CEPHALEXIN 500 MG PO CAPS
500.0000 mg | ORAL_CAPSULE | Freq: Two times a day (BID) | ORAL | Status: AC
  Administered 2023-07-23 (×2): 500 mg via ORAL
  Filled 2023-07-22 (×2): qty 1

## 2023-07-22 NOTE — Plan of Care (Signed)
  Problem: Education: Goal: Ability to describe self-care measures that may prevent or decrease complications (Diabetes Survival Skills Education) will improve Outcome: Not Progressing   Problem: Coping: Goal: Ability to adjust to condition or change in health will improve Outcome: Not Progressing   Problem: Safety: Goal: Ability to remain free from injury will improve Outcome: Progressing   Problem: Skin Integrity: Goal: Risk for impaired skin integrity will decrease Outcome: Progressing

## 2023-07-22 NOTE — Plan of Care (Signed)
Patient awake and alert, redirecting needed. No distress noted. All needs attended to. Frequent repositioning done. Patient resting. Will continue to monitor.   Problem: Fluid Volume: Goal: Ability to maintain a balanced intake and output will improve Outcome: Not Progressing   Problem: Nutritional: Goal: Maintenance of adequate nutrition will improve Outcome: Not Progressing   Problem: Skin Integrity: Goal: Risk for impaired skin integrity will decrease Outcome: Not Progressing   Problem: Clinical Measurements: Goal: Ability to maintain clinical measurements within normal limits will improve Outcome: Not Progressing   Problem: Pain Managment: Goal: General experience of comfort will improve and/or be controlled Outcome: Not Progressing   Problem: Safety: Goal: Ability to remain free from injury will improve Outcome: Not Progressing

## 2023-07-22 NOTE — Progress Notes (Signed)
[facility] hospitalized hospice patient visit   [name] is a current [facility] patient with a terminal diagnosis of Cerebrovascular Disease. He resides at [facility] and over the last 24 hours had developed high blood glucose levels and worsening mentation. Facility provider made decision to send him to the ED for further evaluation. He was admitted 1.29.25 with diagnosis of septic shock related to UTI. Per [name] with [facility] this is a related admission.   Met with [name] at the bedside.  He is sitting up in the bed finishing his lunch.  He said none of his family had been to see him since he's been in the hospital.  He is hoping [name] will take him home- not facility.  I called and left another message for [name] today.  Helped patient find a TV station.  He is transitioning from IV antibiotics today to PO.  He had some nausea earlier and had Zofran IV.  Patient is inpatient appropriate with need of IV morphine for pain, IV zofran for nausea.  Patient requires ongoing skilled care to monitor effectiveness of interventions for symptoms.   Vital Signs:  T- 97.8 oral, BP 114/63, P 109, R 16,  spO2 100% room air   I&O: no new reported   Abnormal labs: Na 134, CO2 17, glucose 123, BUN 73, Creat 2.0, Ca 7.9, GFR 33, RBC 2.94, HGB 8.6, HCT 24.7, Plate 88, Lymphs Abs 0.6   Diagnostics:  No New    IV/PRN Meds:  Zofran 4mg  IV for nausea today Rocephin 1g today then discontinued and switching to oral. Morphine 2mg  today @ 1339   Problem List:   Severe sepsis with septic shock related to UTI CAD s/p two vessell CABG 2011, CAD & Myocardial injury Hypertension Hyponatremia likely secondary to hypovolemia Hyperkalemia- resolved Acute Renal Failure superimposed on stage 3a chronic kidney disease Paroxysmal Atrial Fibrillation Type II diabetes with renal manifestations Dementia without behavorial disturbance Hyperlipidemia Thrombocytopenia-  chronic condition Benign Prostatic Hyperplasia Depression   Discharge Planning: Ongoing, currently CG desires treatment for current infection.  Patient transitioning to oral antibiotics tomorrow.  Will likely discharge over next day or 2.    Family Contact: No family present.  Called friend, Babette Relic.  Left a message with no returned call.    IDT: Updated  Goals of Care: DNR   Norris Cross, RN Nurse Liaison [phone]

## 2023-07-22 NOTE — Progress Notes (Signed)
Progress Note   Patient: Peter Robertson:[id number] DOB: 09-Mar-1941 DOA: 07/18/2023     4 DOS: the patient was seen and examined on 07/22/2023     Brief hospital course: Peter Robertson is a 83 y.o. male with medical history significant of hypertension, hyperlipidemia, diabetes mellitus, asthma, GERD, CAD, myocardial infarction, CKD-3A, BPH, BPV, vertigo, thrombocytopenia, dementia, dCHF, kidney stone, A-fib not on anticoagulants, who presents with weakness.   Assessment and Plan:    Severe sepsis with septic shock due to UTI: Patient presented with severe sepsis with septic shock, with hypothermia temperature 95.7, heart rate 95, RR 24.  Patient has been persistently hypotensive.  Lactic acid is up to 2.5.  Has worsening renal function.  CT scan negative for obstructive stone. 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  No chest pain Continue Lipitor   Hypertension -Hold all blood pressure medications due to hypotension   Hyponatremia likely secondary to hypovolemia Continue IV fluid   Hyperkalemia:-Improving Lokelma discontinue   Chronic diastolic CHF (congestive heart failure) (HCC): 2D echo on 07/21/2022 showed EF > 55% with grade 2 diastolic dysfunction.  Patient is clinically dry.  BNP 56.5 Continue to hold Lasix   Acute renal failure superimposed on stage 3a chronic kidney disease (HCC): Baseline creatinine 2.18 on 07/03/2023.  His creatinine is at 4.15, BUN 152, GFR 14.  Likely multifactorial etiology, including dehydration and continuation of diuretics and Divan, UTI, possible ATN due to hypotension.   CT scan showed bilateral hydroureteronephrosis, but no obstructive stone. -Avoid renal toxic medications Monitor input and output Continue to hold Lasix, Diovan   Paroxysmal atrial fibrillation (HCC): Heart rate 90s --> 70s -Hold metoprolol due to hypotension   Type II diabetes mellitus with renal manifestations [name]): Patient is taking Jardiance, Humalog, glargine insulin 32 units daily.  Recent A1c 6.8, well-controlled.  Blood sugar elevated 379, but anion gap normal 12.  No DKA. Continue sliding scale Diabetic coordinator on board recommending consideration of discontinuing Jardiance at discharge Continue long-acting insulin   Dementia without behavioral disturbance (HCC) Continue fall precaution   Hyperlipidemia Continue Lipitor   Thrombocytopenia (HCC): This is chronic issue.  Platelets 76.  No active bleeding Monitor CBC   BPH (benign prostatic hyperplasia) Continue to hold Flomax due to hypotension   Depression Continue home medications     Subjective:  Patient seen and examined at bedside this morning Continue denying worsening respiratory function chest pain or cough   Physical Exam:   General: Elderly male laying in bed in no obvious distress Heme: No neck lymph node enlargement. Cardiac: S1/S2, RRR, No gallops or rubs. Respiratory: No rales, wheezing, rhonchi or rubs. GI: Soft, nondistended, nontender, no rebound pain, no organomegaly, BS present. GU: No hematuria Ext: No pitting leg edema bilaterally. 1+DP/PT pulse bilaterally. Musculoskeletal: No joint deformities, No joint redness or warmth, no limitation of ROM in spin. Skin: No rashes.  Neuro: Alert, following command, cranial nerves II-XII grossly intact Psych: Patient is not psychotic, no suicidal or hemocidal ideation.       Data Reviewed:    Latest Ref Rng & Units 07/22/2023    5:47 AM 07/21/2023    1:21  AM 07/20/2023    4:04 AM  CBC  WBC 4.0 - 10.5 K/uL 7.1  6.0  6.2   Hemoglobin 13.0 - 17.0  g/dL 8.6  8.7  9.4   Hematocrit 39.0 - 52.0 % 24.7  25.8  26.8   Platelets 150 - 400 K/uL 88  68  77        Latest Ref Rng & Units 07/22/2023    5:47 AM 07/21/2023    1:31 PM 07/21/2023    9:43 AM  BMP  Glucose 70 - 99 mg/dL 914     BUN 8 - 23 mg/dL 73     Creatinine 7.82 - 1.24 mg/dL 9.56     Sodium 213 - 086 mmol/L 134     Potassium 3.5 - 5.1 mmol/L 4.4  4.4  4.7   Chloride 98 - 111 mmol/L 110     CO2 22 - 32 mmol/L 17     Calcium 8.9 - 10.3 mg/dL 7.9       Vitals:   57/84/69 2039 07/22/23 0157 07/22/23 0811 07/22/23 1516  BP: (!) 163/67 118/84 114/63 107/69  Pulse: (!) 58 98 (!) 109 97  Resp: 18 16 16 16   Temp: (!) 97.4 F (36.3 C) 97.7 F (36.5 C) 97.8 F (36.6 C) 97.8 F (36.6 C)  TempSrc:   Oral   SpO2: 99% 99% 100% 98%  Weight:      Height:       Author: Loyce Dys, MD 07/22/2023 3:28 PM  For on call review [email or web address].

## 2023-07-23 ENCOUNTER — Inpatient Hospital Stay: Payer: Medicare PPO

## 2023-07-23 LAB — BASIC METABOLIC PANEL
Anion gap: 5 (ref 5–15)
BUN: 68 mg/dL — ABNORMAL HIGH (ref 8–23)
CO2: 19 mmol/L — ABNORMAL LOW (ref 22–32)
Calcium: 7.7 mg/dL — ABNORMAL LOW (ref 8.9–10.3)
Chloride: 110 mmol/L (ref 98–111)
Creatinine, Ser: 2.09 mg/dL — ABNORMAL HIGH (ref 0.61–1.24)
GFR, Estimated: 31 mL/min — ABNORMAL LOW (ref >60)
Glucose, Bld: 102 mg/dL — ABNORMAL HIGH (ref 70–99)
Potassium: 4.5 mmol/L (ref 3.5–5.1)
Sodium: 134 mmol/L — ABNORMAL LOW (ref 135–145)

## 2023-07-23 LAB — CULTURE, BLOOD (ROUTINE X 2)
Culture: NO GROWTH
Culture: NO GROWTH
Special Requests: ADEQUATE

## 2023-07-23 LAB — GLUCOSE, CAPILLARY
Glucose-Capillary: 88 mg/dL (ref 70–99)
Glucose-Capillary: 186 mg/dL — ABNORMAL HIGH (ref 70–99)
Glucose-Capillary: 67 mg/dL — ABNORMAL LOW (ref 70–99)
Glucose-Capillary: 85 mg/dL (ref 70–99)
Glucose-Capillary: 150 mg/dL — ABNORMAL HIGH (ref 70–99)

## 2023-07-23 NOTE — Progress Notes (Signed)
Progress Note   Patient: Peter Robertson YQM:[id number] DOB: 1940-10-29 DOA: 07/18/2023     5 DOS: the patient was seen and examined on 07/23/2023      Brief Robertson course: Peter Robertson is a 83 y.o. male with medical history significant of hypertension, hyperlipidemia, diabetes mellitus, asthma, GERD, CAD, myocardial infarction, CKD-3A, BPH, BPV, vertigo, thrombocytopenia, dementia, dCHF, kidney stone, A-fib not on anticoagulants, who presents with weakness.   Assessment and Plan:  Severe sepsis with septic shock due to UTI: Patient presented with severe sepsis with septic shock, with hypothermia temperature 95.7, heart rate 95, RR 24.  Patient has been persistently hypotensive.  Lactic acid is up to 2.5.  Has worsening renal function.  CT scan negative for obstructive stone. CT scan showed moderate bilateral hydroureteronephrosis but no stone seen Received IV fluid Continue current antibiotics Continue midodrine 10 mg 3 times daily -Patient received one-time dose of Solu-Cortef 100 mg Follow-up on culture results Patient currently being followed by outpatient hospice     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) and Myocardial injury: Troponin is minimally elevated 54 --> 45.  No chest pain Continue Lipitor   Hypertension -Hold all blood pressure medications due to hypotension   Hyponatremia likely secondary to hypovolemia Patient received IV fluid Currently on salt tablets   Hyperkalemia:-Improving Lokelma discontinue   Chronic diastolic CHF (congestive heart failure) (HCC): 2D echo on 07/21/2022 showed EF > 55% with grade 2 diastolic dysfunction.  Patient is clinically dry.  BNP 56.5 Continue to hold Lasix   Acute renal failure superimposed on stage 3a chronic kidney disease (HCC): Baseline creatinine 2.18 on 07/03/2023.  His creatinine is at 4.15, BUN 152, GFR 14.  Likely multifactorial etiology, including dehydration and continuation of diuretics and Divan, UTI,  possible ATN due to hypotension.  CT scan showed bilateral hydroureteronephrosis, but no obstructive stone. -Avoid renal toxic medications Monitor input and output Continue to hold Lasix, Diovan   Paroxysmal atrial fibrillation (HCC): Heart rate 90s --> 70s -Hold metoprolol due to hypotension   Type II diabetes mellitus with renal manifestations Peter Robertson): Patient is taking Jardiance, Humalog, glargine insulin 32 units daily.  Recent A1c 6.8, well-controlled.  Blood sugar elevated 379, but anion gap normal 12.  No DKA. Continue sliding scale Diabetic coordinator on board recommending consideration of discontinuing Jardiance at discharge Continue long-acting insulin   Dementia without behavioral disturbance (HCC) Continue fall precaution   Hyperlipidemia Continue Lipitor   Thrombocytopenia (HCC): This is chronic issue.  Platelets 76.  No active bleeding Monitor CBC   BPH (benign prostatic hyperplasia) Continue to hold Flomax due to hypotension   Depression Continue home medications     Subjective:  Patient complaining of left ankle pain X-ray of the ankle did not show any fracture however showed minimal posterior heel spurs Denies nausea vomiting abdominal pain or chest pain    Physical Exam:   General: Elderly male laying in bed in no obvious distress Heme: No neck lymph node enlargement. Cardiac: S1/S2, RRR, No gallops or rubs. Respiratory: No rales, wheezing, rhonchi or rubs. GI: Soft, nondistended, nontender, no rebound pain, no organomegaly, BS present. GU: No hematuria Ext: No pitting leg edema bilaterally. 1+DP/PT pulse bilaterally. Musculoskeletal: No joint deformities, No joint redness or warmth, no limitation of ROM in spin. Skin: No rashes.  Neuro: Alert, following command, cranial nerves II-XII grossly intact Psych: Patient is not psychotic, no suicidal or hemocidal ideation.       Data Reviewed:  Vitals:  07/23/23 0448 07/23/23 0810 07/23/23 0845  07/23/23 1450  BP:  116/81    Pulse:  (!) 120    Resp:  16    Temp:  98 F (36.7 C)    TempSrc:  Oral    SpO2:  (!) 87% 96% 100%  Weight: 70 kg     Height:          Latest Ref Rng & Units 07/22/2023    5:47 AM 07/21/2023    1:21 AM 07/20/2023    4:04 AM  CBC  WBC 4.0 - 10.5 K/uL 7.1  6.0  6.2   Hemoglobin 13.0 - 17.0 g/dL 8.6  8.7  9.4   Hematocrit 39.0 - 52.0 % 24.7  25.8  26.8   Platelets 150 - 400 K/uL 88  68  77        Latest Ref Rng & Units 07/23/2023    4:00 AM 07/22/2023    5:47 AM 07/21/2023    1:31 PM  BMP  Glucose 70 - 99 mg/dL 161  096    BUN 8 - 23 mg/dL 68  73    Creatinine 0.45 - 1.24 mg/dL 4.09  8.11    Sodium 914 - 145 mmol/L 134  134    Potassium 3.5 - 5.1 mmol/L 4.5  4.4  4.4   Chloride 98 - 111 mmol/L 110  110    CO2 22 - 32 mmol/L 19  17    Calcium 8.9 - 10.3 mg/dL 7.7  7.9       Author: Loyce Dys, MD 07/23/2023 5:53 PM  For on call review [email or web address].

## 2023-07-23 NOTE — Plan of Care (Signed)
Patient alert and awake. Complaints of general malaise. All needs attended to. Repositioned frequently. Slept well. Will continue to monitor.   Problem: Coping: Goal: Ability to adjust to condition or change in health will improve Outcome: Not Progressing   Problem: Nutritional: Goal: Maintenance of adequate nutrition will improve Outcome: Not Progressing   Problem: Skin Integrity: Goal: Risk for impaired skin integrity will decrease Outcome: Not Progressing   Problem: Tissue Perfusion: Goal: Adequacy of tissue perfusion will improve Outcome: Not Progressing   Problem: Activity: Goal: Risk for activity intolerance will decrease Outcome: Not Progressing   Problem: Pain Managment: Goal: General experience of comfort will improve and/or be controlled Outcome: Not Progressing   Problem: Safety: Goal: Ability to remain free from injury will improve Outcome: Not Progressing

## 2023-07-23 NOTE — Progress Notes (Signed)
[facility]ized [facility] Patient Visit   Mr. Baby Stairs is a current [facility] patient with a terminal diagnosis of Cerebrovascular Disease. He resides at [facility] and over the last 24 hours had developed high blood glucose levels and worsening mentation. Facility provider made decision to send him to the ED for further evaluation. He was admitted 1.29.25 with diagnosis of septic shock related to UTI. Per [name] with [facility] this is a related admission.   Visited with Mr. Lefevers at the bedside.  He is sitting up in the bed watching TV. He continues to express frustration with his family not visiting.  I told him I spoke with [name] last night for awhile and reminded him that she is sick and can't visit the hospital.  She informed me that she told TOC that she is not happy with care Mr. Holtsclaw is receiving at [facility] and would like another facility.  I did discuss with her that Medicaid LTC beds were hard to find and that if he was medically stable to discharge home in the next couple days, his hospice SW could help seek placement in another facility if one was not found.    Speaking with Charlynn Court, TOC, today- patient had 2 bed offers:    LTC at [facility]- but would have to have [facility] Per Irving Burton declined both bed offers and will return to [facility] with continued [employer].  Patient is post PT and OT evaluations today. Per Dr. Meriam Sprague, Mr. Stankowski complained of left ankle pain- so he wants to work him up for that prior to discharge.  Left ankle x-ray completed.   Patient is inpatient appropriate with need of IV morphine for pain. Patient requires ongoing skilled care to monitor effectiveness of interventions for symptoms.   Vital Signs:  T- 98 oral, BP 116/81,  P 91, R 16,  spO2 87% room air   I&O: no new reported   Abnormal labs: Na 134, CO2 19, glucose 102, BUN 68,  Creat 2.09, Ca 7.7, GFR 31   Diagnostics:   CLINICAL DATA:  Left ankle pain.   EXAM:  LEFT ANKLE COMPLETE - 3+ VIEW   COMPARISON:  Left foot radiographs 06/30/2022, left ankle radiographs 08/30/2020   FINDINGS: There is diffuse decreased bone mineralization. The ankle mortise is symmetric and intact. Unchanged minimal medial and lateral malleolar and distal anterior tibial plafond degenerative spurs. Mild plantar and minimal posterior calcaneal heel spurs. No acute fracture or dislocation. Moderate atherosclerotic calcifications.   IMPRESSION: 1. No significant osteoarthritis. 2. Mild plantar and minimal posterior calcaneal heel spurs.   Electronically Signed   By: Neita Garnet M.D.   On: 07/23/2023 14:53   IV/PRN Meds:  Morphine 2mg  x 2 today.    Problem List:  1. Severe sepsis with septic shock related to UTI 2. CAD s/p two vessell CABG 2011, CAD & Myocardial injury 3. Hypertension 4. Hyponatremia likely secondary to hypovolemia 5. Hyperkalemia- resolved 6. Acute Renal Failure superimposed on stage 3a chronic kidney disease 7. Paroxysmal Atrial Fibrillation 8. Type II diabetes with renal manifestations 9. Dementia without behavorial disturbance 10. Hyperlipidemia 11. Thrombocytopenia- chronic condition 12. Benign Prostatic Hyperplasia 13. Depression     Discharge Planning: Ongoing, currently CG desires treatment for current infection.  Per MD, will likely discharge back to [facility] tomorrow.   Family Contact: [name] has been sick with Norovirus.   IDT: Updated   Goals of  Care: DNR  Norris Cross, RN Nurse Liaison [phone]

## 2023-07-23 NOTE — Care Management Important Message (Signed)
Important Message  Patient Details  Name: Peter Robertson MRN: [id number] Date of Birth: 16-Nov-1940   Important Message Given:  Yes - Medicare IM     Sherilyn Banker 07/23/2023, 2:00 PM

## 2023-07-23 NOTE — Evaluation (Signed)
Physical Therapy Evaluation Patient Details Name: Su Duma KRAY MRN: [id number] DOB: 1941/01/01 Today's Date: 07/23/2023  History of Present Illness  83 y.o. male with past medical history of  hypertension, hyperlipidemia, diabetes mellitus, CAD, dCHF, sarcoidosis, dementia, depression, BPH, CKD-3A, A-fib not on anticoagulants, thrombocytopenia, left kidney hydronephrosis (s/p of nephrostomy tube) presented to ED on 06/27/2023 after fall out of his wheelchair at his facility. Admitted with UTI.   Clinical Impression  Pt alert, oriented to self, did know he was at the hospital. Pt stated he lived at home with a caregiver but per chart he is from LTC at [facility], unable to provide any further information. He required modA for bed mobility, and intermittent assistance to maintain sitting at EOB with minA. Pt complained of L shin/calf/foot pain throughout, TTP and with DF. Sit <> Stand with maxAx2, two person handheld assist (no RW available). To stand pivot to recliner, totalAx2.  Overall the patient demonstrated deficits (see "PT Problem List") that impede the patient's functional abilities, safety, and mobility and would benefit from skilled PT intervention.         If plan is discharge home, recommend the following: Help with stairs or ramp for entrance;Assist for transportation;Direct supervision/assist for medications management;Direct supervision/assist for financial management;Two people to help with walking and/or transfers;Two people to help with bathing/dressing/bathroom   Can travel by private vehicle   No    Equipment Recommendations None recommended by PT  Recommendations for Other Services       Functional Status Assessment Patient has had a recent decline in their functional status and demonstrates the ability to make significant improvements in function in a reasonable and predictable amount of time.     Precautions / Restrictions Precautions Precautions:  Fall Restrictions Weight Bearing Restrictions Per Provider Order: No      Mobility  Bed Mobility Overal bed mobility: Needs Assistance Bed Mobility: Supine to Sit     Supine to sit: Mod assist          Transfers Overall transfer level: Needs assistance Equipment used: 2 person hand held assist Transfers: Sit to/from Stand, Bed to chair/wheelchair/BSC Sit to Stand: Max assist Stand pivot transfers: Total assist         General transfer comment: pt limited due to LLE pain, maintained NWB and posterior lean throughout transfer    Ambulation/Gait                  Stairs            Wheelchair Mobility     Tilt Bed    Modified Rankin (Stroke Patients Only)       Balance Overall balance assessment: Needs assistance Sitting-balance support: Feet supported Sitting balance-Leahy Scale: Poor Sitting balance - Comments: intermittent minA  to maintain seated balance Postural control: Posterior lean   Standing balance-Leahy Scale: Zero                               Pertinent Vitals/Pain Pain Assessment Pain Assessment: Faces Faces Pain Scale: Hurts even more Pain Location: L calf/shin/foot Pain Descriptors / Indicators: Aching, Moaning, Grimacing Pain Intervention(s): Limited activity within patient's tolerance, Monitored during session, Repositioned    Home Living Family/patient expects to be discharged to:: Skilled nursing facility Living Arrangements: Alone                 Additional Comments: Pt is poor historian, pt reporting he lives at home  with a caregiver but chart notes a facility. Has an ankle monitor on.    Prior Function Prior Level of Function : History of Falls (last six months);Needs assist;Patient poor historian/Family not available             Mobility Comments: Pt reports using RW in the home and using w/c out of the home - no caregiver present to verify ADLs Comments: Pt reports he is indep with ADL  and caregiver assists with IADL     Extremity/Trunk Assessment   Upper Extremity Assessment Upper Extremity Assessment: Generalized weakness    Lower Extremity Assessment Lower Extremity Assessment: Generalized weakness (TTP of L ankle/shin/calf/foot)       Communication      Cognition Arousal: Alert Behavior During Therapy: WFL for tasks assessed/performed Overall Cognitive Status: No family/caregiver present to determine baseline cognitive functioning                                 General Comments: pt oriented to self and hospital, reported living at home with a caregiver but per chart lives at a facility        General Comments      Exercises     Assessment/Plan    PT Assessment Patient needs continued PT services  PT Problem List Decreased strength;Decreased balance;Decreased mobility;Decreased activity tolerance;Decreased cognition;Decreased safety awareness       PT Treatment Interventions DME instruction;Gait training;Functional mobility training;Therapeutic activities;Therapeutic exercise;Balance training;Cognitive remediation;Patient/family education    PT Goals (Current goals can be found in the Care Plan section)  Acute Rehab PT Goals PT Goal Formulation: Patient unable to participate in goal setting Time For Goal Achievement: 08/06/23 Potential to Achieve Goals: Fair    Frequency Min 1X/week     Co-evaluation PT/OT/SLP Co-Evaluation/Treatment: Yes Reason for Co-Treatment: To address functional/ADL transfers;For patient/therapist safety;Necessary to address cognition/behavior during functional activity PT goals addressed during session: Mobility/safety with mobility;Balance;Proper use of DME OT goals addressed during session: ADL's and self-care;Proper use of Adaptive equipment and DME       AM-PAC PT "6 Clicks" Mobility  Outcome Measure Help needed turning from your back to your side while in a flat bed without using bedrails?: A  Lot Help needed moving from lying on your back to sitting on the side of a flat bed without using bedrails?: A Lot Help needed moving to and from a bed to a chair (including a wheelchair)?: A Lot Help needed standing up from a chair using your arms (e.g., wheelchair or bedside chair)?: A Lot Help needed to walk in hospital room?: Total Help needed climbing 3-5 steps with a railing? : Total 6 Click Score: 10    End of Session Equipment Utilized During Treatment: Gait belt Activity Tolerance: Patient limited by pain Patient left: in chair;with call bell/phone within reach;with chair alarm set Nurse Communication: Mobility status PT Visit Diagnosis: Unsteadiness on feet (R26.81);Other abnormalities of gait and mobility (R26.89);Muscle weakness (generalized) (M62.81);History of falling (Z91.81);Difficulty in walking, not elsewhere classified (R26.2)    Time: 9147-8295 PT Time Calculation (min) (ACUTE ONLY): 16 min   Charges:   PT Evaluation $PT Eval Low Complexity: 1 Low   PT General Charges $$ ACUTE PT VISIT: 1 Visit        Olga Coaster PT, DPT 12:44 PM,07/23/23

## 2023-07-23 NOTE — Evaluation (Signed)
Occupational Therapy Evaluation Patient Details Name: Peter Robertson MRN: [id number] DOB: 01/08/1941 Today's Date: 07/23/2023   History of Present Illness 83 y.o. male with past medical history of  hypertension, hyperlipidemia, diabetes mellitus, CAD, dCHF, sarcoidosis, dementia, depression, BPH, CKD-3A, A-fib not on anticoagulants, thrombocytopenia, left kidney hydronephrosis (s/p of nephrostomy tube) presented to ED on 06/27/2023 after fall out of his wheelchair at his facility. Admitted with UTI.   Clinical Impression   Mr Mcwhirter was seen for OT evaluation this date. Prior to hospital admission, pt was from LTC with hospice per chart.  Pt is poor historian. Pt currently requires MOD A exit bed, MAX A don B socks in sitting - pt reports pain in L ankle to touch. TOTAL A x2 bed>chair t/f, limited by L ankle pain as pt elects to maintain NWBing. Pt would benefit from skilled OT to address noted impairments and functional limitations (see below for any additional details). Upon hospital discharge, recommend OT follow up.    If plan is discharge home, recommend the following: Two people to help with walking and/or transfers;A lot of help with bathing/dressing/bathroom;Assistance with cooking/housework;Assist for transportation;Help with stairs or ramp for entrance;Direct supervision/assist for medications management;Direct supervision/assist for financial management;Supervision due to cognitive status    Functional Status Assessment  Patient has had a recent decline in their functional status and demonstrates the ability to make significant improvements in function in a reasonable and predictable amount of time.  Equipment Recommendations  Other (comment) (defer)    Recommendations for Other Services       Precautions / Restrictions Precautions Precautions: Fall Restrictions Weight Bearing Restrictions Per Provider Order: No      Mobility Bed Mobility Overal bed mobility: Needs  Assistance Bed Mobility: Supine to Sit     Supine to sit: Mod assist          Transfers Overall transfer level: Needs assistance Equipment used: 2 person hand held assist Transfers: Sit to/from Stand, Bed to chair/wheelchair/BSC Sit to Stand: Max assist, +2 physical assistance Stand pivot transfers: Total assist, +2 physical assistance         General transfer comment: pt limited due to LLE pain, maintained NWB and posterior lean throughout transfer      Balance Overall balance assessment: Needs assistance Sitting-balance support: Feet supported Sitting balance-Leahy Scale: Poor   Postural control: Posterior lean Standing balance support: Bilateral upper extremity supported Standing balance-Leahy Scale: Zero                             ADL either performed or assessed with clinical judgement   ADL Overall ADL's : Needs assistance/impaired                                       General ADL Comments: MAX A don B socks in sitting, pt reports pain in L ankel to touch. TOTAL A x2 bed>chair t/f, limited by L ankle pain      Pertinent Vitals/Pain Pain Assessment Pain Assessment: Faces Faces Pain Scale: Hurts even more Pain Location: L calf/shin/foot Pain Descriptors / Indicators: Aching, Moaning, Grimacing Pain Intervention(s): Limited activity within patient's tolerance, Patient requesting pain meds-RN notified     Extremity/Trunk Assessment Upper Extremity Assessment Upper Extremity Assessment: Generalized weakness   Lower Extremity Assessment Lower Extremity Assessment: Generalized weakness       Communication Communication Communication:  No apparent difficulties   Cognition Arousal: Alert Behavior During Therapy: WFL for tasks assessed/performed Overall Cognitive Status: No family/caregiver present to determine baseline cognitive functioning                                 General Comments: pt oriented to self  and hospital, reported living at home with a caregiver but per chart lives at a facility                Home Living Family/patient expects to be discharged to:: Skilled nursing facility Living Arrangements: Alone                               Additional Comments: Pt is poor historian, pt reporting he lives at home with a caregiver but chart notes LTC resident with hospice. Has an ankle monitor on.      Prior Functioning/Environment Prior Level of Function : History of Falls (last six months);Needs assist;Patient poor historian/Family not available             Mobility Comments: Pt reports using RW in the home and using w/c out of the home - no caregiver present to verify ADLs Comments: Pt reports he is indep with ADL and caregiver assists with IADL        OT Problem List: Decreased strength;Decreased cognition;Decreased activity tolerance;Decreased safety awareness;Impaired balance (sitting and/or standing);Decreased knowledge of use of DME or AE      OT Treatment/Interventions: Self-care/ADL training;Therapeutic exercise;Therapeutic activities;Cognitive remediation/compensation;DME and/or AE instruction;Patient/family education;Balance training    OT Goals(Current goals can be found in the care plan section) Acute Rehab OT Goals Patient Stated Goal: go home OT Goal Formulation: With patient Time For Goal Achievement: 08/06/23 Potential to Achieve Goals: Fair ADL Goals Pt Will Perform Grooming: with modified independence;sitting Pt Will Perform Lower Body Dressing: with min assist;sitting/lateral leans Pt Will Transfer to Toilet: with min assist;ambulating;bedside commode  OT Frequency: Min 1X/week    Co-evaluation PT/OT/SLP Co-Evaluation/Treatment: Yes Reason for Co-Treatment: To address functional/ADL transfers;For patient/therapist safety;Necessary to address cognition/behavior during functional activity PT goals addressed during session:  Mobility/safety with mobility;Balance;Proper use of DME OT goals addressed during session: ADL's and self-care;Proper use of Adaptive equipment and DME      AM-PAC OT "6 Clicks" Daily Activity     Outcome Measure Help from another person eating meals?: None Help from another person taking care of personal grooming?: A Little Help from another person toileting, which includes using toliet, bedpan, or urinal?: A Lot Help from another person bathing (including washing, rinsing, drying)?: A Lot Help from another person to put on and taking off regular upper body clothing?: A Little Help from another person to put on and taking off regular lower body clothing?: A Lot 6 Click Score: 16   End of Session Equipment Utilized During Treatment: Gait belt Nurse Communication: Patient requests pain meds;Mobility status  Activity Tolerance: Patient limited by pain Patient left: in chair;with call bell/phone within reach;with chair alarm set  OT Visit Diagnosis: Other abnormalities of gait and mobility (R26.89);Muscle weakness (generalized) (M62.81);History of falling (Z91.81)                Time: 4098-1191 OT Time Calculation (min): 16 min Charges:  OT General Charges $OT Visit: 1 Visit OT Evaluation $OT Eval Moderate Complexity: 1 Mod  Kathie Dike, M.S. OTR/L  07/23/23, 1:08 PM  ascom [phone]

## 2023-07-23 NOTE — Progress Notes (Signed)
Mobility Specialist - Progress Note   07/23/23 1500  Mobility  Activity Transferred from chair to bed  Level of Assistance Dependent, patient does less than 25%  Activity Response Tolerated well  Mobility visit 1 Mobility     Pt sitting in recliner upon arrival, utilizing RA. Pt unable to stand from recliner despite heavy +2 assist, foot blocking, and manual facilitation of foot placement d/t cognitive deficits. Pt transferred chair-bed via squat-pivot. Pt left in bed with alarm set, needs in reach.    Filiberto Pinks Mobility Specialist 07/23/23, 3:39 PM

## 2023-07-23 NOTE — TOC Progression Note (Signed)
Transition of Care [facility]) - Progression Note    Patient Details  Name: Peter Robertson MRN: [id number] Date of Birth: 06/13/1941  [facility] of [facility]) CM/SW Contact  Chapman Fitch, RN Phone Number: 07/23/2023, 3:56 PM  Clinical Narrative:     Kenney Houseman at [facility] confirms they can accept patient LTC with [facility]  Theodoro Grist at [facility] confirms they can accept patient for LTC, but patient would have to have services under [facility].    Notified neighbor [name].  She declines both options, and patient will return to [facility] with [employer] [facility] [facility]     Expected Discharge Plan: Skilled Nursing Facility (with hospice) Barriers to Discharge: Continued Medical Work up  Expected Discharge Plan and Services     Post Acute Care Choice: Skilled Nursing Facility Living arrangements for the past 2 months: Skilled Nursing Facility                                       Social Determinants of Health (SDOH) Interventions SDOH Screenings   Food Insecurity: No Food Insecurity (07/19/2023)  Housing: Low Risk  (07/19/2023)  Transportation Needs: No Transportation Needs (07/19/2023)  Utilities: Not At Risk (07/19/2023)  Financial Resource Strain: Low Risk  (07/12/2022)   Received from [facility], [facility] Care  Physical Activity: Insufficiently Active (08/26/2020)   Received from [facility], [facility] Care  Social Connections: Moderately Isolated (07/19/2023)  Stress: Stress Concern Present (08/26/2020)   Received from [facility], [facility] Care  Tobacco Use: Medium Risk (07/19/2023)  Health Literacy: High Risk (08/11/2022)   Received from [facility], [facility] Care    Readmission Risk Interventions     No data to display

## 2023-07-23 NOTE — Plan of Care (Signed)

## 2023-07-24 DIAGNOSIS — A419 Sepsis, unspecified organism: Secondary | ICD-10-CM | POA: Diagnosis not present

## 2023-07-24 DIAGNOSIS — R6521 Severe sepsis with septic shock: Secondary | ICD-10-CM | POA: Diagnosis not present

## 2023-07-24 LAB — BASIC METABOLIC PANEL
Anion gap: 7 (ref 5–15)
BUN: 61 mg/dL — ABNORMAL HIGH (ref 8–23)
CO2: 18 mmol/L — ABNORMAL LOW (ref 22–32)
Calcium: 8 mg/dL — ABNORMAL LOW (ref 8.9–10.3)
Chloride: 111 mmol/L (ref 98–111)
Creatinine, Ser: 2.18 mg/dL — ABNORMAL HIGH (ref 0.61–1.24)
GFR, Estimated: 29 mL/min — ABNORMAL LOW (ref >60)
Glucose, Bld: 121 mg/dL — ABNORMAL HIGH (ref 70–99)
Potassium: 5.4 mmol/L — ABNORMAL HIGH (ref 3.5–5.1)
Sodium: 136 mmol/L (ref 135–145)

## 2023-07-24 LAB — GLUCOSE, CAPILLARY
Glucose-Capillary: 72 mg/dL (ref 70–99)
Glucose-Capillary: 66 mg/dL — ABNORMAL LOW (ref 70–99)
Glucose-Capillary: 99 mg/dL (ref 70–99)
Glucose-Capillary: 177 mg/dL — ABNORMAL HIGH (ref 70–99)

## 2023-07-24 MED ORDER — MIDODRINE HCL 10 MG PO TABS: 10.0000 mg | ORAL_TABLET | Freq: Three times a day (TID) | ORAL | Status: DC

## 2023-07-24 MED ORDER — SODIUM ZIRCONIUM CYCLOSILICATE 10 G PO PACK
10.0000 g | PACK | Freq: Two times a day (BID) | ORAL | Status: DC
  Administered 2023-07-24: 10 g via ORAL
  Filled 2023-07-24 (×2): qty 1

## 2023-07-24 MED ORDER — INSULIN GLARGINE-YFGN 100 UNIT/ML ~~LOC~~ SOLN
29.0000 [IU] | Freq: Every day | SUBCUTANEOUS | Status: DC
Start: 2023-07-24 — End: 2023-07-24
  Filled 2023-07-24: qty 0.29

## 2023-07-24 MED ORDER — PHENAZOPYRIDINE HCL 100 MG PO TABS
100.0000 mg | ORAL_TABLET | Freq: Three times a day (TID) | ORAL | Status: DC
  Administered 2023-07-24: 100 mg via ORAL
  Filled 2023-07-24: qty 1

## 2023-07-24 MED ORDER — METOPROLOL TARTRATE 5 MG/5ML IV SOLN
5.0000 mg | Freq: Four times a day (QID) | INTRAVENOUS | Status: DC | PRN
  Administered 2023-07-24: 5 mg via INTRAVENOUS
  Filled 2023-07-24: qty 5

## 2023-07-24 NOTE — Plan of Care (Signed)
  Problem: Education: Goal: Ability to describe self-care measures that may prevent or decrease complications (Diabetes Survival Skills Education) will improve Outcome: Adequate for Discharge Goal: Individualized Educational Video(s) Outcome: Adequate for Discharge   Problem: Coping: Goal: Ability to adjust to condition or change in health will improve Outcome: Adequate for Discharge   Problem: Fluid Volume: Goal: Ability to maintain a balanced intake and output will improve Outcome: Adequate for Discharge   Problem: Health Behavior/Discharge Planning: Goal: Ability to identify and utilize available resources and services will improve Outcome: Adequate for Discharge Goal: Ability to manage health-related needs will improve Outcome: Adequate for Discharge   Problem: Metabolic: Goal: Ability to maintain appropriate glucose levels will improve Outcome: Adequate for Discharge   Problem: Nutritional: Goal: Maintenance of adequate nutrition will improve Outcome: Adequate for Discharge Goal: Progress toward achieving an optimal weight will improve Outcome: Adequate for Discharge   Problem: Skin Integrity: Goal: Risk for impaired skin integrity will decrease Outcome: Adequate for Discharge   Problem: Tissue Perfusion: Goal: Adequacy of tissue perfusion will improve Outcome: Adequate for Discharge   Problem: Education: Goal: Knowledge of General Education information will improve Description: Including pain rating scale, medication(s)/side effects and non-pharmacologic comfort measures Outcome: Adequate for Discharge   Problem: Health Behavior/Discharge Planning: Goal: Ability to manage health-related needs will improve Outcome: Adequate for Discharge   Problem: Clinical Measurements: Goal: Ability to maintain clinical measurements within normal limits will improve Outcome: Adequate for Discharge Goal: Will remain free from infection Outcome: Adequate for Discharge Goal:  Diagnostic test results will improve Outcome: Adequate for Discharge Goal: Respiratory complications will improve Outcome: Adequate for Discharge Goal: Cardiovascular complication will be avoided Outcome: Adequate for Discharge   Problem: Activity: Goal: Risk for activity intolerance will decrease Outcome: Adequate for Discharge   Problem: Nutrition: Goal: Adequate nutrition will be maintained Outcome: Adequate for Discharge   Problem: Coping: Goal: Level of anxiety will decrease Outcome: Adequate for Discharge   Problem: Elimination: Goal: Will not experience complications related to bowel motility Outcome: Adequate for Discharge Goal: Will not experience complications related to urinary retention Outcome: Adequate for Discharge   Problem: Pain Managment: Goal: General experience of comfort will improve and/or be controlled Outcome: Adequate for Discharge   Problem: Safety: Goal: Ability to remain free from injury will improve Outcome: Adequate for Discharge   Problem: Skin Integrity: Goal: Risk for impaired skin integrity will decrease Outcome: Adequate for Discharge   Pt being taken to [facility] by ems transport team. EMS team has all belongings and DC paperwork. Pt report called to [facility] [employer]

## 2023-07-24 NOTE — TOC Transition Note (Signed)
 Transition of Care [facility]) - Discharge Note   Patient Details  Name: Peter Robertson MRN: [id number] Date of Birth: [birth year]  Transition of Care [facility]) CM/SW Contact:  Lauraine JAYSON Carpen, LCSW Phone Number: 07/24/2023, 3:10 PM   Clinical Narrative: Patient has orders to discharge back to [facility] today. [facility] liaison is aware. RN will call report to [phone] (Room 124B). EMS transport has been arranged. No further concerns. CSW signing off.    Final next level of care: Skilled Nursing Facility Barriers to Discharge: Barriers Resolved   Patient Goals and CMS Choice            Discharge Placement   Existing PASRR number confirmed : 07/20/23          Patient chooses bed at: [facility] Patient to be transferred to facility by: EMS Name of family member notified: [name] Ford Patient and family notified of of transfer: 07/24/23  Discharge Plan and Services Additional resources added to the After Visit Summary for       Post Acute Care Choice: Skilled Nursing Facility                               Social Drivers of Health (SDOH) Interventions SDOH Screenings   Food Insecurity: No Food Insecurity (07/19/2023)  Housing: Low Risk  (07/19/2023)  Transportation Needs: No Transportation Needs (07/19/2023)  Utilities: Not At Risk (07/19/2023)  Financial Resource Strain: Low Risk  (07/12/2022)   Received from [facility], [facility] Care  Physical Activity: Insufficiently Active (08/26/2020)   Received from [facility], [facility]  Social Connections: Moderately Isolated (07/19/2023)  Stress: Stress Concern Present (08/26/2020)   Received from [facility], [facility] Care  Tobacco Use: Medium Risk (07/19/2023)  Health Literacy: High Risk (08/11/2022)   Received from [facility], [facility] Care     Readmission Risk Interventions     No data to display

## 2023-07-24 NOTE — Inpatient Diabetes Management (Signed)
Inpatient Diabetes Program Recommendations  AACE/ADA: New Consensus Statement on Inpatient Glycemic Control  Target Ranges:  Prepandial:   less than 140 mg/dL      Peak postprandial:   less than 180 mg/dL (1-2 hours)      Critically ill patients:  140 - 180 mg/dL    Latest Reference Range & Units 07/23/23 10:05 07/23/23 11:44 07/23/23 16:48 07/23/23 17:49 07/23/23 21:25  Glucose-Capillary 70 - 99 mg/dL 88 696 (H) 67 (L) 85 295 (H)   Review of Glycemic Control  Diabetes history: DM2 Outpatient Diabetes medications: Lantus 32 units QHS, Humalog 2-12 units AC&HS and 2am, Jardiance 10 mg daily Current orders for Inpatient glycemic control: Semglee 33 units QHS, Novolog 0-9 units TID with meals, Novolog 0-5 units at bedtime   Inpatient Diabetes Program Recommendations:     Insulin: CBGs ranged from 67-186 mg/dl on 2/3.  Please consider decreasing Semglee to 29 units at bedtime.  Outpatient DM meds: Patient admitted with UTI (also inpt 06/27/23-07/02/23 with UTI).  Due to risk of UTI with Jardiance, please consider stopping Jardiance at discharge.   Thanks, Orlando Penner, RN, MSN, CDCES Diabetes Coordinator Inpatient Diabetes Program [phone] (Team Pager from 8am to 5pm)

## 2023-07-24 NOTE — Discharge Summary (Signed)
 Physician Discharge Summary   Patient: Peter Robertson MRN: [id number] DOB: [birth year]  Admit date:     07/18/2023  Discharge date: 07/24/23  Discharge Physician: Drue ONEIDA Potter   PCP: System, Provider Not In   Recommendations at discharge:  Follow-up with hospice  Discharge Diagnoses: Severe sepsis with septic shock due to UTI:  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) and Myocardial injury Hypertension Hyponatremia likely secondary to hypovolemia Hyperkalemia: Chronic diastolic CHF (congestive heart failure) (HCC):  Acute renal failure superimposed on stage 3a chronic kidney disease (HCC): Paroxysmal atrial fibrillation (HCC): Heart rate 90s --> 70s Type II diabetes mellitus with renal manifestations (HCC) Dementia without behavioral disturbance (HCC) Hyperlipidemia Thrombocytopenia (HCC): BPH (benign prostatic hyperplasia) Depression    Hospital Course: Peter Robertson is a 83 y.o. male with medical history significant of hypertension, hyperlipidemia, diabetes mellitus, asthma, GERD, CAD, myocardial infarction, CKD-3A, BPH, BPV, vertigo, thrombocytopenia, dementia, dCHF, kidney stone, A-fib not on anticoagulants, who presents with weakness.  Patient found to have urinary tract infection and underwent antibiotic therapy.  Patient has completed antibiotics.  Also complained of left ankle pain however imaging did not show any acute pathology.  Patient has therefore been cleared for discharge today to follow-up with hospice.       Consultants: Hospice Procedures performed: None Disposition: Hospice care Diet recommendation:  Cardiac diet DISCHARGE MEDICATION: Allergies as of 07/24/2023       Reactions   Sulfamethoxazole-trimethoprim Other (See Comments)   dizziness dizziness dizziness   Tape Rash   blisters        Medication List     STOP taking these medications    furosemide  20 MG tablet Commonly known as: LASIX    isosorbide  mononitrate 60 MG 24  hr tablet Commonly known as: IMDUR    metoprolol  succinate 50 MG 24 hr tablet Commonly known as: TOPROL -XL   predniSONE  20 MG tablet Commonly known as: DELTASONE    valsartan 80 MG tablet Commonly known as: DIOVAN       TAKE these medications    acetaminophen  325 MG tablet Commonly known as: TYLENOL  Take 2 tablets (650 mg total) by mouth every 6 (six) hours as needed for mild pain or fever.   albuterol  (2.5 MG/3ML) 0.083% nebulizer solution Commonly known as: PROVENTIL  Take 2.5 mg by nebulization every 4 (four) hours as needed.   alum & mag hydroxide-simeth 200-200-20 MG/5ML suspension Commonly known as: MAALOX/MYLANTA Take 15 mLs by mouth every 6 (six) hours as needed for indigestion or heartburn.   atorvastatin  20 MG tablet Commonly known as: LIPITOR Take 20 mg by mouth at bedtime.   Boudreauxs Butt Paste 16 % Oint Generic drug: Zinc  Oxide Apply 1 Application topically 3 (three) times daily as needed.   cholecalciferol  25 MCG (1000 UNIT) tablet Commonly known as: VITAMIN D3 Take 1,000 Units by mouth daily.   docusate sodium  100 MG capsule Commonly known as: COLACE Take 1 capsule (100 mg total) by mouth 2 (two) times daily as needed for mild constipation.   empagliflozin 10 MG Tabs tablet Commonly known as: JARDIANCE Take 10 mg by mouth daily.   feeding supplement Liqd Take 237 mLs by mouth 3 (three) times daily between meals.   gabapentin  300 MG capsule Commonly known as: NEURONTIN  Take 1 capsule (300 mg total) by mouth 3 (three) times daily. What changed: when to take this   insulin  glargine 100 UNIT/ML injection Commonly known as: LANTUS  Inject 0.3 mLs (30 Units total) into the skin daily. What changed:  how much to take when to take this additional instructions   insulin  lispro 100 UNIT/ML KwikPen Commonly known as: HUMALOG Inject 2-12 Units into the skin 3 times daily with meals, bedtime and 2 AM. Sliding scale insulin  < 60 = Call MD 200-249 =  2 units 250-299 = 4 units 300-349 = 6 units 350-359 = 8 units 400-449 = 10 units 450-499 = 12 units >500 = Call MD   midodrine  10 MG tablet Commonly known as: PROAMATINE  Take 1 tablet (10 mg total) by mouth 3 (three) times daily with meals.   nitroGLYCERIN  0.4 MG SL tablet Commonly known as: NITROSTAT  Place 0.4 mg under the tongue every 5 (five) minutes as needed for chest pain.   ondansetron  4 MG tablet Commonly known as: ZOFRAN  Take 1 tablet (4 mg total) by mouth every 6 (six) hours as needed for nausea.   sertraline  50 MG tablet Commonly known as: ZOLOFT  Take 1 tablet by mouth daily.   tamsulosin  0.4 MG Caps capsule Commonly known as: FLOMAX  Take 0.8 mg by mouth at bedtime.        Discharge Exam: Filed Weights   07/21/23 0458 07/23/23 0448 07/24/23 0348  Weight: 70.7 kg 70 kg 70 kg   General: Elderly male laying in bed in no obvious distress Heme: No neck lymph node enlargement. Cardiac: S1/S2, RRR, No gallops or rubs. Respiratory: No rales, wheezing, rhonchi or rubs. GI: Soft, nondistended, nontender, no rebound pain, no organomegaly, BS present. GU: No hematuria Ext: No pitting leg edema bilaterally. 1+DP/PT pulse bilaterally. Musculoskeletal: No joint deformities, No joint redness or warmth, no limitation of ROM in spin. Skin: No rashes.  Neuro: Alert, following command, cranial nerves II-XII grossly intact Psych: Patient is not psychotic, no suicidal or hemocidal ideation.    Condition at discharge: good  The results of significant diagnostics from this hospitalization (including imaging, microbiology, ancillary and laboratory) are listed below for reference.   Imaging Studies: DG Ankle Complete Left Result Date: 07/23/2023 CLINICAL DATA:  Left ankle pain. EXAM: LEFT ANKLE COMPLETE - 3+ VIEW COMPARISON:  Left foot radiographs 06/30/2022, left ankle radiographs 08/30/2020 FINDINGS: There is diffuse decreased bone mineralization. The ankle mortise is  symmetric and intact. Unchanged minimal medial and lateral malleolar and distal anterior tibial plafond degenerative spurs. Mild plantar and minimal posterior calcaneal heel spurs. No acute fracture or dislocation. Moderate atherosclerotic calcifications. IMPRESSION: 1. No significant osteoarthritis. 2. Mild plantar and minimal posterior calcaneal heel spurs. Electronically Signed   By: Tanda Lyons M.D.   On: 07/23/2023 14:53   CT RENAL STONE STUDY Result Date: 07/18/2023 CLINICAL DATA:  Abdominal/flank pain, stone suspected EXAM: CT ABDOMEN AND PELVIS WITHOUT CONTRAST TECHNIQUE: Multidetector CT imaging of the abdomen and pelvis was performed following the standard protocol without IV contrast. RADIATION DOSE REDUCTION: This exam was performed according to the departmental dose-optimization program which includes automated exposure control, adjustment of the mA and/or kV according to patient size and/or use of iterative reconstruction technique. COMPARISON:  Three weeks ago 06/27/2023 FINDINGS: Lower chest: Chronic calcified pleural plaques. Small chronic right pleural effusion. Motion artifact at the lung bases. Hepatobiliary: No focal liver abnormality allowing for motion. Layering gallstones within the gallbladder. No pericholecystic inflammation. No biliary dilatation. Pancreas: Coarse calcifications throughout the pancreas. No pancreatic inflammation. Spleen: Again seen splenomegaly, 14.6 cm transverse. No acute findings. Adrenals/Urinary Tract: No adrenal nodule. Moderate bilateral hydroureteronephrosis. This is progressed on the left and new on the right from prior exam. Both ureters are dilated to  the bladder insertion. No renal or ureteral calculi. Irregular thick walled wall thickening of the bladder dome. No bladder stone. Stomach/Bowel: Motion artifact, streak artifact from overlying monitoring device and lack of contrast limits bowel assessment. There is no evidence of bowel obstruction or  inflammation. Cecum is located in the mid abdomen as before. The appendix is normal. Moderate colonic stool burden. Occasional colonic diverticula without diverticulitis. Vascular/Lymphatic: Aortic and branch atherosclerosis. No aortic aneurysm. No bulky adenopathy, adenopathy assessment is limited on the current exam. Reproductive: Again seen enlarged prostate. Other: Bilateral fat containing inguinal hernias. No free air or ascites. Musculoskeletal: Chronic L1 compression fracture. No acute osseous findings. IMPRESSION: 1. Moderate bilateral hydroureteronephrosis, progressed on the left and new on the right from prior exam. Both ureters are dilated to the bladder insertion. No renal or ureteral calculi. 2. Irregular thick walled wall thickening of the bladder dome, query chronic bladder outlet obstruction. Infection or neoplasm also considered. 3. Cholelithiasis without cholecystitis. 4. Chronic calcified pleural plaques. Small chronic right pleural effusion. 5. Sequela of chronic pancreatitis. 6. Chronic splenomegaly. Aortic Atherosclerosis (ICD10-I70.0). Electronically Signed   By: Andrea Gasman M.D.   On: 07/18/2023 21:56   DG Chest Port 1 View Result Date: 07/18/2023 CLINICAL DATA:  Hyperglycemia.  Cough. EXAM: PORTABLE CHEST 1 VIEW COMPARISON:  June 27, 2023. FINDINGS: Stable cardiomediastinal silhouette. Status post coronary artery bypass graft. 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 No definite acute abnormality is noted. Bony thorax is unremarkable. IMPRESSION: Stable chronic findings as noted above. No definite acute abnormality. Electronically Signed   By: Lynwood Landy Raddle M.D.   On: 07/18/2023 17:02   CT Renal Stone Study Result Date: 06/27/2023 CLINICAL DATA:  Abdominal/flank pain, stone suspected, evaluation for obstruction. Fell off the bed at the doctor's office. EXAM: CT ABDOMEN AND PELVIS WITHOUT CONTRAST TECHNIQUE: Multidetector CT imaging of the abdomen  and pelvis was performed following the standard protocol without IV contrast. RADIATION DOSE REDUCTION: This exam was performed according to the departmental dose-optimization program which includes automated exposure control, adjustment of the mA and/or kV according to patient size and/or use of iterative reconstruction technique. COMPARISON:  11/18/2022 FINDINGS: Lower chest: Calcified pleural plaques. Bronchial wall thickening and mucous plugging. Question right hilar adenopathy. Hepatobiliary: No acute abnormality. Pancreas: Coarse calcifications in the pancreatic head. No acute inflammation or ductal dilation. Spleen: Similar splenomegaly. Adrenals/Urinary Tract: Unremarkable adrenal glands. Mild left hydronephrosis with proximal hydroureter. No obstructing stone. Marked bladder wall thickening. Stomach/Bowel: Normal caliber large and small bowel. No bowel wall thickening moderate colonic stool load. Stomach and appendix are within normal limits. Vascular/Lymphatic: Aortic atherosclerosis. No enlarged abdominal or pelvic lymph nodes. Reproductive: Enlarged prostate. Other: No free intraperitoneal fluid or air. Musculoskeletal: No acute fracture. Chronic compression fracture of L1. IMPRESSION: 1. Mild left hydronephrosis with proximal hydroureter. No obstructing stone. 2. Marked bladder wall thickening, increased from prior. Differential considerations include obstructive uropathy from enlarged prostate, cystitis, or infiltrative malignancy. 3. Bronchial wall thickening and mucous plugs in the lower lungs. 4. Splenomegaly. Aortic Atherosclerosis (ICD10-I70.0). Electronically Signed   By: Norman Gatlin M.D.   On: 06/27/2023 20:27   DG Chest 2 View Result Date: 06/27/2023 CLINICAL DATA:  Wheezing.  Sarcoidosis. EXAM: CHEST - 2 VIEW COMPARISON:  Chest radiograph dated 11/18/2022. 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 No new consolidation. There is no pleural  effusion pneumothorax. Stable cardiomediastinal silhouette. Median sternotomy wires. No acute osseous pathology. IMPRESSION: 1. No acute cardiopulmonary process.  2. 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 Electronically Signed   By: Vanetta Chou M.D.   On: 06/27/2023 16:54   CT Head Wo Contrast Result Date: 06/27/2023 CLINICAL DATA:  Provided history: Head trauma, minor.  Fall. EXAM: CT HEAD WITHOUT CONTRAST CT CERVICAL SPINE WITHOUT CONTRAST TECHNIQUE: Multidetector CT imaging of the head and cervical spine was performed following the standard protocol without intravenous contrast. Multiplanar CT image reconstructions of the cervical spine were also generated. RADIATION DOSE REDUCTION: This exam was performed according to the departmental dose-optimization program which includes automated exposure control, adjustment of the mA and/or kV according to patient size and/or use of iterative reconstruction technique. COMPARISON:  Head CT 05/05/2021. Cervical spine CT 05/05/2021. Chest CT 08/27/2019. Thyroid ultrasound 05/16/2021. FINDINGS: CT HEAD FINDINGS Brain: Generalized cerebral atrophy. Commensurate prominence of the ventricles and sulci. 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 This is new from the prior head CT of 05/05/2021. Mild patchy and ill-defined hypoattenuation elsewhere within the cerebral white matter, nonspecific but compatible chronic small vessel ischemic disease. There is no acute intracranial hemorrhage. No demarcated cortical infarct. No extra-axial fluid collection. No evidence of an intracranial mass. No midline shift. Vascular: No hyperdense vessel.  Atherosclerotic calcifications. Skull: No calvarial fracture or aggressive osseous lesion. Apparent small burr hole within the right frontal calvarium. Sinuses/Orbits: No mass or acute finding within the imaged orbits. Mucosal thickening within the bilateral  frontal sinuses (mild right, moderate left). Bilateral ethmoid sinusitis (moderate right, severe left). Mucosal thickening within the bilateral sphenoid sinuses (mild right, moderate left). CT CERVICAL SPINE FINDINGS Alignment: Mild levocurvature of the cervical spine. Nonspecific reversal of the expected cervical lordosis. 2 mm grade 1 anterolisthesis at C2-C3 and C3-C4. 3 mm grade 1 anterolisthesis at C4-C5. Skull base and vertebrae: The basion-dental and atlanto-dental intervals are maintained.No evidence of acute fracture to the cervical spine. Soft tissues and spinal canal: No prevertebral fluid or swelling. No visible canal hematoma. Disc levels: Cervical spondylosis with multilevel disc space narrowing, disc bulges/central disc protrusions, posterior disc osteophyte complexes, uncovertebral hypertrophy and facet arthrosis. Disc space narrowing is greatest at C5-C6 and C6-C7 (advanced at these levels). Multilevel spinal canal narrowing. Most notably, disc protrusions contribute to apparent moderate spinal canal stenosis at C2-C3 and C3-C4, and a posterior disc osteophyte complex contributes to apparent moderate spinal canal stenosis at C6-C7. Multilevel bony neural foraminal narrowing. Upper chest: No acute finding. Prior median sternotomy. Chronic nodule/foci of scarring (with associated calcifications) again demonstrated within imaged portions of the lung apices. Per prior radiology records, the patient has a history of sarcoidosis. Other: Known 2.2 cm right thyroid lobe nodule. This nodule was previously assessed with thyroid ultrasound on 05/16/2021. Please refer to this prior examination for further description and for recommendations. IMPRESSION: CT head: 1. No evidence of an acute intracranial abnormality. 2. Parenchymal atrophy and chronic small vessel ischemic disease, as described. 3. Paranasal sinus disease at the imaged levels as outlined. CT cervical spine: 1. No evidence of an acute cervical  spine fracture. 2. Nonspecific reversal of the expected cervical lordosis. 3. Mild Levocurvature of the cervical spine. 4. Grade 1 anterolisthesis at C2-C3, C3-C4 and C4-C5. 5. Cervical spondylosis as described. Electronically Signed   By: Rockey Childs D.O.   On: 06/27/2023 16:03   CT Cervical Spine Wo Contrast Result Date: 06/27/2023 CLINICAL DATA:  Provided history: Head trauma, minor.  Fall. EXAM: CT HEAD WITHOUT CONTRAST CT CERVICAL SPINE WITHOUT CONTRAST  TECHNIQUE: Multidetector CT imaging of the head and cervical spine was performed following the standard protocol without intravenous contrast. Multiplanar CT image reconstructions of the cervical spine were also generated. RADIATION DOSE REDUCTION: This exam was performed according to the departmental dose-optimization program which includes automated exposure control, adjustment of the mA and/or kV according to patient size and/or use of iterative reconstruction technique. COMPARISON:  Head CT 05/05/2021. Cervical spine CT 05/05/2021. Chest CT 08/27/2019. Thyroid ultrasound 05/16/2021. FINDINGS: CT HEAD FINDINGS Brain: Generalized cerebral atrophy. Commensurate prominence of the ventricles and sulci. 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 This is new from the prior head CT of 05/05/2021. Mild patchy and ill-defined hypoattenuation elsewhere within the cerebral white matter, nonspecific but compatible chronic small vessel ischemic disease. There is no acute intracranial hemorrhage. No demarcated cortical infarct. No extra-axial fluid collection. No evidence of an intracranial mass. No midline shift. Vascular: No hyperdense vessel.  Atherosclerotic calcifications. Skull: No calvarial fracture or aggressive osseous lesion. Apparent small burr hole within the right frontal calvarium. Sinuses/Orbits: No mass or acute finding within the imaged orbits. Mucosal thickening within the bilateral frontal sinuses  (mild right, moderate left). Bilateral ethmoid sinusitis (moderate right, severe left). Mucosal thickening within the bilateral sphenoid sinuses (mild right, moderate left). CT CERVICAL SPINE FINDINGS Alignment: Mild levocurvature of the cervical spine. Nonspecific reversal of the expected cervical lordosis. 2 mm grade 1 anterolisthesis at C2-C3 and C3-C4. 3 mm grade 1 anterolisthesis at C4-C5. Skull base and vertebrae: The basion-dental and atlanto-dental intervals are maintained.No evidence of acute fracture to the cervical spine. Soft tissues and spinal canal: No prevertebral fluid or swelling. No visible canal hematoma. Disc levels: Cervical spondylosis with multilevel disc space narrowing, disc bulges/central disc protrusions, posterior disc osteophyte complexes, uncovertebral hypertrophy and facet arthrosis. Disc space narrowing is greatest at C5-C6 and C6-C7 (advanced at these levels). Multilevel spinal canal narrowing. Most notably, disc protrusions contribute to apparent moderate spinal canal stenosis at C2-C3 and C3-C4, and a posterior disc osteophyte complex contributes to apparent moderate spinal canal stenosis at C6-C7. Multilevel bony neural foraminal narrowing. Upper chest: No acute finding. Prior median sternotomy. Chronic nodule/foci of scarring (with associated calcifications) again demonstrated within imaged portions of the lung apices. Per prior radiology records, the patient has a history of sarcoidosis. Other: Known 2.2 cm right thyroid lobe nodule. This nodule was previously assessed with thyroid ultrasound on 05/16/2021. Please refer to this prior examination for further description and for recommendations. IMPRESSION: CT head: 1. No evidence of an acute intracranial abnormality. 2. Parenchymal atrophy and chronic small vessel ischemic disease, as described. 3. Paranasal sinus disease at the imaged levels as outlined. CT cervical spine: 1. No evidence of an acute cervical spine fracture. 2.  Nonspecific reversal of the expected cervical lordosis. 3. Mild Levocurvature of the cervical spine. 4. Grade 1 anterolisthesis at C2-C3, C3-C4 and C4-C5. 5. Cervical spondylosis as described. Electronically Signed   By: Rockey Childs D.O.   On: 06/27/2023 16:03    Microbiology: Results for orders placed or performed during the hospital encounter of 07/18/23  Resp panel by RT-PCR (RSV, Flu A&B, Covid) Anterior Nasal Swab     Status: None   Collection Time: 07/18/23  4:26 PM   Specimen: Anterior Nasal Swab  Result Value Ref Range Status   SARS Coronavirus 2 by RT PCR NEGATIVE NEGATIVE Final    Comment: (NOTE) SARS-CoV-2 target nucleic acids are NOT DETECTED.  The SARS-CoV-2 RNA is generally  detectable in upper respiratory specimens during the acute phase of infection. The lowest concentration of SARS-CoV-2 viral copies this assay can detect is 138 copies/mL. A negative result does not preclude SARS-Cov-2 infection and should not be used as the sole basis for treatment or other patient management decisions. A negative result may occur with  improper specimen collection/handling, submission of specimen other than nasopharyngeal swab, presence of viral mutation(s) within the areas targeted by this assay, and inadequate number of viral copies(<138 copies/mL). A negative result must be combined with clinical observations, patient history, and epidemiological information. The expected result is Negative.  Fact Sheet for Patients:  bloggercourse.com  Fact Sheet for Healthcare Providers:  seriousbroker.it  This test is no t yet approved or cleared by the [place]  FDA and  has been authorized for detection and/or diagnosis of SARS-CoV-2 by FDA under an Emergency Use Authorization (EUA). This EUA will remain  in effect (meaning this test can be used) for the duration of the COVID-19 declaration under Section 564(b)(1) of the Act,  21 U.S.C.section 360bbb-3(b)(1), unless the authorization is terminated  or revoked sooner.       Influenza A by PCR NEGATIVE NEGATIVE Final   Influenza B by PCR NEGATIVE NEGATIVE Final    Comment: (NOTE) The Xpert Xpress SARS-CoV-2/FLU/RSV plus assay is intended as an aid in the diagnosis of influenza from Nasopharyngeal swab specimens and should not be used as a sole basis for treatment. Nasal washings and aspirates are unacceptable for Xpert Xpress SARS-CoV-2/FLU/RSV testing.  Fact Sheet for Patients: bloggercourse.com  Fact Sheet for Healthcare Providers: seriousbroker.it  This test is not yet approved or cleared by the [place]  FDA and has been authorized for detection and/or diagnosis of SARS-CoV-2 by FDA under an Emergency Use Authorization (EUA). This EUA will remain in effect (meaning this test can be used) for the duration of the COVID-19 declaration under Section 564(b)(1) of the Act, 21 U.S.C. section 360bbb-3(b)(1), unless the authorization is terminated or revoked.     Resp Syncytial Virus by PCR NEGATIVE NEGATIVE Final    Comment: (NOTE) Fact Sheet for Patients: bloggercourse.com  Fact Sheet for Healthcare Providers: seriousbroker.it  This test is not yet approved or cleared by the [place]  FDA and has been authorized for detection and/or diagnosis of SARS-CoV-2 by FDA under an Emergency Use Authorization (EUA). This EUA will remain in effect (meaning this test can be used) for the duration of the COVID-19 declaration under Section 564(b)(1) of the Act, 21 U.S.C. section 360bbb-3(b)(1), unless the authorization is terminated or revoked.  Performed at [facility], [address] [place], [place] [address]   Blood Culture (routine x 2)     Status: None   Collection Time: 07/18/23  4:27 PM   Specimen: BLOOD  Result Value Ref Range  Status   Specimen Description BLOOD BLOOD RIGHT HAND  Final   Special Requests   Final    BOTTLES DRAWN AEROBIC AND ANAEROBIC Blood Culture adequate volume   Culture   Final    NO GROWTH 5 DAYS Performed at [facility], [address] [place], [place] [address]    Report Status 07/23/2023 FINAL  Final  Blood Culture (routine x 2)     Status: None   Collection Time: 07/18/23  4:27 PM   Specimen: BLOOD  Result Value Ref Range Status   Specimen Description BLOOD BLOOD RIGHT ARM  Final   Special Requests   Final    BOTTLES DRAWN AEROBIC AND  ANAEROBIC Blood Culture results may not be optimal due to an inadequate volume of blood received in culture bottles   Culture   Final    NO GROWTH 5 DAYS Performed at [facility], [address] [place], [place] [address]    Report Status 07/23/2023 FINAL  Final  Urine Culture     Status: Abnormal   Collection Time: 07/20/23 10:26 AM   Specimen: Urine, Random  Result Value Ref Range Status   Specimen Description   Final    URINE, RANDOM Performed at [facility], [address], [place], [place] [address]    Special Requests   Final    NONE Reflexed from [id number] Performed at [facility], [address] [place], [place] [address]    Culture 70,000 COLONIES/mL YEAST (A)  Final   Report Status 07/22/2023 FINAL  Final     Discharge time spent:  35 minutes.  Signed: Drue ONEIDA Potter, MD [facility] 07/24/2023

## 2023-07-24 NOTE — Plan of Care (Signed)
 Patient with generalized discomfort. Medicated with pain medication with short term relief. Vital signs remain stable. Will continue to monitor.   Problem: Education: Goal: Ability to describe self-care measures that may prevent or decrease complications (Diabetes Survival Skills Education) will improve Outcome: Not Progressing Goal: Individualized Educational Video(s) Outcome: Not Progressing   Problem: Coping: Goal: Ability to adjust to condition or change in health will improve Outcome: Not Progressing   Problem: Fluid Volume: Goal: Ability to maintain a balanced intake and output will improve Outcome: Not Progressing   Problem: Health Behavior/Discharge Planning: Goal: Ability to identify and utilize available resources and services will improve Outcome: Not Progressing Goal: Ability to manage health-related needs will improve Outcome: Not Progressing   Problem: Metabolic: Goal: Ability to maintain appropriate glucose levels will improve Outcome: Not Progressing   Problem: Nutritional: Goal: Maintenance of adequate nutrition will improve Outcome: Not Progressing Goal: Progress toward achieving an optimal weight will improve Outcome: Not Progressing   Problem: Skin Integrity: Goal: Risk for impaired skin integrity will decrease Outcome: Not Progressing   Problem: Tissue Perfusion: Goal: Adequacy of tissue perfusion will improve Outcome: Not Progressing   Problem: Education: Goal: Knowledge of General Education information will improve Description: Including pain rating scale, medication(s)/side effects and non-pharmacologic comfort measures Outcome: Not Progressing   Problem: Health Behavior/Discharge Planning: Goal: Ability to manage health-related needs will improve Outcome: Not Progressing   Problem: Clinical Measurements: Goal: Ability to maintain clinical measurements within normal limits will improve Outcome: Not Progressing Goal: Will remain free from  infection Outcome: Not Progressing Goal: Diagnostic test results will improve Outcome: Not Progressing Goal: Respiratory complications will improve Outcome: Not Progressing Goal: Cardiovascular complication will be avoided Outcome: Not Progressing   Problem: Activity: Goal: Risk for activity intolerance will decrease Outcome: Not Progressing   Problem: Nutrition: Goal: Adequate nutrition will be maintained Outcome: Not Progressing   Problem: Coping: Goal: Level of anxiety will decrease Outcome: Not Progressing   Problem: Elimination: Goal: Will not experience complications related to bowel motility Outcome: Not Progressing Goal: Will not experience complications related to urinary retention Outcome: Not Progressing   Problem: Pain Managment: Goal: General experience of comfort will improve and/or be controlled Outcome: Not Progressing   Problem: Safety: Goal: Ability to remain free from injury will improve Outcome: Not Progressing   Problem: Skin Integrity: Goal: Risk for impaired skin integrity will decrease Outcome: Not Progressing

## 2023-07-24 NOTE — Progress Notes (Signed)
 Mobility Specialist - Progress Note   07/24/23 1000  Mobility  Activity Transferred from bed to chair  Level of Assistance Dependent, patient does less than 25%  Activity Response Tolerated well  Mobility visit 1 Mobility     Pt sleeping on arrival, awakened to voice/touch. Pt agreeable to activity and achieved EOB sitting with maxA. Voiced pain in scrotum. Squat-pivot transfer to chair with +2 assist. Pt left in chair with alarm set, needs in reach. Breakfast tray set up in front of pt.    Lennette Seip Mobility Specialist 07/24/23, 10:07 AM

## 2023-10-05 ENCOUNTER — Emergency Department

## 2023-10-05 ENCOUNTER — Inpatient Hospital Stay
Admission: EM | Admit: 2023-10-05 | Discharge: 2023-10-07 | DRG: 309 | Disposition: A | Discharge status: Hospice | Source: Skilled Nursing Facility | Attending: Internal Medicine | Admitting: Internal Medicine

## 2023-10-05 ENCOUNTER — Encounter: Payer: Self-pay | Admitting: Emergency Medicine

## 2023-10-05 ENCOUNTER — Other Ambulatory Visit: Payer: Self-pay

## 2023-10-05 DIAGNOSIS — I5032 Chronic diastolic (congestive) heart failure: Secondary | ICD-10-CM | POA: Diagnosis present

## 2023-10-05 DIAGNOSIS — Z87442 Personal history of urinary calculi: Secondary | ICD-10-CM

## 2023-10-05 DIAGNOSIS — J208 Acute bronchitis due to other specified organisms: Secondary | ICD-10-CM | POA: Diagnosis present

## 2023-10-05 DIAGNOSIS — I10 Essential (primary) hypertension: Secondary | ICD-10-CM | POA: Diagnosis present

## 2023-10-05 DIAGNOSIS — I130 Hypertensive heart and chronic kidney disease with heart failure and stage 1 through stage 4 chronic kidney disease, or unspecified chronic kidney disease: Secondary | ICD-10-CM | POA: Diagnosis present

## 2023-10-05 DIAGNOSIS — E785 Hyperlipidemia, unspecified: Secondary | ICD-10-CM | POA: Diagnosis present

## 2023-10-05 DIAGNOSIS — I959 Hypotension, unspecified: Secondary | ICD-10-CM

## 2023-10-05 DIAGNOSIS — Z794 Long term (current) use of insulin: Secondary | ICD-10-CM

## 2023-10-05 DIAGNOSIS — E1122 Type 2 diabetes mellitus with diabetic chronic kidney disease: Secondary | ICD-10-CM | POA: Diagnosis present

## 2023-10-05 DIAGNOSIS — Z79899 Other long term (current) drug therapy: Secondary | ICD-10-CM

## 2023-10-05 DIAGNOSIS — J9811 Atelectasis: Secondary | ICD-10-CM | POA: Diagnosis present

## 2023-10-05 DIAGNOSIS — I5033 Acute on chronic diastolic (congestive) heart failure: Secondary | ICD-10-CM | POA: Diagnosis present

## 2023-10-05 DIAGNOSIS — Z66 Do not resuscitate: Secondary | ICD-10-CM | POA: Diagnosis present

## 2023-10-05 DIAGNOSIS — Z882 Allergy status to sulfonamides status: Secondary | ICD-10-CM

## 2023-10-05 DIAGNOSIS — I252 Old myocardial infarction: Secondary | ICD-10-CM

## 2023-10-05 DIAGNOSIS — J45909 Unspecified asthma, uncomplicated: Secondary | ICD-10-CM | POA: Diagnosis present

## 2023-10-05 DIAGNOSIS — Z8249 Family history of ischemic heart disease and other diseases of the circulatory system: Secondary | ICD-10-CM

## 2023-10-05 DIAGNOSIS — I4891 Unspecified atrial fibrillation: Secondary | ICD-10-CM | POA: Diagnosis present

## 2023-10-05 DIAGNOSIS — I480 Paroxysmal atrial fibrillation: Secondary | ICD-10-CM | POA: Diagnosis not present

## 2023-10-05 DIAGNOSIS — Z833 Family history of diabetes mellitus: Secondary | ICD-10-CM

## 2023-10-05 DIAGNOSIS — E1129 Type 2 diabetes mellitus with other diabetic kidney complication: Secondary | ICD-10-CM | POA: Diagnosis present

## 2023-10-05 DIAGNOSIS — Z91048 Other nonmedicinal substance allergy status: Secondary | ICD-10-CM

## 2023-10-05 DIAGNOSIS — F0390 Unspecified dementia without behavioral disturbance: Secondary | ICD-10-CM | POA: Diagnosis present

## 2023-10-05 DIAGNOSIS — Z7984 Long term (current) use of oral hypoglycemic drugs: Secondary | ICD-10-CM

## 2023-10-05 DIAGNOSIS — N1832 Chronic kidney disease, stage 3b: Secondary | ICD-10-CM | POA: Diagnosis present

## 2023-10-05 DIAGNOSIS — Z951 Presence of aortocoronary bypass graft: Secondary | ICD-10-CM

## 2023-10-05 DIAGNOSIS — R000 Tachycardia, unspecified: Secondary | ICD-10-CM | POA: Diagnosis present

## 2023-10-05 DIAGNOSIS — N179 Acute kidney failure, unspecified: Secondary | ICD-10-CM | POA: Diagnosis present

## 2023-10-05 DIAGNOSIS — I509 Heart failure, unspecified: Secondary | ICD-10-CM | POA: Diagnosis not present

## 2023-10-05 DIAGNOSIS — N400 Enlarged prostate without lower urinary tract symptoms: Secondary | ICD-10-CM | POA: Diagnosis present

## 2023-10-05 DIAGNOSIS — I4892 Unspecified atrial flutter: Secondary | ICD-10-CM | POA: Diagnosis present

## 2023-10-05 DIAGNOSIS — J9601 Acute respiratory failure with hypoxia: Secondary | ICD-10-CM

## 2023-10-05 DIAGNOSIS — Z881 Allergy status to other antibiotic agents status: Secondary | ICD-10-CM

## 2023-10-05 DIAGNOSIS — Z87891 Personal history of nicotine dependence: Secondary | ICD-10-CM

## 2023-10-05 DIAGNOSIS — Z9181 History of falling: Secondary | ICD-10-CM

## 2023-10-05 DIAGNOSIS — I2510 Atherosclerotic heart disease of native coronary artery without angina pectoris: Secondary | ICD-10-CM | POA: Diagnosis present

## 2023-10-05 DIAGNOSIS — I4510 Unspecified right bundle-branch block: Secondary | ICD-10-CM | POA: Diagnosis present

## 2023-10-05 HISTORY — DX: Repeated falls: R29.6

## 2023-10-05 HISTORY — DX: Chronic kidney disease, stage 3 unspecified: N18.30

## 2023-10-05 HISTORY — DX: Unspecified right bundle-branch block: I45.10

## 2023-10-05 HISTORY — DX: Calculus of kidney: N20.0

## 2023-10-05 HISTORY — DX: Paroxysmal atrial fibrillation: I48.0

## 2023-10-05 HISTORY — DX: Sarcoidosis of lung: D86.0

## 2023-10-05 HISTORY — DX: Nontraumatic intracerebral hemorrhage, intraventricular: I61.5

## 2023-10-05 HISTORY — DX: Unspecified dementia, unspecified severity, without behavioral disturbance, psychotic disturbance, mood disturbance, and anxiety: F03.90

## 2023-10-05 HISTORY — DX: Chronic diastolic (congestive) heart failure: I50.32

## 2023-10-05 HISTORY — DX: Sepsis, unspecified organism: A41.9

## 2023-10-05 LAB — CBC WITH DIFFERENTIAL/PLATELET
Abs Immature Granulocytes: 0.03 10*3/uL (ref 0.00–0.07)
Basophils Absolute: 0 10*3/uL (ref 0.0–0.1)
Basophils Relative: 1 %
Eosinophils Absolute: 0.3 10*3/uL (ref 0.0–0.5)
Eosinophils Relative: 3 %
HCT: 27.4 % — ABNORMAL LOW (ref 39.0–52.0)
Hemoglobin: 8.5 g/dL — ABNORMAL LOW (ref 13.0–17.0)
Immature Granulocytes: 0 %
Lymphocytes Relative: 8 %
Lymphs Abs: 0.7 10*3/uL (ref 0.7–4.0)
MCH: 25.8 pg — ABNORMAL LOW (ref 26.0–34.0)
MCHC: 31 g/dL (ref 30.0–36.0)
MCV: 83 fL (ref 80.0–100.0)
Monocytes Absolute: 0.8 10*3/uL (ref 0.1–1.0)
Monocytes Relative: 9 %
Neutro Abs: 6.8 10*3/uL (ref 1.7–7.7)
Neutrophils Relative %: 79 %
Platelets: 184 10*3/uL (ref 150–400)
RBC: 3.3 MIL/uL — ABNORMAL LOW (ref 4.22–5.81)
RDW: 15.5 % (ref 11.5–15.5)
WBC: 8.7 10*3/uL (ref 4.0–10.5)
nRBC: 0 % (ref 0.0–0.2)

## 2023-10-05 LAB — COMPREHENSIVE METABOLIC PANEL WITH GFR
ALT: 10 U/L (ref 0–44)
AST: 11 U/L — ABNORMAL LOW (ref 15–41)
Albumin: 2.7 g/dL — ABNORMAL LOW (ref 3.5–5.0)
Alkaline Phosphatase: 51 U/L (ref 38–126)
Anion gap: 14 (ref 5–15)
BUN: 70 mg/dL — ABNORMAL HIGH (ref 8–23)
CO2: 18 mmol/L — ABNORMAL LOW (ref 22–32)
Calcium: 8.4 mg/dL — ABNORMAL LOW (ref 8.9–10.3)
Chloride: 100 mmol/L (ref 98–111)
Creatinine, Ser: 3.01 mg/dL — ABNORMAL HIGH (ref 0.61–1.24)
GFR, Estimated: 20 mL/min — ABNORMAL LOW (ref >60)
Glucose, Bld: 193 mg/dL — ABNORMAL HIGH (ref 70–99)
Potassium: 4.9 mmol/L (ref 3.5–5.1)
Sodium: 132 mmol/L — ABNORMAL LOW (ref 135–145)
Total Bilirubin: 0.5 mg/dL (ref 0.0–1.2)
Total Protein: 6.3 g/dL — ABNORMAL LOW (ref 6.5–8.1)

## 2023-10-05 LAB — BRAIN NATRIURETIC PEPTIDE: B Natriuretic Peptide: 487.3 pg/mL — ABNORMAL HIGH (ref 0.0–100.0)

## 2023-10-05 LAB — TROPONIN I (HIGH SENSITIVITY)
Troponin I (High Sensitivity): 12 ng/L (ref <18)
Troponin I (High Sensitivity): 13 ng/L (ref <18)

## 2023-10-05 MED ORDER — INSULIN ASPART 100 UNIT/ML IJ SOLN
0.0000 [IU] | Freq: Three times a day (TID) | INTRAMUSCULAR | Status: DC
  Administered 2023-10-06 (×3): 1 [IU] via SUBCUTANEOUS
  Administered 2023-10-07: 2 [IU] via SUBCUTANEOUS
  Filled 2023-10-05 (×4): qty 1

## 2023-10-05 MED ORDER — HYDROCODONE-ACETAMINOPHEN 5-325 MG PO TABS: 1.0000 | ORAL_TABLET | ORAL | Status: DC | PRN

## 2023-10-05 MED ORDER — ENOXAPARIN SODIUM 30 MG/0.3ML IJ SOSY
30.0000 mg | PREFILLED_SYRINGE | INTRAMUSCULAR | Status: DC
  Administered 2023-10-06 – 2023-10-07 (×2): 30 mg via SUBCUTANEOUS
  Filled 2023-10-05 (×2): qty 0.3

## 2023-10-05 MED ORDER — ACETAMINOPHEN 325 MG PO TABS: 650.0000 mg | ORAL_TABLET | Freq: Four times a day (QID) | ORAL | Status: DC | PRN

## 2023-10-05 MED ORDER — INSULIN GLARGINE-YFGN 100 UNIT/ML ~~LOC~~ SOLN
15.0000 [IU] | Freq: Every day | SUBCUTANEOUS | Status: DC
  Administered 2023-10-06: 15 [IU] via SUBCUTANEOUS
  Filled 2023-10-05: qty 0.15

## 2023-10-05 MED ORDER — ATORVASTATIN CALCIUM 20 MG PO TABS
20.0000 mg | ORAL_TABLET | Freq: Every day | ORAL | Status: DC
  Administered 2023-10-06: 20 mg via ORAL
  Filled 2023-10-05: qty 1

## 2023-10-05 MED ORDER — ACETAMINOPHEN 650 MG RE SUPP
650.0000 mg | Freq: Four times a day (QID) | RECTAL | Status: DC | PRN
Start: 2023-10-05 — End: 2023-10-07

## 2023-10-05 MED ORDER — INSULIN ASPART 100 UNIT/ML IJ SOLN: 0.0000 [IU] | Freq: Every day | INTRAMUSCULAR | Status: DC

## 2023-10-05 MED ORDER — ONDANSETRON HCL 4 MG PO TABS: 4.0000 mg | ORAL_TABLET | Freq: Four times a day (QID) | ORAL | Status: DC | PRN

## 2023-10-05 MED ORDER — FUROSEMIDE 10 MG/ML IJ SOLN
40.0000 mg | Freq: Once | INTRAMUSCULAR | Status: AC
  Administered 2023-10-05: 40 mg via INTRAVENOUS
  Filled 2023-10-05: qty 4

## 2023-10-05 MED ORDER — MORPHINE SULFATE (PF) 2 MG/ML IV SOLN: 2.0000 mg | INTRAVENOUS | Status: DC | PRN

## 2023-10-05 MED ORDER — ONDANSETRON HCL 4 MG/2ML IJ SOLN
4.0000 mg | Freq: Four times a day (QID) | INTRAMUSCULAR | Status: DC | PRN
Start: 2023-10-05 — End: 2023-10-07

## 2023-10-05 MED ORDER — ALBUTEROL SULFATE (2.5 MG/3ML) 0.083% IN NEBU
2.5000 mg | INHALATION_SOLUTION | RESPIRATORY_TRACT | Status: DC | PRN
  Administered 2023-10-06 (×2): 2.5 mg via RESPIRATORY_TRACT
  Filled 2023-10-05 (×2): qty 3

## 2023-10-05 MED ORDER — FUROSEMIDE 10 MG/ML IJ SOLN: 20.0000 mg | Freq: Two times a day (BID) | INTRAMUSCULAR | Status: DC

## 2023-10-05 NOTE — ED Notes (Signed)
 Peter Robertson (Medical POA for pt) [phone] called to get an update as well as give me information about him.  Stated he has dementia and he is a DNR.  She said he said he will take antibiotics but does not want any heroic life saving stuff like CPR, machines or vasoconstrictors.  Told her I would get this noted and if any questions I will contact her.

## 2023-10-05 NOTE — ED Provider Notes (Signed)
 [facility] Provider Note   Event Date/Time   First MD Initiated Contact with Patient 10/05/23 1942     (approximate) History  Shortness of Breath  HPI Peter Robertson is a 83 y.o. male with a past medical history of heart failure, paroxysmal atrial fibrillation, stage IIIb CKD, and type 2 diabetes who presents from [facility] via EMS after reportedly having difficulty breathing.  Patient was put on oxygen however EMS found patient to be 98% on room air.  Patient was in no respiratory distress upon EMS arrival and he has not needed oxygen throughout transport.  Patient does have Rales in bilateral lung fields according to EMS however patient has no complaints at this time. ROS: Patient currently denies any vision changes, tinnitus, difficulty speaking, facial droop, sore throat, chest pain, shortness of breath, abdominal pain, nausea/vomiting/diarrhea, dysuria, or weakness/numbness/paresthesias in any extremity   Physical Exam  Triage Vital Signs: ED Triage Vitals  Encounter Vitals Group     BP      Systolic BP Percentile      Diastolic BP Percentile      Pulse      Resp      Temp      Temp src      SpO2      Weight      Height      Head Circumference      Peak Flow      Pain Score      Pain Loc      Pain Education      Exclude from Growth Chart    Most recent vital signs: Vitals:   10/05/23 1944  BP: (!) 155/127  Pulse: 63  Temp: 98.5 F (36.9 C)  SpO2: 98%   General: Awake, oriented x4. CV:  Good peripheral perfusion.  Resp:  Normal effort.  Rales over bilateral lung fields Abd:  No distention.  Other:  Elderly well-developed, well-nourished Caucasian male resting comfortably in no acute distress ED Results / Procedures / Treatments  Labs (all labs ordered are listed, but only abnormal results are displayed) Labs Reviewed  COMPREHENSIVE METABOLIC PANEL WITH GFR - Abnormal; Notable for the following components:      Result Value   Sodium  132 (*)    CO2 18 (*)    Glucose, Bld 193 (*)    BUN 70 (*)    Creatinine, Ser 3.01 (*)    Calcium  8.4 (*)    Total Protein 6.3 (*)    Albumin  2.7 (*)    AST 11 (*)    GFR, Estimated 20 (*)    All other components within normal limits  BRAIN NATRIURETIC PEPTIDE - Abnormal; Notable for the following components:   B Natriuretic Peptide 487.3 (*)    All other components within normal limits  CBC WITH DIFFERENTIAL/PLATELET - Abnormal; Notable for the following components:   RBC 3.30 (*)    Hemoglobin 8.5 (*)    HCT 27.4 (*)    MCH 25.8 (*)    All other components within normal limits  TROPONIN I (HIGH SENSITIVITY)  TROPONIN I (HIGH SENSITIVITY)   EKG ED ECG REPORT I, Charleen Conn, the attending physician, personally viewed and interpreted this ECG. Date: 10/05/2023 EKG Time: 2009 Rate: 141 Rhythm: Atrial fibrillation with rapid ventricular response QRS Axis: normal Intervals: Right bundle branch block ST/T Wave abnormalities: normal Narrative Interpretation: Atrial fibrillation with rapid ventricular response and RBBB.  No evidence of acute ischemia RADIOLOGY  ED MD interpretation: Single view portable chest x-ray independently interpreted and shows extensive bilateral calcifications consistent with sarcoidosis as well as mild right basilar atelectasis and or infiltrate with small right pleural effusion -Agree with radiology assessment Official radiology report(s): DG Chest Port 1 View Result Date: 10/05/2023 CLINICAL DATA:  Difficulty breathing. EXAM: PORTABLE CHEST 1 VIEW COMPARISON:  July 18, 2023 FINDINGS: Multiple sternal wires and vascular clips are seen. The heart size and mediastinal contours are within normal limits. Stable areas of calcification are again seen throughout both lungs. Mild right basilar atelectasis and/or infiltrate is seen. A small right pleural effusion is suspected. No pneumothorax is identified. Multilevel degenerative changes are seen throughout  the thoracic spine. IMPRESSION: 1. Evidence of prior median sternotomy/CABG. 2. 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 3. Mild right basilar atelectasis and/or infiltrate with a small right pleural effusion. Electronically Signed   By: Virgle Grime M.D.   On: 10/05/2023 22:21   PROCEDURES: Critical Care performed: Yes, see critical care procedure note(s) .1-3 Lead EKG Interpretation  Performed by: Charleen Conn, MD Authorized by: Charleen Conn, MD     Interpretation: normal     ECG rate:  61   ECG rate assessment: normal     Rhythm: sinus rhythm     Ectopy: none     Conduction: normal   CRITICAL CARE Performed by: Demaurion Dicioccio K Tashaun Obey  Total critical care time: 31 minutes  Critical care time was exclusive of separately billable procedures and treating other patients.  Critical care was necessary to treat or prevent imminent or life-threatening deterioration.  Critical care was time spent personally by me on the following activities: development of treatment plan with patient and/or surrogate as well as nursing, discussions with consultants, evaluation of patient's response to treatment, examination of patient, obtaining history from patient or surrogate, ordering and performing treatments and interventions, ordering and review of laboratory studies, ordering and review of radiographic studies, pulse oximetry and re-evaluation of patient's condition.  MEDICATIONS ORDERED IN ED: Medications  furosemide  (LASIX ) injection 40 mg (40 mg Intravenous Given 10/05/23 2316)   IMPRESSION / MDM / ASSESSMENT AND PLAN / ED COURSE  I reviewed the triage vital signs and the nursing notes.                             The patient is on the cardiac monitor to evaluate for evidence of arrhythmia and/or significant heart rate changes. Patient's presentation is most consistent with acute presentation with potential threat to life or bodily function. Endorses dyspnea, endorses  LE edema Denies Non adherence to medication regimen  Workup: ECG, CBC, BMP, Troponin, BNP, CXR Findings: EKG: No STEMI and no evidence of Brugada's sign, delta wave, epsilon wave, significantly prolonged QTc, or malignant arrhythmia. BNP: 487 CXR: Right-sided pleural effusion Based on history, exam and findings, presentation most consistent with acute on chronic heart failure. Low suspicion for PNA, ACS, tamponade, aortic dissection. Interventions: Oxygen, Diuresis  Reassessment: Symptoms improved in ED with oxygen and diuresis  Disposition (Stable but not significantly improved): Admit to medicine for further monitoring and for improvement of medication regimen to control symptoms.    FINAL CLINICAL IMPRESSION(S) / ED DIAGNOSES   Final diagnoses:  Acute on chronic congestive heart failure, unspecified heart failure type (HCC)  Acute respiratory failure with hypoxia (HCC)   Rx / DC Orders   ED Discharge Orders     None  Note:  This document was prepared using Dragon voice recognition software and may include unintentional dictation errors.   Ladora Osterberg K, MD 10/05/23 [phone]

## 2023-10-05 NOTE — H&P (Signed)
 History and Physical    Patient: Peter Robertson ZOX:[id number] DOB: [birth year] DOA: 10/05/2023 DOS: the patient was seen and examined on 10/05/2023 PCP: System, Provider Not In  Patient coming from: SNF  Chief Complaint:  Chief Complaint  Patient presents with   Shortness of Breath    HPI: Peter Robertson is a 83 y.o. male with medical history significant for hypertension, hyperlipidemia, diabetes mellitus, asthma, CAD, CKD-3A, BPH,  vertigo, dementia, dCHF, kidney stone, A-fib not on anticoagulants being admitted with acute CHF exacerbation after presenting with shortness of breath in the setting of an RSV infection the week prior.  O2 sats were 98% on room air but he was placed on O2 at 2 L for comfort. Chart reviewed.  Patient was admitted in February of this year with septic shock secondary to UTI and has had several prior hospitalizations. ED course and data review:Peter Robertson  Vitals within normal limits.  BP slightly elevated at 155/127-recheck ordered as it was normal at 110/73 with EMS. Labs notable for troponin 13 and BNP 48793 point Creat 3, up from baseline of 2 just a couple months prior with bicarb 18 Hemoglobin 8.5 which is baseline for him EKG, personally viewed and interpreted showing A-fib with RBBB at 141 chest x-ray showing chronic findings and a possible mild right basilar atelectasis and/or infiltrate with a small right pleural effusion. Patient given a dose of Lasix  IV and hospitalist consulted for admission.  Of note: While awaiting admission patient's POA [name] Welch, called to update that he is DNR and wants no CPR, machines of vasoconstrictors     Past Medical History:  Diagnosis Date   Asthma    BPH (benign prostatic hyperplasia)    Chronic kidney disease    Kidney Stones   Coronary artery disease    Diabetes mellitus without complication (HCC)    Heart murmur    Hyperlipidemia    Hypertension    Myocardial infarction [facility])    Past Surgical History:  Procedure  Laterality Date   COLONOSCOPY WITH PROPOFOL  N/A 12/03/2014   Procedure: COLONOSCOPY WITH PROPOFOL ;  Surgeon: Cassie Click, MD;  Location: [facility] ENDOSCOPY;  Service: Endoscopy;  Laterality: N/A;   CORONARY ARTERY BYPASS GRAFT     LEFT HEART CATH AND CORS/GRAFTS ANGIOGRAPHY N/A 05/22/2018   Procedure: LEFT HEART CATH AND CORS/GRAFTS ANGIOGRAPHY poss PCI;  Surgeon: Devorah Fonder, MD;  Location: [facility] INVASIVE CV LAB;  Service: Cardiovascular;  Laterality: N/A;   Social History:  reports that he quit smoking about 39 years ago. He has never used smokeless tobacco. He reports that he does not drink alcohol and does not use drugs.  Allergies  Allergen Reactions   Sulfamethoxazole-Trimethoprim Other (See Comments)    dizziness dizziness dizziness    Tape Rash    blisters    Family History  Problem Relation Age of Onset   Diabetes Mother    Cancer Mother    Hypertension Father    Hypertension Brother    Diabetes Brother     Prior to Admission medications   Medication Sig Start Date End Date Taking? Authorizing Provider  acetaminophen  (TYLENOL ) 325 MG tablet Take 2 tablets (650 mg total) by mouth every 6 (six) hours as needed for mild pain or fever. 05/17/21   Ree Candy, MD  albuterol  (PROVENTIL ) (2.5 MG/3ML) 0.083% nebulizer solution Take 2.5 mg by nebulization every 4 (four) hours as needed.    [provider]  alum & mag hydroxide-simeth (MAALOX/MYLANTA) 200-200-20 MG/5ML suspension  Take 15 mLs by mouth every 6 (six) hours as needed for indigestion or heartburn. 05/23/21   Luna Salinas, MD  atorvastatin  (LIPITOR) 20 MG tablet Take 20 mg by mouth at bedtime.    [provider]  cholecalciferol  (VITAMIN D3) 25 MCG (1000 UT) tablet Take 1,000 Units by mouth daily.    [provider]  docusate sodium  (COLACE) 100 MG capsule Take 1 capsule (100 mg total) by mouth 2 (two) times daily as needed for mild constipation. 11/27/22   Amin, Sumayya, MD   empagliflozin (JARDIANCE) 10 MG TABS tablet Take 10 mg by mouth daily. 08/10/22   [provider]  feeding supplement (ENSURE ENLIVE / ENSURE PLUS) LIQD Take 237 mLs by mouth 3 (three) times daily between meals. 11/27/22   Luna Salinas, MD  gabapentin  (NEURONTIN ) 300 MG capsule Take 1 capsule (300 mg total) by mouth 3 (three) times daily. Patient taking differently: Take 300 mg by mouth 2 (two) times daily. 11/27/22   Amin, Sumayya, MD  insulin  glargine (LANTUS ) 100 UNIT/ML injection Inject 0.3 mLs (30 Units total) into the skin daily. Patient taking differently: Inject 32 Units into the skin at bedtime. PLEASE MONITOR RESIDENT CLOSELY AFTER GIVING INSULIN  05/17/21   Ree Candy, MD  insulin  lispro (HUMALOG) 100 UNIT/ML KwikPen Inject 2-12 Units into the skin 3 times daily with meals, bedtime and 2 AM. Sliding scale insulin  < 60 = Call MD 200-249 = 2 units 250-299 = 4 units 300-349 = 6 units 350-359 = 8 units 400-449 = 10 units 450-499 = 12 units >500 = Call MD    [provider]  midodrine  (PROAMATINE ) 10 MG tablet Take 1 tablet (10 mg total) by mouth 3 (three) times daily with meals. 07/24/23   Ezzard Holms, MD  nitroGLYCERIN  (NITROSTAT ) 0.4 MG SL tablet Place 0.4 mg under the tongue every 5 (five) minutes as needed for chest pain.     [provider]  ondansetron  (ZOFRAN ) 4 MG tablet Take 1 tablet (4 mg total) by mouth every 6 (six) hours as needed for nausea. 11/27/22   Amin, Sumayya, MD  sertraline  (ZOLOFT ) 50 MG tablet Take 1 tablet by mouth daily. 08/28/22   [provider]  tamsulosin  (FLOMAX ) 0.4 MG CAPS capsule Take 0.8 mg by mouth at bedtime.     [provider]  Zinc  Oxide (BOUDREAUXS BUTT PASTE) 16 % OINT Apply 1 Application topically 3 (three) times daily as needed.    [provider]    Physical Exam: Vitals:   10/05/23 1944 10/05/23 1949  BP: (!) 155/127   Pulse: 63   Temp: 98.5 F (36.9 C)   TempSrc: Oral    SpO2: 98%   Weight:  75.4 kg  Height:  5\' 6"  (1.676 m)   Physical Exam Vitals and nursing note reviewed.  Constitutional:      General: He is not in acute distress.    Comments: Exie Holler male.  Conversational dyspnea  HENT:     Head: Normocephalic and atraumatic.  Cardiovascular:     Rate and Rhythm: Tachycardia present. Rhythm irregular.     Heart sounds: Normal heart sounds.  Pulmonary:     Effort: Tachypnea present.     Breath sounds: Normal breath sounds.  Abdominal:     Palpations: Abdomen is soft.     Tenderness: There is no abdominal tenderness.  Neurological:     Mental Status: Mental status is at baseline.     Labs on Admission: I have  personally reviewed following labs and imaging studies  CBC: Recent Labs  Lab 10/05/23 1936  WBC 8.7  NEUTROABS 6.8  HGB 8.5*  HCT 27.4*  MCV 83.0  PLT 184   Basic Metabolic Panel: Recent Labs  Lab 10/05/23 1936  NA 132*  K 4.9  CL 100  CO2 18*  GLUCOSE 193*  BUN 70*  CREATININE 3.01*  CALCIUM  8.4*   GFR: Estimated Creatinine Clearance: 16.8 mL/min (A) (by C-G formula based on SCr of 3.01 mg/dL (H)). Liver Function Tests: Recent Labs  Lab 10/05/23 1936  AST 11*  ALT 10  ALKPHOS 51  BILITOT 0.5  PROT 6.3*  ALBUMIN  2.7*   No results for input(s): "LIPASE", "AMYLASE" in the last 168 hours. No results for input(s): "AMMONIA" in the last 168 hours. Coagulation Profile: No results for input(s): "INR", "PROTIME" in the last 168 hours. Cardiac Enzymes: No results for input(s): "CKTOTAL", "CKMB", "CKMBINDEX", "TROPONINI" in the last 168 hours. BNP (last 3 results) No results for input(s): "PROBNP" in the last 8760 hours. HbA1C: No results for input(s): "HGBA1C" in the last 72 hours. CBG: No results for input(s): "GLUCAP" in the last 168 hours. Lipid Profile: No results for input(s): "CHOL", "HDL", "LDLCALC", "TRIG", "CHOLHDL", "LDLDIRECT" in the last 72 hours. Thyroid Function Tests: No results for  input(s): "TSH", "T4TOTAL", "FREET4", "T3FREE", "THYROIDAB" in the last 72 hours. Anemia Panel: No results for input(s): "VITAMINB12", "FOLATE", "FERRITIN", "TIBC", "IRON", "RETICCTPCT" in the last 72 hours. Urine analysis:    Component Value Date/Time   COLORURINE YELLOW (A) 07/20/2023 1026   APPEARANCEUR TURBID (A) 07/20/2023 1026   LABSPEC 1.011 07/20/2023 1026   PHURINE 5.0 07/20/2023 1026   GLUCOSEU 150 (A) 07/20/2023 1026   HGBUR LARGE (A) 07/20/2023 1026   BILIRUBINUR NEGATIVE 07/20/2023 1026   KETONESUR NEGATIVE 07/20/2023 1026   PROTEINUR 100 (A) 07/20/2023 1026   NITRITE NEGATIVE 07/20/2023 1026   LEUKOCYTESUR MODERATE (A) 07/20/2023 1026    Radiological Exams on Admission: DG Chest Port 1 View Result Date: 10/05/2023 CLINICAL DATA:  Difficulty breathing. EXAM: PORTABLE CHEST 1 VIEW COMPARISON:  July 18, 2023 FINDINGS: Multiple sternal wires and vascular clips are seen. The heart size and mediastinal contours are within normal limits. Stable areas of calcification are again seen throughout both lungs. Mild right basilar atelectasis and/or infiltrate is seen. A small right pleural effusion is suspected. No pneumothorax is identified. Multilevel degenerative changes are seen throughout the thoracic spine. IMPRESSION: 1. Evidence of prior median sternotomy/CABG. 2. 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 3. Mild right basilar atelectasis and/or infiltrate with a small right pleural effusion. Electronically Signed   By: Virgle Grime M.D.   On: 10/05/2023 22:21     Data Reviewed: Relevant notes from primary care and specialist visits, past discharge summaries as available in EHR, including Care Everywhere. Prior diagnostic testing as pertinent to current admission diagnoses Updated medications and problem lists for reconciliation ED course, including vitals, labs, imaging, treatment and response to treatment Triage notes, nursing and pharmacy notes  and ED provider's notes Notable results as noted in HPI   Assessment and Plan: * Paroxysmal atrial fibrillation with RVR (HCC) Continue rate control treatment Addendum:  -Following admission patient t with persistent RVR with rate in the 150s, with soft BP little improvement with IV digoxin . - Given chronic findings on chest x-ray, trial of IV fluid bolus with reassessment. - Hesitant to start amiodarone  at this time but will consider if unable to get rate under control -Cardiology  consult in the a.m.    10/06/2023    2:30 AM 10/06/2023    1:50 AM 10/05/2023    7:49 PM  Vitals with BMI  Height   5\' 6"   Weight   166 lbs 4 oz  BMI   26.84  Systolic 94 94   Diastolic 71 75   Pulse 154 157      Hypotension Likely secondary to A-fib as well as Lasix  administered in the ED.   Not meeting sepsis criteria patient with rapid A-fib running soft blood pressures with decent MAP Trial of IV NS bolus in spite of diagnosis of CHF but chest x-ray is clear Considering midodrine  but awaiting improvement with bolus Hesitant to consider amiodarone  due to goals of care.  Patient is on hospice  Acute renal failure superimposed on stage 3b chronic kidney disease (HCC) Creatinine 3 up from 2 a couple months prior Patient appears clinically dry Trial of IV fluid bolus Monitor renal function and avoid nephrotoxins  Acute on chronic diastolic CHF (congestive heart failure) (HCC) Mild exacerbation likely triggered by rapid A-fib, suspecting underlying dehydration Received IV Lasix  in the ED with no urine output, but will hold off given normal chest x-ray and appears clinically dry Daily weights with intake and output monitoring Hold GDMT as BP might not tolerate 2D echo on 07/21/2022 showed EF > 55% with grade 2 diastolic dysfunction.   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)  No chest pain -Continue Lipitor    Type II diabetes mellitus with renal manifestations (HCC) -Sliding  scale insulin  -Glargine insulin  30 units daily  Dementia without behavioral disturbance (HCC) Continue home medications   -      DVT prophylaxis: Lovenox   Consults: cardiology chmg  Advance Care Planning:   Code Status: Prior   Family Communication: none  Disposition Plan: Back to previous home environment  Severity of Illness: The appropriate patient status for this patient is OBSERVATION. Observation status is judged to be reasonable and necessary in order to provide the required intensity of service to ensure the patient's safety. The patient's presenting symptoms, physical exam findings, and initial radiographic and laboratory data in the context of their medical condition is felt to place them at decreased risk for further clinical deterioration. Furthermore, it is anticipated that the patient will be medically stable for discharge from the hospital within 2 midnights of admission.    CRITICAL CARE Performed by: Lanetta Pion   Total critical care time: 100 minutes  Critical care time was exclusive of separately billable procedures and treating other patients.  Critical care was necessary to treat or prevent imminent or life-threatening deterioration.  Critical care was time spent personally by me on the following activities: development of treatment plan with patient and/or surrogate as well as nursing, discussions with consultants, evaluation of patient's response to treatment, examination of patient, obtaining history from patient or surrogate, ordering and performing treatments and interventions, ordering and review of laboratory studies, ordering and review of radiographic studies, pulse oximetry and re-evaluation of patient's condition.  Author: Lanetta Pion, MD 10/05/2023 11:24 PM  For on call review [email or web address].

## 2023-10-05 NOTE — ED Triage Notes (Addendum)
 Arrived by EMS from [facility].  They stated he was having trouble breathing and had RSV last week.  They put O2 on him and he pulled it off. Staff said they gave him a duo neb but pt states they did not. EMS states he was at 98 % on RA.  Pt states he was not having trouble breathing.  EMS stated they heard rails in his upper lungs but kept on the O2.  History of CHF and AFib  Temp 97.9 Pulse 150 110/73 243 BS RR 28 Cap 28

## 2023-10-06 ENCOUNTER — Encounter: Payer: Self-pay | Admitting: Internal Medicine

## 2023-10-06 ENCOUNTER — Observation Stay (HOSPITAL_COMMUNITY)
Admit: 2023-10-06 | Discharge: 2023-10-06 | Disposition: A | Discharge status: Home / Self Care | Attending: Cardiovascular Disease | Admitting: Cardiovascular Disease

## 2023-10-06 DIAGNOSIS — Z87442 Personal history of urinary calculi: Secondary | ICD-10-CM | POA: Diagnosis not present

## 2023-10-06 DIAGNOSIS — I5032 Chronic diastolic (congestive) heart failure: Secondary | ICD-10-CM

## 2023-10-06 DIAGNOSIS — Z8249 Family history of ischemic heart disease and other diseases of the circulatory system: Secondary | ICD-10-CM | POA: Diagnosis not present

## 2023-10-06 DIAGNOSIS — N1832 Chronic kidney disease, stage 3b: Secondary | ICD-10-CM

## 2023-10-06 DIAGNOSIS — I4891 Unspecified atrial fibrillation: Secondary | ICD-10-CM | POA: Diagnosis not present

## 2023-10-06 DIAGNOSIS — J45909 Unspecified asthma, uncomplicated: Secondary | ICD-10-CM | POA: Diagnosis present

## 2023-10-06 DIAGNOSIS — E1122 Type 2 diabetes mellitus with diabetic chronic kidney disease: Secondary | ICD-10-CM | POA: Diagnosis present

## 2023-10-06 DIAGNOSIS — Z79899 Other long term (current) drug therapy: Secondary | ICD-10-CM | POA: Diagnosis not present

## 2023-10-06 DIAGNOSIS — I25118 Atherosclerotic heart disease of native coronary artery with other forms of angina pectoris: Secondary | ICD-10-CM

## 2023-10-06 DIAGNOSIS — E785 Hyperlipidemia, unspecified: Secondary | ICD-10-CM | POA: Diagnosis present

## 2023-10-06 DIAGNOSIS — R0603 Acute respiratory distress: Secondary | ICD-10-CM | POA: Diagnosis not present

## 2023-10-06 DIAGNOSIS — Z66 Do not resuscitate: Secondary | ICD-10-CM | POA: Diagnosis present

## 2023-10-06 DIAGNOSIS — Z794 Long term (current) use of insulin: Secondary | ICD-10-CM | POA: Diagnosis not present

## 2023-10-06 DIAGNOSIS — Z91048 Other nonmedicinal substance allergy status: Secondary | ICD-10-CM | POA: Diagnosis not present

## 2023-10-06 DIAGNOSIS — Z87891 Personal history of nicotine dependence: Secondary | ICD-10-CM | POA: Diagnosis not present

## 2023-10-06 DIAGNOSIS — F0390 Unspecified dementia without behavioral disturbance: Secondary | ICD-10-CM | POA: Diagnosis present

## 2023-10-06 DIAGNOSIS — I2510 Atherosclerotic heart disease of native coronary artery without angina pectoris: Secondary | ICD-10-CM | POA: Diagnosis present

## 2023-10-06 DIAGNOSIS — I130 Hypertensive heart and chronic kidney disease with heart failure and stage 1 through stage 4 chronic kidney disease, or unspecified chronic kidney disease: Secondary | ICD-10-CM | POA: Diagnosis present

## 2023-10-06 DIAGNOSIS — I480 Paroxysmal atrial fibrillation: Secondary | ICD-10-CM | POA: Diagnosis present

## 2023-10-06 DIAGNOSIS — I959 Hypotension, unspecified: Secondary | ICD-10-CM

## 2023-10-06 DIAGNOSIS — Z882 Allergy status to sulfonamides status: Secondary | ICD-10-CM | POA: Diagnosis not present

## 2023-10-06 DIAGNOSIS — N179 Acute kidney failure, unspecified: Secondary | ICD-10-CM | POA: Diagnosis present

## 2023-10-06 DIAGNOSIS — Z951 Presence of aortocoronary bypass graft: Secondary | ICD-10-CM | POA: Diagnosis not present

## 2023-10-06 DIAGNOSIS — N400 Enlarged prostate without lower urinary tract symptoms: Secondary | ICD-10-CM | POA: Diagnosis present

## 2023-10-06 DIAGNOSIS — I4892 Unspecified atrial flutter: Secondary | ICD-10-CM | POA: Diagnosis present

## 2023-10-06 DIAGNOSIS — I509 Heart failure, unspecified: Secondary | ICD-10-CM | POA: Diagnosis present

## 2023-10-06 DIAGNOSIS — J9811 Atelectasis: Secondary | ICD-10-CM | POA: Diagnosis present

## 2023-10-06 DIAGNOSIS — I4510 Unspecified right bundle-branch block: Secondary | ICD-10-CM | POA: Diagnosis present

## 2023-10-06 DIAGNOSIS — Z7984 Long term (current) use of oral hypoglycemic drugs: Secondary | ICD-10-CM | POA: Diagnosis not present

## 2023-10-06 LAB — GLUCOSE, CAPILLARY
Glucose-Capillary: 122 mg/dL — ABNORMAL HIGH (ref 70–99)
Glucose-Capillary: 108 mg/dL — ABNORMAL HIGH (ref 70–99)
Glucose-Capillary: 154 mg/dL — ABNORMAL HIGH (ref 70–99)

## 2023-10-06 LAB — HEMOGLOBIN A1C
Hgb A1c MFr Bld: 6.8 % — ABNORMAL HIGH (ref 4.8–5.6)
Mean Plasma Glucose: 148.46 mg/dL

## 2023-10-06 LAB — CBC
HCT: 29.3 % — ABNORMAL LOW (ref 39.0–52.0)
Hemoglobin: 9.1 g/dL — ABNORMAL LOW (ref 13.0–17.0)
MCH: 25.9 pg — ABNORMAL LOW (ref 26.0–34.0)
MCHC: 31.1 g/dL (ref 30.0–36.0)
MCV: 83.2 fL (ref 80.0–100.0)
Platelets: 163 10*3/uL (ref 150–400)
RBC: 3.52 MIL/uL — ABNORMAL LOW (ref 4.22–5.81)
RDW: 15.5 % (ref 11.5–15.5)
WBC: 8.4 10*3/uL (ref 4.0–10.5)
nRBC: 0 % (ref 0.0–0.2)

## 2023-10-06 LAB — CBG MONITORING, ED
Glucose-Capillary: 108 mg/dL — ABNORMAL HIGH (ref 70–99)
Glucose-Capillary: 129 mg/dL — ABNORMAL HIGH (ref 70–99)
Glucose-Capillary: 138 mg/dL — ABNORMAL HIGH (ref 70–99)

## 2023-10-06 LAB — ECHOCARDIOGRAM COMPLETE
AR max vel: 1.22 cm2
AV Area VTI: 1.26 cm2
AV Area mean vel: 1.26 cm2
AV Mean grad: 9.5 mmHg
AV Peak grad: 18.6 mmHg
Ao pk vel: 2.16 m/s
Area-P 1/2: 4.06 cm2
Height: 66 in
MV VTI: 1.77 cm2
S' Lateral: 3.8 cm
Weight: 2659.63 [oz_av]

## 2023-10-06 LAB — BASIC METABOLIC PANEL WITH GFR
Anion gap: 8 (ref 5–15)
BUN: 74 mg/dL — ABNORMAL HIGH (ref 8–23)
CO2: 21 mmol/L — ABNORMAL LOW (ref 22–32)
Calcium: 8.2 mg/dL — ABNORMAL LOW (ref 8.9–10.3)
Chloride: 103 mmol/L (ref 98–111)
Creatinine, Ser: 3.09 mg/dL — ABNORMAL HIGH (ref 0.61–1.24)
GFR, Estimated: 19 mL/min — ABNORMAL LOW (ref >60)
Glucose, Bld: 136 mg/dL — ABNORMAL HIGH (ref 70–99)
Potassium: 4.6 mmol/L (ref 3.5–5.1)
Sodium: 132 mmol/L — ABNORMAL LOW (ref 135–145)

## 2023-10-06 MED ORDER — PERFLUTREN LIPID MICROSPHERE
1.0000 mL | INTRAVENOUS | Status: AC | PRN
  Administered 2023-10-06: 2 mL via INTRAVENOUS

## 2023-10-06 MED ORDER — FUROSEMIDE 10 MG/ML IJ SOLN: 20.0000 mg | Freq: Two times a day (BID) | INTRAMUSCULAR | Status: DC

## 2023-10-06 MED ORDER — ENSURE ENLIVE PO LIQD
237.0000 mL | Freq: Three times a day (TID) | ORAL | Status: DC
  Administered 2023-10-06 – 2023-10-07 (×3): 237 mL via ORAL

## 2023-10-06 MED ORDER — DIGOXIN 0.25 MG/ML IJ SOLN
0.2500 mg | INTRAMUSCULAR | Status: AC | PRN
Start: 2023-10-06 — End: 2023-10-06
  Administered 2023-10-06 (×2): 0.25 mg via INTRAVENOUS
  Filled 2023-10-06 (×3): qty 2

## 2023-10-06 MED ORDER — DILTIAZEM HCL 30 MG PO TABS
30.0000 mg | ORAL_TABLET | Freq: Four times a day (QID) | ORAL | Status: DC
  Administered 2023-10-06 – 2023-10-07 (×3): 30 mg via ORAL
  Filled 2023-10-06 (×3): qty 1

## 2023-10-06 MED ORDER — SODIUM CHLORIDE 0.9 % IV BOLUS
250.0000 mL | Freq: Once | INTRAVENOUS | Status: AC
  Administered 2023-10-06: 250 mL via INTRAVENOUS

## 2023-10-06 MED ORDER — DILTIAZEM LOAD VIA INFUSION
10.0000 mg | Freq: Once | INTRAVENOUS | Status: AC
  Administered 2023-10-06: 10 mg via INTRAVENOUS
  Filled 2023-10-06: qty 10

## 2023-10-06 MED ORDER — DILTIAZEM HCL-DEXTROSE 125-5 MG/125ML-% IV SOLN (PREMIX)
5.0000 mg/h | INTRAVENOUS | Status: DC
  Administered 2023-10-06: 5 mg/h via INTRAVENOUS
  Filled 2023-10-06: qty 125

## 2023-10-06 MED ORDER — METOPROLOL TARTRATE 25 MG PO TABS
12.5000 mg | ORAL_TABLET | Freq: Four times a day (QID) | ORAL | Status: DC
  Administered 2023-10-06 – 2023-10-07 (×3): 12.5 mg via ORAL
  Filled 2023-10-06 (×3): qty 1

## 2023-10-06 MED ORDER — SERTRALINE HCL 50 MG PO TABS
50.0000 mg | ORAL_TABLET | Freq: Every day | ORAL | Status: DC
  Administered 2023-10-06 – 2023-10-07 (×2): 50 mg via ORAL
  Filled 2023-10-06 (×2): qty 1

## 2023-10-06 MED ORDER — TAMSULOSIN HCL 0.4 MG PO CAPS
0.8000 mg | ORAL_CAPSULE | Freq: Every day | ORAL | Status: DC
  Administered 2023-10-06: 0.8 mg via ORAL
  Filled 2023-10-06: qty 2

## 2023-10-06 NOTE — Assessment & Plan Note (Signed)
 Continue home medications

## 2023-10-06 NOTE — Assessment & Plan Note (Signed)
 Creatinine 3 up from 2 a couple months prior Patient appears clinically dry Trial of IV fluid bolus Monitor renal function and avoid nephrotoxins

## 2023-10-06 NOTE — ED Notes (Signed)
 This RN at bedside. Pt spilt coffee on self and has soiled brief with small BM noted. Linens changed and new brief in place.

## 2023-10-06 NOTE — Assessment & Plan Note (Addendum)
 Continue rate control treatment Addendum:  -Following admission patient t with persistent RVR with rate in the 150s, with soft BP little improvement with IV digoxin . - Given chronic findings on chest x-ray, trial of IV fluid bolus with reassessment. - Hesitant to start amiodarone  at this time but will consider if unable to get rate under control -Cardiology consult in the a.m.    10/06/2023    2:30 AM 10/06/2023    1:50 AM 10/05/2023    7:49 PM  Vitals with BMI  Height   5\' 6"   Weight   166 lbs 4 oz  BMI   26.84  Systolic 94 94   Diastolic 71 75   Pulse 154 157

## 2023-10-06 NOTE — ED Notes (Signed)
 Echo at bedside

## 2023-10-06 NOTE — Progress Notes (Signed)
 PHARMACIST - PHYSICIAN COMMUNICATION  CONCERNING:  Enoxaparin  (Lovenox ) for DVT Prophylaxis    RECOMMENDATION: Patient was prescribed enoxaprin 40mg  q24 hours for VTE prophylaxis.   Filed Weights   10/05/23 1949  Weight: 75.4 kg (166 lb 3.6 oz)    Body mass index is 26.83 kg/m.  Estimated Creatinine Clearance: 16.8 mL/min (A) (by C-G formula based on SCr of 3.01 mg/dL (H)).  Patient is candidate for enoxaparin  30mg  every 24 hours based on CrCl <70ml/min or Weight <45kg  DESCRIPTION: Pharmacy has adjusted enoxaparin  dose per [facility] policy.  Patient is now receiving enoxaparin  30 mg every 24 hours   Coretta Dexter, PharmD, [facility] 10/06/2023 12:04 AM

## 2023-10-06 NOTE — ED Notes (Signed)
 Per MD. Stop the Dilt drip.

## 2023-10-06 NOTE — Assessment & Plan Note (Signed)
No chest pain -Continue Lipitor

## 2023-10-06 NOTE — Assessment & Plan Note (Signed)
-  Sliding scale insulin  -Glargine insulin  30 units daily

## 2023-10-06 NOTE — Progress Notes (Addendum)
 [facility] Hospitalized Patient Peter. Peter Robertson is a current hospice patient followed at [facility] for terminal diagnosis of Other Cerebrovascular Disease.  Hospice was not aware patient was sent to [facility] by facility.  Per EMS report, facility called EMS reporting patient had trouble breathing.  They placed him on O2 and gave him a duo neb treatment.  Patient was admitted to [facility] on 4.18.25 with diagnosis of Paroxysmal Atrial Fibrillation with RVR.  Per Dr. Garen Juneau, this is a related hospitalization. Peter Robertson lying on stretcher in ED awaiting a bed assignment on the floor.  Patient awake.  Verbalizes no complaints.  No family at bedside.  Spoke with admitting MD about discharge plan.  Original plan was discharge back to [facility] today- but Dr. Gollan, cardiologist wanted to keep him a night to see if they could get him more stabilized.   Patient is appropriate for GIP LOC requiring skilled care to monitor effectiveness of treatments with continuous monitoring. Vital Signs:  T 98.5 oral, BP 155/127, P 63, R 25  Oxi 98% on 2L O2/[place] I&O-  None reported Abnormal labs:  Na 132, C02 18, Glucose 193, BUN 70, Creat 3.01, Ca 8.4, Albumin  2.7, AST 11, Total Protein 6.3, GFR 20, RBC 3.30, HGB 8.5, HCT 27.4, MCH 25.8 Diagnostics:   DG Chest Port 1 View Result Date: 10/05/2023 CLINICAL DATA:  Difficulty breathing. EXAM: PORTABLE CHEST 1 VIEW COMPARISON:  July 18, 2023 FINDINGS: Multiple sternal wires and vascular clips are seen. The heart size and mediastinal contours are within normal limits. Stable areas of calcification are again seen throughout both lungs. Mild right basilar atelectasis and/or infiltrate is seen. A small right pleural effusion is suspected. No pneumothorax is identified. Multilevel degenerative changes are seen throughout the thoracic spine. IMPRESSION: 1. Evidence of prior median sternotomy/CABG. 2. 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 3. Mild  right basilar atelectasis and/or infiltrate with a small right pleural effusion. Electronically Signed   By: Virgle Grime M.D.   On: 10/05/2023 22:21  IV/PRN Meds: IV Lasix  40mg   x1 in ED Cardizem  125mg  in194mlg D5 @ 39ml/hour  Assessment and Plan:  per H&P Dr. Brion Cancel 4.18   Paroxysmal atrial fibrillation with RVR (HCC) Continue rate control treatment Addendum:  -Following admission patient t with persistent RVR with rate in the 150s, with soft BP little improvement with IV digoxin . - Given chronic findings on chest x-ray, trial of IV fluid bolus with reassessment. - Hesitant to start amiodarone  at this time but will consider if unable to get rate under control -Cardiology consult in the a.m.  2.  Hypotension Likely secondary to A-fib as well as Lasix  administered in the ED.   Not meeting sepsis criteria patient with rapid A-fib running soft blood pressures with decent MAP Trial of IV NS bolus in spite of diagnosis of CHF but chest x-ray is clear Considering midodrine  but awaiting improvement with bolus Hesitant to consider amiodarone  due to goals of care.  Patient is on hospice  3.  Acute renal failure superimposed on stage 3b chronic kidney disease (HCC) Creatinine 3 up from 2 a couple months prior Patient appears clinically dry Trial of IV fluid bolus Monitor renal function and avoid nephrotoxins   4.  Acute on chronic diastolic CHF (congestive heart failure) (HCC) Mild exacerbation likely triggered by rapid A-fib, suspecting underlying dehydration Received IV Lasix  in the ED with no urine output, but will hold off given normal chest x-ray and appears clinically dry Daily  weights with intake and output monitoring Hold GDMT as BP might not tolerate 2D echo on 07/21/2022 showed EF > 55% with grade 2 diastolic dysfunction.    5.   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)  No chest pain -Continue Lipitor    6.  Type II diabetes mellitus with renal  manifestations (HCC) -Sliding scale insulin  -Glargine insulin  30 units daily   6.  Dementia without behavioral disturbance (HCC) Continue home medications  Discharge Planning:  Ongoing- may discharge tomorrow back to [facility] Family contact:  Vern Going, neighbor- is PCG.  Agreeable to treatment plan. IDT:  Updated   Goals of Care- clear  Detailed Hospice Summary Report sent to [facility] for upload to Media tab. Ambrosio Junker, RN Nurse Liaison [phone]

## 2023-10-06 NOTE — TOC Initial Note (Addendum)
 Transition of Care [facility]) - Initial/Assessment Note    Patient Details  Name: Peter Robertson MRN: [id number] Date of Birth: [birth year]  Transition [facility]) CM/SW Contact:    [name] E [name], LCSW Phone Number: 10/06/2023, 11:44 AM  Clinical Narrative:                 Patient is from [facility] with [facility] Per MD, estimated ready for DC today or tomorrow - CSW reached out to [employer] at [facility], awaiting response. [name] Fam with [facility] aware.  12:55- [name] at [facility], The states patient can come back today or tomorrow if medically ready. Team updated.  1:05- Per MD, plan for DC back to [facility] tomorrow 4/20. Updated [name] in Admissions, she states for [facility] to call her tomorrow. Updated patient's POA [name].  Expected Discharge Plan: Skilled Nursing Facility Barriers to Discharge: Continued Medical Work up   Patient Goals and CMS Choice            Expected Discharge Plan and Services       Living arrangements for the past 2 months: Skilled Nursing Facility                                      Prior Living Arrangements/Services Living arrangements for the past 2 months: Skilled Nursing Facility Lives with:: Facility Resident                   Activities of Daily Living      Permission Sought/Granted                  Emotional Assessment              Admission diagnosis:  CHF (congestive heart failure) (HCC) [I50.9] Patient Active Problem List   Diagnosis Date Noted   Hypotension 10/06/2023   Hypokalemia 07/19/2023   Severe sepsis with septic shock (HCC) 07/18/2023   Myocardial injury 07/18/2023   Hyponatremia 07/18/2023   Hyperkalemia 07/18/2023   UTI (urinary tract infection) 06/27/2023   Type II diabetes mellitus with renal manifestations (HCC) 06/27/2023   Acute renal failure superimposed on stage 3b chronic kidney disease (HCC) 06/27/2023   Sarcoidosis 06/27/2023   Sacral  decubitus ulcer, stage III (HCC) 11/27/2022   Cellulitis of buttock 11/27/2022   Type 2 diabetes mellitus without complication, with long-term current use of insulin  (HCC) 11/27/2022   Protein-calorie malnutrition, severe 11/21/2022   Renal hematoma, left 11/19/2022   Chronic anticoagulation 11/18/2022   Infected decubitus ulcer 11/18/2022   Hydronephrosis of left kidney s/p nephrostomy tubes (4/2 - 10/03/2022) 11/18/2022   Frequent falls 11/18/2022   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 11/18/2022   Paroxysmal atrial fibrillation with RVR (HCC) 11/18/2022   Ureteropelvic junction (UPJ) obstruction 09/10/2022   (HFpEF) heart failure with preserved ejection fraction (HCC) 09/13/2021   Chronic diastolic (congestive) heart failure (HCC) 05/24/2021   Thyroid nodule greater than or equal to 1 cm in diameter incidentally noted on imaging study    Head injury    Weakness generalized    Acute on chronic diastolic CHF (congestive heart failure) (HCC) 05/07/2021   Acute respiratory failure with hypoxia (HCC) 05/07/2021   Aspiration pneumonia (HCC) 05/06/2021   Pneumonia due to COVID-19 virus 05/06/2021   Dementia without behavioral disturbance (HCC) 05/06/2021   Fall at home, initial encounter 05/06/2021  Hyperlipidemia    Hypertension    Elevated troponin    Chronic diastolic CHF (congestive heart failure) (HCC)    Depression    Stage 3a chronic kidney disease (HCC)    Thyroid nodule    Fall    Encounter for psychological evaluation 02/15/2020   AKI (acute kidney injury) (HCC) 02/12/2020   Vertigo 10/11/2019   BPPV (benign paroxysmal positional vertigo) 10/10/2019   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 [place] [address] Phone: [phone] Fax: [phone]     Social Drivers of Health (SDOH) Social History: SDOH Screenings   Food Insecurity: No Food Insecurity (07/19/2023)  Housing: Low Risk  (07/19/2023)  Transportation Needs: No Transportation Needs (07/19/2023)  Utilities: Not At Risk (07/19/2023)  Financial Resource Strain: Low Risk  (07/12/2022)   Received from [facility], [facility]  Physical Activity: Insufficiently Active (08/26/2020)   Received from [facility], [facility]  Social Connections: Moderately Isolated (07/19/2023)  Stress: Stress Concern Present (08/26/2020)   Received from [facility], [facility]  Tobacco Use: Medium Risk (10/05/2023)  Health Literacy: High Risk (08/19/2023)   Received from [facility]   SDOH Interventions:     Readmission Risk Interventions     No data to display

## 2023-10-06 NOTE — NC FL2 (Signed)
 [place]  MEDICAID FL2 LEVEL OF CARE FORM     IDENTIFICATION  Patient Name: Peter Robertson Birthdate: [birth year] Sex: male Admission Date (Current Location): 10/05/2023  [facility] and [place] Number:  Chiropodist and Address:  [facility], [address], [place], [place] [address]      Provider Number: [id number]  Attending Physician Name and Address:  Tiajuana Fluke, MD  Relative Name and Phone Number:  Vern [facility])  [phone] (Mobile)    Current Level of Care: Hospital Recommended Level of Care: Skilled Nursing Facility Prior Approval Number:    Date Approved/Denied:   PASRR Number: [id number]  Discharge Plan:      Current Diagnoses: Patient Active Problem List   Diagnosis Date Noted   Hypotension 10/06/2023   Hypokalemia 07/19/2023   Severe sepsis with septic shock (HCC) 07/18/2023   Myocardial injury 07/18/2023   Hyponatremia 07/18/2023   Hyperkalemia 07/18/2023   UTI (urinary tract infection) 06/27/2023   Type II diabetes mellitus with renal manifestations (HCC) 06/27/2023   Acute renal failure superimposed on stage 3b chronic kidney disease (HCC) 06/27/2023   Sarcoidosis 06/27/2023   Sacral decubitus ulcer, stage III (HCC) 11/27/2022   Cellulitis of buttock 11/27/2022   Type 2 diabetes mellitus without complication, with long-term current use of insulin  (HCC) 11/27/2022   Protein-calorie malnutrition, severe 11/21/2022   Renal hematoma, left 11/19/2022   Chronic anticoagulation 11/18/2022   Infected decubitus ulcer 11/18/2022   Hydronephrosis of left kidney s/p nephrostomy tubes (4/2 - 10/03/2022) 11/18/2022   Frequent falls 11/18/2022   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 11/18/2022   Paroxysmal atrial fibrillation with RVR (HCC) 11/18/2022   Ureteropelvic junction (UPJ) obstruction 09/10/2022   (HFpEF) heart failure with preserved ejection fraction (HCC) 09/13/2021    Chronic diastolic (congestive) heart failure (HCC) 05/24/2021   Thyroid nodule greater than or equal to 1 cm in diameter incidentally noted on imaging study    Head injury    Weakness generalized    Acute on chronic diastolic CHF (congestive heart failure) (HCC) 05/07/2021   Acute respiratory failure with hypoxia (HCC) 05/07/2021   Aspiration pneumonia (HCC) 05/06/2021   Pneumonia due to COVID-19 virus 05/06/2021   Dementia without behavioral disturbance (HCC) 05/06/2021   Fall at home, initial encounter 05/06/2021   Hyperlipidemia    Hypertension    Elevated troponin    Chronic diastolic CHF (congestive heart failure) (HCC)    Depression    Stage 3a chronic kidney disease (HCC)    Thyroid nodule    Fall    Encounter for psychological evaluation 02/15/2020   AKI (acute kidney injury) (HCC) 02/12/2020   Vertigo 10/11/2019   BPPV (benign paroxysmal positional vertigo) 10/10/2019   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) 10/10/2019   Uncontrolled type 2 diabetes mellitus with hyperglycemia, with long-term current use of insulin  (HCC) 10/10/2019   BPH (benign prostatic hyperplasia) 10/10/2019   Thrombocytopenia (HCC) 10/10/2019   Dark stools 10/10/2019   Unstable angina (HCC) 05/20/2018   Chest pain 01/30/2017    Orientation RESPIRATION BLADDER Height & Weight     Self, Place  O2 Incontinent Weight: 166 lb 3.6 oz (75.4 kg) Height:  5\' 6"  (167.6 cm)  BEHAVIORAL SYMPTOMS/MOOD NEUROLOGICAL BOWEL NUTRITION STATUS      Incontinent Diet (Diet regular Fluid consistency: Thin; Fluid restriction: 2000 mL Fluid)  AMBULATORY STATUS COMMUNICATION OF NEEDS Skin   Limited Assist Verbally Other (Comment) (pressure injury)  Personal Care Assistance Level of Assistance  Bathing, Feeding, Dressing Bathing Assistance: Limited assistance Feeding assistance: Limited assistance Dressing Assistance: Limited assistance     Functional Limitations Info              SPECIAL CARE FACTORS FREQUENCY                       Contractures      Additional Factors Info  Code Status, Allergies Code Status Info: Limited: Do not attempt resuscitation (DNR) -DNR-LIMITED -Do Not Intubate/DNI Allergies Info: Sulfamethoxazole-trimethoprim, Tape           Current Medications (10/06/2023):  This is the current hospital active medication list Current Facility-Administered Medications  Medication Dose Route Frequency Provider Last Rate Last Admin   acetaminophen  (TYLENOL ) tablet 650 mg  650 mg Oral Q6H PRN Duncan, Hazel V, MD       Or   acetaminophen  (TYLENOL ) suppository 650 mg  650 mg Rectal Q6H PRN Duncan, Hazel V, MD       albuterol  (PROVENTIL ) (2.5 MG/3ML) 0.083% nebulizer solution 2.5 mg  2.5 mg Nebulization Q2H PRN Duncan, Hazel V, MD   2.5 mg at 10/06/23 5784   atorvastatin  (LIPITOR) tablet 20 mg  20 mg Oral QHS Duncan, Hazel V, MD       diltiazem  (CARDIZEM ) 125 mg in dextrose  5% 125 mL (1 mg/mL) infusion  5-15 mg/hr Intravenous Continuous Margery Sheets B, MD   Stopped at 10/06/23 1215   enoxaparin  (LOVENOX ) injection 30 mg  30 mg Subcutaneous Q24H Duncan, Hazel V, MD   30 mg at 10/06/23 0908   feeding supplement (ENSURE ENLIVE / ENSURE PLUS) liquid 237 mL  237 mL Oral TID BM Sreenath, Sudheer B, MD       HYDROcodone -acetaminophen  (NORCO/VICODIN) 5-325 MG per tablet 1-2 tablet  1-2 tablet Oral Q4H PRN Duncan, Hazel V, MD       insulin  aspart (novoLOG ) injection 0-5 Units  0-5 Units Subcutaneous QHS Lanetta Pion, MD       insulin  aspart (novoLOG ) injection 0-9 Units  0-9 Units Subcutaneous TID WC Duncan, Hazel V, MD   1 Units at 10/06/23 1117   morphine  (PF) 2 MG/ML injection 2 mg  2 mg Intravenous Q2H PRN Duncan, Hazel V, MD       ondansetron  (ZOFRAN ) tablet 4 mg  4 mg Oral Q6H PRN Lanetta Pion, MD       Or   ondansetron  (ZOFRAN ) injection 4 mg  4 mg Intravenous Q6H PRN Duncan, Hazel V, MD       sertraline  (ZOLOFT ) tablet 50  mg  50 mg Oral Daily Sreenath, Sudheer B, MD   50 mg at 10/06/23 6962   tamsulosin  (FLOMAX ) capsule 0.8 mg  0.8 mg Oral QHS Tiajuana Fluke, MD       Current Outpatient Medications  Medication Sig Dispense Refill   ondansetron  (ZOFRAN ) 4 MG tablet Take 1 tablet (4 mg total) by mouth every 6 (six) hours as needed for nausea. 20 tablet 0   sertraline  (ZOLOFT ) 50 MG tablet Take 1 tablet by mouth daily.     tamsulosin  (FLOMAX ) 0.4 MG CAPS capsule Take 0.8 mg by mouth at bedtime.      acetaminophen  (TYLENOL ) 325 MG tablet Take 2 tablets (650 mg total) by mouth every 6 (six) hours as needed for mild pain or fever. (Patient not taking: Reported on 10/06/2023)     albuterol  (PROVENTIL ) (2.5 MG/3ML) 0.083% nebulizer solution Take 2.5  mg by nebulization every 4 (four) hours as needed. (Patient not taking: Reported on 10/06/2023)     alum & mag hydroxide-simeth (MAALOX/MYLANTA) 200-200-20 MG/5ML suspension Take 15 mLs by mouth every 6 (six) hours as needed for indigestion or heartburn. (Patient not taking: Reported on 10/06/2023) 355 mL 0   atorvastatin  (LIPITOR) 20 MG tablet Take 20 mg by mouth at bedtime. (Patient not taking: Reported on 10/06/2023)     cholecalciferol  (VITAMIN D3) 25 MCG (1000 UT) tablet Take 1,000 Units by mouth daily. (Patient not taking: Reported on 10/06/2023)     docusate sodium  (COLACE) 100 MG capsule Take 1 capsule (100 mg total) by mouth 2 (two) times daily as needed for mild constipation. (Patient not taking: Reported on 10/06/2023) 10 capsule 0   empagliflozin (JARDIANCE) 10 MG TABS tablet Take 10 mg by mouth daily. (Patient not taking: Reported on 10/06/2023)     feeding supplement (ENSURE ENLIVE / ENSURE PLUS) LIQD Take 237 mLs by mouth 3 (three) times daily between meals. 237 mL 12   gabapentin  (NEURONTIN ) 300 MG capsule Take 1 capsule (300 mg total) by mouth 3 (three) times daily. (Patient not taking: Reported on 10/06/2023)     insulin  glargine (LANTUS ) 100 UNIT/ML injection  Inject 0.3 mLs (30 Units total) into the skin daily. (Patient not taking: Reported on 10/06/2023) 10 mL 3   insulin  lispro (HUMALOG) 100 UNIT/ML KwikPen Inject 2-12 Units into the skin 3 times daily with meals, bedtime and 2 AM. Sliding scale insulin  < 60 = Call MD 200-249 = 2 units 250-299 = 4 units 300-349 = 6 units 350-359 = 8 units 400-449 = 10 units 450-499 = 12 units >500 = Call MD (Patient not taking: Reported on 10/06/2023)     midodrine  (PROAMATINE ) 10 MG tablet Take 1 tablet (10 mg total) by mouth 3 (three) times daily with meals. (Patient not taking: Reported on 10/06/2023)     nitroGLYCERIN  (NITROSTAT ) 0.4 MG SL tablet Place 0.4 mg under the tongue every 5 (five) minutes as needed for chest pain.  (Patient not taking: Reported on 10/06/2023)     Zinc  Oxide (BOUDREAUXS BUTT PASTE) 16 % OINT Apply 1 Application topically 3 (three) times daily as needed. (Patient not taking: Reported on 10/06/2023)       Discharge Medications: Please see discharge summary for a list of discharge medications.  Relevant Imaging Results:  Relevant Lab Results:   Additional Information SS#: [id number]  Torryn Hudspeth E Tassie Pollett, LCSW

## 2023-10-06 NOTE — ED Notes (Signed)
 Echocardiogram being done at bedside. Paused diltiazem  per echo tech request. Should only be a few minutes.

## 2023-10-06 NOTE — ED Notes (Signed)
 Admission Md messaged in regard to pt's HR

## 2023-10-06 NOTE — ED Notes (Signed)
 Pt remains tachy after PRN meds given and a round of fluids. MD aware of pt's status and care is ongoing. Safety maintained

## 2023-10-06 NOTE — Progress Notes (Signed)
*  PRELIMINARY RESULTS* Echocardiogram 2D Echocardiogram has been performed.  Peter Robertson C Peter Robertson 10/06/2023, 10:17 AM

## 2023-10-06 NOTE — Consult Note (Signed)
 Cardiology Consult    Patient ID: Peter Robertson MRN: [id number], DOB/AGE: [birth year]   Admit date: 10/05/2023 Date of Consult: 10/06/2023  Primary Physician: System, Provider Not In Primary Cardiologist: None - prev [facility] Requesting Provider: S. Mosetta Areola, MD  Patient Profile    MALEIK Robertson is a 83 y.o. 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), who is being seen today for the evaluation of Afib w/ RVR in the setting of RSV/resp failure at the request of Dr. Mosetta Areola.  Past Medical History  Subjective  Past Medical History:  Diagnosis Date   Asthma    BPH (benign prostatic hyperplasia)    Chronic heart failure with preserved ejection fraction (HFpEF) (HCC)    a. 07/2022 Echo: EF >55%, GrII DD, mod dil LA, nl RV size/fxn.   CKD (chronic kidney disease), stage III (HCC)    Coronary artery disease    a. 05/2010 s/p CABG x 2 (LIMA->LAD, VG->OM); b. 01/2017 Cath:  LM50d, LAD 100ost, patent prox stent, 32m/d, RI 80p, LCX mild diff dzs, RCA min irregs, LIMA->LAD patent, VG->OM 100-->Med rx.   Dementia (HCC)    Diabetes mellitus without complication (HCC)    Falls frequently    Heart murmur    Hyperlipidemia    Hypertension    IVH (intraventricular hemorrhage) (HCC)    a. s/p external ventricular drain - removed 06/2022.   Myocardial infarction (HCC)    Nephrolithiasis    PAF (paroxysmal atrial fibrillation) (HCC)    a. No OAC 2/2 falls.   Pulmonary sarcoidosis (HCC)    RBBB    Sepsis (HCC)    a. multiple admissions - urosepsis.    Past Surgical History:  Procedure Laterality Date   COLONOSCOPY WITH PROPOFOL  N/A 12/03/2014   Procedure: COLONOSCOPY WITH PROPOFOL ;  Surgeon: Cassie Click, MD;  Location: [facility] ENDOSCOPY;  Service: Endoscopy;   Laterality: N/A;   CORONARY ARTERY BYPASS GRAFT     LEFT HEART CATH AND CORS/GRAFTS ANGIOGRAPHY N/A 05/22/2018   Procedure: LEFT HEART CATH AND CORS/GRAFTS ANGIOGRAPHY poss PCI;  Surgeon: Devorah Fonder, MD;  Location: [facility] INVASIVE CV LAB;  Service: Cardiovascular;  Laterality: N/A;     Allergies  Allergies  Allergen Reactions   Sulfamethoxazole-Trimethoprim Other (See Comments)    dizziness dizziness dizziness    Tape Rash    blisters      History of Present Illness   83 y.o. 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).  He was previously followed @ [facility] and subsequently [facility].  Most recent cath in 2019, showed patent LIMA  LAD w/ occluded VG  OM.  He has 50% LM dzs and an occluded ostial LAD.  He has been medically managed.  Over the past year, he has had multiple admissions related to nephrolithiasis, hydronephrosis, UTI, and sepsis.  In 09/2022, he was noted to have Afib w/ RVR.  Discharge summaries indicate that he was managed w/ ? blocker therapy.  He was admitted in 06/2023 w/ UTI an fall, and eventually d/c to SNF.  He was readmitted in 07/2023 w/  weakness and UTI, again managed w/ ABX.  He wsa d/c to SNF on 07/24/2023.  Notes indicated that he was dx w/ RSV ~ 1 wk ago.  He was initially afebrile and hypertensive but subsequently developed Afib w/ RVR into the 150's w/ associated drop in BPs to the 90's to low 100's.  ECG showed Afib @ 140 w/ RBBB.  Labs notable for AKI w/ BUN/Creat of 70/3.01 (up from 61/2.18 on 07/24/2023).  H/H stable @ 8.5/27.4.  Plts stable @ 184.  WBC 8.7.  BNP 487.3.  HsTrop 12  13.  CXR showed extensive bilateral calcifications consistent w/ sarcoidosis and right basilar atx vs infiltrate w/ small R pl effusion.  He was  treated w/ lasix  40 IV x 1, followed by saline boluses.  IV digoxin  was administered x 2, started on diltiazem  infusion 5 mg/h (dosing limited secondary to hypotension) On rounds with thick cough, nonproductive, heart rate between 100 and 110 A-fib, reports feeling fine No lower extremity edema, denies chest pain  Inpatient Medications  Subjective    atorvastatin   20 mg Oral QHS   diltiazem   10 mg Intravenous Once   enoxaparin  (LOVENOX ) injection  30 mg Subcutaneous Q24H   feeding supplement  237 mL Oral TID BM   insulin  aspart  0-5 Units Subcutaneous QHS   insulin  aspart  0-9 Units Subcutaneous TID WC   sertraline   50 mg Oral Daily   tamsulosin   0.8 mg Oral QHS   Family History    Family History  Problem Relation Age of Onset   Diabetes Mother    Cancer Mother    Hypertension Father    Hypertension Brother    Diabetes Brother    He indicated that the status of his mother is unknown. He indicated that the status of his father is unknown. He indicated that the status of his brother is unknown.  Social History    Social History   Socioeconomic History   Marital status: Widowed    Spouse name: Not on file   Number of children: Not on file   Years of education: Not on file   Highest education level: Not on file  Occupational History   Occupation: retired  Tobacco Use   Smoking status: Former    Current packs/day: 0.00    Types: Cigarettes    Quit date: 06/19/1984    Years since quitting: 39.3   Smokeless tobacco: Never  Vaping Use   Vaping status: Never Used  Substance and Sexual Activity   Alcohol use: No   Drug use: No   Sexual activity: Not on file  Other Topics Concern   Not on file  Social History Narrative   Not on file   Social Drivers of Health   Financial Resource Strain: Low Risk  (07/12/2022)   Received from [facility], [facility]   Overall Financial Resource Strain (CARDIA)    Difficulty of Paying Living Expenses: Not very hard  Food  Insecurity: No Food Insecurity (07/19/2023)   Hunger Vital Sign    Worried About Running Out of Food in the Last Year: Never true    Ran Out of Food in the Last Year: Never true  Transportation Needs: No Transportation Needs (07/19/2023)   PRAPARE - Administrator, Civil Service (Medical): No    Lack of Transportation (Non-Medical): No  Physical Activity: Insufficiently Active (08/26/2020)   Received from [facility], [facility]   Exercise Vital Sign  Days of Exercise per Week: 5 days    Minutes of Exercise per Session: 10 min  Stress: Stress Concern Present (08/26/2020)   Received from [facility], [facility] of Occupational Health - Occupational Stress Questionnaire    Feeling of Stress : Rather much  Social Connections: Moderately Isolated (07/19/2023)   Social Connection and Isolation Panel [NHANES]    Frequency of Communication with Friends and Family: More than three times a week    Frequency of Social Gatherings with Friends and Family: More than three times a week    Attends Religious Services: 1 to 4 times per year    Active Member of [employer] or Organizations: No    Attends Banker Meetings: Never    Marital Status: Widowed  Intimate Partner Violence: Not At Risk (07/19/2023)   Humiliation, Afraid, Rape, and Kick questionnaire    Fear of Current or Ex-Partner: No    Emotionally Abused: No    Physically Abused: No    Sexually Abused: No     Review of Systems    General:  No chills, fever, night sweats or weight changes.  Cardiovascular:  No chest pain, dyspnea on exertion, edema, orthopnea, palpitations, paroxysmal nocturnal dyspnea. Dermatological: No rash, lesions/masses Respiratory: No cough, dyspnea Urologic: No hematuria, dysuria Abdominal:   No nausea, vomiting, diarrhea, bright red blood per rectum, melena, or hematemesis Neurologic:  No visual changes, wkns, changes in mental status. All other systems  reviewed and are otherwise negative except as noted above.    Objective  Physical Exam    Blood pressure 105/64, pulse (!) 153, temperature 98.9 F (37.2 C), temperature source Axillary, resp. rate (!) 21, height 5\' 6"  (1.676 m), weight 75.4 kg, SpO2 97%.  General: Pleasant, NAD Psych: Normal affect. Neuro: Alert and oriented X 3. Moves all extremities spontaneously. HEENT: Normal  Neck: Supple without bruits or JVD. Lungs:  Resp regular and unlabored, CTA. Heart: RRR no s3, s4, or murmurs. Abdomen: Soft, non-tender, non-distended, BS + x 4.  Extremities: No clubbing, cyanosis or edema. DP/PT2+, Radials 2+ and equal bilaterally.  Labs    Cardiac Enzymes Recent Labs  Lab 10/05/23 1936 10/05/23 2201  TROPONINIHS 12 13     BNP    Component Value Date/Time   BNP 487.3 (H) 10/05/2023 1936    ProBNP No results found for: "PROBNP"  Lab Results  Component Value Date   WBC 8.4 10/06/2023   HGB 9.1 (L) 10/06/2023   HCT 29.3 (L) 10/06/2023   MCV 83.2 10/06/2023   PLT 163 10/06/2023    Recent Labs  Lab 10/05/23 1936 10/06/23 0417  NA 132* 132*  K 4.9 4.6  CL 100 103  CO2 18* 21*  BUN 70* 74*  CREATININE 3.01* 3.09*  CALCIUM  8.4* 8.2*  PROT 6.3*  --   BILITOT 0.5  --   ALKPHOS 51  --   ALT 10  --   AST 11*  --   GLUCOSE 193* 136*   Lab Results  Component Value Date   CHOL 111 05/07/2021   HDL 30 (L) 05/07/2021   LDLCALC 69 05/07/2021   TRIG 59 05/07/2021   Lab Results  Component Value Date   DDIMER 1.32 (H) 05/06/2021   Radiology Studies    DG Chest Port 1 View Result Date: 10/05/2023 CLINICAL DATA:  Difficulty breathing. EXAM: PORTABLE CHEST 1 VIEW COMPARISON:  July 18, 2023 FINDINGS: Multiple sternal wires and vascular clips are seen. The  heart size and mediastinal contours are within normal limits. Stable areas of calcification are again seen throughout both lungs. Mild right basilar atelectasis and/or infiltrate is seen. A small right pleural  effusion is suspected. No pneumothorax is identified. Multilevel degenerative changes are seen throughout the thoracic spine. IMPRESSION: 1. Evidence of prior median sternotomy/CABG. 2. 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 3. Mild right basilar atelectasis and/or infiltrate with a small right pleural effusion. Electronically Signed   By: Virgle Grime M.D.   On: 10/05/2023 22:21      ECG & Cardiac Imaging    Afib, 140, rbbb - personally reviewed.  Assessment & Plan    Mr. Daleon Willinger is a 83 year old gentleman with past medical history of diastolic CHF, dementia, chronic kidney disease, diabetes type 2, paroxysmal atrial fibrillation presenting from [facility] with shortness of breath, atrial fibrillation with RVR rate 150 bpm  ---Atrial fibrillation with RVR Timing of onset unclear, notes indicating diagnosed RSV last week, coarse breath sounds, infiltrate right base of lung possibly contributing -Echocardiogram with normal LV function, no indication of elevated right heart pressures - Digoxin  IV x 2 for rate control - Started on IV diltiazem  infusion, dosing limited secondary to hypotension - Not a candidate for cardioversion as he is not on anticoagulation, - Ideally not a good candidate for amiodarone  given he is not on anticoagulation and risk of conversion and stroke - Will start diltiazem  30 mg every 6 hours in effort to wean down on the diltiazem  infusion  Chronic renal failure Longstanding history of diabetes, Creatinine 2 now running 3 Appears euvolemic on clinical exam and on echo  Respiratory distress, cough Notes indicating RSV last week, Infiltrate right base on chest x-ray, not productive - Respiratory status likely exacerbated in the setting of atrial fibrillation with RVR - No strong indication for Lasix  especially in light of hypotension  Chronic diastolic CHF Breathing likely exacerbated by atrial fibrillation with rapid rate Received  fluids and Lasix  in the ER Appears relatively euvolemic Respiratory status should improve with better rate control  Coronary artery disease with stable angina History of CABG 2011 Denies chest pain, troponin negative, no further ischemic workup  Dementia On hospice with multiple medical issues as detailed above   For questions or updates, please contact   Please consult [email or web address] for contact info under Cardiology/STEMI.

## 2023-10-06 NOTE — Assessment & Plan Note (Addendum)
 Likely secondary to A-fib as well as Lasix  administered in the ED.   Not meeting sepsis criteria patient with rapid A-fib running soft blood pressures with decent MAP Trial of IV NS bolus in spite of diagnosis of CHF but chest x-ray is clear Considering midodrine  but awaiting improvement with bolus Hesitant to consider amiodarone  due to goals of care.  Patient is on hospice

## 2023-10-06 NOTE — Progress Notes (Signed)
 PROGRESS NOTE    Peter Robertson  WJX:[id number] DOB: [birth year] DOA: 10/05/2023 PCP: System, Provider Not In    Brief Narrative:   83 y.o. male with medical history significant for hypertension, hyperlipidemia, diabetes mellitus, asthma, CAD, CKD-3A, BPH,  vertigo, dementia, dCHF, kidney stone, A-fib not on anticoagulants being admitted with acute CHF exacerbation after presenting with shortness of breath in the setting of an RSV infection the week prior.  O2 sats were 98% on room air but he was placed on O2 at 2 L for comfort. Chart reviewed.  Patient was admitted in February of this year with septic shock secondary to UTI and has had several prior hospitalizations. ED course and data review:[name]  Vitals within normal limits.  BP slightly elevated at 155/127-recheck ordered as it was normal at 110/73 with EMS. Labs notable for troponin 13 and BNP 48793 point Creat 3, up from baseline of 2 just a couple months prior with bicarb 18 Hemoglobin 8.5 which is baseline for him EKG, personally viewed and interpreted showing A-fib with RBBB at 141 chest x-ray showing chronic findings and a possible mild right basilar atelectasis and/or infiltrate with a small right pleural effusion. Patient given a dose of Lasix  IV and hospitalist consulted for admission.    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  He does not appear volume overloaded on exam.  Heart rate was 150,  did not respond to 2 doses of digoxin . Plan: Diltiazem  gtt. Oral diltiazem  after heart rate controlled Telemetry monitoring  Hypotension Blood pressure appears to be holding.  Should improve with rate control.  AKI on CKD stage IIIb Appears clinically dry.  Received fluids and Lasix .  Appears relatively euvolemic.  Chronic diastolic congestive heart failure Low suspicion for true fluid overload.  Patient appears clinically dry to euvolemic.  Repeat 2D echocardiogram.  CAD status post CABG 2011 No evidence of ACS  Type 2 diabetes mellitus with renal manifestations Sliding scale Long-acting  Advanced dementia Patient is active on hospice.  Anticipate discharge back to [facility] with hospice services 4/20 assuming we can achieve rate control    DVT prophylaxis: SQ lovenox  Code Status: DNR Family Communication:None Disposition Plan: Status is: Inpatient Remains inpatient appropriate because: Rapid afib on dilt gtt    Level of care: Progressive  Consultants:  Cardiology  Procedures:  None  Antimicrobials: None    Subjective: Seen and examined.  Mentating clearly.  Denies symptoms.  No complaints  Objective: Vitals:   10/06/23 1030 10/06/23 1045 10/06/23 1257 10/06/23 1300  BP: (!) 93/53  (!) 118/57 (!) 108/59  Pulse: (!) 52 (!) 52 (!) 55 (!) 55  Resp: (!) 25 (!) 23 (!) 27 (!) 22  Temp:      TempSrc:      SpO2: 100% 100% 100% 100%  Weight:      Height:        Intake/Output Summary (Last 24 hours) at 10/06/2023 1334 Last data filed at 10/06/2023  0981 Gross per 24 hour  Intake 250 ml  Output --  Net 250 ml   Filed Weights   10/05/23 1949  Weight: 75.4 kg    Examination:  General exam: Appears calm and comfortable  Respiratory system: Coarse breath sounds bilaterally.  Normal work of breathing.  2 L Cardiovascular system: S1-S2, tachycardic, irregular rhythm, no murmurs Gastrointestinal system: Thin, soft, NT/ND, normal bowel sounds Central  nervous system: Alert and oriented. No focal neurological deficits. Extremities: Symmetric 5 x 5 power. Skin: No rashes, lesions or ulcers Psychiatry: Judgement and insight appear normal. Mood & affect appropriate.     Data Reviewed: I have personally reviewed following labs and imaging studies  CBC: Recent Labs  Lab 10/05/23 1936 10/06/23 0417  WBC 8.7 8.4  NEUTROABS 6.8  --   HGB 8.5* 9.1*  HCT 27.4* 29.3*  MCV 83.0 83.2  PLT 184 163   Basic Metabolic Panel: Recent Labs  Lab 10/05/23 1936 10/06/23 0417  NA 132* 132*  K 4.9 4.6  CL 100 103  CO2 18* 21*  GLUCOSE 193* 136*  BUN 70* 74*  CREATININE 3.01* 3.09*  CALCIUM  8.4* 8.2*   GFR: Estimated Creatinine Clearance: 16.3 mL/min (A) (by C-G formula based on SCr of 3.09 mg/dL (H)). Liver Function Tests: Recent Labs  Lab 10/05/23 1936  AST 11*  ALT 10  ALKPHOS 51  BILITOT 0.5  PROT 6.3*  ALBUMIN  2.7*   No results for input(s): "LIPASE", "AMYLASE" in the last 168 hours. No results for input(s): "AMMONIA" in the last 168 hours. Coagulation Profile: No results for input(s): "INR", "PROTIME" in the last 168 hours. Cardiac Enzymes: No results for input(s): "CKTOTAL", "CKMB", "CKMBINDEX", "TROPONINI" in the last 168 hours. BNP (last 3 results) No results for input(s): "PROBNP" in the last 8760 hours. HbA1C: Recent Labs    10/06/23 0417  HGBA1C 6.8*   CBG: Recent Labs  Lab 10/06/23 0021 10/06/23 0729 10/06/23 1111  GLUCAP 108* 129* 138*   Lipid Profile: No results for input(s): "CHOL", "HDL", "LDLCALC", "TRIG", "CHOLHDL", "LDLDIRECT" in the last 72 hours. Thyroid Function Tests: No results for input(s): "TSH", "T4TOTAL", "FREET4", "T3FREE", "THYROIDAB" in the last 72 hours. Anemia Panel: No results for input(s): "VITAMINB12", "FOLATE", "FERRITIN", "TIBC", "IRON", "RETICCTPCT" in the last 72 hours. Sepsis Labs: No results for input(s): "PROCALCITON", "LATICACIDVEN" in the last 168 hours.  No results  found for this or any previous visit (from the past 240 hours).       Radiology Studies: ECHOCARDIOGRAM COMPLETE Result Date: 10/06/2023    ECHOCARDIOGRAM REPORT   Patient Name:   Peter Robertson Date of Exam: 10/06/2023 Medical Rec #:  [id number]     Height:       66.0 in Accession #:    [id number]    Weight:       166.2 lb Date of Birth:  29-Feb-1941     BSA:          1.849 m Patient Age:    82 years      BP:           105/64 mmHg Patient Gender: M             HR:           125 bpm. Exam Location:  [facility] Procedure: 2D Echo, Cardiac Doppler, Color Doppler and Intracardiac            Opacification Agent (Both Spectral and Color Flow Doppler were  utilized during procedure). Indications:     Atrial Fibrillation I48.91  History:         Patient has prior history of Echocardiogram examinations. CAD;                  Arrythmias:Atrial Fibrillation.  Sonographer:     Kathaleen Pale Roar Referring Phys:  [id number] Devorah Fonder Diagnosing Phys: Belva Boyden MD IMPRESSIONS  1. Left ventricular ejection fraction, by estimation, is 55 to 60%. The left ventricle has normal function. The left ventricle has no regional wall motion abnormalities. Left ventricular diastolic parameters are indeterminate.  2. Right ventricular systolic function is normal. The right ventricular size is normal. There is normal pulmonary artery systolic pressure. The estimated right ventricular systolic pressure is 25.2 mmHg.  3. The mitral valve is normal in structure. Mild mitral valve regurgitation. No evidence of mitral stenosis.  4. Tricuspid valve regurgitation is mild to moderate.  5. The aortic valve is normal in structure. There is mild calcification of the aortic valve. Aortic valve regurgitation is not visualized. Aortic valve sclerosis/calcification is present, without any evidence of aortic stenosis.  6. The inferior vena cava is normal in size with greater than 50% respiratory variability, suggesting right atrial pressure of 3 mmHg.  FINDINGS  Left Ventricle: Left ventricular ejection fraction, by estimation, is 55 to 60%. The left ventricle has normal function. The left ventricle has no regional wall motion abnormalities. Definity  contrast agent was given IV to delineate the left ventricular  endocardial borders. Strain was performed and the global longitudinal strain is indeterminate. The left ventricular internal cavity size was normal in size. There is no left ventricular hypertrophy. Left ventricular diastolic parameters are indeterminate. Right Ventricle: The right ventricular size is normal. No increase in right ventricular wall thickness. Right ventricular systolic function is normal. There is normal pulmonary artery systolic pressure. The tricuspid regurgitant velocity is 2.25 m/s, and  with an assumed right atrial pressure of 5 mmHg, the estimated right ventricular systolic pressure is 25.2 mmHg. Left Atrium: Left atrial size was normal in size. Right Atrium: Right atrial size was normal in size. Pericardium: There is no evidence of pericardial effusion. Mitral Valve: The mitral valve is normal in structure. Mild mitral valve regurgitation. No evidence of mitral valve stenosis. MV peak gradient, 4.8 mmHg. The mean mitral valve gradient is 2.0 mmHg. Tricuspid Valve: The tricuspid valve is normal in structure. Tricuspid valve regurgitation is mild to moderate. No evidence of tricuspid stenosis. Aortic Valve: The aortic valve is normal in structure. There is mild calcification of the aortic valve. Aortic valve regurgitation is not visualized. Aortic valve sclerosis/calcification is present, without any evidence of aortic stenosis. Aortic valve mean gradient measures 9.5 mmHg. Aortic valve peak gradient measures 18.6 mmHg. Aortic valve area, by VTI measures 1.26 cm. Pulmonic Valve: The pulmonic valve was normal in structure. Pulmonic valve regurgitation is mild. No evidence of pulmonic stenosis. Aorta: The aortic root is normal in size  and structure. Venous: The inferior vena cava is normal in size with greater than 50% respiratory variability, suggesting right atrial pressure of 3 mmHg. IAS/Shunts: No atrial level shunt detected by color flow Doppler. Additional Comments: 3D was performed not requiring image post processing on an independent workstation and was indeterminate.  LEFT VENTRICLE PLAX 2D LVIDd:         5.10 cm   Diastology LVIDs:         3.80 cm   LV e' medial:    12.20  cm/s LV PW:         1.10 cm   LV E/e' medial:  7.9 LV IVS:        1.30 cm   LV e' lateral:   12.20 cm/s LVOT diam:     1.90 cm   LV E/e' lateral: 7.9 LV SV:         46 LV SV Index:   25 LVOT Area:     2.84 cm  RIGHT VENTRICLE RV Basal diam:  3.00 cm RV Mid diam:    2.40 cm RV S prime:     12.90 cm/s TAPSE (M-mode): 1.8 cm LEFT ATRIUM           Index        RIGHT ATRIUM           Index LA diam:      3.80 cm 2.06 cm/m   RA Area:     19.10 cm LA Vol (A4C): 48.2 ml 26.07 ml/m  RA Volume:   49.80 ml  26.94 ml/m  AORTIC VALVE                     PULMONIC VALVE AV Area (Vmax):    1.22 cm      PV Vmax:          1.68 m/s AV Area (Vmean):   1.26 cm      PV Peak grad:     11.3 mmHg AV Area (VTI):     1.26 cm      PR End Diast Vel: 6.76 msec AV Vmax:           215.50 cm/s   RVOT Peak grad:   2 mmHg AV Vmean:          143.000 cm/s AV VTI:            0.364 m AV Peak Grad:      18.6 mmHg AV Mean Grad:      9.5 mmHg LVOT Vmax:         92.60 cm/s LVOT Vmean:        63.600 cm/s LVOT VTI:          0.161 m LVOT/AV VTI ratio: 0.44  AORTA Ao Root diam: 2.90 cm Ao Asc diam:  3.00 cm MITRAL VALVE               TRICUSPID VALVE MV Area (PHT): 4.06 cm    TR Peak grad:   20.2 mmHg MV Area VTI:   1.77 cm    TR Vmax:        225.00 cm/s MV Peak grad:  4.8 mmHg MV Mean grad:  2.0 mmHg    SHUNTS MV Vmax:       1.10 m/s    Systemic VTI:  0.16 m MV Vmean:      66.9 cm/s   Systemic Diam: 1.90 cm MV Decel Time: 187 msec MV E velocity: 96.40 cm/s Belva Boyden MD Electronically signed by Belva Boyden MD Signature Date/Time: 10/06/2023/12:29:20 PM    Final    DG Chest Port 1 View Result Date: 10/05/2023 CLINICAL DATA:  Difficulty breathing. EXAM: PORTABLE CHEST 1 VIEW COMPARISON:  July 18, 2023 FINDINGS: Multiple sternal wires and vascular clips are seen. The heart size and mediastinal contours are within normal limits. Stable areas of calcification are again seen throughout both lungs. Mild right basilar atelectasis and/or infiltrate is seen. A small right pleural effusion is suspected. No pneumothorax is  identified. Multilevel degenerative changes are seen throughout the thoracic spine. IMPRESSION: 1. Evidence of prior median sternotomy/CABG. 2. 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 3. Mild right basilar atelectasis and/or infiltrate with a small right pleural effusion. Electronically Signed   By: Virgle Grime M.D.   On: 10/05/2023 22:21        Scheduled Meds:  atorvastatin   20 mg Oral QHS   diltiazem   30 mg Oral Q6H   enoxaparin  (LOVENOX ) injection  30 mg Subcutaneous Q24H   feeding supplement  237 mL Oral TID BM   insulin  aspart  0-5 Units Subcutaneous QHS   insulin  aspart  0-9 Units Subcutaneous TID WC   metoprolol  tartrate  12.5 mg Oral Q6H   sertraline   50 mg Oral Daily   tamsulosin   0.8 mg Oral QHS   Continuous Infusions:  diltiazem  (CARDIZEM ) infusion Stopped (10/06/23 1215)     LOS: 0 days    Tiajuana Fluke, MD [facility]   If 7PM-7AM, please contact night-coverage  10/06/2023, 1:34 PM

## 2023-10-06 NOTE — Assessment & Plan Note (Deleted)
-  Avoid renal toxic medications Monitor renal function in the setting of of diuretic treatment

## 2023-10-06 NOTE — Assessment & Plan Note (Addendum)
 Mild exacerbation likely triggered by rapid A-fib, suspecting underlying dehydration Received IV Lasix  in the ED with no urine output, but will hold off given normal chest x-ray and appears clinically dry Daily weights with intake and output monitoring Hold GDMT as BP might not tolerate 2D echo on 07/21/2022 showed EF > 55% with grade 2 diastolic dysfunction.

## 2023-10-07 DIAGNOSIS — I4891 Unspecified atrial fibrillation: Secondary | ICD-10-CM | POA: Diagnosis not present

## 2023-10-07 LAB — CBC WITH DIFFERENTIAL/PLATELET
Abs Immature Granulocytes: 0.04 10*3/uL (ref 0.00–0.07)
Basophils Absolute: 0 10*3/uL (ref 0.0–0.1)
Basophils Relative: 0 %
Eosinophils Absolute: 0.2 10*3/uL (ref 0.0–0.5)
Eosinophils Relative: 3 %
HCT: 26.7 % — ABNORMAL LOW (ref 39.0–52.0)
Hemoglobin: 8.6 g/dL — ABNORMAL LOW (ref 13.0–17.0)
Immature Granulocytes: 0 %
Lymphocytes Relative: 6 %
Lymphs Abs: 0.5 10*3/uL — ABNORMAL LOW (ref 0.7–4.0)
MCH: 25.4 pg — ABNORMAL LOW (ref 26.0–34.0)
MCHC: 32.2 g/dL (ref 30.0–36.0)
MCV: 78.8 fL — ABNORMAL LOW (ref 80.0–100.0)
Monocytes Absolute: 0.7 10*3/uL (ref 0.1–1.0)
Monocytes Relative: 8 %
Neutro Abs: 7.8 10*3/uL — ABNORMAL HIGH (ref 1.7–7.7)
Neutrophils Relative %: 83 %
Platelets: 158 10*3/uL (ref 150–400)
RBC: 3.39 MIL/uL — ABNORMAL LOW (ref 4.22–5.81)
RDW: 15.6 % — ABNORMAL HIGH (ref 11.5–15.5)
WBC: 9.3 10*3/uL (ref 4.0–10.5)
nRBC: 0 % (ref 0.0–0.2)

## 2023-10-07 LAB — BASIC METABOLIC PANEL WITH GFR
Anion gap: 9 (ref 5–15)
BUN: 77 mg/dL — ABNORMAL HIGH (ref 8–23)
CO2: 19 mmol/L — ABNORMAL LOW (ref 22–32)
Calcium: 8.4 mg/dL — ABNORMAL LOW (ref 8.9–10.3)
Chloride: 103 mmol/L (ref 98–111)
Creatinine, Ser: 3 mg/dL — ABNORMAL HIGH (ref 0.61–1.24)
GFR, Estimated: 20 mL/min — ABNORMAL LOW (ref >60)
Glucose, Bld: 142 mg/dL — ABNORMAL HIGH (ref 70–99)
Potassium: 4.8 mmol/L (ref 3.5–5.1)
Sodium: 131 mmol/L — ABNORMAL LOW (ref 135–145)

## 2023-10-07 LAB — GLUCOSE, CAPILLARY: Glucose-Capillary: 188 mg/dL — ABNORMAL HIGH (ref 70–99)

## 2023-10-07 MED ORDER — AMIODARONE HCL 200 MG PO TABS
200.0000 mg | ORAL_TABLET | Freq: Two times a day (BID) | ORAL | Status: DC
  Administered 2023-10-07: 200 mg via ORAL
  Filled 2023-10-07: qty 1

## 2023-10-07 MED ORDER — METOPROLOL TARTRATE 25 MG PO TABS: 25.0000 mg | ORAL_TABLET | Freq: Two times a day (BID) | ORAL | Status: DC

## 2023-10-07 MED ORDER — METOPROLOL TARTRATE 25 MG PO TABS
25.0000 mg | ORAL_TABLET | Freq: Two times a day (BID) | ORAL | Status: DC
  Administered 2023-10-07: 25 mg via ORAL
  Filled 2023-10-07: qty 1

## 2023-10-07 MED ORDER — AMIODARONE HCL 200 MG PO TABS: 200.0000 mg | ORAL_TABLET | Freq: Two times a day (BID) | ORAL | Status: DC

## 2023-10-07 MED ORDER — DILTIAZEM HCL 30 MG PO TABS
30.0000 mg | ORAL_TABLET | Freq: Two times a day (BID) | ORAL | Status: DC
  Administered 2023-10-07: 30 mg via ORAL
  Filled 2023-10-07: qty 1

## 2023-10-07 MED ORDER — DILTIAZEM HCL 30 MG PO TABS: 30.0000 mg | ORAL_TABLET | Freq: Two times a day (BID) | ORAL | Status: DC

## 2023-10-07 NOTE — Discharge Summary (Signed)
 Physician Discharge Summary  Peter Robertson ZOX:[id number] DOB: 1941/05/02 DOA: 10/05/2023  PCP: System, Provider Not In  Admit date: 10/05/2023 Discharge date: 10/07/2023  Admitted From: WOM Disposition:  WOM with hospice  Recommendations for Outpatient Follow-up:  Per outpatient providers   Home Health:NA  Equipment/Devices:None   Discharge Condition:Hospice  CODE STATUS:DNR  Diet recommendation: Regular/comfort  Brief/Interim Summary:    83 y.o. male with medical history significant for hypertension, hyperlipidemia, diabetes mellitus, asthma, CAD, CKD-3A, BPH,  vertigo, dementia, dCHF, kidney stone, A-fib not on anticoagulants being admitted with acute CHF exacerbation after presenting with shortness of breath in the setting of an RSV infection the week prior.  O2 sats were 98% on room air but he was placed on O2 at 2 L for comfort. Chart reviewed.  Patient was admitted in February of this year with septic shock secondary to UTI and has had several prior hospitalizations. ED course and data review:Peter Robertson  Vitals within normal limits.  BP slightly elevated at 155/127-recheck ordered as it was normal at 110/73 with EMS. Labs notable for troponin 13 and BNP 48793 point Creat 3, up from baseline of 2 just a couple months prior with bicarb 18 Hemoglobin 8.5 which is baseline for him EKG, personally viewed and interpreted showing A-fib with RBBB at 141 chest x-ray showing chronic findings and a possible mild right basilar atelectasis and/or infiltrate with a small right pleural effusion. Patient given a dose of Lasix  IV and hospitalist consulted for admission.      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  He does not appear volume overloaded on exam.  Heart rate was 150, did not respond to 2 doses of digoxin .  Improved at time of DC.  Patient denies symptoms Plan: DC back to [facility] with hospice services DNR (form on chart) Amiodarone  200 BID Metoprolol  tartrate 25 BID Cardizem  30 BID      Discharge Instructions  Discharge Instructions     Diet - low sodium heart healthy   Complete by: As directed    Increase activity slowly   Complete by: As directed       Allergies as of 10/07/2023       Reactions   Sulfamethoxazole-trimethoprim Other (See Comments)   dizziness dizziness dizziness   Tape Rash   blisters        Medication List     STOP taking these medications    acetaminophen  325 MG tablet Commonly known as: TYLENOL    atorvastatin  20 MG tablet Commonly known as: LIPITOR   Boudreauxs Butt Paste 16 % Oint Generic drug: Zinc  Oxide   cholecalciferol  25 MCG (1000 UNIT) tablet Commonly known as: VITAMIN D3   docusate sodium  100 MG capsule Commonly known as: COLACE   empagliflozin 10 MG Tabs tablet Commonly known as: JARDIANCE   gabapentin  300 MG capsule Commonly known as: NEURONTIN    insulin  glargine 100 UNIT/ML injection Commonly known as:  LANTUS    insulin  lispro 100 UNIT/ML KwikPen Commonly known as: HUMALOG   midodrine  10 MG tablet Commonly known as: PROAMATINE    nitroGLYCERIN  0.4 MG SL tablet Commonly known as: NITROSTAT    ondansetron  4 MG tablet Commonly known as: ZOFRAN        TAKE these medications    albuterol  (2.5 MG/3ML) 0.083% nebulizer solution Commonly known as: PROVENTIL  Take 2.5 mg by nebulization every 4 (four)  hours as needed.   alum & mag hydroxide-simeth 200-200-20 MG/5ML suspension Commonly known as: MAALOX/MYLANTA Take 15 mLs by mouth every 6 (six) hours as needed for indigestion or heartburn.   amiodarone  200 MG tablet Commonly known as: PACERONE  Take 1 tablet (200 mg total) by mouth 2 (two) times daily.   diltiazem  30 MG tablet Commonly known as: CARDIZEM  Take 1 tablet (30 mg total) by mouth 2 (two) times daily.   feeding supplement Liqd Take 237 mLs by mouth 3 (three) times daily between meals.   metoprolol  tartrate 25 MG tablet Commonly known as: LOPRESSOR  Take 1 tablet (25 mg total) by mouth 2 (two) times daily.   sertraline  50 MG tablet Commonly known as: ZOLOFT  Take 1 tablet by mouth daily.   tamsulosin  0.4 MG Caps capsule Commonly known as: FLOMAX  Take 0.8 mg by mouth at bedtime.        Allergies  Allergen Reactions   Sulfamethoxazole-Trimethoprim Other (See Comments)    dizziness dizziness dizziness    Tape Rash    blisters    Consultations: Cardiology   Procedures/Studies: ECHOCARDIOGRAM COMPLETE Result Date: 10/06/2023    ECHOCARDIOGRAM REPORT   Patient Name:   Peter Robertson Date of Exam: 10/06/2023 Medical Rec #:  [id number]     Height:       66.0 in Accession #:    [id number]    Weight:       166.2 lb Date of Birth:  [birth year]     BSA:          1.849 m Patient Age:    82 years      BP:           105/64 mmHg Patient Gender: M             HR:           125 bpm. Exam Location:  [facility] Procedure: 2D Echo, Cardiac Doppler, Color Doppler and Intracardiac            Opacification Agent (Both Spectral and Color Flow Doppler were            utilized during procedure). Indications:     Atrial Fibrillation I48.91  History:         Patient has prior history of Echocardiogram examinations. CAD;                  Arrythmias:Atrial Fibrillation.  Sonographer:     Kathaleen Pale Roar Referring Phys:  [id number] Peter Robertson Diagnosing Phys: Peter Boyden MD IMPRESSIONS  1. Left  ventricular ejection fraction, by estimation, is 55 to 60%. The left ventricle has normal function. The left ventricle has no regional wall motion abnormalities. Left ventricular diastolic parameters are indeterminate.  2. Right ventricular systolic function is normal. The right ventricular size is normal. There is normal pulmonary artery systolic pressure. The estimated right ventricular systolic pressure is 25.2 mmHg.  3. The mitral valve is normal in structure. Mild mitral valve regurgitation. No evidence of mitral stenosis.  4. Tricuspid valve regurgitation is mild to moderate.  5. The aortic valve is normal in structure. There is mild calcification of the aortic valve. Aortic valve regurgitation is not visualized. Aortic valve sclerosis/calcification is present, without any evidence of aortic stenosis.  6. The inferior vena cava is normal in size with greater than 50% respiratory variability, suggesting right atrial pressure of 3 mmHg. FINDINGS  Left Ventricle: Left ventricular ejection fraction, by estimation, is 55 to 60%. The left ventricle has normal function. The left ventricle has no regional wall motion abnormalities. Definity  contrast agent was given IV to delineate the left ventricular  endocardial borders. Strain was performed and the global longitudinal strain is indeterminate. The left ventricular internal cavity size was normal in size. There is no left ventricular hypertrophy. Left ventricular diastolic parameters are indeterminate. Right Ventricle: The right ventricular size is normal. No increase in right ventricular wall thickness. Right ventricular systolic function is normal. There is normal pulmonary artery systolic pressure. The tricuspid regurgitant velocity is 2.25 m/s, and  with an assumed right atrial pressure of 5 mmHg, the estimated right ventricular systolic pressure is 25.2 mmHg. Left Atrium: Left atrial size was normal in size. Right Atrium: Right atrial size was normal in size.  Pericardium: There is no evidence of pericardial effusion. Mitral Valve: The mitral valve is normal in structure. Mild mitral valve regurgitation. No evidence of mitral valve stenosis. MV peak gradient, 4.8 mmHg. The mean mitral valve gradient is 2.0 mmHg. Tricuspid Valve: The tricuspid valve is normal in structure. Tricuspid valve regurgitation is mild to moderate. No evidence of tricuspid stenosis. Aortic Valve: The aortic valve is normal in structure. There is mild calcification of the aortic valve. Aortic valve regurgitation is not visualized. Aortic valve sclerosis/calcification is present, without any evidence of aortic stenosis. Aortic valve mean gradient measures 9.5 mmHg. Aortic valve peak gradient measures 18.6 mmHg. Aortic valve area, by VTI measures 1.26 cm. Pulmonic Valve: The pulmonic valve was normal in structure. Pulmonic valve regurgitation is mild. No evidence of pulmonic stenosis. Aorta: The aortic root is normal in size and structure. Venous: The inferior vena cava is normal in size with greater than 50% respiratory variability, suggesting right atrial pressure of 3 mmHg. IAS/Shunts: No atrial level shunt detected by color flow Doppler. Additional Comments: 3D was performed not requiring image post processing on an independent workstation and was indeterminate.  LEFT VENTRICLE PLAX 2D LVIDd:         5.10 cm   Diastology LVIDs:         3.80 cm   LV e' medial:    12.20 cm/s LV PW:         1.10 cm   LV E/e' medial:  7.9 LV IVS:        1.30 cm   LV e' lateral:   12.20 cm/s LVOT diam:     1.90 cm   LV E/e' lateral: 7.9 LV SV:         46 LV SV Index:   25 LVOT Area:     2.84 cm  RIGHT VENTRICLE RV Basal diam:  3.00 cm RV Mid diam:    2.40 cm RV S prime:     12.90 cm/s TAPSE (M-mode): 1.8 cm LEFT ATRIUM           Index        RIGHT ATRIUM           Index LA diam:      3.80 cm 2.06 cm/m   RA Area:  19.10 cm LA Vol (A4C): 48.2 ml 26.07 ml/m  RA Volume:   49.80 ml  26.94 ml/m  AORTIC VALVE                      PULMONIC VALVE AV Area (Vmax):    1.22 cm      PV Vmax:          1.68 m/s AV Area (Vmean):   1.26 cm      PV Peak grad:     11.3 mmHg AV Area (VTI):     1.26 cm      PR End Diast Vel: 6.76 msec AV Vmax:           215.50 cm/s   RVOT Peak grad:   2 mmHg AV Vmean:          143.000 cm/s AV VTI:            0.364 m AV Peak Grad:      18.6 mmHg AV Mean Grad:      9.5 mmHg LVOT Vmax:         92.60 cm/s LVOT Vmean:        63.600 cm/s LVOT VTI:          0.161 m LVOT/AV VTI ratio: 0.44  AORTA Ao Root diam: 2.90 cm Ao Asc diam:  3.00 cm MITRAL VALVE               TRICUSPID VALVE MV Area (PHT): 4.06 cm    TR Peak grad:   20.2 mmHg MV Area VTI:   1.77 cm    TR Vmax:        225.00 cm/s MV Peak grad:  4.8 mmHg MV Mean grad:  2.0 mmHg    SHUNTS MV Vmax:       1.10 m/s    Systemic VTI:  0.16 m MV Vmean:      66.9 cm/s   Systemic Diam: 1.90 cm MV Decel Time: 187 msec MV E velocity: 96.40 cm/s Peter Boyden MD Electronically signed by Peter Boyden MD Signature Date/Time: 10/06/2023/12:29:20 PM    Final    DG Chest Port 1 View Result Date: 10/05/2023 CLINICAL DATA:  Difficulty breathing. EXAM: PORTABLE CHEST 1 VIEW COMPARISON:  July 18, 2023 FINDINGS: Multiple sternal wires and vascular clips are seen. The heart size and mediastinal contours are within normal limits. Stable areas of calcification are again seen throughout both lungs. Mild right basilar atelectasis and/or infiltrate is seen. A small right pleural effusion is suspected. No pneumothorax is identified. Multilevel degenerative changes are seen throughout the thoracic spine. IMPRESSION: 1. Evidence of prior median sternotomy/CABG. 2. 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 3. Mild right basilar atelectasis and/or infiltrate with a small right pleural effusion. Electronically Signed   By: Virgle Grime M.D.   On: 10/05/2023 22:21      Subjective: Seen and examined on day of discharge.  Stable, appropriate for return to  SNF with hospice.  Discharge Exam: Vitals:   10/07/23 0402 10/07/23 0759  BP: 109/65 107/60  Pulse: 80 80  Resp: 19   Temp: 97.9 F (36.6 C) 97.6 F (36.4 C)  SpO2: 100% 100%   Vitals:   10/06/23 2319 10/07/23 0402 10/07/23 0542 10/07/23 0759  BP: 128/72 109/65  107/60  Pulse: 84 80  80  Resp:  19    Temp: 98.3 F (36.8 C) 97.9 F (36.6 C)  97.6 F (36.4 C)  TempSrc: Oral Oral  SpO2: 100% 100%  100%  Weight:   68.8 kg   Height:        General: Pt is alert, awake, not in acute distress Cardiovascular: RRR, S1/S2 +, no rubs, no gallops Respiratory: CTA bilaterally, no wheezing, no rhonchi Abdominal: Soft, NT, ND, bowel sounds + Extremities: no edema, no cyanosis    The results of significant diagnostics from this hospitalization (including imaging, microbiology, ancillary and laboratory) are listed below for reference.     Microbiology: No results found for this or any previous visit (from the past 240 hours).   Labs: BNP (last 3 results) Recent Labs    06/27/23 1508 07/18/23 1549 10/05/23 1936  BNP 124.4* 56.5 487.3*   Basic Metabolic Panel: Recent Labs  Lab 10/05/23 1936 10/06/23 0417 10/07/23 0815  NA 132* 132* 131*  K 4.9 4.6 4.8  CL 100 103 103  CO2 18* 21* 19*  GLUCOSE 193* 136* 142*  BUN 70* 74* 77*  CREATININE 3.01* 3.09* 3.00*  CALCIUM  8.4* 8.2* 8.4*   Liver Function Tests: Recent Labs  Lab 10/05/23 1936  AST 11*  ALT 10  ALKPHOS 51  BILITOT 0.5  PROT 6.3*  ALBUMIN  2.7*   No results for input(s): "LIPASE", "AMYLASE" in the last 168 hours. No results for input(s): "AMMONIA" in the last 168 hours. CBC: Recent Labs  Lab 10/05/23 1936 10/06/23 0417 10/07/23 0815  WBC 8.7 8.4 9.3  NEUTROABS 6.8  --  7.8*  HGB 8.5* 9.1* 8.6*  HCT 27.4* 29.3* 26.7*  MCV 83.0 83.2 78.8*  PLT 184 163 158   Cardiac Enzymes: No results for input(s): "CKTOTAL", "CKMB", "CKMBINDEX", "TROPONINI" in the last 168 hours. BNP: Invalid input(s):  "POCBNP" CBG: Recent Labs  Lab 10/06/23 0729 10/06/23 1111 10/06/23 1608 10/06/23 1645 10/06/23 2110  GLUCAP 129* 138* 122* 108* 154*   D-Dimer No results for input(s): "DDIMER" in the last 72 hours. Hgb A1c Recent Labs    10/06/23 0417  HGBA1C 6.8*   Lipid Profile No results for input(s): "CHOL", "HDL", "LDLCALC", "TRIG", "CHOLHDL", "LDLDIRECT" in the last 72 hours. Thyroid function studies No results for input(s): "TSH", "T4TOTAL", "T3FREE", "THYROIDAB" in the last 72 hours.  Invalid input(s): "FREET3" Anemia work up No results for input(s): "VITAMINB12", "FOLATE", "FERRITIN", "TIBC", "IRON", "RETICCTPCT" in the last 72 hours. Urinalysis    Component Value Date/Time   COLORURINE YELLOW (A) 07/20/2023 1026   APPEARANCEUR TURBID (A) 07/20/2023 1026   LABSPEC 1.011 07/20/2023 1026   PHURINE 5.0 07/20/2023 1026   GLUCOSEU 150 (A) 07/20/2023 1026   HGBUR LARGE (A) 07/20/2023 1026   BILIRUBINUR NEGATIVE 07/20/2023 1026   KETONESUR NEGATIVE 07/20/2023 1026   PROTEINUR 100 (A) 07/20/2023 1026   NITRITE NEGATIVE 07/20/2023 1026   LEUKOCYTESUR MODERATE (A) 07/20/2023 1026   Sepsis Labs Recent Labs  Lab 10/05/23 1936 10/06/23 0417 10/07/23 0815  WBC 8.7 8.4 9.3   Microbiology No results found for this or any previous visit (from the past 240 hours).   Time coordinating discharge: Over 30 minutes  SIGNED:   Tiajuana Fluke, MD  [facility] 10/07/2023, 9:47 AM Pager   If 7PM-7AM, please contact night-coverage

## 2023-10-07 NOTE — Plan of Care (Signed)
  Problem: Education: Goal: Ability to describe self-care measures that may prevent or decrease complications (Diabetes Survival Skills Education) will improve Outcome: Progressing   Problem: Coping: Goal: Ability to adjust to condition or change in health will improve Outcome: Progressing   Problem: Fluid Volume: Goal: Ability to maintain a balanced intake and output will improve Outcome: Progressing   Problem: Metabolic: Goal: Ability to maintain appropriate glucose levels will improve Outcome: Progressing   Problem: Skin Integrity: Goal: Risk for impaired skin integrity will decrease Outcome: Progressing   Problem: Clinical Measurements: Goal: Cardiovascular complication will be avoided Outcome: Progressing   Problem: Activity: Goal: Risk for activity intolerance will decrease Outcome: Progressing   Problem: Nutrition: Goal: Adequate nutrition will be maintained Outcome: Progressing

## 2023-10-07 NOTE — Progress Notes (Signed)
 Rounding Note    Patient Name: Peter Robertson Date of Encounter: 10/07/2023  [facility] Cardiologist: [facility]  Subjective   Mitts in place this morning, he is requesting removal so he can eat his breakfast Denies significant chest pain or shortness of breath Residual loose cough Telemetry reviewed showing paroxysmal atrial fibrillation/flutter  Inpatient Medications    Scheduled Meds:  amiodarone   200 mg Oral BID   atorvastatin   20 mg Oral QHS   diltiazem   30 mg Oral BID   enoxaparin  (LOVENOX ) injection  30 mg Subcutaneous Q24H   feeding supplement  237 mL Oral TID BM   insulin  aspart  0-5 Units Subcutaneous QHS   insulin  aspart  0-9 Units Subcutaneous TID WC   metoprolol  tartrate  25 mg Oral BID   sertraline   50 mg Oral Daily   tamsulosin   0.8 mg Oral QHS   Continuous Infusions:  PRN Meds: acetaminophen  **OR** acetaminophen , albuterol , HYDROcodone -acetaminophen , morphine  injection, ondansetron  **OR** ondansetron  (ZOFRAN ) IV   Vital Signs    Vitals:   10/06/23 2319 10/07/23 0402 10/07/23 0542 10/07/23 0759  BP: 128/72 109/65  107/60  Pulse: 84 80  80  Resp:  19    Temp: 98.3 F (36.8 C) 97.9 F (36.6 C)  97.6 F (36.4 C)  TempSrc: Oral Oral    SpO2: 100% 100%  100%  Weight:   68.8 kg   Height:        Intake/Output Summary (Last 24 hours) at 10/07/2023 1140 Last data filed at 10/07/2023 0600 Gross per 24 hour  Intake 515.85 ml  Output 1150 ml  Net -634.15 ml      10/07/2023    5:42 AM 10/06/2023    4:00 PM 10/05/2023    7:49 PM  Last 3 Weights  Weight (lbs) 151 lb 10.8 oz 152 lb 12.5 oz 166 lb 3.6 oz  Weight (kg) 68.8 kg 69.3 kg 75.4 kg      Telemetry    Paroxysmal atrial fibrillation/flutter, rate 80s- Personally Reviewed  ECG     - Personally Reviewed  Physical Exam   GEN: No acute distress.   Neck: No JVD Cardiac: RRR, no murmurs, rubs, or gallops.  Respiratory: Clear to auscultation bilaterally , scattered  Rales GI: Soft, nontender, non-distended  MS: No edema; No deformity. Neuro:  Nonfocal  Psych: Normal affect   Labs    High Sensitivity Troponin:   Recent Labs  Lab 10/05/23 1936 10/05/23 2201  TROPONINIHS 12 13     Chemistry Recent Labs  Lab 10/05/23 1936 10/06/23 0417 10/07/23 0815  NA 132* 132* 131*  K 4.9 4.6 4.8  CL 100 103 103  CO2 18* 21* 19*  GLUCOSE 193* 136* 142*  BUN 70* 74* 77*  CREATININE 3.01* 3.09* 3.00*  CALCIUM  8.4* 8.2* 8.4*  PROT 6.3*  --   --   ALBUMIN  2.7*  --   --   AST 11*  --   --   ALT 10  --   --   ALKPHOS 51  --   --   BILITOT 0.5  --   --   GFRNONAA 20* 19* 20*  ANIONGAP 14 8 9     Lipids No results for input(s): "CHOL", "TRIG", "HDL", "LABVLDL", "LDLCALC", "CHOLHDL" in the last 168 hours.  Hematology Recent Labs  Lab 10/05/23 1936 10/06/23 0417 10/07/23 0815  WBC 8.7 8.4 9.3  RBC 3.30* 3.52* 3.39*  HGB 8.5* 9.1* 8.6*  HCT 27.4* 29.3* 26.7*  MCV  83.0 83.2 78.8*  MCH 25.8* 25.9* 25.4*  MCHC 31.0 31.1 32.2  RDW 15.5 15.5 15.6*  PLT 184 163 158   Thyroid No results for input(s): "TSH", "FREET4" in the last 168 hours.  BNP Recent Labs  Lab 10/05/23 1936  BNP 487.3*    DDimer No results for input(s): "DDIMER" in the last 168 hours.   Radiology    ECHOCARDIOGRAM COMPLETE Result Date: 10/06/2023    ECHOCARDIOGRAM REPORT   Patient Name:   ELIAS DENNINGTON Date of Exam: 10/06/2023 Medical Rec #:  [id number]     Height:       66.0 in Accession #:    [id number]    Weight:       166.2 lb Date of Birth:  04-11-1941     BSA:          1.849 m Patient Age:    82 years      BP:           105/64 mmHg Patient Gender: M             HR:           125 bpm. Exam Location:  [facility] Procedure: 2D Echo, Cardiac Doppler, Color Doppler and Intracardiac            Opacification Agent (Both Spectral and Color Flow Doppler were            utilized during procedure). Indications:     Atrial Fibrillation I48.91  History:         Patient has prior history of  Echocardiogram examinations. CAD;                  Arrythmias:Atrial Fibrillation.  Sonographer:     Kathaleen Pale Roar Referring Phys:  [id number] Devorah Fonder Diagnosing Phys: Belva Boyden MD IMPRESSIONS  1. Left ventricular ejection fraction, by estimation, is 55 to 60%. The left ventricle has normal function. The left ventricle has no regional wall motion abnormalities. Left ventricular diastolic parameters are indeterminate.  2. Right ventricular systolic function is normal. The right ventricular size is normal. There is normal pulmonary artery systolic pressure. The estimated right ventricular systolic pressure is 25.2 mmHg.  3. The mitral valve is normal in structure. Mild mitral valve regurgitation. No evidence of mitral stenosis.  4. Tricuspid valve regurgitation is mild to moderate.  5. The aortic valve is normal in structure. There is mild calcification of the aortic valve. Aortic valve regurgitation is not visualized. Aortic valve sclerosis/calcification is present, without any evidence of aortic stenosis.  6. The inferior vena cava is normal in size with greater than 50% respiratory variability, suggesting right atrial pressure of 3 mmHg. FINDINGS  Left Ventricle: Left ventricular ejection fraction, by estimation, is 55 to 60%. The left ventricle has normal function. The left ventricle has no regional wall motion abnormalities. Definity  contrast agent was given IV to delineate the left ventricular  endocardial borders. Strain was performed and the global longitudinal strain is indeterminate. The left ventricular internal cavity size was normal in size. There is no left ventricular hypertrophy. Left ventricular diastolic parameters are indeterminate. Right Ventricle: The right ventricular size is normal. No increase in right ventricular wall thickness. Right ventricular systolic function is normal. There is normal pulmonary artery systolic pressure. The tricuspid regurgitant velocity is 2.25 m/s, and  with an  assumed right atrial pressure of 5 mmHg, the estimated right ventricular systolic pressure is 25.2 mmHg. Left Atrium: Left atrial size was normal  in size. Right Atrium: Right atrial size was normal in size. Pericardium: There is no evidence of pericardial effusion. Mitral Valve: The mitral valve is normal in structure. Mild mitral valve regurgitation. No evidence of mitral valve stenosis. MV peak gradient, 4.8 mmHg. The mean mitral valve gradient is 2.0 mmHg. Tricuspid Valve: The tricuspid valve is normal in structure. Tricuspid valve regurgitation is mild to moderate. No evidence of tricuspid stenosis. Aortic Valve: The aortic valve is normal in structure. There is mild calcification of the aortic valve. Aortic valve regurgitation is not visualized. Aortic valve sclerosis/calcification is present, without any evidence of aortic stenosis. Aortic valve mean gradient measures 9.5 mmHg. Aortic valve peak gradient measures 18.6 mmHg. Aortic valve area, by VTI measures 1.26 cm. Pulmonic Valve: The pulmonic valve was normal in structure. Pulmonic valve regurgitation is mild. No evidence of pulmonic stenosis. Aorta: The aortic root is normal in size and structure. Venous: The inferior vena cava is normal in size with greater than 50% respiratory variability, suggesting right atrial pressure of 3 mmHg. IAS/Shunts: No atrial level shunt detected by color flow Doppler. Additional Comments: 3D was performed not requiring image post processing on an independent workstation and was indeterminate.  LEFT VENTRICLE PLAX 2D LVIDd:         5.10 cm   Diastology LVIDs:         3.80 cm   LV e' medial:    12.20 cm/s LV PW:         1.10 cm   LV E/e' medial:  7.9 LV IVS:        1.30 cm   LV e' lateral:   12.20 cm/s LVOT diam:     1.90 cm   LV E/e' lateral: 7.9 LV SV:         46 LV SV Index:   25 LVOT Area:     2.84 cm  RIGHT VENTRICLE RV Basal diam:  3.00 cm RV Mid diam:    2.40 cm RV S prime:     12.90 cm/s TAPSE (M-mode): 1.8 cm LEFT  ATRIUM           Index        RIGHT ATRIUM           Index LA diam:      3.80 cm 2.06 cm/m   RA Area:     19.10 cm LA Vol (A4C): 48.2 ml 26.07 ml/m  RA Volume:   49.80 ml  26.94 ml/m  AORTIC VALVE                     PULMONIC VALVE AV Area (Vmax):    1.22 cm      PV Vmax:          1.68 m/s AV Area (Vmean):   1.26 cm      PV Peak grad:     11.3 mmHg AV Area (VTI):     1.26 cm      PR End Diast Vel: 6.76 msec AV Vmax:           215.50 cm/s   RVOT Peak grad:   2 mmHg AV Vmean:          143.000 cm/s AV VTI:            0.364 m AV Peak Grad:      18.6 mmHg AV Mean Grad:      9.5 mmHg LVOT Vmax:         92.60  cm/s LVOT Vmean:        63.600 cm/s LVOT VTI:          0.161 m LVOT/AV VTI ratio: 0.44  AORTA Ao Root diam: 2.90 cm Ao Asc diam:  3.00 cm MITRAL VALVE               TRICUSPID VALVE MV Area (PHT): 4.06 cm    TR Peak grad:   20.2 mmHg MV Area VTI:   1.77 cm    TR Vmax:        225.00 cm/s MV Peak grad:  4.8 mmHg MV Mean grad:  2.0 mmHg    SHUNTS MV Vmax:       1.10 m/s    Systemic VTI:  0.16 m MV Vmean:      66.9 cm/s   Systemic Diam: 1.90 cm MV Decel Time: 187 msec MV E velocity: 96.40 cm/s Belva Boyden MD Electronically signed by Belva Boyden MD Signature Date/Time: 10/06/2023/12:29:20 PM    Final    DG Chest Port 1 View Result Date: 10/05/2023 CLINICAL DATA:  Difficulty breathing. EXAM: PORTABLE CHEST 1 VIEW COMPARISON:  July 18, 2023 FINDINGS: Multiple sternal wires and vascular clips are seen. The heart size and mediastinal contours are within normal limits. Stable areas of calcification are again seen throughout both lungs. Mild right basilar atelectasis and/or infiltrate is seen. A small right pleural effusion is suspected. No pneumothorax is identified. Multilevel degenerative changes are seen throughout the thoracic spine. IMPRESSION: 1. Evidence of prior median sternotomy/CABG. 2. 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 3. Mild right basilar atelectasis and/or  infiltrate with a small right pleural effusion. Electronically Signed   By: Virgle Grime M.D.   On: 10/05/2023 22:21    Cardiac Studies  Echo   1. Left ventricular ejection fraction, by estimation, is 55 to 60%. The  left ventricle has normal function. The left ventricle has no regional  wall motion abnormalities. Left ventricular diastolic parameters are  indeterminate.   2. Right ventricular systolic function is normal. The right ventricular  size is normal. There is normal pulmonary artery systolic pressure. The  estimated right ventricular systolic pressure is 25.2 mmHg.   3. The mitral valve is normal in structure. Mild mitral valve  regurgitation. No evidence of mitral stenosis.   4. Tricuspid valve regurgitation is mild to moderate.   5. The aortic valve is normal in structure. There is mild calcification  of the aortic valve. Aortic valve regurgitation is not visualized. Aortic  valve sclerosis/calcification is present, without any evidence of aortic  stenosis.   6. The inferior vena cava is normal in size with greater than 50%  respiratory variability, suggesting right atrial pressure of 3 mmHg.     Patient Profile     LENNELL SHANKS is a 83 y.o. 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), who is being seen today for the evaluation of Afib w/ RVR in the setting of RSV/resp failure   Assessment & Plan    --Atrial fibrillation with RVR Timing of onset unclear, notes indicating diagnosed RSV last week, coarse breath sounds, infiltrate right base of lung possibly contributing -Echocardiogram with normal LV function, no indication of elevated right heart pressures -Presenting with A-fib with rate 150 bpm - In the  ER, for rate control  given digoxin  IV x 2 for rate control started on diltiazem  infusion -Diltiazem  infusion held by nursing staff for hypotension - Yesterday afternoon started on metoprolol  tartrate 12.5 every 6, diltiazem  30 every 6 -Paroxysmal atrial fibrillation/flutter through the course of yesterday evening overnight, and this morning -Will start amiodarone  200 twice daily 2 weeks then down to 200 daily, diltiazem  30 twice daily, metoprolol  tartrate 25 twice daily -Not a candidate for anticoagulation given anemia, prior history of falls, intraventricular bleed   Chronic renal failure Longstanding history of diabetes, Creatinine 2 now running 3 -Appears euvolemic on exam   Respiratory distress, cough Notes indicating RSV last week, Infiltrate right base on chest x-ray, not productive - Respiratory status likely exacerbated in the setting of atrial fibrillation with RVR - Will hold off on Lasix  given acute on chronic renal failure   Chronic diastolic CHF Breathing likely exacerbated by atrial fibrillation with rapid rate Received fluids and Lasix  in the ER -Likely resolving viral bronchitis   Coronary artery disease with stable angina History of CABG 2011 Denies chest pain, troponin negative, no further ischemic workup   Dementia On hospice with multiple medical issues as detailed above  For questions or updates, please contact [facility] Please consult [email or web address] for contact info under        Signed, Shant Hence, MD  10/07/2023, 11:40 AM

## 2023-10-07 NOTE — TOC Transition Note (Signed)
 Transition of Care [facility]) - Discharge Note   Patient Details  Name: Peter Robertson MRN: [id number] Date of Birth: March 17, 1941  [facility] CM/SW Contact:  Alexandra Ice, RN Phone Number: 10/07/2023, 10:20 AM   Clinical Narrative:     Patient to discharge today, return to [facility]. Spoke with [name]  at [employer], arranged transportation, estimated pick up time 6pm. MD and bedside nurse notified. Provided number to call report, patient going to [facility]. EMS packet placed by patient's chart on nurse's station.  Final next level of care: Skilled Nursing Facility Barriers to Discharge: Barriers Resolved   Patient Goals and CMS Choice Patient states their goals for this hospitalization and ongoing recovery are:: Return to SNF CMS Medicare.gov Compare Post Acute Care list provided to:: Patient Represenative (must comment) Choice offered to / list presented to : [facility] POA / Guardian      Discharge Placement                Patient to be transferred to facility by: [facility]   Patient and family notified of of transfer: 10/07/23  Discharge Plan and Services Additional resources added to the After Visit Summary for                                       Social Drivers of Health (SDOH) Interventions SDOH Screenings   Food Insecurity: No Food Insecurity (10/06/2023)  Housing: Low Risk  (10/06/2023)  Transportation Needs: No Transportation Needs (10/06/2023)  Utilities: Not At Risk (10/06/2023)  Financial Resource Strain: Low Risk  (07/12/2022)   Received from [facility], [facility]  Physical Activity: Insufficiently Active (08/26/2020)   Received from [facility], [facility]  Social Connections: Moderately Isolated (10/06/2023)  Stress: Stress Concern Present (08/26/2020)   Received from [facility], [facility]  Tobacco Use: Medium Risk (10/05/2023)  Health Literacy: High Risk (08/19/2023)   Received from [facility]      Readmission Risk Interventions     No data to display

## 2023-11-27 ENCOUNTER — Other Ambulatory Visit: Payer: Self-pay

## 2023-11-27 ENCOUNTER — Emergency Department

## 2023-11-27 ENCOUNTER — Emergency Department
Admission: EM | Admit: 2023-11-27 | Discharge: 2023-11-27 | Disposition: A | Discharge status: Skilled Nursing Facility | Attending: Emergency Medicine | Admitting: Emergency Medicine

## 2023-11-27 DIAGNOSIS — S51812A Laceration without foreign body of left forearm, initial encounter: Secondary | ICD-10-CM | POA: Diagnosis not present

## 2023-11-27 DIAGNOSIS — S59912A Unspecified injury of left forearm, initial encounter: Secondary | ICD-10-CM | POA: Diagnosis present

## 2023-11-27 DIAGNOSIS — W01198A Fall on same level from slipping, tripping and stumbling with subsequent striking against other object, initial encounter: Secondary | ICD-10-CM | POA: Insufficient documentation

## 2023-11-27 DIAGNOSIS — R519 Headache, unspecified: Secondary | ICD-10-CM | POA: Insufficient documentation

## 2023-11-27 DIAGNOSIS — W19XXXA Unspecified fall, initial encounter: Secondary | ICD-10-CM

## 2023-11-27 LAB — BASIC METABOLIC PANEL WITH GFR
Anion gap: 4 — ABNORMAL LOW (ref 5–15)
BUN: 49 mg/dL — ABNORMAL HIGH (ref 8–23)
CO2: 25 mmol/L (ref 22–32)
Calcium: 8.5 mg/dL — ABNORMAL LOW (ref 8.9–10.3)
Chloride: 109 mmol/L (ref 98–111)
Creatinine, Ser: 2.8 mg/dL — ABNORMAL HIGH (ref 0.61–1.24)
GFR, Estimated: 22 mL/min — ABNORMAL LOW (ref >60)
Glucose, Bld: 279 mg/dL — ABNORMAL HIGH (ref 70–99)
Potassium: 5.2 mmol/L — ABNORMAL HIGH (ref 3.5–5.1)
Sodium: 138 mmol/L (ref 135–145)

## 2023-11-27 LAB — CBC
HCT: 26.6 % — ABNORMAL LOW (ref 39.0–52.0)
Hemoglobin: 8.6 g/dL — ABNORMAL LOW (ref 13.0–17.0)
MCH: 26.6 pg (ref 26.0–34.0)
MCHC: 32.3 g/dL (ref 30.0–36.0)
MCV: 82.4 fL (ref 80.0–100.0)
Platelets: 109 10*3/uL — ABNORMAL LOW (ref 150–400)
RBC: 3.23 MIL/uL — ABNORMAL LOW (ref 4.22–5.81)
RDW: 18.9 % — ABNORMAL HIGH (ref 11.5–15.5)
WBC: 6.4 10*3/uL (ref 4.0–10.5)
nRBC: 0 % (ref 0.0–0.2)

## 2023-11-27 NOTE — ED Triage Notes (Signed)
 First Nurse Note: Patient to ED via [facility] from [facility] for a fall. Pt was going up a ramp and fell back hitting his head on the concrete. Hematoma to the back of head. Skin tear to left forearm. Pt is a hospice patient. No LOC or blood thinners. Pupils uneven but is baseline for patient.   138/57 64 HR 100% RA 345 cbg

## 2023-11-27 NOTE — ED Notes (Signed)
 Report called to dee at [facility].  [facility] here to transport pt.

## 2023-11-27 NOTE — ED Notes (Signed)
 Pt has skin tear to left forearm/elbow area.  Wound cleaned and bandaged with nonadhesive and gauze

## 2023-11-27 NOTE — Progress Notes (Signed)
 [facility] Liaison note  This patient is a current [facility] patient.  [facility] will follow through discharge disposition  Please call with any Hospice related questions or concerns.  [facility] Liaison [phone]

## 2023-11-27 NOTE — ED Notes (Signed)
 Call [facility] spoke with [facility] to transport patient to [facility] /said it would be around 10:00 pm

## 2023-11-27 NOTE — ED Provider Notes (Signed)
 [facility] Provider Note    Event Date/Time   First MD Initiated Contact with Patient 11/27/23 1814     (approximate)   History   Fall   HPI  Peter Robertson is a 83 y.o. male with multiple co-morbidities on hospice who presents for evaluation after a fall. Patient was in his wheel chair and tipped backwards hitting his head on the concrete and cutting his arm. No LOC or blood thinner use. Reports pain to his head and left forearm.       Physical Exam   Triage Vital Signs: ED Triage Vitals [11/27/23 1535]  Encounter Vitals Group     BP 115/81     Systolic BP Percentile      Diastolic BP Percentile      Pulse Rate 64     Resp 17     Temp 97.9 F (36.6 C)     Temp Source Oral     SpO2 100 %     Weight 151 lb 10.8 oz (68.8 kg)     Height 5\' 6"  (1.676 m)     Head Circumference      Peak Flow      Pain Score 10     Pain Loc      Pain Education      Exclude from Growth Chart     Most recent vital signs: Vitals:   11/27/23 1535  BP: 115/81  Pulse: 64  Resp: 17  Temp: 97.9 F (36.6 C)  SpO2: 100%   General: Awake, no distress.  CV:  Good peripheral perfusion. RRR. Resp:  Normal effort. CTAB. Abd:  No distention.  Other:  Skin tear to left forearm with surrounding bruising. NO focal neuro deficits, left pupil is enlarged and dilated, does not constrict to light but this patient's baseline. No pronator drift.   ED Results / Procedures / Treatments   Labs (all labs ordered are listed, but only abnormal results are displayed) Labs Reviewed  CBC - Abnormal; Notable for the following components:      Result Value   RBC 3.23 (*)    Hemoglobin 8.6 (*)    HCT 26.6 (*)    RDW 18.9 (*)    Platelets 109 (*)    All other components within normal limits  BASIC METABOLIC PANEL WITH GFR - Abnormal; Notable for the following components:   Potassium 5.2 (*)    Glucose, Bld 279 (*)    BUN 49 (*)    Creatinine, Ser 2.80 (*)    Calcium  8.5  (*)    GFR, Estimated 22 (*)    Anion gap 4 (*)    All other components within normal limits   RADIOLOGY  CT head obtained, I interpreted the images as well as reviewed the radiologist report which was negative for any acute abnormalities.    PROCEDURES:  Critical Care performed: No  Procedures   MEDICATIONS ORDERED IN ED: Medications - No data to display   IMPRESSION / MDM / ASSESSMENT AND PLAN / ED COURSE  I reviewed the triage vital signs and the nursing notes.                             83 year old male presents for evaluation after a fall. VSS and patient NAD on exam.   Differential diagnosis includes, but is not limited to, abrasion, contusion, skin tear, skull fracture, intracranial bleed, closed head  injury.  Patient's presentation is most consistent with acute complicated illness / injury requiring diagnostic workup.  CBC and BMP at patient's baseline. Ct head negative for acute abnormalities. No focal neuro deficits.   Patient's pain is well controlled without needing pain medication. Discussed taking tylenol  as needed at home. Skin tear was cleaned and re-dressed by nursing staff. Given negative imaging feel patient is stable for outpatient management. Patient voiced understanding, all questions were answered and he was stable at discharge.      FINAL CLINICAL IMPRESSION(S) / ED DIAGNOSES   Final diagnoses:  Fall, initial encounter  Skin tear of left forearm without complication, initial encounter     Rx / DC Orders   ED Discharge Orders     None        Note:  This document was prepared using Dragon voice recognition software and may include unintentional dictation errors.   Phyliss Breen, PA-C 11/27/23 Glorious Larry    Claria Crofts, MD 11/27/23 [phone]

## 2023-11-27 NOTE — ED Triage Notes (Signed)
 Pt here via [facility] from [facility] c/o a fall. Pt states the wheelchair he was in went backwards and he hit his head on the concrete. Pt denies blood thinner and LOC. Pt c/o head and left arm pain.

## 2023-11-27 NOTE — Discharge Instructions (Signed)
 The blood work was at your baseline. Ct head was normal.   Take tylenol  as needed for pain. Clean the wound on your arm with soap and water daily and cover with a bandage.

## 2024-01-30 NOTE — Progress Notes (Signed)
 Abstraction Result Flowsheet Data  This patient's last AWV date: : 08/26/2020 This patients last WCC/CPE date: : Not Found   Reason for Encounter Reason for Encounter: Outreach Primary Reason for Outreach: CCOO+ Text Message: No MyChart Message: No Outreach Call Outcome: Left Message

## 2024-01-31 ENCOUNTER — Inpatient Hospital Stay
Admission: EM | Admit: 2024-01-31 | Discharge: 2024-02-04 | DRG: 683 | Disposition: A | Discharge status: Skilled Nursing Facility | Source: Skilled Nursing Facility | Attending: Student in an Organized Health Care Education/Training Program | Admitting: Student in an Organized Health Care Education/Training Program

## 2024-01-31 ENCOUNTER — Emergency Department

## 2024-01-31 ENCOUNTER — Other Ambulatory Visit: Payer: Self-pay

## 2024-01-31 DIAGNOSIS — I130 Hypertensive heart and chronic kidney disease with heart failure and stage 1 through stage 4 chronic kidney disease, or unspecified chronic kidney disease: Secondary | ICD-10-CM | POA: Diagnosis present

## 2024-01-31 DIAGNOSIS — Z91048 Other nonmedicinal substance allergy status: Secondary | ICD-10-CM

## 2024-01-31 DIAGNOSIS — E1129 Type 2 diabetes mellitus with other diabetic kidney complication: Secondary | ICD-10-CM | POA: Diagnosis present

## 2024-01-31 DIAGNOSIS — R338 Other retention of urine: Secondary | ICD-10-CM | POA: Diagnosis present

## 2024-01-31 DIAGNOSIS — E785 Hyperlipidemia, unspecified: Secondary | ICD-10-CM | POA: Diagnosis present

## 2024-01-31 DIAGNOSIS — Z515 Encounter for palliative care: Secondary | ICD-10-CM

## 2024-01-31 DIAGNOSIS — N135 Crossing vessel and stricture of ureter without hydronephrosis: Secondary | ICD-10-CM | POA: Diagnosis present

## 2024-01-31 DIAGNOSIS — I2510 Atherosclerotic heart disease of native coronary artery without angina pectoris: Secondary | ICD-10-CM | POA: Diagnosis present

## 2024-01-31 DIAGNOSIS — Z8673 Personal history of transient ischemic attack (TIA), and cerebral infarction without residual deficits: Secondary | ICD-10-CM

## 2024-01-31 DIAGNOSIS — E1122 Type 2 diabetes mellitus with diabetic chronic kidney disease: Secondary | ICD-10-CM | POA: Diagnosis present

## 2024-01-31 DIAGNOSIS — Z66 Do not resuscitate: Secondary | ICD-10-CM | POA: Diagnosis present

## 2024-01-31 DIAGNOSIS — N138 Other obstructive and reflux uropathy: Secondary | ICD-10-CM | POA: Diagnosis present

## 2024-01-31 DIAGNOSIS — Z833 Family history of diabetes mellitus: Secondary | ICD-10-CM

## 2024-01-31 DIAGNOSIS — N184 Chronic kidney disease, stage 4 (severe): Secondary | ICD-10-CM | POA: Diagnosis present

## 2024-01-31 DIAGNOSIS — N179 Acute kidney failure, unspecified: Principal | ICD-10-CM | POA: Diagnosis present

## 2024-01-31 DIAGNOSIS — N401 Enlarged prostate with lower urinary tract symptoms: Secondary | ICD-10-CM | POA: Diagnosis present

## 2024-01-31 DIAGNOSIS — D869 Sarcoidosis, unspecified: Secondary | ICD-10-CM | POA: Diagnosis present

## 2024-01-31 DIAGNOSIS — D860 Sarcoidosis of lung: Secondary | ICD-10-CM | POA: Diagnosis present

## 2024-01-31 DIAGNOSIS — Z8249 Family history of ischemic heart disease and other diseases of the circulatory system: Secondary | ICD-10-CM

## 2024-01-31 DIAGNOSIS — R1084 Generalized abdominal pain: Secondary | ICD-10-CM

## 2024-01-31 DIAGNOSIS — N136 Pyonephrosis: Secondary | ICD-10-CM | POA: Diagnosis present

## 2024-01-31 DIAGNOSIS — Z951 Presence of aortocoronary bypass graft: Secondary | ICD-10-CM

## 2024-01-31 DIAGNOSIS — N400 Enlarged prostate without lower urinary tract symptoms: Secondary | ICD-10-CM | POA: Diagnosis present

## 2024-01-31 DIAGNOSIS — D649 Anemia, unspecified: Secondary | ICD-10-CM | POA: Insufficient documentation

## 2024-01-31 DIAGNOSIS — E1165 Type 2 diabetes mellitus with hyperglycemia: Secondary | ICD-10-CM | POA: Diagnosis present

## 2024-01-31 DIAGNOSIS — I480 Paroxysmal atrial fibrillation: Secondary | ICD-10-CM | POA: Diagnosis present

## 2024-01-31 DIAGNOSIS — E875 Hyperkalemia: Secondary | ICD-10-CM | POA: Diagnosis present

## 2024-01-31 DIAGNOSIS — I5032 Chronic diastolic (congestive) heart failure: Secondary | ICD-10-CM | POA: Diagnosis present

## 2024-01-31 DIAGNOSIS — N320 Bladder-neck obstruction: Secondary | ICD-10-CM | POA: Diagnosis present

## 2024-01-31 DIAGNOSIS — R109 Unspecified abdominal pain: Secondary | ICD-10-CM

## 2024-01-31 DIAGNOSIS — F0390 Unspecified dementia without behavioral disturbance: Secondary | ICD-10-CM | POA: Diagnosis present

## 2024-01-31 DIAGNOSIS — Z789 Other specified health status: Secondary | ICD-10-CM

## 2024-01-31 DIAGNOSIS — Z882 Allergy status to sulfonamides status: Secondary | ICD-10-CM

## 2024-01-31 DIAGNOSIS — N1330 Unspecified hydronephrosis: Secondary | ICD-10-CM | POA: Diagnosis present

## 2024-01-31 DIAGNOSIS — N189 Chronic kidney disease, unspecified: Principal | ICD-10-CM

## 2024-01-31 DIAGNOSIS — K861 Other chronic pancreatitis: Secondary | ICD-10-CM | POA: Diagnosis present

## 2024-01-31 DIAGNOSIS — L8990 Pressure ulcer of unspecified site, unspecified stage: Secondary | ICD-10-CM | POA: Insufficient documentation

## 2024-01-31 DIAGNOSIS — I252 Old myocardial infarction: Secondary | ICD-10-CM

## 2024-01-31 DIAGNOSIS — Z794 Long term (current) use of insulin: Secondary | ICD-10-CM

## 2024-01-31 DIAGNOSIS — Z87891 Personal history of nicotine dependence: Secondary | ICD-10-CM

## 2024-01-31 DIAGNOSIS — Z79899 Other long term (current) drug therapy: Secondary | ICD-10-CM

## 2024-01-31 LAB — CBC
HCT: 25.7 % — ABNORMAL LOW (ref 39.0–52.0)
Hemoglobin: 8.4 g/dL — ABNORMAL LOW (ref 13.0–17.0)
MCH: 28.7 pg (ref 26.0–34.0)
MCHC: 32.7 g/dL (ref 30.0–36.0)
MCV: 87.7 fL (ref 80.0–100.0)
Platelets: 80 K/uL — ABNORMAL LOW (ref 150–400)
RBC: 2.93 MIL/uL — ABNORMAL LOW (ref 4.22–5.81)
RDW: 16.5 % — ABNORMAL HIGH (ref 11.5–15.5)
WBC: 5.9 K/uL (ref 4.0–10.5)
nRBC: 0 % (ref 0.0–0.2)

## 2024-01-31 LAB — COMPREHENSIVE METABOLIC PANEL WITH GFR
ALT: 59 U/L — ABNORMAL HIGH (ref 0–44)
AST: 26 U/L (ref 15–41)
Albumin: 3.2 g/dL — ABNORMAL LOW (ref 3.5–5.0)
Alkaline Phosphatase: 60 U/L (ref 38–126)
Anion gap: 7 (ref 5–15)
BUN: 87 mg/dL — ABNORMAL HIGH (ref 8–23)
CO2: 20 mmol/L — ABNORMAL LOW (ref 22–32)
Calcium: 8.7 mg/dL — ABNORMAL LOW (ref 8.9–10.3)
Chloride: 105 mmol/L (ref 98–111)
Creatinine, Ser: 4.23 mg/dL — ABNORMAL HIGH (ref 0.61–1.24)
GFR, Estimated: 13 mL/min — ABNORMAL LOW
Glucose, Bld: 231 mg/dL — ABNORMAL HIGH (ref 70–99)
Potassium: 5.7 mmol/L — ABNORMAL HIGH (ref 3.5–5.1)
Sodium: 132 mmol/L — ABNORMAL LOW (ref 135–145)
Total Bilirubin: 0.7 mg/dL (ref 0.0–1.2)
Total Protein: 6.9 g/dL (ref 6.5–8.1)

## 2024-01-31 LAB — LIPASE, BLOOD: Lipase: 46 U/L (ref 11–51)

## 2024-01-31 MED ORDER — PANTOPRAZOLE SODIUM 40 MG IV SOLR
40.0000 mg | Freq: Once | INTRAVENOUS | Status: AC
  Administered 2024-02-01: 40 mg via INTRAVENOUS
  Filled 2024-01-31: qty 10

## 2024-01-31 MED ORDER — LACTATED RINGERS IV BOLUS
1000.0000 mL | Freq: Once | INTRAVENOUS | Status: AC
  Administered 2024-02-01: 1000 mL via INTRAVENOUS

## 2024-01-31 NOTE — ED Provider Notes (Signed)
 [facility] Provider Note    Event Date/Time   First MD Initiated Contact with Patient 01/31/24 2314     (approximate)   History   Abdominal Pain  Level 5 caveat:  history/ROS limited by chronic dementia  HPI Peter Robertson is a 83 y.o. male with an extensive chronic medical history which also includes dementia.  He comes from [facility] and his caregiver/healthcare power of attorney is at bedside.  The patient has been reporting lower abdominal pain that has been increasing over the last 2 weeks.  The patient reportedly has not been eating or drinking well.  He may or may not have also been having some nausea and vomiting.  The patient said that his abdomen was hurting earlier but now it is not.  He is DNR and is on hospice care but there is some disagreement about the level of care he and his caregivers want to pursue.  The patient says his abdomen does not hurt right now and he has no chest pain or shortness of breath.     Physical Exam   Triage Vital Signs: ED Triage Vitals  Encounter Vitals Group     BP 01/31/24 1817 113/65     Girls Systolic BP Percentile --      Girls Diastolic BP Percentile --      Boys Systolic BP Percentile --      Boys Diastolic BP Percentile --      Pulse Rate 01/31/24 1817 77     Resp 01/31/24 1817 18     Temp 01/31/24 1818 98 F (36.7 C)     Temp Source 01/31/24 1818 Oral     SpO2 01/31/24 1817 99 %     Weight 01/31/24 1817 68.8 kg (151 lb 10.8 oz)     Height 01/31/24 2250 1.676 m (5' 6)     Head Circumference --      Peak Flow --      Pain Score 01/31/24 1817 6     Pain Loc --      Pain Education --      Exclude from Growth Chart --     Most recent vital signs: Vitals:   01/31/24 2216 01/31/24 2229  BP:  (!) 113/98  Pulse: 65 65  Resp:  18  Temp:    SpO2: 100% 100%    General: Awake, alert and seemingly at his baseline, confused but able to carry on a conversation.  No obvious distress. CV:  Good  peripheral perfusion.  Regular rate and rhythm with normal heart sounds. Resp:  Normal effort. Speaking easily and comfortably, no accessory muscle usage nor intercostal retractions.  Lungs are clear to auscultation.  Occasional thick cough. Abd:  No distention.  Abdomen is soft.  As soon as I try to palpate his abdomen he grabs my wrist and warns me you had better not, but with some distraction and reassurance I am able to palpate his abdomen gently and he has some suprapubic and right lower quadrant tenderness but no guarding. Other:  No focal neurological deficits, moving all 4 extremities.   ED Results / Procedures / Treatments   Labs (all labs ordered are listed, but only abnormal results are displayed) Labs Reviewed  COMPREHENSIVE METABOLIC PANEL WITH GFR - Abnormal; Notable for the following components:      Result Value   Sodium 132 (*)    Potassium 5.7 (*)    CO2 20 (*)  Glucose, Bld 231 (*)    BUN 87 (*)    Creatinine, Ser 4.23 (*)    Calcium  8.7 (*)    Albumin  3.2 (*)    ALT 59 (*)    GFR, Estimated 13 (*)    All other components within normal limits  CBC - Abnormal; Notable for the following components:   RBC 2.93 (*)    Hemoglobin 8.4 (*)    HCT 25.7 (*)    RDW 16.5 (*)    Platelets 80 (*)    All other components within normal limits  LIPASE, BLOOD  URINALYSIS, ROUTINE W REFLEX MICROSCOPIC     RADIOLOGY See ED course for details   PROCEDURES:  Critical Care performed: No  Procedures    IMPRESSION / MDM / ASSESSMENT AND PLAN / ED COURSE  I reviewed the triage vital signs and the nursing notes.                              Differential diagnosis includes, but is not limited to, dehydration, renal failure, SBO/ileus, diverticulitis, biliary disease  Patient's presentation is most consistent with acute presentation with potential threat to life or bodily function.  Labs/studies ordered: CBC, urinalysis, CMP, CT  chest/abdomen/pelvis  Interventions/Medications given:  Medications  lactated ringers  bolus 1,000 mL (1,000 mLs Intravenous New Bag/Given 02/01/24 0001)  pantoprazole  (PROTONIX ) injection 40 mg (40 mg Intravenous Given 02/01/24 0001)    (Note:  hospital course my include additional interventions and/or labs/studies not listed above.)   Vital signs are stable.  Patient's labs notable for severe acute on chronic renal failure, elevated BUN, and hyperkalemia all consistent with a renal failure.  He appears volume depleted and his reportedly had decreased oral intake.  He has tenderness to palpation of the abdomen but is currently reporting no abdominal pain.  Initially his caregiver/healthcare power of attorney who is at bedside said comfort care only and that they do not want to do anything.  Then I explained the abnormalities including the renal failure and she said that the patient had previously expressed and that she would like us  to treat what ever we can without surgery or intubation.  I had a long conversation with both of them about how to proceed and they both said that they would like me to get a CT scan of the chest and abdomen and pelvis to see if there is an explanation for his abdominal pain as well as to make sure he does not have pneumonia, to treat his renal failure with fluids, etc.  The patient was asked directly and he said find out what is wrong with me and fix it!  I suspect the patient will benefit from a palliative care consult while in the hospital but I will plan to admit him after ruling out an acute surgical emergency with the CT scan.  I am starting IV fluids and order pantoprazole  40 mg to see if this helps with his stomach discomfort.     Clinical Course as of 02/01/24 0153  Fri Feb 01, 2024  0105 CT CHEST ABDOMEN PELVIS WO CONTRAST I viewed and interpreted the patient's CT chest/abdomen/pelvis.  I see no evidence of acute process in the lungs such as pneumonia.   Radiologist complemented on sarcoidosis changes.  In the abdomen his ureters seem to be enlarged and the radiologist also commented on bilateral hydroureteronephrosis to the level of the bladder with concern for bladder tumor  versus chronic bladder outlet obstruction.  As per my conversation with his caregiver and healthcare power of attorney, I will admit the patient for treatment of his acute on chronic renal failure and his hyperkalemia with understanding that the plan may be for increased level of palliative care. [CF]  0106 I am consulting the hospitalist team for admission.   [CF]  0133 I consulted by phone with the admitting hospitalist, and they will admit the patient - Dr. Cleatus. [CF]    Clinical Course User Index [CF] Gordan Huxley, MD     FINAL CLINICAL IMPRESSION(S) / ED DIAGNOSES   Final diagnoses:  Acute renal failure superimposed on chronic kidney disease, unspecified acute renal failure type, unspecified CKD stage (HCC)  Generalized abdominal pain  Bladder outlet obstruction  DNI (do not intubate)     Rx / DC Orders   ED Discharge Orders     None        Note:  This document was prepared using Dragon voice recognition software and may include unintentional dictation errors.   Gordan Huxley, MD 02/01/24 [phone]

## 2024-01-31 NOTE — ED Triage Notes (Signed)
 First nurse note: Pt to ED via [facility] from [facility]. Pt reports lower abd pain x2 wks. Staff reports today pt has not been eating. Pt is on hospice.

## 2024-02-01 ENCOUNTER — Encounter: Payer: Self-pay | Admitting: Internal Medicine

## 2024-02-01 DIAGNOSIS — I5032 Chronic diastolic (congestive) heart failure: Secondary | ICD-10-CM | POA: Diagnosis present

## 2024-02-01 DIAGNOSIS — R112 Nausea with vomiting, unspecified: Secondary | ICD-10-CM

## 2024-02-01 DIAGNOSIS — N401 Enlarged prostate with lower urinary tract symptoms: Secondary | ICD-10-CM | POA: Diagnosis present

## 2024-02-01 DIAGNOSIS — N179 Acute kidney failure, unspecified: Secondary | ICD-10-CM

## 2024-02-01 DIAGNOSIS — Z794 Long term (current) use of insulin: Secondary | ICD-10-CM | POA: Diagnosis not present

## 2024-02-01 DIAGNOSIS — I130 Hypertensive heart and chronic kidney disease with heart failure and stage 1 through stage 4 chronic kidney disease, or unspecified chronic kidney disease: Secondary | ICD-10-CM | POA: Diagnosis present

## 2024-02-01 DIAGNOSIS — L8990 Pressure ulcer of unspecified site, unspecified stage: Secondary | ICD-10-CM | POA: Insufficient documentation

## 2024-02-01 DIAGNOSIS — N138 Other obstructive and reflux uropathy: Secondary | ICD-10-CM | POA: Diagnosis present

## 2024-02-01 DIAGNOSIS — Z66 Do not resuscitate: Secondary | ICD-10-CM | POA: Diagnosis present

## 2024-02-01 DIAGNOSIS — R338 Other retention of urine: Secondary | ICD-10-CM | POA: Diagnosis present

## 2024-02-01 DIAGNOSIS — I480 Paroxysmal atrial fibrillation: Secondary | ICD-10-CM | POA: Diagnosis present

## 2024-02-01 DIAGNOSIS — Z515 Encounter for palliative care: Secondary | ICD-10-CM | POA: Diagnosis not present

## 2024-02-01 DIAGNOSIS — N1330 Unspecified hydronephrosis: Secondary | ICD-10-CM

## 2024-02-01 DIAGNOSIS — R109 Unspecified abdominal pain: Secondary | ICD-10-CM

## 2024-02-01 DIAGNOSIS — Z951 Presence of aortocoronary bypass graft: Secondary | ICD-10-CM | POA: Diagnosis not present

## 2024-02-01 DIAGNOSIS — Z87891 Personal history of nicotine dependence: Secondary | ICD-10-CM | POA: Diagnosis not present

## 2024-02-01 DIAGNOSIS — E875 Hyperkalemia: Secondary | ICD-10-CM | POA: Diagnosis present

## 2024-02-01 DIAGNOSIS — Z8249 Family history of ischemic heart disease and other diseases of the circulatory system: Secondary | ICD-10-CM | POA: Diagnosis not present

## 2024-02-01 DIAGNOSIS — N320 Bladder-neck obstruction: Secondary | ICD-10-CM | POA: Diagnosis present

## 2024-02-01 DIAGNOSIS — D649 Anemia, unspecified: Secondary | ICD-10-CM | POA: Insufficient documentation

## 2024-02-01 DIAGNOSIS — E785 Hyperlipidemia, unspecified: Secondary | ICD-10-CM | POA: Diagnosis present

## 2024-02-01 DIAGNOSIS — N184 Chronic kidney disease, stage 4 (severe): Secondary | ICD-10-CM | POA: Diagnosis present

## 2024-02-01 DIAGNOSIS — I2510 Atherosclerotic heart disease of native coronary artery without angina pectoris: Secondary | ICD-10-CM | POA: Diagnosis present

## 2024-02-01 DIAGNOSIS — K861 Other chronic pancreatitis: Secondary | ICD-10-CM | POA: Diagnosis present

## 2024-02-01 DIAGNOSIS — Z833 Family history of diabetes mellitus: Secondary | ICD-10-CM | POA: Diagnosis not present

## 2024-02-01 DIAGNOSIS — F0390 Unspecified dementia without behavioral disturbance: Secondary | ICD-10-CM | POA: Diagnosis present

## 2024-02-01 DIAGNOSIS — E1165 Type 2 diabetes mellitus with hyperglycemia: Secondary | ICD-10-CM | POA: Diagnosis present

## 2024-02-01 DIAGNOSIS — R1084 Generalized abdominal pain: Secondary | ICD-10-CM | POA: Diagnosis present

## 2024-02-01 DIAGNOSIS — N136 Pyonephrosis: Secondary | ICD-10-CM | POA: Diagnosis present

## 2024-02-01 DIAGNOSIS — E1122 Type 2 diabetes mellitus with diabetic chronic kidney disease: Secondary | ICD-10-CM | POA: Diagnosis present

## 2024-02-01 DIAGNOSIS — D860 Sarcoidosis of lung: Secondary | ICD-10-CM | POA: Diagnosis present

## 2024-02-01 HISTORY — DX: Nausea with vomiting, unspecified: R11.2

## 2024-02-01 LAB — CBC
HCT: 25.5 % — ABNORMAL LOW (ref 39.0–52.0)
Hemoglobin: 8.2 g/dL — ABNORMAL LOW (ref 13.0–17.0)
MCH: 28.3 pg (ref 26.0–34.0)
MCHC: 32.2 g/dL (ref 30.0–36.0)
MCV: 87.9 fL (ref 80.0–100.0)
Platelets: 77 K/uL — ABNORMAL LOW (ref 150–400)
RBC: 2.9 MIL/uL — ABNORMAL LOW (ref 4.22–5.81)
RDW: 16.5 % — ABNORMAL HIGH (ref 11.5–15.5)
WBC: 5.4 K/uL (ref 4.0–10.5)
nRBC: 0 % (ref 0.0–0.2)

## 2024-02-01 LAB — URINALYSIS, ROUTINE W REFLEX MICROSCOPIC
Bacteria, UA: NONE SEEN
Bilirubin Urine: NEGATIVE
Glucose, UA: NEGATIVE mg/dL
Ketones, ur: NEGATIVE mg/dL
Nitrite: NEGATIVE
Protein, ur: 100 mg/dL — AB
RBC / HPF: >50 RBC/hpf (ref 0–5)
Specific Gravity, Urine: 1.01 (ref 1.005–1.030)
Squamous Epithelial / HPF: 0 /HPF (ref 0–5)
WBC, UA: >50 WBC/hpf (ref 0–5)
pH: 5 (ref 5.0–8.0)

## 2024-02-01 LAB — GLUCOSE, CAPILLARY
Glucose-Capillary: 168 mg/dL — ABNORMAL HIGH (ref 70–99)
Glucose-Capillary: 111 mg/dL — ABNORMAL HIGH (ref 70–99)
Glucose-Capillary: 153 mg/dL — ABNORMAL HIGH (ref 70–99)
Glucose-Capillary: 184 mg/dL — ABNORMAL HIGH (ref 70–99)
Glucose-Capillary: 160 mg/dL — ABNORMAL HIGH (ref 70–99)

## 2024-02-01 LAB — BASIC METABOLIC PANEL WITH GFR
Anion gap: 8 (ref 5–15)
BUN: 88 mg/dL — ABNORMAL HIGH (ref 8–23)
CO2: 21 mmol/L — ABNORMAL LOW (ref 22–32)
Calcium: 8.8 mg/dL — ABNORMAL LOW (ref 8.9–10.3)
Chloride: 110 mmol/L (ref 98–111)
Creatinine, Ser: 4.16 mg/dL — ABNORMAL HIGH (ref 0.61–1.24)
GFR, Estimated: 13 mL/min — ABNORMAL LOW (ref >60)
Glucose, Bld: 162 mg/dL — ABNORMAL HIGH (ref 70–99)
Potassium: 5.8 mmol/L — ABNORMAL HIGH (ref 3.5–5.1)
Sodium: 136 mmol/L (ref 135–145)

## 2024-02-01 MED ORDER — ONDANSETRON HCL 4 MG PO TABS: 4.0000 mg | ORAL_TABLET | Freq: Four times a day (QID) | ORAL | Status: DC | PRN

## 2024-02-01 MED ORDER — PANTOPRAZOLE SODIUM 40 MG IV SOLR
40.0000 mg | INTRAVENOUS | Status: DC
  Administered 2024-02-01 – 2024-02-04 (×4): 40 mg via INTRAVENOUS
  Filled 2024-02-01 (×4): qty 10

## 2024-02-01 MED ORDER — SODIUM CHLORIDE 0.9 % IV SOLN: INTRAVENOUS | Status: AC

## 2024-02-01 MED ORDER — HEPARIN SODIUM (PORCINE) 5000 UNIT/ML IJ SOLN
5000.0000 [IU] | Freq: Three times a day (TID) | INTRAMUSCULAR | Status: DC
  Administered 2024-02-01 – 2024-02-04 (×10): 5000 [IU] via SUBCUTANEOUS
  Filled 2024-02-01 (×10): qty 1

## 2024-02-01 MED ORDER — AMIODARONE HCL 200 MG PO TABS
200.0000 mg | ORAL_TABLET | Freq: Two times a day (BID) | ORAL | Status: DC
  Administered 2024-02-01 – 2024-02-04 (×7): 200 mg via ORAL
  Filled 2024-02-01 (×8): qty 1

## 2024-02-01 MED ORDER — ACETAMINOPHEN 325 MG PO TABS
650.0000 mg | ORAL_TABLET | Freq: Four times a day (QID) | ORAL | Status: DC | PRN
  Administered 2024-02-01 – 2024-02-02 (×3): 650 mg via ORAL
  Filled 2024-02-01 (×4): qty 2

## 2024-02-01 MED ORDER — SODIUM ZIRCONIUM CYCLOSILICATE 10 G PO PACK
10.0000 g | PACK | Freq: Once | ORAL | Status: AC
  Administered 2024-02-01: 10 g via ORAL
  Filled 2024-02-01 (×2): qty 1

## 2024-02-01 MED ORDER — OXYCODONE HCL 5 MG PO TABS: 5.0000 mg | ORAL_TABLET | Freq: Four times a day (QID) | ORAL | Status: DC | PRN

## 2024-02-01 MED ORDER — ENOXAPARIN SODIUM 40 MG/0.4ML IJ SOSY: 40.0000 mg | PREFILLED_SYRINGE | INTRAMUSCULAR | Status: DC

## 2024-02-01 MED ORDER — SODIUM CHLORIDE 0.9 % IV SOLN: INTRAVENOUS | Status: DC

## 2024-02-01 MED ORDER — CHLORHEXIDINE GLUCONATE CLOTH 2 % EX PADS
6.0000 | MEDICATED_PAD | Freq: Every day | CUTANEOUS | Status: DC
  Administered 2024-02-01 – 2024-02-04 (×4): 6 via TOPICAL

## 2024-02-01 MED ORDER — INSULIN ASPART 100 UNIT/ML IJ SOLN
0.0000 [IU] | INTRAMUSCULAR | Status: DC
  Administered 2024-02-01: 2 [IU] via SUBCUTANEOUS
  Filled 2024-02-01: qty 1

## 2024-02-01 MED ORDER — ONDANSETRON HCL 4 MG/2ML IJ SOLN
4.0000 mg | Freq: Four times a day (QID) | INTRAMUSCULAR | Status: DC | PRN
  Administered 2024-02-01 – 2024-02-02 (×4): 4 mg via INTRAVENOUS
  Filled 2024-02-01 (×4): qty 2

## 2024-02-01 MED ORDER — ACETAMINOPHEN 650 MG RE SUPP: 650.0000 mg | Freq: Four times a day (QID) | RECTAL | Status: DC | PRN

## 2024-02-01 MED ORDER — SODIUM CHLORIDE 0.9 % IV SOLN
1.0000 g | INTRAVENOUS | Status: DC
  Administered 2024-02-01 – 2024-02-03 (×3): 1 g via INTRAVENOUS
  Filled 2024-02-01 (×4): qty 10

## 2024-02-01 MED ORDER — INSULIN ASPART 100 UNIT/ML IJ SOLN
0.0000 [IU] | Freq: Three times a day (TID) | INTRAMUSCULAR | Status: DC
  Administered 2024-02-01: 2 [IU] via SUBCUTANEOUS
  Administered 2024-02-02: 1 [IU] via SUBCUTANEOUS
  Filled 2024-02-01 (×3): qty 1

## 2024-02-01 NOTE — Assessment & Plan Note (Signed)
 Sliding scale insulin  coverage

## 2024-02-01 NOTE — H&P (Signed)
 History and Physical    Patient: Peter Robertson FMW:[id number] DOB: November 25, 1940 DOA: 01/31/2024 DOS: the patient was seen and examined on 02/01/2024 PCP: System, Provider Not In  Patient coming from: SNF  Chief Complaint:  Chief Complaint  Patient presents with   Abdominal Pain    HPI: Peter Robertson is a 83 y.o. 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  Patient reportedly has been having abdominal pain that has been increasing over the past 2 weeks associated with nausea and occasional vomiting and in the past 24 hours has had almost no oral intake.   In the ED, vitals within normal limits Labs notable for AKI with creatinine of 4.23 up from baseline of around 2.85 with potassium of 5.7 and bicarb of 20.  WBC was normal at 5.9.  Hemoglobin at baseline at 8.4.  Glucose elevated at 231.Lipase and LFTs unremarkable CT chest ,abdomen and pelvis showed no acute intrathoracic or intra-abdominal pathology.  Did show moderate bilateral hydronephrosis and bladder wall thickening concerning for urothelial carcinoma with recommendation for cystoscopy.  Also showed chronic pancreatitis.  Patient treated with an LR bolus and Protonix . Admission requested.    Past Medical History:  Diagnosis Date   Asthma    BPH (benign prostatic hyperplasia)    Chronic heart failure with preserved ejection fraction (HFpEF) (HCC)    a. 07/2022 Echo: EF >55%, GrII DD, mod dil LA, nl RV size/fxn.   CKD (chronic kidney disease), stage III (HCC)    Coronary artery disease    a. 05/2010 s/p CABG x 2 (LIMA->LAD, VG->OM); b. 01/2017 Cath:  LM50d, LAD 100ost, patent prox stent, 64m/d, RI 80p, LCX mild diff  dzs, RCA min irregs, LIMA->LAD patent, VG->OM 100-->Med rx.   Dementia (HCC)    Diabetes mellitus without complication (HCC)    Falls frequently    Heart murmur    Hyperlipidemia    Hypertension    Intractable nausea and vomiting 02/01/2024   IVH (intraventricular hemorrhage) (HCC)    a. s/p external ventricular drain - removed 06/2022.   Myocardial infarction (HCC)    Nephrolithiasis    PAF (paroxysmal atrial fibrillation) (HCC)    a. No OAC 2/2 falls.   Pulmonary sarcoidosis (HCC)    RBBB    Sepsis (HCC)    a. multiple admissions - urosepsis.   Past Surgical History:  Procedure Laterality Date   COLONOSCOPY WITH PROPOFOL  N/A 12/03/2014   Procedure: COLONOSCOPY WITH PROPOFOL ;  Surgeon: Lamar ONEIDA Holmes, MD;  Location: [facility] ENDOSCOPY;  Service: Endoscopy;  Laterality: N/A;   CORONARY ARTERY BYPASS GRAFT     LEFT HEART CATH AND CORS/GRAFTS ANGIOGRAPHY N/A 05/22/2018   Procedure: LEFT HEART CATH AND CORS/GRAFTS ANGIOGRAPHY poss PCI;  Surgeon: Perla Evalene PARAS, MD;  Location: [facility] INVASIVE CV LAB;  Service: Cardiovascular;  Laterality: N/A;   Social History:  reports that he quit smoking about 39 years ago. He has never used smokeless tobacco. He reports that he does not drink alcohol and does not use drugs.  Allergies  Allergen Reactions   Sulfamethoxazole-Trimethoprim Other (See Comments)    dizziness dizziness dizziness    Tape Rash    blisters    Family History  Problem Relation Age  of Onset   Diabetes Mother    Cancer Mother    Hypertension Father    Hypertension Brother    Diabetes Brother     Prior to Admission medications   Medication Sig Start Date End Date Taking? Authorizing Provider  albuterol  (PROVENTIL ) (2.5 MG/3ML) 0.083% nebulizer solution Take 2.5 mg by nebulization every 4 (four) hours as needed. Patient not taking: Reported on 10/06/2023    [provider]  alum & mag hydroxide-simeth (MAALOX/MYLANTA) 200-200-20 MG/5ML suspension Take 15 mLs  by mouth every 6 (six) hours as needed for indigestion or heartburn. Patient not taking: Reported on 10/06/2023 05/23/21   Amin, Sumayya, MD  amiodarone  (PACERONE ) 200 MG tablet Take 1 tablet (200 mg total) by mouth 2 (two) times daily. 10/07/23   Jhonny Calvin NOVAK, MD  diltiazem  (CARDIZEM ) 30 MG tablet Take 1 tablet (30 mg total) by mouth 2 (two) times daily. 10/07/23   Jhonny Calvin NOVAK, MD  feeding supplement (ENSURE ENLIVE / ENSURE PLUS) LIQD Take 237 mLs by mouth 3 (three) times daily between meals. 11/27/22   Amin, Sumayya, MD  metoprolol  tartrate (LOPRESSOR ) 25 MG tablet Take 1 tablet (25 mg total) by mouth 2 (two) times daily. 10/07/23   Jhonny Calvin NOVAK, MD  sertraline  (ZOLOFT ) 50 MG tablet Take 1 tablet by mouth daily. 08/28/22   [provider]  tamsulosin  (FLOMAX ) 0.4 MG CAPS capsule Take 0.8 mg by mouth at bedtime.     [provider]    Physical Exam: Vitals:   01/31/24 2215 01/31/24 2216 01/31/24 2229 01/31/24 2250  BP: (!) 113/98  (!) 113/98   Pulse:  65 65   Resp:   18   Temp:      TempSrc:      SpO2:  100% 100%   Weight:    68.8 kg  Height:    5' 6 (1.676 m)   Physical Exam Vitals and nursing note reviewed.  Constitutional:      General: He is not in acute distress.    Comments: Pleasantly confused  HENT:     Head: Normocephalic and atraumatic.  Cardiovascular:     Rate and Rhythm: Normal rate and regular rhythm.     Heart sounds: Normal heart sounds.  Pulmonary:     Effort: Pulmonary effort is normal.     Breath sounds: Normal breath sounds.  Abdominal:     Palpations: Abdomen is soft.     Tenderness: There is no abdominal tenderness.  Neurological:     Mental Status: Mental status is at baseline.     Labs on Admission: I have personally reviewed following labs and imaging studies  CBC: Recent Labs  Lab 01/31/24 1814  WBC 5.9  HGB 8.4*  HCT 25.7*  MCV 87.7  PLT 80*   Basic Metabolic Panel: Recent Labs  Lab 01/31/24 1814   NA 132*  K 5.7*  CL 105  CO2 20*  GLUCOSE 231*  BUN 87*  CREATININE 4.23*  CALCIUM  8.7*   GFR: Estimated Creatinine Clearance: 11.7 mL/min (A) (by C-G formula based on SCr of 4.23 mg/dL (H)). Liver Function Tests: Recent Labs  Lab 01/31/24 1814  AST 26  ALT 59*  ALKPHOS 60  BILITOT 0.7  PROT 6.9  ALBUMIN  3.2*   Recent Labs  Lab 01/31/24 1814  LIPASE 46   No results for input(s): AMMONIA in the last 168 hours. Coagulation Profile: No results for input(s): INR, PROTIME in the last 168 hours. Cardiac Enzymes: No results  for input(s): CKTOTAL, CKMB, CKMBINDEX, TROPONINI in the last 168 hours. BNP (last 3 results) No results for input(s): PROBNP in the last 8760 hours. HbA1C: No results for input(s): HGBA1C in the last 72 hours. CBG: No results for input(s): GLUCAP in the last 168 hours. Lipid Profile: No results for input(s): CHOL, HDL, LDLCALC, TRIG, CHOLHDL, LDLDIRECT in the last 72 hours. Thyroid Function Tests: No results for input(s): TSH, T4TOTAL, FREET4, T3FREE, THYROIDAB in the last 72 hours. Anemia Panel: No results for input(s): VITAMINB12, FOLATE, FERRITIN, TIBC, IRON, RETICCTPCT in the last 72 hours. Urine analysis:    Component Value Date/Time   COLORURINE YELLOW (A) 07/20/2023 1026   APPEARANCEUR TURBID (A) 07/20/2023 1026   LABSPEC 1.011 07/20/2023 1026   PHURINE 5.0 07/20/2023 1026   GLUCOSEU 150 (A) 07/20/2023 1026   HGBUR LARGE (A) 07/20/2023 1026   BILIRUBINUR NEGATIVE 07/20/2023 1026   KETONESUR NEGATIVE 07/20/2023 1026   PROTEINUR 100 (A) 07/20/2023 1026   NITRITE NEGATIVE 07/20/2023 1026   LEUKOCYTESUR MODERATE (A) 07/20/2023 1026    Radiological Exams on Admission: CT CHEST ABDOMEN PELVIS WO CONTRAST Result Date: 02/01/2024 CLINICAL DATA:  Cough, abdominal pain, nausea, vomiting EXAM: CT CHEST, ABDOMEN AND PELVIS WITHOUT CONTRAST TECHNIQUE: Multidetector CT imaging of the chest,  abdomen and pelvis was performed following the standard protocol without IV contrast. RADIATION DOSE REDUCTION: This exam was performed according to the departmental dose-optimization program which includes automated exposure control, adjustment of the mA and/or kV according to patient size and/or use of iterative reconstruction technique. COMPARISON:  07/18/2023 FINDINGS: CT CHEST FINDINGS Cardiovascular: Aortic valvular calcification. Status post coronary artery bypass grafting. Global cardiac size iswithin normal limits. No pericardial effusion. Central pulmonary arteries are of normal caliber. Mild atherosclerotic calcification within the thoracic aorta. No aortic aneurysm. Mediastinum/Nodes: 16 mm nodule within the right thyroid gland is decreased in size since remote prior sonogram of 05/16/2021. No follow-up imaging is recommended. There is extensive calcified mediastinal adenopathy in keeping with changes of underlying sarcoidosis. The esophagus is unremarkable. Lungs/Pleura: There is architectural distortion, masslike consolidation, and parenchymal calcification within the lung apices and within the subpleural regions at the lung bases and along major fissures in keeping with changes pulmonary sarcoidosis. The changes appear minimally progressed since remote prior examination of 05/21/2014. No active inflammatory infiltrates are identified. No pneumothorax or pleural effusion. No central obstructing lesion. Musculoskeletal: No acute bone abnormality. No lytic or blastic bone lesion CT ABDOMEN PELVIS FINDINGS Hepatobiliary: No focal liver abnormality is seen. No gallstones, gallbladder wall thickening, or biliary dilatation. Pancreas: Parenchymal calcifications within the head and uncinate process of the pancreas are in keeping with changes of chronic pancreatitis. No superimposed peripancreatic inflammatory changes. Spleen: Mild splenomegaly noted with the spleen measuring 14.6 cm in greatest dimension,  similar prior examination. No intrasplenic lesion seen. Adrenals/Urinary Tract: The adrenal glands are unremarkable. The kidneys are normal in size and position. Moderate bilateral hydronephrosis and hydroureter to the level the bladder. The bladder itself is relatively contracted demonstrates irregular wall thickening suspicious for a infiltrative intramural mass as can be seen with urothelial carcinoma. This appears progressive since prior examination. Stomach/Bowel: Stomach is within normal limits. Appendix appears normal. No evidence of bowel wall thickening, distention, or inflammatory changes. Vascular/Lymphatic: Aortic atherosclerosis. No enlarged abdominal or pelvic lymph nodes. Reproductive: Mild prostatic hypertrophy Other: Small bilateral fat containing inguinal hernias Musculoskeletal: Stable remote L1 compression deformity. No acute bone abnormality. No lytic or blastic bone lesion. Osseous structures are age appropriate. IMPRESSION: 1.  No acute intrathoracic or intra-abdominal pathology identified. 2. Moderate bilateral hydronephrosis and hydroureter to the level the bladder. The bladder itself is relatively contracted demonstrates irregular wall thickening suspicious for a infiltrative intramural mass as can be seen with urothelial carcinoma. The irregularity of wall thickening would argue against a more benign process such as chronic bladder outlet obstruction. This appears progressive since prior examination. Correlation with cystoscopy is recommended. 3. Changes of pulmonary sarcoidosis, minimally progressed since remote prior examination of 05/21/2014. 4. Aortic valvular calcification. Status post coronary artery bypass grafting. 5. Mild splenomegaly, similar to prior examination. 6. Changes of chronic pancreatitis. 7. Aortic atherosclerosis. Aortic Atherosclerosis (ICD10-I70.0). Electronically Signed   By: Dorethia Molt M.D.   On: 02/01/2024 00:52   Data Reviewed for HPI: Relevant notes from  primary care and specialist visits, past discharge summaries as available in EHR, including Care Everywhere. Prior diagnostic testing as pertinent to current admission diagnoses Updated medications and problem lists for reconciliation ED course, including vitals, labs, imaging, treatment and response to treatment Triage notes, nursing and pharmacy notes and ED provider's notes Notable results as noted above in HPI      Assessment and Plan: * Acute kidney injury superimposed on stage 4 chronic kidney disease (HCC) Suspect prerenal from vomiting as well as obstructive given hydronephrosis IV hydration Foley decompression Monitor renal function and avoid nephrotoxins Can consider urology consult manage pending further goals of care discussion with POA  Abdominal pain Decreased oral intake CT chest abdomen and pelvis with hydronephrosis, possible urothelial carcinoma and sarcoidosis but without infection Follow urinalysis Antiemetics, Protonix  and supportive care Will keep n.p.o.   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 April 2024 Acute on chronic bladder outlet obstruction/BPH CT concerning for urothelial carcinoma Foley being attempted in the ED Patient is on hospice however family now wants to treat the treatable but wants no procedures or surgeries Holding off on urology consult  Uncontrolled type 2 diabetes mellitus with hyperglycemia, with long-term current use of insulin  (HCC) Sliding scale insulin  coverage  Hyperkalemia Secondary to AKI Lokelma  x 1 Expecting improvement with hydration Serial BMP  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) Continue metoprolol   Paroxysmal atrial fibrillation (HCC) Continue amiodarone , diltiazem  and metoprolol  Not on anticoagulation  Chronic diastolic CHF (congestive heart failure) (HCC) Clinically dry Monitor for fluid overload with IV fluid  resuscitation  Sarcoidosis No acute issues.  Stable findings on CT  Type II diabetes mellitus with renal manifestations (HCC) Sliding scale insulin  coverage  Dementia without behavioral disturbance (HCC) Delirium precautions  Chronic anemia At baseline    DVT prophylaxis: Lovenox   Consults: none  Advance Care Planning:   Code Status: Limited: Do not attempt resuscitation (DNR) -DNR-LIMITED -Do Not Intubate/DNI    Family Communication: none  Disposition Plan: Back to previous home environment  Severity of Illness: The appropriate patient status for this patient is OBSERVATION. Observation status is judged to be reasonable and necessary in order to provide the required intensity of service to ensure the patient's safety. The patient's presenting symptoms, physical exam findings, and initial radiographic and laboratory data in the context of their medical condition is felt to place them at decreased risk for further clinical deterioration. Furthermore, it is anticipated that the patient will be medically stable for discharge from the hospital within 2 midnights of admission.   Author: Delayne LULLA Solian, MD 02/01/2024 2:01 AM  For on call review [email or web address].

## 2024-02-01 NOTE — Progress Notes (Signed)
 [facility]  [facility] Hospitalized Hospice Patient  [name] is a current hospice patient followed at [facility] for terminal diagnosis of CVD.  Patient was sent to the ED by [facility] staff due to patient complaint of lower abdominal pain and decreased appetite.  Patient was admitted to [facility] on 8.15.2025 with diagnosis of AKI stage 4 CKD.  Per Dr. Norleen Robertson, hospice MD, this is a related hospitalization.  Peter Robertson is appropriate for GIP level of care requiring skilled level of care to manage and monitor effectiviness of interventions- including IV hydration, IV antibiotics.   Vital Signs:   BP 113/98, P 65, R 18   Oxi 100% RA Height:  5'6 Weight:  140#  Abnormal Labs:   NA 132, K 5.7, C02 20, Glucose 231, Bun 87, Creat 4.23, Ca 8.7, HGB 8.4, HCT 25.7, PLT 80, ALT 59, Albumin  3.2  I&O:   InTake PO total 758.6 No Output recorded.  IV MEDS/PRN: Maxipime  1g IVPB every day LR Bolus 1L x1 Zofran  4mg  IV every 6 hours prn  x1 dose 8.15   Diagnostics: CT CHEST ABDOMEN PELVIS WO CONTRAST Result Date: 02/01/2024 CLINICAL DATA:  Cough, abdominal pain, nausea, vomiting EXAM: CT CHEST, ABDOMEN AND PELVIS WITHOUT CONTRAST TECHNIQUE: Multidetector CT imaging of the chest, abdomen and pelvis was performed following the standard protocol without IV contrast. RADIATION DOSE REDUCTION: This exam was performed according to the departmental dose-optimization program which includes automated exposure control, adjustment of the mA and/or kV according to patient size and/or use of iterative reconstruction technique. COMPARISON:  07/18/2023 FINDINGS: CT CHEST FINDINGS Cardiovascular: Aortic valvular calcification. Status post coronary artery bypass grafting. Global cardiac size iswithin normal limits. No pericardial effusion. Central pulmonary arteries are of normal caliber. Mild atherosclerotic calcification within the thoracic aorta. No aortic aneurysm. Mediastinum/Nodes: 16 mm nodule  within the right thyroid gland is decreased in size since remote prior sonogram of 05/16/2021. No follow-up imaging is recommended. There is extensive calcified mediastinal adenopathy in keeping with changes of underlying sarcoidosis. The esophagus is unremarkable. Lungs/Pleura: There is architectural distortion, masslike consolidation, and parenchymal calcification within the lung apices and within the subpleural regions at the lung bases and along major fissures in keeping with changes pulmonary sarcoidosis. The changes appear minimally progressed since remote prior examination of 05/21/2014. No active inflammatory infiltrates are identified. No pneumothorax or pleural effusion. No central obstructing lesion. Musculoskeletal: No acute bone abnormality. No lytic or blastic bone lesion CT ABDOMEN PELVIS FINDINGS Hepatobiliary: No focal liver abnormality is seen. No gallstones, gallbladder wall thickening, or biliary dilatation. Pancreas: Parenchymal calcifications within the head and uncinate process of the pancreas are in keeping with changes of chronic pancreatitis. No superimposed peripancreatic inflammatory changes. Spleen: Mild splenomegaly noted with the spleen measuring 14.6 cm in greatest dimension, similar prior examination. No intrasplenic lesion seen. Adrenals/Urinary Tract: The adrenal glands are unremarkable. The kidneys are normal in size and position. Moderate bilateral hydronephrosis and hydroureter to the level the bladder. The bladder itself is relatively contracted demonstrates irregular wall thickening suspicious for a infiltrative intramural mass as can be seen with urothelial carcinoma. This appears progressive since prior examination. Stomach/Bowel: Stomach is within normal limits. Appendix appears normal. No evidence of bowel wall thickening, distention, or inflammatory changes. Vascular/Lymphatic: Aortic atherosclerosis. No enlarged abdominal or pelvic lymph nodes. Reproductive: Mild  prostatic hypertrophy Other: Small bilateral fat containing inguinal hernias Musculoskeletal: Stable remote L1 compression deformity. No acute bone abnormality. No lytic or blastic bone lesion. Osseous  structures are age appropriate. IMPRESSION: 1. No acute intrathoracic or intra-abdominal pathology identified. 2. Moderate bilateral hydronephrosis and hydroureter to the level the bladder. The bladder itself is relatively contracted demonstrates irregular wall thickening suspicious for a infiltrative intramural mass as can be seen with urothelial carcinoma. The irregularity of wall thickening would argue against a more benign process such as chronic bladder outlet obstruction. This appears progressive since prior examination. Correlation with cystoscopy is recommended. 3. Changes of pulmonary sarcoidosis, minimally progressed since remote prior examination of 05/21/2014. 4. Aortic valvular calcification. Status post coronary artery bypass grafting. 5. Mild splenomegaly, similar to prior examination. 6. Changes of chronic pancreatitis. 7. Aortic atherosclerosis. Aortic Atherosclerosis (ICD10-I70.0). Electronically Signed   By: Peter Robertson M.D.   On: 02/01/2024 00:52           Assessment and Plan:  per H&P by Dr. 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 Solian foley placed today with cloudy, thick urine.  - UA positive for infection- sent for culture. Started on cefepime .  - - concern that his obstruction is from malignant process but he and caregiver have elected hospice care and non-invasive treatments only previously. Tried to confirm with his caregiver multiple times but unable to reach. Will confirm GOC prior to pursuing definitive dx of outlet obstruction. For now, will need to keep in foley and attempt to avoid removing as it will be difficult and traumatic to replace.              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  Stable findings on CT   Type II diabetes mellitus with renal manifestations (HCC) Sliding scale insulin  coverage   Dementia without behavioral disturbance (HCC) Delirium precautions   Chronic anemia At baseline  Discharge Plan:  ongoing- patient was admitted today.  Tentative plan per Peter Robertson is return to [facility].  Family- None present- left message  IDT:   updated  Goals of Care:  clear- DNR- comfort approach   Hospice Detail Summary report sent for upload into patient's chart.  Please call with any hospice related questions or concerns.  [name] Na, RN Nurse Liaison [phone]

## 2024-02-01 NOTE — Progress Notes (Signed)
 Pharmacy Antibiotic Note  Peter Robertson is a 83 y.o. male admitted on 01/31/2024 with lower abdominal pain. PMH significant for HTN, DM asthma, CAD, CKD 4, dementia, dCHF, A-fib not on anticoagulants, history of UTIs and hydronephrosis secondary to UPJ obstruction (nephrostomy tubes in April 2024), chronic pancreatitis.  Pharmacy has been consulted for Cefepime  dosing.  Plan: Cefepime  1 g q24h based on renal function  Monitor renal function and signs of clinical improvement.   Height: 5' 6 (167.6 cm) Weight: 63.6 kg (140 lb 3.4 oz) IBW/kg (Calculated) : 63.8  Temp (24hrs), Avg:97.8 F (36.6 C), Min:97.6 F (36.4 C), Max:98 F (36.7 C)  Recent Labs  Lab 01/31/24 1814 02/01/24 0433  WBC 5.9 5.4  CREATININE 4.23* 4.16*    Estimated Creatinine Clearance: 11.9 mL/min (A) (by C-G formula based on SCr of 4.16 mg/dL (H)).    Allergies  Allergen Reactions   Sulfamethoxazole-Trimethoprim Other (See Comments)    dizziness dizziness dizziness    Tape Rash    blisters    Antimicrobials this admission: 8/15 Cefepime  >>    Dose adjustments this admission:   Microbiology results: 8/15 Ucx: ordered    Thank you for allowing pharmacy to be a part of this patient's care.  Ransom Blanch PGY-1 Pharmacy Resident  [facility]  02/01/2024 1:31 PM

## 2024-02-01 NOTE — Progress Notes (Addendum)
  INTERVAL PROGRESS NOTE Peter Robertson- 83 y.o. male  LOS: 0 __________________________________________________________________  SUBJECTIVE: Admitted 01/31/2024 with cc of  Chief Complaint  Patient presents with   Abdominal Pain   Since admission, continued to have pelvic pain. Unsuccessful attempt in ED to place foley. A cath was placed today while inpatient.   OBJECTIVE: Blood pressure (!) 127/54, pulse 71, temperature 97.8 F (36.6 C), resp. rate 18, height 5' 6 (1.676 m), weight 63.6 kg, SpO2 100%.  General: appears to be in distress Abdomen: soft, nontender, nondistended, no hepatic or splenomegaly, +BS Extremities: no edema. WWP. Skin: erythema on penis appears to be irritation dermatitis  Neuro: alert and oriented  ASSESSMENT/PLAN:  I have reviewed the full H&P by Dr. Cleatus, and I reviewed pertinent findings.  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  - UA positive for infection- sent for culture. Started on cefepime .  - concern that his obstruction is from malignant process but he and caregiver have elected hospice care and non-invasive treatments only previously. Tried to confirm with his caregiver multiple times but unable to reach. Will confirm GOC prior to pursuing definitive dx of outlet obstruction. For now, will need to keep in foley and attempt to avoid removing as it will be difficult and traumatic to replace.   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 If 7PM-7AM, please contact night-coverage  No Charge

## 2024-02-01 NOTE — Assessment & Plan Note (Addendum)
 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

## 2024-02-01 NOTE — Plan of Care (Signed)

## 2024-02-01 NOTE — Hospital Course (Signed)
 Peter Robertson

## 2024-02-01 NOTE — Progress Notes (Signed)
 Coude catheter (49F) placed per order at this time. Pt tolerated well, urine returned. Marolyn, RN aware.

## 2024-02-01 NOTE — Plan of Care (Signed)
  Problem: Education: Goal: Individualized Educational Video(s) Outcome: Progressing   Problem: Fluid Volume: Goal: Ability to maintain a balanced intake and output will improve Outcome: Progressing   Problem: Metabolic: Goal: Ability to maintain appropriate glucose levels will improve Outcome: Progressing   Problem: Tissue Perfusion: Goal: Adequacy of tissue perfusion will improve Outcome: Progressing

## 2024-02-01 NOTE — TOC Initial Note (Signed)
 Transition of Care [facility]) - Initial/Assessment Note    Patient Details  Name: Peter Robertson MRN: [id number] Date of Birth: 07/09/1940  [facility] CM/SW Contact:    Dalia GORMAN Fuse, RN Phone Number: 02/01/2024, 9:20 AM  Clinical Narrative:                    Patient is from LTC at [facility] with Hospice. He admitted with AKI, hydronephresis, ? Urothelial carcinoma, and sarcodosis. TOC lvmm with Barnie at [facility] to confirm the patient can return to the facility at discharge.      Patient Goals and CMS Choice            Expected Discharge Plan and Services                                              Prior Living Arrangements/Services                       Activities of Daily Living      Permission Sought/Granted                  Emotional Assessment              Admission diagnosis:  Generalized abdominal pain [R10.84] DNI (do not intubate) [Z78.9] Bladder outlet obstruction [N32.0] Acute renal failure superimposed on chronic kidney disease, unspecified acute renal failure type, unspecified CKD stage (HCC) [N17.9, N18.9] Acute kidney injury superimposed on stage 4 chronic kidney disease (HCC) [N17.9, N18.4] Patient Active Problem List   Diagnosis Date Noted   Acute kidney injury superimposed on stage 4 chronic kidney disease (HCC) 02/01/2024   Bladder outlet obstruction, acute on chronic 02/01/2024   Bilateral hydronephrosis 02/01/2024   Abdominal pain 02/01/2024   Chronic anemia 02/01/2024   Paroxysmal atrial fibrillation (HCC) 02/01/2024   Hypotension 10/06/2023   Atrial fibrillation with rapid ventricular response (HCC) 10/06/2023   Hypokalemia 07/19/2023   Severe sepsis with septic shock (HCC) 07/18/2023   Myocardial injury 07/18/2023   Hyponatremia 07/18/2023   Hyperkalemia 07/18/2023   UTI (urinary tract infection) 06/27/2023   Type II diabetes mellitus with renal manifestations (HCC) 06/27/2023   Acute renal  failure superimposed on stage 3b chronic kidney disease (HCC) 06/27/2023   Sarcoidosis 06/27/2023   Sacral decubitus ulcer, stage III (HCC) 11/27/2022   Cellulitis of buttock 11/27/2022   Type 2 diabetes mellitus without complication, with long-term current use of insulin  (HCC) 11/27/2022   Protein-calorie malnutrition, severe 11/21/2022   Renal hematoma, left 11/19/2022   Chronic anticoagulation 11/18/2022   Infected decubitus ulcer 11/18/2022   Hydronephrosis of left kidney s/p nephrostomy tubes (4/2 - 10/03/2022) 11/18/2022   Frequent falls 11/18/2022   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 11/18/2022   Atrial fibrillation with RVR (HCC) 11/18/2022   Ureteropelvic junction (UPJ) obstruction 09/10/2022   (HFpEF) heart failure with preserved ejection fraction (HCC) 09/13/2021   Chronic diastolic (congestive) heart failure (HCC) 05/24/2021   Thyroid nodule greater than or equal to 1 cm in diameter incidentally noted on imaging study    Head injury    Weakness generalized    Acute on chronic diastolic CHF (congestive heart failure) (HCC) 05/07/2021   Acute respiratory failure with hypoxia (HCC) 05/07/2021   Aspiration pneumonia (HCC) 05/06/2021   Pneumonia due to COVID-19 virus 05/06/2021  Dementia without behavioral disturbance (HCC) 05/06/2021   Fall at home, initial encounter 05/06/2021   Hyperlipidemia    Hypertension    Elevated troponin    Chronic diastolic CHF (congestive heart failure) (HCC)    Depression    Stage 3a chronic kidney disease (HCC)    Thyroid nodule    Fall    Encounter for psychological evaluation 02/15/2020   AKI (acute kidney injury) (HCC) 02/12/2020   Vertigo 10/11/2019   BPPV (benign paroxysmal positional vertigo) 10/10/2019   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 [place] [address] Phone: [phone] Fax: [phone]     Social Drivers of Health (SDOH) Social History: SDOH Screenings   Food Insecurity: No Food Insecurity (02/01/2024)  Housing: Low Risk  (02/01/2024)  Transportation Needs: No Transportation Needs (02/01/2024)  Utilities: Not At Risk (02/01/2024)  Financial Resource Strain: Low Risk  (07/12/2022)   Received from [facility]  Physical Activity: Insufficiently Active (08/26/2020)   Received from [facility]  Social Connections: Moderately Isolated (10/06/2023)  Stress: Stress Concern Present (08/26/2020)   Received from [facility]  Tobacco Use: Medium Risk (10/05/2023)  Health Literacy: High Risk (08/19/2023)   Received from [facility]   SDOH Interventions:     Readmission Risk Interventions     No data to display

## 2024-02-01 NOTE — Assessment & Plan Note (Signed)
 Delirium precautions

## 2024-02-01 NOTE — Assessment & Plan Note (Deleted)
 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

## 2024-02-01 NOTE — Assessment & Plan Note (Signed)
 No acute issues.  Stable findings on CT

## 2024-02-01 NOTE — Progress Notes (Signed)
 Anticoagulation monitoring(Lovenox ):  83 yo male ordered Lovenox  40 mg Q24h    Filed Weights   01/31/24 1817 01/31/24 2250  Weight: 68.8 kg (151 lb 10.8 oz) 68.8 kg (151 lb 10.8 oz)   BMI 24.5   Lab Results  Component Value Date   CREATININE 4.23 (H) 01/31/2024   CREATININE 2.80 (H) 11/27/2023   CREATININE 3.00 (H) 10/07/2023   Estimated Creatinine Clearance: 11.7 mL/min (A) (by C-G formula based on SCr of 4.23 mg/dL (H)). Hemoglobin & Hematocrit     Component Value Date/Time   HGB 8.4 (L) 01/31/2024 1814   HGB 15.1 09/29/2013 0350   HCT 25.7 (L) 01/31/2024 1814   HCT 42.9 09/29/2013 0350     Per Protocol for Patient with estCrcl < 15 ml/min and BMI < 30, will transition to Heparin  5000 units SQ Q8H.

## 2024-02-01 NOTE — Assessment & Plan Note (Signed)
 Continue metoprolol.

## 2024-02-01 NOTE — ED Notes (Signed)
 Gave [name] Ottilie Wigglesworth update on Pt and transferring to a room

## 2024-02-01 NOTE — Assessment & Plan Note (Addendum)
 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

## 2024-02-01 NOTE — Assessment & Plan Note (Signed)
 Continue amiodarone , diltiazem  and metoprolol  Not on anticoagulation

## 2024-02-01 NOTE — Assessment & Plan Note (Signed)
 Clinically dry Monitor for fluid overload with IV fluid resuscitation

## 2024-02-01 NOTE — Assessment & Plan Note (Addendum)
 Suspect prerenal from vomiting as well as obstructive given hydronephrosis IV hydration Foley decompression Monitor renal function and avoid nephrotoxins Can consider urology consult manage pending further goals of care discussion with POA

## 2024-02-01 NOTE — Assessment & Plan Note (Signed)
 Secondary to AKI Lokelma  x 1 Expecting improvement with hydration Serial BMP

## 2024-02-01 NOTE — Assessment & Plan Note (Signed)
 At baseline

## 2024-02-02 ENCOUNTER — Encounter: Payer: Self-pay | Admitting: Student in an Organized Health Care Education/Training Program

## 2024-02-02 DIAGNOSIS — N179 Acute kidney failure, unspecified: Secondary | ICD-10-CM | POA: Diagnosis not present

## 2024-02-02 DIAGNOSIS — N184 Chronic kidney disease, stage 4 (severe): Secondary | ICD-10-CM | POA: Diagnosis not present

## 2024-02-02 LAB — BASIC METABOLIC PANEL WITH GFR
Anion gap: 3 — ABNORMAL LOW (ref 5–15)
BUN: 87 mg/dL — ABNORMAL HIGH (ref 8–23)
CO2: 21 mmol/L — ABNORMAL LOW (ref 22–32)
Calcium: 8.4 mg/dL — ABNORMAL LOW (ref 8.9–10.3)
Chloride: 111 mmol/L (ref 98–111)
Creatinine, Ser: 4.25 mg/dL — ABNORMAL HIGH (ref 0.61–1.24)
GFR, Estimated: 13 mL/min — ABNORMAL LOW (ref >60)
Glucose, Bld: 134 mg/dL — ABNORMAL HIGH (ref 70–99)
Potassium: 5.9 mmol/L — ABNORMAL HIGH (ref 3.5–5.1)
Sodium: 135 mmol/L (ref 135–145)

## 2024-02-02 LAB — CBC
HCT: 23 % — ABNORMAL LOW (ref 39.0–52.0)
Hemoglobin: 7.5 g/dL — ABNORMAL LOW (ref 13.0–17.0)
MCH: 28.6 pg (ref 26.0–34.0)
MCHC: 32.6 g/dL (ref 30.0–36.0)
MCV: 87.8 fL (ref 80.0–100.0)
Platelets: 71 K/uL — ABNORMAL LOW (ref 150–400)
RBC: 2.62 MIL/uL — ABNORMAL LOW (ref 4.22–5.81)
RDW: 16.7 % — ABNORMAL HIGH (ref 11.5–15.5)
WBC: 5.6 K/uL (ref 4.0–10.5)
nRBC: 0 % (ref 0.0–0.2)

## 2024-02-02 LAB — URINE CULTURE: Culture: NO GROWTH

## 2024-02-02 LAB — GLUCOSE, CAPILLARY
Glucose-Capillary: 127 mg/dL — ABNORMAL HIGH (ref 70–99)
Glucose-Capillary: 161 mg/dL — ABNORMAL HIGH (ref 70–99)
Glucose-Capillary: 142 mg/dL — ABNORMAL HIGH (ref 70–99)

## 2024-02-02 MED ORDER — SODIUM CHLORIDE 0.9 % IV SOLN: INTRAVENOUS | Status: DC

## 2024-02-02 MED ORDER — INSULIN ASPART 100 UNIT/ML IJ SOLN
5.0000 [IU] | Freq: Once | INTRAMUSCULAR | Status: AC
  Administered 2024-02-02: 5 [IU] via SUBCUTANEOUS
  Filled 2024-02-02: qty 1

## 2024-02-02 MED ORDER — ALBUTEROL SULFATE (2.5 MG/3ML) 0.083% IN NEBU
2.5000 mg | INHALATION_SOLUTION | RESPIRATORY_TRACT | Status: AC
  Administered 2024-02-02 (×2): 2.5 mg via RESPIRATORY_TRACT
  Filled 2024-02-02 (×2): qty 3

## 2024-02-02 MED ORDER — DEXTROSE-SODIUM CHLORIDE 5-0.9 % IV SOLN: INTRAVENOUS | Status: AC

## 2024-02-02 MED ORDER — METOCLOPRAMIDE HCL 5 MG/ML IJ SOLN
5.0000 mg | Freq: Once | INTRAMUSCULAR | Status: AC
  Administered 2024-02-02: 5 mg via INTRAVENOUS
  Filled 2024-02-02: qty 2

## 2024-02-02 MED ORDER — SODIUM ZIRCONIUM CYCLOSILICATE 10 G PO PACK
10.0000 g | PACK | Freq: Three times a day (TID) | ORAL | Status: AC
  Administered 2024-02-02: 10 g via ORAL
  Filled 2024-02-02 (×3): qty 1

## 2024-02-02 NOTE — Plan of Care (Signed)
   Problem: Education: Goal: Individualized Educational Video(s) Outcome: Progressing   Problem: Fluid Volume: Goal: Ability to maintain a balanced intake and output will improve Outcome: Progressing   Problem: Nutritional: Goal: Maintenance of adequate nutrition will improve Outcome: Progressing   Problem: Skin Integrity: Goal: Risk for impaired skin integrity will decrease Outcome: Progressing

## 2024-02-02 NOTE — Progress Notes (Signed)
 PROGRESS NOTE Peter Robertson    DOB: 1940/11/24, 83 y.o.  FMW:[id number]    Code Status: Limited: Do not attempt resuscitation (DNR) -DNR-LIMITED -Do Not Intubate/DNI    DOA: 01/31/2024   LOS: 1  Brief hospital course  Peter Robertson is a 83 y.o. 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  Patient reportedly has been having abdominal pain that has been increasing over the past 2 weeks associated with nausea and occasional vomiting and in the past 24 hours has had almost no oral intake.   In the ED, vitals within normal limits Labs notable for AKI with creatinine of 4.23 up from baseline of around 2.85 with potassium of 5.7 and bicarb of 20.  WBC was normal at 5.9.  Hemoglobin at baseline at 8.4.  Glucose elevated at 231.Lipase and LFTs unremarkable CT chest ,abdomen and pelvis: negative acute changes, moderate bilateral hydronephrosis and bladder wall thickening concerning for urothelial carcinoma,chronic pancreatitis.  Patient treated with an LR bolus and Protonix .  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)   Hyperkalemia   Chronic diastolic CHF (congestive heart failure) (HCC)   Paroxysmal atrial fibrillation (HCC)   Type II diabetes mellitus with renal manifestations (HCC)   Sarcoidosis   Dementia without behavioral disturbance (HCC)   BPH (benign prostatic  hyperplasia)   Ureteropelvic junction (UPJ) obstruction   Hydronephrosis of left kidney s/p nephrostomy tubes (4/2 - 10/03/2022)   Bladder outlet obstruction, acute on chronic   Chronic anemia   Acute urinary retention   Pressure injury of skin  Acute kidney injury superimposed on stage 4 chronic kidney disease (HCC) Suspect prerenal from vomiting as well as obstructive given hydronephrosis. Cr essentially stable x3 days around 4.2 - continue IV hydration - continue foley Foley decompression Monitor renal function and avoid nephrotoxins Can consider urology consult manage pending further goals of care discussion with POA  UTI- cultures are pending - continue on empiric Abx IV - supportive care    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 April 2024 Acute on chronic bladder outlet obstruction/BPH CT chest ,abdomen and pelvis: negative acute changes, moderate bilateral hydronephrosis and bladder wall thickening concerning for urothelial carcinoma - continue foley.  - would require definitive dx likely in cystoscopy with urology but after confirming GOC with HCPOA who patient designates to make his medical decisions, they are not interested in pursuing diagnosis and treatments that would be invasive. They do want to continue IV fluids and antibiotics.  - will follow for improvement, suspect he will indefinitely need foley catheter and consider comfort care   Type 2 diabetes mellitus with hyperglycemia, with long-term current use of insulin  (HCC)- not requiring insulin  on sliding scale so discontinued.  - continue to monitor CBGs with poor PO intake. May need to restart insulin  if PO increases   Hyperkalemia- remains elevated. K+ 5.9 today. He is unable to tolerate lokelma  due  to nausea. Would not be HD candidate.  - giving d5 IVF with insulin  as well as albuterol  to help shift intracellular - antiemetics recommended to tolerate PO lokelma  and if  unable to take PO, can transition to rectal administration.  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  Stable findings on CT    Dementia without behavioral disturbance (HCC) Delirium precautions   Chronic anemia At baseline  Body mass index is 22.63 kg/m.  VTE ppx: heparin  injection 5,000 Units Start: 02/01/24 0600  Diet:     Diet   Diet regular Fluid consistency: Thin   Consultants: None   Subjective 02/02/24    Pt reports no complaints. He denies abdominal pain currently. He falls asleep easily and unable to carry on conversation    Objective  Blood pressure (!) 104/56, pulse 66, temperature 98.7 F (37.1 C), resp. rate 16, height 5' 6 (1.676 m), weight 63.6 kg, SpO2 100%.  Intake/Output Summary (Last 24 hours) at 02/02/2024 0722 Last data filed at 02/02/2024 0537 Gross per 24 hour  Intake 1594.49 ml  Output 850 ml  Net 744.49 ml   Filed Weights   01/31/24 1817 01/31/24 2250 02/01/24 0309  Weight: 68.8 kg 68.8 kg 63.6 kg    Physical Exam:  General: awake briefly to voice, alert, NAD HEENT: atraumatic, clear conjunctiva, anicteric sclera, MMM, hard of hearing Respiratory: normal respiratory effort. Gastrointestinal: soft, NT, ND Nervous: Alert to voice Extremities: moves all equally, no edema, normal tone Skin: dry, intact, normal temperature, normal color. No rashes, lesions or ulcers on exposed skin  Labs   I have personally reviewed the following labs and imaging studies CBC    Component Value Date/Time   WBC 5.6 02/02/2024 0449   RBC 2.62 (L) 02/02/2024 0449   HGB 7.5 (L) 02/02/2024 0449   HGB 15.1 09/29/2013 0350    HCT 23.0 (L) 02/02/2024 0449   HCT 42.9 09/29/2013 0350   PLT 71 (L) 02/02/2024 0449   PLT 127 (L) 09/29/2013 0350   MCV 87.8 02/02/2024 0449   MCV 83 09/29/2013 0350   MCH 28.6 02/02/2024 0449   MCHC 32.6 02/02/2024 0449   RDW 16.7 (H) 02/02/2024 0449   RDW 15.2 (H) 09/29/2013 0350   LYMPHSABS 0.5 (L) 10/07/2023 0815   LYMPHSABS 1.0 09/29/2013 0350   MONOABS 0.7 10/07/2023 0815   MONOABS 0.8 09/29/2013 0350   EOSABS 0.2 10/07/2023 0815   EOSABS 0.2 09/29/2013 0350   BASOSABS 0.0 10/07/2023 0815   BASOSABS 0.1 09/29/2013 0350      Latest Ref Rng & Units 02/02/2024    4:49 AM 02/01/2024    4:33 AM 01/31/2024    6:14 PM  BMP  Glucose 70 - 99 mg/dL 865  837  768   BUN 8 - 23 mg/dL 87  88  87   Creatinine 0.61 - 1.24 mg/dL 5.74  5.83  5.76   Sodium 135 - 145 mmol/L 135  136  132   Potassium 3.5 - 5.1 mmol/L 5.9  5.8  5.7   Chloride 98 - 111 mmol/L 111  110  105   CO2 22 - 32 mmol/L 21  21  20    Calcium  8.9 - 10.3 mg/dL  8.4  8.8  8.7     CT CHEST ABDOMEN PELVIS WO CONTRAST Result Date: 02/01/2024 CLINICAL DATA:  Cough, abdominal pain, nausea, vomiting EXAM: CT CHEST, ABDOMEN AND PELVIS WITHOUT CONTRAST TECHNIQUE: Multidetector CT imaging of the chest, abdomen and pelvis was performed following the standard protocol without IV contrast. RADIATION DOSE REDUCTION: This exam was performed according to the departmental dose-optimization program which includes automated exposure control, adjustment of the mA and/or kV according to patient size and/or use of iterative reconstruction technique. COMPARISON:  07/18/2023 FINDINGS: CT CHEST FINDINGS Cardiovascular: Aortic valvular calcification. Status post coronary artery bypass grafting. Global cardiac size iswithin normal limits. No pericardial effusion. Central pulmonary arteries are of normal caliber. Mild atherosclerotic calcification within the thoracic aorta. No aortic aneurysm. Mediastinum/Nodes: 16 mm nodule within the right thyroid gland  is decreased in size since remote prior sonogram of 05/16/2021. No follow-up imaging is recommended. There is extensive calcified mediastinal adenopathy in keeping with changes of underlying sarcoidosis. The esophagus is unremarkable. Lungs/Pleura: There is architectural distortion, masslike consolidation, and parenchymal calcification within the lung apices and within the subpleural regions at the lung bases and along major fissures in keeping with changes pulmonary sarcoidosis. The changes appear minimally progressed since remote prior examination of 05/21/2014. No active inflammatory infiltrates are identified. No pneumothorax or pleural effusion. No central obstructing lesion. Musculoskeletal: No acute bone abnormality. No lytic or blastic bone lesion CT ABDOMEN PELVIS FINDINGS Hepatobiliary: No focal liver abnormality is seen. No gallstones, gallbladder wall thickening, or biliary dilatation. Pancreas: Parenchymal calcifications within the head and uncinate process of the pancreas are in keeping with changes of chronic pancreatitis. No superimposed peripancreatic inflammatory changes. Spleen: Mild splenomegaly noted with the spleen measuring 14.6 cm in greatest dimension, similar prior examination. No intrasplenic lesion seen. Adrenals/Urinary Tract: The adrenal glands are unremarkable. The kidneys are normal in size and position. Moderate bilateral hydronephrosis and hydroureter to the level the bladder. The bladder itself is relatively contracted demonstrates irregular wall thickening suspicious for a infiltrative intramural mass as can be seen with urothelial carcinoma. This appears progressive since prior examination. Stomach/Bowel: Stomach is within normal limits. Appendix appears normal. No evidence of bowel wall thickening, distention, or inflammatory changes. Vascular/Lymphatic: Aortic atherosclerosis. No enlarged abdominal or pelvic lymph nodes. Reproductive: Mild prostatic hypertrophy Other: Small  bilateral fat containing inguinal hernias Musculoskeletal: Stable remote L1 compression deformity. No acute bone abnormality. No lytic or blastic bone lesion. Osseous structures are age appropriate. IMPRESSION: 1. No acute intrathoracic or intra-abdominal pathology identified. 2. Moderate bilateral hydronephrosis and hydroureter to the level the bladder. The bladder itself is relatively contracted demonstrates irregular wall thickening suspicious for a infiltrative intramural mass as can be seen with urothelial carcinoma. The irregularity of wall thickening would argue against a more benign process such as chronic bladder outlet obstruction. This appears progressive since prior examination. Correlation with cystoscopy is recommended. 3. Changes of pulmonary sarcoidosis, minimally progressed since remote prior examination of 05/21/2014. 4. Aortic valvular calcification. Status post coronary artery bypass grafting. 5. Mild splenomegaly, similar to prior examination. 6. Changes of chronic pancreatitis. 7. Aortic atherosclerosis. Aortic Atherosclerosis (ICD10-I70.0). Electronically Signed   By: Dorethia Molt M.D.   On: 02/01/2024 00:52   Disposition Plan & Communication  Patient status: Inpatient  Admitted From: SNF Planned disposition location: Skilled nursing facility Anticipated discharge date: TBD pending clinical stability   Family Communication: caregiver on phone    Author: Marien LITTIE Piety, DO [facility] 02/02/2024, 7:22 AM   Available by  Epic secure chat 7AM-7PM. If 7PM-7AM, please contact night-coverage.  [facility] contact information found on [email or web address].

## 2024-02-02 NOTE — Progress Notes (Signed)
 [facility]  [facility] Hospitalized Hospice Patient   [name] is a current hospice patient followed at [facility] for terminal diagnosis of CVD.  Patient was sent to the ED by [facility] staff due to patient complaint of lower abdominal pain and decreased appetite.  Patient was admitted to [facility] on 8.15.2025 with diagnosis of AKI stage 4 CKD.  Per Dr. Norleen Laurence, hospice MD, this is a related hospitalization.  [name] is resting in bed today.  No complaints.  No family present at bedside.  Patient continues to receive IV antibiotics   [name] is appropriate for GIP level of care requiring skilled level of care to manage and monitor effectiviness of interventions- including IV hydration, IV antibiotics.    Vital Signs:   T 98.7 oral, BP 104/56, P 66, R16  Oxi 100% RA Height:  5'6 Weight:  140#   Abnormal Labs:   Glucose 134, BUN 87, Creat 4.25, K 5.9, C02 21, Ca 8.4   I&O:     Gross per 24 hour Intake    1594.4ml Output   Net        744.1ml    IV MEDS/PRN: Maxipime  1g IVPB every day Zofran  4mg  IV every 6 hours prn 2 doses @ 1226a & 853a on 8.26 Protonix  40mg  IV daily   Diagnostics:  No New    Assessment and Plan:   per Dr. 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)   Hyperkalemia   Chronic diastolic CHF (congestive heart failure) (HCC)   Paroxysmal atrial fibrillation (HCC)   Type II diabetes mellitus with renal manifestations (HCC)   Sarcoidosis   Dementia without behavioral disturbance (HCC)   BPH (benign prostatic hyperplasia)   Ureteropelvic junction (UPJ) obstruction   Hydronephrosis of left kidney s/p nephrostomy tubes (4/2 -  10/03/2022)   Bladder outlet obstruction, acute on chronic   Chronic anemia   Acute urinary retention   Pressure injury of skin   Acute kidney injury superimposed on stage 4 chronic kidney disease (HCC) Suspect prerenal from vomiting as well as obstructive given hydronephrosis. Cr essentially stable x3 days around 4.2 - continue IV hydration - continue foley Foley decompression Monitor renal function and avoid nephrotoxins Can consider urology consult manage pending further goals of care discussion with POA   UTI- cultures are pending - continue on empiric Abx IV - supportive care    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 April 2024 Acute on chronic bladder outlet obstruction/BPH CT chest ,abdomen and pelvis: negative acute changes, moderate bilateral hydronephrosis and bladder wall thickening concerning for urothelial carcinoma - continue foley.  - would require definitive dx likely in cystoscopy with urology but after confirming GOC with HCPOA who patient designates to make his medical decisions, they are not interested in pursuing diagnosis and treatments that would be invasive. They do want to continue IV fluids and antibiotics.  - will follow for improvement, suspect he will indefinitely need foley catheter and consider comfort care   Type 2 diabetes mellitus with hyperglycemia, with long-term current use of insulin  (HCC)- not requiring insulin  on sliding scale so discontinued.  - continue to monitor CBGs with poor PO  intake. May need to restart insulin  if PO increases   Hyperkalemia- remains elevated. K+ 5.9 today. He is unable to tolerate lokelma  due to nausea. Would not be HD candidate.  - giving d5 IVF with insulin  as well as albuterol  to help shift intracellular - antiemetics recommended to tolerate PO lokelma  and if unable to take PO, can transition to rectal administration.  Repeat BMP this afternoon    CAD s/p two-vessel CABG  2011 (coronary artery disease) Continue metoprolol    Paroxysmal atrial fibrillation (HCC) Continue amiodarone , diltiazem  and metoprolol  Not on anticoagulation   Chronic diastolic CHF (congestive heart failure) (HCC) Clinically dry Monitor for fluid overload with IV fluid resuscitation   Sarcoidosis No acute issues.  Stable findings on CT    Dementia without behavioral disturbance (HCC) Delirium precautions   Chronic anemia At baseline  ___________________________________  Discharge Plan:  ongoing- patient was admitted today.  Tentative plan per [name] is return to [facility]   Family- None present.  I called and left a message with PCG, [name].  [name] returned my call.  Discussed discharge disposition.  Likely will go back to LTC with continued hospice services.   IDT:  updated with ongoing collaboration/coordination between hospice IDT and hospital IDT   Goals of Care:  clear- DNR- comfort approach   Please call with any hospice related questions or concerns.   [name] Na, RN Nurse Liaison [phone]

## 2024-02-03 DIAGNOSIS — N179 Acute kidney failure, unspecified: Secondary | ICD-10-CM | POA: Diagnosis not present

## 2024-02-03 DIAGNOSIS — N184 Chronic kidney disease, stage 4 (severe): Secondary | ICD-10-CM | POA: Diagnosis not present

## 2024-02-03 LAB — BASIC METABOLIC PANEL WITH GFR
Anion gap: 4 — ABNORMAL LOW (ref 5–15)
BUN: 75 mg/dL — ABNORMAL HIGH (ref 8–23)
CO2: 21 mmol/L — ABNORMAL LOW (ref 22–32)
Calcium: 8.3 mg/dL — ABNORMAL LOW (ref 8.9–10.3)
Chloride: 111 mmol/L (ref 98–111)
Creatinine, Ser: 3.78 mg/dL — ABNORMAL HIGH (ref 0.61–1.24)
GFR, Estimated: 15 mL/min — ABNORMAL LOW (ref >60)
Glucose, Bld: 143 mg/dL — ABNORMAL HIGH (ref 70–99)
Potassium: 5.4 mmol/L — ABNORMAL HIGH (ref 3.5–5.1)
Sodium: 136 mmol/L (ref 135–145)

## 2024-02-03 LAB — GLUCOSE, CAPILLARY
Glucose-Capillary: 136 mg/dL — ABNORMAL HIGH (ref 70–99)
Glucose-Capillary: 146 mg/dL — ABNORMAL HIGH (ref 70–99)
Glucose-Capillary: 172 mg/dL — ABNORMAL HIGH (ref 70–99)
Glucose-Capillary: 141 mg/dL — ABNORMAL HIGH (ref 70–99)

## 2024-02-03 LAB — CBC
HCT: 22.1 % — ABNORMAL LOW (ref 39.0–52.0)
Hemoglobin: 7.2 g/dL — ABNORMAL LOW (ref 13.0–17.0)
MCH: 28.8 pg (ref 26.0–34.0)
MCHC: 32.6 g/dL (ref 30.0–36.0)
MCV: 88.4 fL (ref 80.0–100.0)
Platelets: 67 K/uL — ABNORMAL LOW (ref 150–400)
RBC: 2.5 MIL/uL — ABNORMAL LOW (ref 4.22–5.81)
RDW: 16.6 % — ABNORMAL HIGH (ref 11.5–15.5)
WBC: 4.8 K/uL (ref 4.0–10.5)
nRBC: 0 % (ref 0.0–0.2)

## 2024-02-03 MED ORDER — SODIUM ZIRCONIUM CYCLOSILICATE 10 G PO PACK
10.0000 g | PACK | Freq: Three times a day (TID) | ORAL | Status: AC
  Administered 2024-02-03 – 2024-02-04 (×3): 10 g via ORAL
  Filled 2024-02-03 (×4): qty 1

## 2024-02-03 MED ORDER — ALBUTEROL SULFATE (2.5 MG/3ML) 0.083% IN NEBU
2.5000 mg | INHALATION_SOLUTION | Freq: Four times a day (QID) | RESPIRATORY_TRACT | Status: DC
  Administered 2024-02-03: 2.5 mg via RESPIRATORY_TRACT
  Filled 2024-02-03: qty 3

## 2024-02-03 MED ORDER — ALBUTEROL SULFATE (2.5 MG/3ML) 0.083% IN NEBU: 2.5000 mg | INHALATION_SOLUTION | RESPIRATORY_TRACT | Status: DC | PRN

## 2024-02-03 NOTE — Plan of Care (Signed)
 Almarie Ireland, LPN

## 2024-02-03 NOTE — Progress Notes (Signed)
 [facility]  [facility] Patient   Peter Robertson is a current hospice patient followed at [facility] for terminal diagnosis of CVD.  Patient was sent to the ED by [facility] staff due to patient complaint of lower abdominal pain and decreased appetite.  Patient was admitted to [facility] on 8.15.2025 with diagnosis of AKI stage 4 CKD.  Per Dr. Norleen Laurence, hospice MD, this is a related hospitalization.   Patient awake and watching TV.  No family present.  He denies any pain or any issues.  Per MD, patient will likely discharge back to [facility] tomorrow.  I called to speak with Peter Robertson and let her know of tentative discharge plan.  She was asking many questions about patient's prognosis and next steps.  Disease progression discussed with results of CT abdomen/pelvis showing a concern of Urothelial cancer.  She was asking questions about continued hospitalizations and treatment.  I discussed hospice philosophy and briefly discussed about treating symptoms and not the disease process/progression.  That is her main priority as it is Peter Robertson.  She would like to discuss his plan of care more with the hospice team, Peter Robertson and the [facility] staff to see about managing his pain, treating the treatable and keeping him at the facility.  Report passed along to hospice IDT.   Peter Robertson is appropriate for GIP level of care requiring skilled level of care to manage and monitor effectiviness of interventions- IV antibiotics.    Vital Signs:   T 99.5 oral, BP 129/54, P 76, R18  Oxi 100% 2L/[place]   Abnormal Labs:   K 5.4, C02 21, glucose 143, BUN 75, Creat 3.78, Ca 8.3, Anion gap 4, GFR 15, RBC 2.5, HGB 7.2, HCT 22.1, RDW 16.6, Platelets 67   I&O:      Intake  Cum 1234.81ml Output - not recorded   IV MEDS/PRN: Maxipime  1g IVPB every day Zofran  4mg  IV every 6 hours prn Protonix  40mg  IV daily   Diagnostics:  No New    Assessment and Plan:   per Dr. 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)   Hyperkalemia   Chronic diastolic CHF (congestive heart failure) (HCC)   Paroxysmal atrial fibrillation (HCC)   Type II diabetes mellitus with renal manifestations (HCC)   Sarcoidosis   Dementia without behavioral disturbance (HCC)   BPH (benign prostatic hyperplasia)   Ureteropelvic junction (UPJ) obstruction   Hydronephrosis of left kidney s/p nephrostomy tubes (4/2 - 10/03/2022)   Bladder outlet obstruction, acute on chronic   Chronic anemia   Acute urinary retention   Pressure injury of skin   Acute kidney injury superimposed on stage 4 chronic kidney disease (HCC) Suspect prerenal from vomiting as well as obstructive given hydronephrosis. Cr essentially stable x3 days around 4.2>3.78 today - continue IV hydration - continue Foley decompression Monitor renal function and avoid nephrotoxins Can consider urology consult manage pending further goals of care discussion with POA   UTI- cultures showing NGTD - continue on empiric Abx IV - supportive care    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 April 2024 Acute on chronic bladder outlet obstruction/BPH CT chest ,abdomen and pelvis: negative  acute changes, moderate bilateral hydronephrosis and bladder wall thickening concerning for urothelial carcinoma - continue foley.  - would require definitive dx likely in cystoscopy with urology but after confirming GOC with HCPOA who patient designates to make his medical decisions, they are not interested in pursuing diagnosis and treatments that would be invasive. They do want to continue IV fluids and antibiotics.  - will  follow for improvement, suspect he will indefinitely need foley catheter and consider comfort care   Type 2 diabetes mellitus with hyperglycemia, with long-term current use of insulin  (HCC)- not requiring insulin  on sliding scale so discontinued.  - continue to monitor CBGs with poor PO intake. May need to restart insulin  if PO increases   Hyperkalemia- remains elevated. K+ 5.9>5.4 today. He is intermittently unable to tolerate lokelma  due to nausea. Would not be HD candidate.  - giving d5 IVF with insulin  as well as albuterol  to help shift intracellular - antiemetics recommended to tolerate PO lokelma  and if unable to take PO, can transition to rectal administration.    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  Stable findings on CT    Dementia without behavioral disturbance (HCC) Delirium precautions   Chronic anemia At baseline     ___________________________________   Discharge Plan:  ongoing- Patient will likely discharge back to [facility] tomorrow.   Family- None present.  I updated Peter Robertson that likely d/c plan was tomorrow back to [facility].   IDT:  updated with ongoing collaboration/coordination between hospice IDT and hospital IDT   Goals of Care:  clear- DNR- comfort approach   Please call with any hospice related questions or concerns.   [name] Na, RN Nurse Liaison [phone]

## 2024-02-03 NOTE — Progress Notes (Signed)
 PHARMACY CONSULT NOTE - ELECTROLYTES  Pharmacy Consult for Electrolyte Monitoring and Replacement   Recent Labs: Height: 5' 6 (167.6 cm) Weight: 63.6 kg (140 lb 3.4 oz) IBW/kg (Calculated) : 63.8 Estimated Creatinine Clearance: 13.1 mL/min (A) (by C-G formula based on SCr of 3.78 mg/dL (H)). Potassium (mmol/L)  Date Value  02/03/2024 5.4 (H)  09/29/2013 4.7   Magnesium  (mg/dL)  Date Value  98/70/7974 1.9  09/27/2013 1.8   Calcium  (mg/dL)  Date Value  91/82/7974 8.3 (L)   Calcium , Total (mg/dL)  Date Value  95/86/7984 9.2   Albumin  (g/dL)  Date Value  91/85/7974 3.2 (L)   Phosphorus (mg/dL)  Date Value  98/70/7974 4.1   Sodium (mmol/L)  Date Value  02/03/2024 136  09/29/2013 135 (L)    Assessment  Peter Robertson is a 83 y.o. male presenting with AKI on CKD and UTI. PMH significant for HTN, DM, Asthma, CAD, CKD4, dementia, dCHF, and A. Fib. Pharmacy has been consulted to monitor and replace electrolytes.  Diet: Regular, thin MIVF: D5NS @ 50 mL/hr Pertinent medications: N/A  Goal of Therapy: Electrolytes WNL  Plan:  Lokelma  10g TID ordered No additional orders needed at this time Check BMP, Mg, Phos with AM labs  Thank you for allowing pharmacy to be a part of this patient's care.  Estill CHRISTELLA Lutes, PharmD, BCPS Clinical Pharmacist 02/03/2024 12:34 PM

## 2024-02-03 NOTE — Progress Notes (Signed)
 PROGRESS NOTE Peter Robertson    DOB: 1941-05-09, 83 y.o.  FMW:[id number]    Code Status: Limited: Do not attempt resuscitation (DNR) -DNR-LIMITED -Do Not Intubate/DNI    DOA: 01/31/2024   LOS: 2  Brief hospital course  Peter Robertson is a 83 y.o. 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  Patient reportedly has been having abdominal pain that has been increasing over the past 2 weeks associated with nausea and occasional vomiting and in the past 24 hours has had almost no oral intake.   In the ED, vitals within normal limits Labs notable for AKI with creatinine of 4.23 up from baseline of around 2.85 with potassium of 5.7 and bicarb of 20.  WBC was normal at 5.9.  Hemoglobin at baseline at 8.4.  Glucose elevated at 231.Lipase and LFTs unremarkable CT chest ,abdomen and pelvis: negative acute changes, moderate bilateral hydronephrosis and bladder wall thickening concerning for urothelial carcinoma,chronic pancreatitis.  Patient treated with an LR bolus and Protonix .  02/03/24 -stable. Since not pursuing workup, cal likely dc back to facility tomorrow. UOP is stable and pain has resolved. Foley will have to remain in place.   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)   Hyperkalemia   Chronic diastolic CHF (congestive heart failure) (HCC)   Paroxysmal atrial fibrillation (HCC)   Type II  diabetes mellitus with renal manifestations (HCC)   Sarcoidosis   Dementia without behavioral disturbance (HCC)   BPH (benign prostatic hyperplasia)   Ureteropelvic junction (UPJ) obstruction   Hydronephrosis of left kidney s/p nephrostomy tubes (4/2 - 10/03/2022)   Bladder outlet obstruction, acute on chronic   Chronic anemia   Acute urinary retention   Pressure injury of skin  Acute kidney injury superimposed on stage 4 chronic kidney disease (HCC) Suspect prerenal from vomiting as well as obstructive given hydronephrosis. Cr essentially stable x3 days around 4.2>3.78 today - continue IV hydration - continue Foley decompression Monitor renal function and avoid nephrotoxins Can consider urology consult manage pending further goals of care discussion with POA  UTI- cultures showing NGTD - continue on empiric Abx IV - supportive care    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 April 2024 Acute on chronic bladder outlet obstruction/BPH CT chest ,abdomen and pelvis: negative acute changes, moderate bilateral hydronephrosis and bladder wall thickening concerning for urothelial carcinoma - continue foley.  - would require definitive dx likely in cystoscopy with urology but after confirming GOC with HCPOA who patient designates to make his medical decisions, they are not interested in pursuing diagnosis and treatments that would be invasive. They do want to continue IV fluids and antibiotics.  - will follow for improvement, suspect he will indefinitely need foley catheter and consider comfort care   Type 2 diabetes mellitus with hyperglycemia, with long-term current use of insulin  (HCC)- not requiring insulin  on sliding scale so discontinued.  - continue to monitor CBGs with  poor PO intake. May need to restart insulin  if PO increases   Hyperkalemia- remains elevated. K+ 5.9>5.4 today. He is intermittently unable to tolerate lokelma  due to nausea.  Would not be HD candidate.  - giving d5 IVF with insulin  as well as albuterol  to help shift intracellular - antiemetics recommended to tolerate PO lokelma  and if unable to take PO, can transition to rectal administration.    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  Stable findings on CT    Dementia without behavioral disturbance (HCC) Delirium precautions   Chronic anemia At baseline  Body mass index is 22.63 kg/m.  VTE ppx: heparin  injection 5,000 Units Start: 02/01/24 0600  Diet:     Diet   Diet regular Fluid consistency: Thin   Consultants: None   Subjective 02/03/24    Pt reports no complaints. He denies abdominal pain currently. He falls asleep easily and unable to carry on conversation    Objective  Blood pressure (!) 104/56, pulse 66, temperature 98.7 F (37.1 C), resp. rate 16, height 5' 6 (1.676 m), weight 63.6 kg, SpO2 100%.  Intake/Output Summary (Last 24 hours) at 02/03/2024 0720 Last data filed at 02/03/2024 9362 Gross per 24 hour  Intake 1555.76 ml  Output 800 ml  Net 755.76 ml   Filed Weights   01/31/24 1817 01/31/24 2250 02/01/24 0309  Weight: 68.8 kg 68.8 kg 63.6 kg    Physical Exam:  General: awake briefly to voice, alert, NAD HEENT: atraumatic, clear conjunctiva, anicteric sclera, MMM, hard of hearing Respiratory: normal respiratory effort. Gastrointestinal: soft, NT, ND Nervous: Alert to voice Extremities: moves all equally, no edema, normal tone Skin: dry, intact, normal temperature, normal color. No rashes, lesions or ulcers on exposed skin  Labs   I have personally reviewed the following labs and imaging studies CBC    Component  Value Date/Time   WBC 4.8 02/03/2024 0532   RBC 2.50 (L) 02/03/2024 0532   HGB 7.2 (L) 02/03/2024 0532   HGB 15.1 09/29/2013 0350   HCT 22.1 (L) 02/03/2024 0532   HCT 42.9 09/29/2013 0350   PLT 67 (L) 02/03/2024 0532   PLT 127 (L) 09/29/2013 0350   MCV 88.4 02/03/2024 0532   MCV 83 09/29/2013 0350   MCH 28.8 02/03/2024 0532   MCHC 32.6 02/03/2024 0532   RDW 16.6 (H) 02/03/2024 0532   RDW 15.2 (H) 09/29/2013 0350   LYMPHSABS 0.5 (L) 10/07/2023 0815   LYMPHSABS 1.0 09/29/2013 0350   MONOABS 0.7 10/07/2023 0815   MONOABS 0.8 09/29/2013 0350   EOSABS 0.2 10/07/2023 0815   EOSABS 0.2 09/29/2013 0350   BASOSABS 0.0 10/07/2023 0815   BASOSABS 0.1 09/29/2013 0350      Latest Ref Rng & Units 02/03/2024    5:32 AM 02/02/2024    4:49 AM 02/01/2024    4:33 AM  BMP  Glucose 70 - 99 mg/dL 856  865  837   BUN 8 - 23 mg/dL 75  87  88   Creatinine 0.61 - 1.24 mg/dL 6.21  5.74  5.83   Sodium 135 - 145 mmol/L 136  135  136   Potassium 3.5 - 5.1 mmol/L 5.4  5.9  5.8   Chloride 98 - 111 mmol/L 111  111  110   CO2 22 - 32 mmol/L 21  21  21    Calcium  8.9 - 10.3 mg/dL 8.3  8.4  8.8     No results found.  Disposition Plan & Communication  Patient status: Inpatient  Admitted From: SNF Planned disposition location: Skilled nursing facility, hospice Anticipated discharge date: TBD pending clinical stability   Family Communication: caregiver on phone    Author: Marien LITTIE Piety, DO [facility] 02/03/2024, 7:20 AM   Available by Epic secure chat 7AM-7PM. If 7PM-7AM, please contact night-coverage.  [facility] contact information found on [email or web address].

## 2024-02-03 NOTE — Plan of Care (Signed)
  Problem: Education: Goal: Ability to describe self-care measures that may prevent or decrease complications (Diabetes Survival Skills Education) will improve 02/03/2024 1607 by Brandee Markin, RN Outcome: Progressing 02/03/2024 1607 by Doni Widmer, RN Outcome: Progressing Goal: Individualized Educational Video(s) 02/03/2024 1607 by Murtaza Shell, RN Outcome: Progressing 02/03/2024 1607 by Shirleyann Hutching, RN Outcome: Progressing   Problem: Coping: Goal: Ability to adjust to condition or change in health will improve 02/03/2024 1607 by Lashara Urey, RN Outcome: Progressing 02/03/2024 1607 by Shirleyann Hutching, RN Outcome: Progressing   Problem: Health Behavior/Discharge Planning: Goal: Ability to identify and utilize available resources and services will improve 02/03/2024 1607 by Shirleyann Hutching, RN Outcome: Progressing 02/03/2024 1607 by Deloise Marchant, RN Outcome: Progressing Goal: Ability to manage health-related needs will improve 02/03/2024 1607 by Steward Sames, RN Outcome: Progressing 02/03/2024 1607 by Haylee Mcanany, RN Outcome: Progressing   Problem: Metabolic: Goal: Ability to maintain appropriate glucose levels will improve 02/03/2024 1607 by Zamya Culhane, RN Outcome: Progressing 02/03/2024 1607 by Jourdon Zimmerle, RN Outcome: Progressing   Problem: Nutritional: Goal: Maintenance of adequate nutrition will improve 02/03/2024 1607 by Jered Heiny, RN Outcome: Progressing 02/03/2024 1607 by Alvy Alsop, RN Outcome: Progressing Goal: Progress toward achieving an optimal weight will improve 02/03/2024 1607 by Adriauna Campton, RN Outcome: Progressing 02/03/2024 1607 by Abir Eroh, RN Outcome: Progressing   Problem: Tissue Perfusion: Goal: Adequacy of tissue perfusion will improve 02/03/2024 1607 by Manjot Hinks, RN Outcome: Progressing 02/03/2024 1607 by Tondalaya Perren, RN Outcome: Progressing   Problem: Education: Goal: Knowledge of  General Education information will improve Description: Including pain rating scale, medication(s)/side effects and non-pharmacologic comfort measures 02/03/2024 1607 by Jaclynne Baldo, RN Outcome: Progressing 02/03/2024 1607 by Patryk Conant, RN Outcome: Progressing   Problem: Clinical Measurements: Goal: Ability to maintain clinical measurements within normal limits will improve 02/03/2024 1607 by Geoge Lawrance, RN Outcome: Progressing 02/03/2024 1607 by Mehek Grega, RN Outcome: Progressing Goal: Will remain free from infection Outcome: Progressing Goal: Diagnostic test results will improve Outcome: Progressing Goal: Respiratory complications will improve Outcome: Progressing Goal: Cardiovascular complication will be avoided Outcome: Progressing

## 2024-02-04 DIAGNOSIS — N179 Acute kidney failure, unspecified: Secondary | ICD-10-CM | POA: Diagnosis not present

## 2024-02-04 DIAGNOSIS — N184 Chronic kidney disease, stage 4 (severe): Secondary | ICD-10-CM | POA: Diagnosis not present

## 2024-02-04 LAB — BASIC METABOLIC PANEL WITH GFR
Anion gap: 7 (ref 5–15)
BUN: 70 mg/dL — ABNORMAL HIGH (ref 8–23)
CO2: 21 mmol/L — ABNORMAL LOW (ref 22–32)
Calcium: 8.4 mg/dL — ABNORMAL LOW (ref 8.9–10.3)
Chloride: 109 mmol/L (ref 98–111)
Creatinine, Ser: 3.47 mg/dL — ABNORMAL HIGH (ref 0.61–1.24)
GFR, Estimated: 17 mL/min — ABNORMAL LOW (ref >60)
Glucose, Bld: 147 mg/dL — ABNORMAL HIGH (ref 70–99)
Potassium: 5.1 mmol/L (ref 3.5–5.1)
Sodium: 137 mmol/L (ref 135–145)

## 2024-02-04 LAB — CBC
HCT: 22.2 % — ABNORMAL LOW (ref 39.0–52.0)
Hemoglobin: 7.2 g/dL — ABNORMAL LOW (ref 13.0–17.0)
MCH: 28.5 pg (ref 26.0–34.0)
MCHC: 32.4 g/dL (ref 30.0–36.0)
MCV: 87.7 fL (ref 80.0–100.0)
Platelets: 68 K/uL — ABNORMAL LOW (ref 150–400)
RBC: 2.53 MIL/uL — ABNORMAL LOW (ref 4.22–5.81)
RDW: 16.4 % — ABNORMAL HIGH (ref 11.5–15.5)
WBC: 4.3 K/uL (ref 4.0–10.5)
nRBC: 0 % (ref 0.0–0.2)

## 2024-02-04 LAB — GLUCOSE, CAPILLARY
Glucose-Capillary: 132 mg/dL — ABNORMAL HIGH (ref 70–99)
Glucose-Capillary: 125 mg/dL — ABNORMAL HIGH (ref 70–99)
Glucose-Capillary: 143 mg/dL — ABNORMAL HIGH (ref 70–99)

## 2024-02-04 LAB — PHOSPHORUS: Phosphorus: 2.9 mg/dL (ref 2.5–4.6)

## 2024-02-04 LAB — MAGNESIUM: Magnesium: 1.9 mg/dL (ref 1.7–2.4)

## 2024-02-04 MED ORDER — CEPHALEXIN 500 MG PO CAPS: 500.0000 mg | ORAL_CAPSULE | Freq: Two times a day (BID) | ORAL | Status: AC

## 2024-02-04 MED ORDER — ACETAMINOPHEN 325 MG PO TABS: 650.0000 mg | ORAL_TABLET | Freq: Four times a day (QID) | ORAL | Status: DC | PRN

## 2024-02-04 NOTE — Plan of Care (Signed)
 Peter Ireland, LPN

## 2024-02-04 NOTE — TOC Transition Note (Signed)
 Transition of Care [facility]) - Discharge Note   Patient Details  Name: Peter Robertson MRN: [id number] Date of Birth: 1941-04-28  [facility] CM/SW Contact:  [place] L Azariyah Luhrs, LCSW Phone Number: 02/04/2024, 11:32 AM   Clinical Narrative:     CSW spoke with Barnie at [facility]. Patient able to return today. Discharge Summary in. Medical Team notified. No family listed on chart.   Transportation set up with [facility]. TOC signing off.     Barriers to Discharge: No Barriers Identified   Patient Goals and CMS Choice   CMS Medicare.gov Compare Post Acute Care list provided to:: Patient Choice offered to / list presented to : Patient [place] ownership interest in [facility].provided to:: Patient    Discharge Placement              Patient chooses bed at: [facility] Patient to be transferred to facility by: [facility] Name of family member notified: No family on record Patient and family notified of of transfer: 02/04/24  Discharge Plan and Services Additional resources added to the After Visit Summary for                                       Social Drivers of Health (SDOH) Interventions SDOH Screenings   Food Insecurity: No Food Insecurity (02/01/2024)  Housing: Low Risk  (02/01/2024)  Transportation Needs: No Transportation Needs (02/01/2024)  Utilities: Not At Risk (02/01/2024)  Financial Resource Strain: Low Risk  (07/12/2022)   Received from [facility]  Physical Activity: Insufficiently Active (08/26/2020)   Received from [facility]  Social Connections: Moderately Isolated (10/06/2023)  Stress: Stress Concern Present (08/26/2020)   Received from [facility]  Tobacco Use: Medium Risk (02/02/2024)  Health Literacy: High Risk (08/19/2023)   Received from [facility]     Readmission Risk Interventions     No data to display

## 2024-02-04 NOTE — Care Management Important Message (Signed)
 Important Message  Patient Details  Name: Peter Robertson MRN: [id number] Date of Birth: [birth year]   Important Message Given:  Yes - Medicare IM     [name] 02/04/2024, 2:27 PM

## 2024-02-04 NOTE — Progress Notes (Incomplete)
 PROGRESS NOTE JOVANNE RIGGENBACH    DOB: Sep 23, 1940, 83 y.o.  FMW:[id number]    Code Status: Limited: Do not attempt resuscitation (DNR) -DNR-LIMITED -Do Not Intubate/DNI    DOA: 01/31/2024   LOS: 3  Brief hospital course  Peter Robertson is a 83 y.o. 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  Patient reportedly has been having abdominal pain that has been increasing over the past 2 weeks associated with nausea and occasional vomiting and in the past 24 hours has had almost no oral intake.   In the ED, vitals within normal limits Labs notable for AKI with creatinine of 4.23 up from baseline of around 2.85 with potassium of 5.7 and bicarb of 20.  WBC was normal at 5.9.  Hemoglobin at baseline at 8.4.  Glucose elevated at 231.Lipase and LFTs unremarkable CT chest ,abdomen and pelvis: negative acute changes, moderate bilateral hydronephrosis and bladder wall thickening concerning for urothelial carcinoma,chronic pancreatitis.  Patient treated with an LR bolus and Protonix .  02/04/24 -stable. Since not pursuing workup, cal likely dc back to facility tomorrow. UOP is stable and pain has resolved. Foley will have to remain in place.   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)   Hyperkalemia   Chronic diastolic CHF (congestive heart failure) (HCC)   Paroxysmal atrial fibrillation (HCC)   Type II  diabetes mellitus with renal manifestations (HCC)   Sarcoidosis   Dementia without behavioral disturbance (HCC)   BPH (benign prostatic hyperplasia)   Ureteropelvic junction (UPJ) obstruction   Hydronephrosis of left kidney s/p nephrostomy tubes (4/2 - 10/03/2022)   Bladder outlet obstruction, acute on chronic   Chronic anemia   Acute urinary retention   Pressure injury of skin  Acute kidney injury superimposed on stage 4 chronic kidney disease (HCC) Suspect prerenal from vomiting as well as obstructive given hydronephrosis. Cr essentially stable x3 days around 4.2>3.78 today - continue IV hydration - continue Foley decompression Monitor renal function and avoid nephrotoxins Can consider urology consult manage pending further goals of care discussion with POA  UTI- cultures showing NGTD - continue on empiric Abx IV - supportive care    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 April 2024 Acute on chronic bladder outlet obstruction/BPH CT chest ,abdomen and pelvis: negative acute changes, moderate bilateral hydronephrosis and bladder wall thickening concerning for urothelial carcinoma - continue foley.  - would require definitive dx likely in cystoscopy with urology but after confirming GOC with HCPOA who patient designates to make his medical decisions, they are not interested in pursuing diagnosis and treatments that would be invasive. They do want to continue IV fluids and antibiotics.  - will follow for improvement, suspect he will indefinitely need foley catheter and consider comfort care   Type 2 diabetes mellitus with hyperglycemia, with long-term current use of insulin  (HCC)- not requiring insulin  on sliding scale so discontinued.  - continue to monitor CBGs with  poor PO intake. May need to restart insulin  if PO increases   Hyperkalemia- remains elevated. K+ 5.9>5.4 today. He is intermittently unable to tolerate lokelma  due to nausea.  Would not be HD candidate.  - giving d5 IVF with insulin  as well as albuterol  to help shift intracellular - antiemetics recommended to tolerate PO lokelma  and if unable to take PO, can transition to rectal administration.    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  Stable findings on CT    Dementia without behavioral disturbance (HCC) Delirium precautions   Chronic anemia At baseline  Body mass index is 22.63 kg/m.  VTE ppx: heparin  injection 5,000 Units Start: 02/01/24 0600  Diet:     Diet   Diet regular Fluid consistency: Thin   Consultants: None   Subjective 02/04/24    Pt reports no complaints. He denies abdominal pain currently. He falls asleep easily and unable to carry on conversation    Objective  Blood pressure (!) 104/56, pulse 66, temperature 98.7 F (37.1 C), resp. rate 16, height 5' 6 (1.676 m), weight 63.6 kg, SpO2 100%.  Intake/Output Summary (Last 24 hours) at 02/04/2024 0721 Last data filed at 02/04/2024 0658 Gross per 24 hour  Intake 720 ml  Output 1700 ml  Net -980 ml   Filed Weights   01/31/24 1817 01/31/24 2250 02/01/24 0309  Weight: 68.8 kg 68.8 kg 63.6 kg    Physical Exam:  General: awake briefly to voice, alert, NAD HEENT: atraumatic, clear conjunctiva, anicteric sclera, MMM, hard of hearing Respiratory: normal respiratory effort. Gastrointestinal: soft, NT, ND Nervous: Alert to voice Extremities: moves all equally, no edema, normal tone Skin: dry, intact, normal temperature, normal color. No rashes, lesions or ulcers on exposed skin  Labs   I have personally reviewed the following labs and imaging studies CBC    Component Value  Date/Time   WBC 4.3 02/04/2024 0507   RBC 2.53 (L) 02/04/2024 0507   HGB 7.2 (L) 02/04/2024 0507   HGB 15.1 09/29/2013 0350   HCT 22.2 (L) 02/04/2024 0507   HCT 42.9 09/29/2013 0350   PLT 68 (L) 02/04/2024 0507   PLT 127 (L) 09/29/2013 0350   MCV 87.7 02/04/2024 0507   MCV 83 09/29/2013 0350   MCH 28.5 02/04/2024 0507   MCHC 32.4 02/04/2024 0507   RDW 16.4 (H) 02/04/2024 0507   RDW 15.2 (H) 09/29/2013 0350   LYMPHSABS 0.5 (L) 10/07/2023 0815   LYMPHSABS 1.0 09/29/2013 0350   MONOABS 0.7 10/07/2023 0815   MONOABS 0.8 09/29/2013 0350   EOSABS 0.2 10/07/2023 0815   EOSABS 0.2 09/29/2013 0350   BASOSABS 0.0 10/07/2023 0815   BASOSABS 0.1 09/29/2013 0350      Latest Ref Rng & Units 02/04/2024    5:07 AM 02/03/2024    5:32 AM 02/02/2024    4:49 AM  BMP  Glucose 70 - 99 mg/dL 852  856  865   BUN 8 - 23 mg/dL 70  75  87   Creatinine 0.61 - 1.24 mg/dL 6.52  6.21  5.74   Sodium 135 - 145 mmol/L 137  136  135   Potassium 3.5 - 5.1 mmol/L 5.1  5.4  5.9   Chloride 98 - 111 mmol/L 109  111  111   CO2 22 - 32 mmol/L 21  21  21    Calcium  8.9 - 10.3 mg/dL 8.4  8.3  8.4     No results found.  Disposition Plan & Communication  Patient status: Inpatient  Admitted From: SNF Planned disposition location: Skilled nursing facility, hospice Anticipated discharge date: TBD pending clinical stability   Family Communication: caregiver on phone    Author: Marien LITTIE Piety, DO [facility] 02/04/2024, 7:21 AM   Available by Epic secure chat 7AM-7PM. If 7PM-7AM, please contact night-coverage.  [facility] contact information found on [email or web address].

## 2024-02-04 NOTE — Discharge Summary (Signed)
 Physician Discharge Summary  Patient: Peter Robertson FMW:[id number] DOB: 1940/12/02   Code Status: Limited: Do not attempt resuscitation (DNR) -DNR-LIMITED -Do Not Intubate/DNI  Admit date: 01/31/2024 Discharge date: 02/04/2024 Disposition: Skilled nursing facility, No home health services recommended PCP: System, Provider Not In  Recommendations for Outpatient Follow-up:  Follow up with PCP within 1-2 weeks Regarding general hospital follow up and preventative care Recommend BMP to monitor renal function and electrolytes. On day of dc K+ 5.1, Cr 3.47 Follow up with hospice If decide to pursue further management of urethral mass- will need urology referral   Discharge Diagnoses:  Principal Problem:   Acute kidney injury superimposed on stage 4 chronic kidney disease (HCC) Active Problems:   Abdominal pain   Bilateral hydronephrosis   Uncontrolled type 2 diabetes mellitus with hyperglycemia, with long-term current use of insulin  (HCC)   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)   Hyperkalemia   Chronic diastolic CHF (congestive heart failure) (HCC)   Paroxysmal atrial fibrillation (HCC)   Type II diabetes mellitus with renal manifestations (HCC)   Sarcoidosis   Dementia without behavioral disturbance (HCC)   BPH (benign prostatic hyperplasia)   Ureteropelvic junction (UPJ) obstruction   Hydronephrosis of left kidney s/p nephrostomy tubes (4/2 - 10/03/2022)   Bladder outlet obstruction, acute on chronic   Chronic anemia   Acute urinary retention   Pressure injury of skin  Brief Hospital Course Summary: Peter Robertson is a 83 y.o. male with medical history significant for HTN, DM asthma, CAD, CKD 4, dementia, dCHF, A-fib not on anticoagulants, history of hydronephrosis secondary to UPJ obstruction (nephrostomy tubes in April 2024), with prior UTIs, hospitalized in April with CHF and rapid A-fib, discharged on amiodarone  and hospice, now being admitted with abdominal  pain. In the ED, vitals within normal limits Labs notable for AKI with creatinine of 4.23 up from baseline of around 2.85 with potassium of 5.7 and bicarb of 20.  WBC was normal at 5.9.  Hemoglobin at baseline at 8.4.  Glucose elevated at 231.Lipase and LFTs unremarkable. Urine culture eventually resulted with no significant growth.  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  Patient treated with an LR bolus and Protonix . Patient was experiencing acute urinary retention and required a catheter placed which resulted in large volume urine output. The insertion was difficult and coude was used. Recommend this remain in. Due to the highly suspicious nature of cancer as cause of the urethral and bladder growths, GOC discussion was ongoing with caregiver and patient regarding desired workup. They decided to not pursue additional invasive testing and treatment.  He was continued with supportive care including antibiotics for UTI and his pain greatly improved. He did not require pain medications since 8/16 when he received tylenol .   Of note, he was intermittently weaned from O2 but had desaturations while sleeping. Would maintain his baseline 2L [place].   He is comfortable and stable to be discharged back to his facility.   All other chronic conditions were treated with home medications.    Discharge Condition: Stable, improved Recommended discharge diet: Regular healthy diet  Consultations: None   Procedures/Studies: Coude catheter insertion  Allergies as of 02/04/2024       Reactions   Sulfamethoxazole-trimethoprim Other (See Comments)   dizziness dizziness dizziness   Tape Rash   blisters        Medication List  STOP taking these medications    diltiazem  30 MG tablet Commonly known as: CARDIZEM    HumaLOG KwikPen 100 UNIT/ML KwikPen Generic drug:  insulin  lispro   insulin  glargine-yfgn 100 UNIT/ML injection Commonly known as: SEMGLEE    metoprolol  tartrate 25 MG tablet Commonly known as: LOPRESSOR        TAKE these medications    acetaminophen  325 MG tablet Commonly known as: TYLENOL  Take 2 tablets (650 mg total) by mouth every 6 (six) hours as needed for mild pain (pain score 1-3), fever or moderate pain (pain score 4-6) (or Fever >/= 101).   alum & mag hydroxide-simeth 200-200-20 MG/5ML suspension Commonly known as: MAALOX/MYLANTA Take 15 mLs by mouth every 6 (six) hours as needed for indigestion or heartburn.   amiodarone  200 MG tablet Commonly known as: PACERONE  Take 1 tablet (200 mg total) by mouth 2 (two) times daily.   cephALEXin  500 MG capsule Commonly known as: KEFLEX  Take 1 capsule (500 mg total) by mouth 2 (two) times daily for 3 days.   feeding supplement Liqd Take 237 mLs by mouth 3 (three) times daily between meals.   omeprazole 20 MG capsule Commonly known as: PRILOSEC Take 20 mg by mouth daily.   ondansetron  4 MG disintegrating tablet Commonly known as: ZOFRAN -ODT Take 4 mg by mouth every 6 (six) hours as needed for nausea or vomiting.   polyethylene glycol 17 g packet Commonly known as: MIRALAX  / GLYCOLAX  Take 17 g by mouth at bedtime.   promethazine 12.5 MG tablet Commonly known as: PHENERGAN Take 12.5 mg by mouth every 6 (six) hours as needed for nausea or vomiting.   sertraline  50 MG tablet Commonly known as: ZOLOFT  Take 1 tablet by mouth daily.   tamsulosin  0.4 MG Caps capsule Commonly known as: FLOMAX  Take 0.8 mg by mouth at bedtime.         Subjective   Pt reports feeling well. Denies abdominal pain. He states that he wishes he was going home.   All questions and concerns were addressed at time of discharge.  Objective  Blood pressure 136/78, pulse 67, temperature 98 F (36.7 C), resp. rate 18, height 5' 6 (1.676 m), weight 63.6 kg, SpO2 100%.   General: Pt is alert,  awake, not in acute distress Cardiovascular: RRR, S1/S2 +, no rubs, no gallops Respiratory: CTA bilaterally, no wheezing, no rhonchi Abdominal: Soft, NT, ND, bowel sounds + Extremities: no edema, no cyanosis  The results of significant diagnostics from this hospitalization (including imaging, microbiology, ancillary and laboratory) are listed below for reference.   Imaging studies: CT CHEST ABDOMEN PELVIS WO CONTRAST Result Date: 02/01/2024 CLINICAL DATA:  Cough, abdominal pain, nausea, vomiting EXAM: CT CHEST, ABDOMEN AND PELVIS WITHOUT CONTRAST TECHNIQUE: Multidetector CT imaging of the chest, abdomen and pelvis was performed following the standard protocol without IV contrast. RADIATION DOSE REDUCTION: This exam was performed according to the departmental dose-optimization program which includes automated exposure control, adjustment of the mA and/or kV according to patient size and/or use of iterative reconstruction technique. COMPARISON:  07/18/2023 FINDINGS: CT CHEST FINDINGS Cardiovascular: Aortic valvular calcification. Status post coronary artery bypass grafting. Global cardiac size iswithin normal limits. No pericardial effusion. Central pulmonary arteries are of normal caliber. Mild atherosclerotic calcification within the thoracic aorta. No aortic aneurysm. Mediastinum/Nodes: 16 mm nodule within the right thyroid gland is decreased in size since remote prior sonogram of 05/16/2021. No follow-up imaging is recommended. There is extensive calcified mediastinal adenopathy in keeping with changes of underlying sarcoidosis. The esophagus  is unremarkable. Lungs/Pleura: There is architectural distortion, masslike consolidation, and parenchymal calcification within the lung apices and within the subpleural regions at the lung bases and along major fissures in keeping with changes pulmonary sarcoidosis. The changes appear minimally progressed since remote prior examination of 05/21/2014. No active  inflammatory infiltrates are identified. No pneumothorax or pleural effusion. No central obstructing lesion. Musculoskeletal: No acute bone abnormality. No lytic or blastic bone lesion CT ABDOMEN PELVIS FINDINGS Hepatobiliary: No focal liver abnormality is seen. No gallstones, gallbladder wall thickening, or biliary dilatation. Pancreas: Parenchymal calcifications within the head and uncinate process of the pancreas are in keeping with changes of chronic pancreatitis. No superimposed peripancreatic inflammatory changes. Spleen: Mild splenomegaly noted with the spleen measuring 14.6 cm in greatest dimension, similar prior examination. No intrasplenic lesion seen. Adrenals/Urinary Tract: The adrenal glands are unremarkable. The kidneys are normal in size and position. Moderate bilateral hydronephrosis and hydroureter to the level the bladder. The bladder itself is relatively contracted demonstrates irregular wall thickening suspicious for a infiltrative intramural mass as can be seen with urothelial carcinoma. This appears progressive since prior examination. Stomach/Bowel: Stomach is within normal limits. Appendix appears normal. No evidence of bowel wall thickening, distention, or inflammatory changes. Vascular/Lymphatic: Aortic atherosclerosis. No enlarged abdominal or pelvic lymph nodes. Reproductive: Mild prostatic hypertrophy Other: Small bilateral fat containing inguinal hernias Musculoskeletal: Stable remote L1 compression deformity. No acute bone abnormality. No lytic or blastic bone lesion. Osseous structures are age appropriate. IMPRESSION: 1. No acute intrathoracic or intra-abdominal pathology identified. 2. Moderate bilateral hydronephrosis and hydroureter to the level the bladder. The bladder itself is relatively contracted demonstrates irregular wall thickening suspicious for a infiltrative intramural mass as can be seen with urothelial carcinoma. The irregularity of wall thickening would argue against  a more benign process such as chronic bladder outlet obstruction. This appears progressive since prior examination. Correlation with cystoscopy is recommended. 3. Changes of pulmonary sarcoidosis, minimally progressed since remote prior examination of 05/21/2014. 4. Aortic valvular calcification. Status post coronary artery bypass grafting. 5. Mild splenomegaly, similar to prior examination. 6. Changes of chronic pancreatitis. 7. Aortic atherosclerosis. Aortic Atherosclerosis (ICD10-I70.0). Electronically Signed   By: Dorethia Molt M.D.   On: 02/01/2024 00:52    Labs: Basic Metabolic Panel: Recent Labs  Lab 01/31/24 1814 02/01/24 0433 02/02/24 0449 02/03/24 0532 02/04/24 0507  NA 132* 136 135 136 137  K 5.7* 5.8* 5.9* 5.4* 5.1  CL 105 110 111 111 109  CO2 20* 21* 21* 21* 21*  GLUCOSE 231* 162* 134* 143* 147*  BUN 87* 88* 87* 75* 70*  CREATININE 4.23* 4.16* 4.25* 3.78* 3.47*  CALCIUM  8.7* 8.8* 8.4* 8.3* 8.4*  MG  --   --   --   --  1.9  PHOS  --   --   --   --  2.9   CBC: Recent Labs  Lab 01/31/24 1814 02/01/24 0433 02/02/24 0449 02/03/24 0532 02/04/24 0507  WBC 5.9 5.4 5.6 4.8 4.3  HGB 8.4* 8.2* 7.5* 7.2* 7.2*  HCT 25.7* 25.5* 23.0* 22.1* 22.2*  MCV 87.7 87.9 87.8 88.4 87.7  PLT 80* 77* 71* 67* 68*   Microbiology: Results for orders placed or performed during the hospital encounter of 01/31/24  Urine Culture (for pregnant, neutropenic or urologic patients or patients with an indwelling urinary catheter)     Status: None   Collection Time: 02/01/24 11:30 AM   Specimen: Urine, Clean Catch  Result Value Ref Range Status   Specimen Description  Final    URINE, CLEAN CATCH Performed at [facility], [address], [place], [place] [address]    Special Requests   Final    NONE Performed at [facility], [address], [place], [place] [address]    Culture   Final    NO GROWTH Performed at [facility], [address], [place], [place]  [address]    Report Status 02/02/2024 FINAL  Final    Time coordinating discharge: Over 30 minutes  Marien LITTIE Piety, MD  [facility] 02/04/2024, 9:52 AM

## 2024-02-04 NOTE — Progress Notes (Signed)
 PHARMACY CONSULT NOTE - ELECTROLYTES  Pharmacy Consult for Electrolyte Monitoring and Replacement   Recent Labs: Height: 5' 6 (167.6 cm) Weight: 63.6 kg (140 lb 3.4 oz) IBW/kg (Calculated) : 63.8 Estimated Creatinine Clearance: 14.3 mL/min (A) (by C-G formula based on SCr of 3.47 mg/dL (H)). Potassium (mmol/L)  Date Value  02/04/2024 5.1  09/29/2013 4.7   Magnesium  (mg/dL)  Date Value  91/81/7974 1.9  09/27/2013 1.8   Calcium  (mg/dL)  Date Value  91/81/7974 8.4 (L)   Calcium , Total (mg/dL)  Date Value  95/86/7984 9.2   Albumin  (g/dL)  Date Value  91/85/7974 3.2 (L)   Phosphorus (mg/dL)  Date Value  91/81/7974 2.9   Sodium (mmol/L)  Date Value  02/04/2024 137  09/29/2013 135 (L)   Assessment  Peter Robertson is a 83 y.o. male presenting with AKI on CKD and UTI. PMH significant for HTN, DM, Asthma, CAD, CKD4, dementia, dCHF, and A. Fib. Pharmacy has been consulted to monitor and replace electrolytes.   Diet: Regular, thin MIVF: N/A Pertinent medications: N/A  Goal of Therapy: Electrolytes WNL  Plan:  K 5.1: Provider has ordered lokelma  10 g PO x3 - last dose today at 1000 No electrolyte replacement indicated for today Check BMP, Mg, Phos with AM labs  Thank you for involving pharmacy in this patient's care.   Damien Napoleon, PharmD Clinical Pharmacist 02/04/2024 7:24 AM

## 2024-03-19 DEATH — deceased
